# Patient Record
Sex: Female | Born: 1950 | Race: Black or African American | Hispanic: No | Marital: Married | Smoking: Former smoker
Health system: Southern US, Community
[De-identification: ages and names within clinical notes are randomized; demographics above are authoritative.]

## PROBLEM LIST (undated history)

## (undated) DIAGNOSIS — J40 Bronchitis, not specified as acute or chronic: Secondary | ICD-10-CM

## (undated) DIAGNOSIS — C50919 Malignant neoplasm of unspecified site of unspecified female breast: Secondary | ICD-10-CM

## (undated) DIAGNOSIS — IMO0002 Reserved for concepts with insufficient information to code with codable children: Secondary | ICD-10-CM

## (undated) DIAGNOSIS — E119 Type 2 diabetes mellitus without complications: Secondary | ICD-10-CM

## (undated) DIAGNOSIS — G4730 Sleep apnea, unspecified: Secondary | ICD-10-CM

## (undated) DIAGNOSIS — K219 Gastro-esophageal reflux disease without esophagitis: Secondary | ICD-10-CM

## (undated) DIAGNOSIS — M1990 Unspecified osteoarthritis, unspecified site: Secondary | ICD-10-CM

## (undated) DIAGNOSIS — M8580 Other specified disorders of bone density and structure, unspecified site: Secondary | ICD-10-CM

## (undated) DIAGNOSIS — T7840XA Allergy, unspecified, initial encounter: Secondary | ICD-10-CM

## (undated) DIAGNOSIS — K449 Diaphragmatic hernia without obstruction or gangrene: Secondary | ICD-10-CM

## (undated) DIAGNOSIS — R011 Cardiac murmur, unspecified: Secondary | ICD-10-CM

## (undated) DIAGNOSIS — J189 Pneumonia, unspecified organism: Secondary | ICD-10-CM

## (undated) DIAGNOSIS — I509 Heart failure, unspecified: Secondary | ICD-10-CM

## (undated) DIAGNOSIS — I427 Cardiomyopathy due to drug and external agent: Secondary | ICD-10-CM

## (undated) DIAGNOSIS — C801 Malignant (primary) neoplasm, unspecified: Secondary | ICD-10-CM

## (undated) DIAGNOSIS — T451X5A Adverse effect of antineoplastic and immunosuppressive drugs, initial encounter: Secondary | ICD-10-CM

## (undated) DIAGNOSIS — J349 Unspecified disorder of nose and nasal sinuses: Secondary | ICD-10-CM

## (undated) DIAGNOSIS — C50911 Malignant neoplasm of unspecified site of right female breast: Secondary | ICD-10-CM

## (undated) HISTORY — DX: Diaphragmatic hernia without obstruction or gangrene: K44.9

## (undated) HISTORY — DX: Heart failure, unspecified: I50.9

## (undated) HISTORY — DX: Allergy, unspecified, initial encounter: T78.40XA

## (undated) HISTORY — DX: Reserved for concepts with insufficient information to code with codable children: IMO0002

## (undated) HISTORY — DX: Malignant neoplasm of unspecified site of unspecified female breast: C50.919

## (undated) HISTORY — PX: GASTRIC BYPASS: SHX52

## (undated) HISTORY — PX: JOINT REPLACEMENT: SHX530

## (undated) HISTORY — DX: Malignant neoplasm of unspecified site of right female breast: C50.911

## (undated) HISTORY — DX: Other specified disorders of bone density and structure, unspecified site: M85.80

## (undated) HISTORY — DX: Pneumonia, unspecified organism: J18.9

## (undated) HISTORY — DX: Cardiac murmur, unspecified: R01.1

## (undated) HISTORY — DX: Bronchitis, not specified as acute or chronic: J40

## (undated) HISTORY — PX: BUNIONECTOMY: SHX129

## (undated) HISTORY — PX: FOOT SURGERY: SHX648

## (undated) HISTORY — DX: Malignant (primary) neoplasm, unspecified: C80.1

## (undated) HISTORY — PX: TONSILLECTOMY: SUR1361

## (undated) HISTORY — PX: BREAST SURGERY: SHX581

## (undated) HISTORY — DX: Type 2 diabetes mellitus without complications: E11.9

## (undated) HISTORY — DX: Unspecified disorder of nose and nasal sinuses: J34.9

---

## 1997-02-17 HISTORY — PX: OTHER SURGICAL HISTORY: SHX169

## 1997-09-04 ENCOUNTER — Ambulatory Visit (HOSPITAL_COMMUNITY): Admission: RE | Admit: 1997-09-04 | Discharge: 1997-09-04 | Payer: Self-pay | Admitting: *Deleted

## 1998-12-04 ENCOUNTER — Encounter: Payer: Self-pay | Admitting: *Deleted

## 1998-12-04 ENCOUNTER — Ambulatory Visit (HOSPITAL_COMMUNITY): Admission: RE | Admit: 1998-12-04 | Discharge: 1998-12-04 | Payer: Self-pay | Admitting: *Deleted

## 2001-10-01 ENCOUNTER — Other Ambulatory Visit: Admission: RE | Admit: 2001-10-01 | Discharge: 2001-10-01 | Payer: Self-pay | Admitting: Obstetrics and Gynecology

## 2001-10-07 ENCOUNTER — Ambulatory Visit (HOSPITAL_COMMUNITY): Admission: RE | Admit: 2001-10-07 | Discharge: 2001-10-07 | Payer: Self-pay | Admitting: Obstetrics and Gynecology

## 2001-10-07 ENCOUNTER — Encounter: Payer: Self-pay | Admitting: Obstetrics and Gynecology

## 2001-10-13 ENCOUNTER — Encounter: Payer: Self-pay | Admitting: Obstetrics and Gynecology

## 2001-10-13 ENCOUNTER — Encounter: Admission: RE | Admit: 2001-10-13 | Discharge: 2001-10-13 | Payer: Self-pay | Admitting: Obstetrics and Gynecology

## 2001-12-01 ENCOUNTER — Encounter (INDEPENDENT_AMBULATORY_CARE_PROVIDER_SITE_OTHER): Payer: Self-pay | Admitting: Specialist

## 2001-12-01 ENCOUNTER — Ambulatory Visit (HOSPITAL_COMMUNITY): Admission: RE | Admit: 2001-12-01 | Discharge: 2001-12-01 | Payer: Self-pay | Admitting: Gastroenterology

## 2002-07-06 ENCOUNTER — Encounter: Admission: RE | Admit: 2002-07-06 | Discharge: 2002-10-04 | Payer: Self-pay | Admitting: Nurse Practitioner

## 2002-10-05 ENCOUNTER — Encounter: Admission: RE | Admit: 2002-10-05 | Discharge: 2003-01-03 | Payer: Self-pay | Admitting: Nurse Practitioner

## 2003-01-20 ENCOUNTER — Encounter: Admission: RE | Admit: 2003-01-20 | Discharge: 2003-04-20 | Payer: Self-pay | Admitting: *Deleted

## 2003-01-26 ENCOUNTER — Ambulatory Visit (HOSPITAL_COMMUNITY): Admission: RE | Admit: 2003-01-26 | Discharge: 2003-01-26 | Payer: Self-pay | Admitting: Obstetrics and Gynecology

## 2003-02-07 ENCOUNTER — Other Ambulatory Visit: Admission: RE | Admit: 2003-02-07 | Discharge: 2003-02-07 | Payer: Self-pay | Admitting: Obstetrics and Gynecology

## 2004-02-07 ENCOUNTER — Other Ambulatory Visit: Admission: RE | Admit: 2004-02-07 | Discharge: 2004-02-07 | Payer: Self-pay | Admitting: Obstetrics and Gynecology

## 2004-02-07 ENCOUNTER — Ambulatory Visit (HOSPITAL_COMMUNITY): Admission: RE | Admit: 2004-02-07 | Discharge: 2004-02-07 | Payer: Self-pay | Admitting: Obstetrics and Gynecology

## 2004-02-20 ENCOUNTER — Ambulatory Visit (HOSPITAL_COMMUNITY): Admission: RE | Admit: 2004-02-20 | Discharge: 2004-02-20 | Payer: Self-pay | Admitting: Obstetrics and Gynecology

## 2005-02-26 ENCOUNTER — Other Ambulatory Visit: Admission: RE | Admit: 2005-02-26 | Discharge: 2005-02-26 | Payer: Self-pay | Admitting: Obstetrics and Gynecology

## 2005-03-04 ENCOUNTER — Ambulatory Visit (HOSPITAL_COMMUNITY): Admission: RE | Admit: 2005-03-04 | Discharge: 2005-03-04 | Payer: Self-pay | Admitting: Obstetrics and Gynecology

## 2006-04-01 ENCOUNTER — Ambulatory Visit (HOSPITAL_COMMUNITY): Admission: RE | Admit: 2006-04-01 | Discharge: 2006-04-01 | Payer: Self-pay | Admitting: Obstetrics and Gynecology

## 2006-06-01 ENCOUNTER — Ambulatory Visit (HOSPITAL_COMMUNITY): Admission: RE | Admit: 2006-06-01 | Discharge: 2006-06-01 | Payer: Self-pay | Admitting: Obstetrics and Gynecology

## 2006-07-16 ENCOUNTER — Encounter: Admission: RE | Admit: 2006-07-16 | Discharge: 2006-07-16 | Payer: Self-pay | Admitting: Internal Medicine

## 2006-08-25 ENCOUNTER — Encounter: Admission: RE | Admit: 2006-08-25 | Discharge: 2006-08-25 | Payer: Self-pay | Admitting: Orthopedic Surgery

## 2006-09-04 ENCOUNTER — Encounter: Admission: RE | Admit: 2006-09-04 | Discharge: 2006-09-04 | Payer: Self-pay | Admitting: Orthopedic Surgery

## 2006-10-01 ENCOUNTER — Encounter: Admission: RE | Admit: 2006-10-01 | Discharge: 2006-11-25 | Payer: Self-pay | Admitting: Orthopedic Surgery

## 2007-04-06 ENCOUNTER — Ambulatory Visit (HOSPITAL_COMMUNITY): Admission: RE | Admit: 2007-04-06 | Discharge: 2007-04-06 | Payer: Self-pay | Admitting: Obstetrics and Gynecology

## 2008-03-22 ENCOUNTER — Encounter: Admission: RE | Admit: 2008-03-22 | Discharge: 2008-03-22 | Payer: Self-pay | Admitting: Internal Medicine

## 2008-05-02 ENCOUNTER — Ambulatory Visit (HOSPITAL_COMMUNITY): Admission: RE | Admit: 2008-05-02 | Discharge: 2008-05-02 | Payer: Self-pay | Admitting: Obstetrics and Gynecology

## 2009-05-08 ENCOUNTER — Ambulatory Visit (HOSPITAL_COMMUNITY): Admission: RE | Admit: 2009-05-08 | Discharge: 2009-05-08 | Payer: Self-pay | Admitting: Obstetrics and Gynecology

## 2010-04-12 ENCOUNTER — Other Ambulatory Visit (HOSPITAL_COMMUNITY): Payer: Self-pay | Admitting: Internal Medicine

## 2010-04-12 DIAGNOSIS — Z1231 Encounter for screening mammogram for malignant neoplasm of breast: Secondary | ICD-10-CM

## 2010-04-16 ENCOUNTER — Emergency Department (HOSPITAL_COMMUNITY): Payer: BC Managed Care – PPO

## 2010-04-16 ENCOUNTER — Inpatient Hospital Stay (HOSPITAL_COMMUNITY)
Admission: EM | Admit: 2010-04-16 | Discharge: 2010-04-25 | DRG: 170 | Disposition: A | Discharge status: Home Health | Payer: BC Managed Care – PPO | Source: Ambulatory Visit | Attending: Surgery | Admitting: Surgery

## 2010-04-16 ENCOUNTER — Inpatient Hospital Stay (HOSPITAL_COMMUNITY): Payer: BC Managed Care – PPO

## 2010-04-16 DIAGNOSIS — E119 Type 2 diabetes mellitus without complications: Secondary | ICD-10-CM | POA: Diagnosis present

## 2010-04-16 DIAGNOSIS — I959 Hypotension, unspecified: Secondary | ICD-10-CM | POA: Diagnosis present

## 2010-04-16 DIAGNOSIS — K560 Paralytic ileus: Secondary | ICD-10-CM | POA: Diagnosis present

## 2010-04-16 DIAGNOSIS — E785 Hyperlipidemia, unspecified: Secondary | ICD-10-CM | POA: Diagnosis present

## 2010-04-16 DIAGNOSIS — I10 Essential (primary) hypertension: Secondary | ICD-10-CM | POA: Diagnosis present

## 2010-04-16 DIAGNOSIS — K275 Chronic or unspecified peptic ulcer, site unspecified, with perforation: Principal | ICD-10-CM | POA: Diagnosis present

## 2010-04-16 DIAGNOSIS — E669 Obesity, unspecified: Secondary | ICD-10-CM | POA: Diagnosis present

## 2010-04-16 LAB — POCT I-STAT 7, (LYTES, BLD GAS, ICA,H+H)
Acid-base deficit: 1 mmol/L (ref 0.0–2.0)
Bicarbonate: 24.7 mEq/L — ABNORMAL HIGH (ref 20.0–24.0)
Calcium, Ion: 1.12 mmol/L (ref 1.12–1.32)
HCT: 37 % (ref 36.0–46.0)
Hemoglobin: 12.6 g/dL (ref 12.0–15.0)
O2 Saturation: 100 %
Potassium: 3.2 mEq/L — ABNORMAL LOW (ref 3.5–5.1)
Sodium: 142 mEq/L (ref 135–145)
TCO2: 26 mmol/L (ref 0–100)
pCO2 arterial: 46.2 mmHg — ABNORMAL HIGH (ref 35.0–45.0)
pH, Arterial: 7.336 — ABNORMAL LOW (ref 7.350–7.400)
pO2, Arterial: 433 mmHg — ABNORMAL HIGH (ref 80.0–100.0)

## 2010-04-16 LAB — DIFFERENTIAL
Basophils Absolute: 0 10*3/uL (ref 0.0–0.1)
Basophils Relative: 0 % (ref 0–1)
Eosinophils Absolute: 0.1 10*3/uL (ref 0.0–0.7)
Eosinophils Relative: 1 % (ref 0–5)
Lymphocytes Relative: 27 % (ref 12–46)
Lymphs Abs: 2.1 10*3/uL (ref 0.7–4.0)
Monocytes Absolute: 0.3 10*3/uL (ref 0.1–1.0)
Monocytes Relative: 4 % (ref 3–12)
Neutro Abs: 5.3 10*3/uL (ref 1.7–7.7)
Neutrophils Relative %: 68 % (ref 43–77)

## 2010-04-16 LAB — CBC
HCT: 46 % (ref 36.0–46.0)
HCT: 40.6 % (ref 36.0–46.0)
Hemoglobin: 15.5 g/dL — ABNORMAL HIGH (ref 12.0–15.0)
Hemoglobin: 14 g/dL (ref 12.0–15.0)
MCH: 30.3 pg (ref 26.0–34.0)
MCH: 30.3 pg (ref 26.0–34.0)
MCHC: 33.7 g/dL (ref 30.0–36.0)
MCHC: 34.5 g/dL (ref 30.0–36.0)
MCV: 90 fL (ref 78.0–100.0)
MCV: 87.9 fL (ref 78.0–100.0)
Platelets: 272 10*3/uL (ref 150–400)
Platelets: 205 10*3/uL (ref 150–400)
RBC: 5.11 MIL/uL (ref 3.87–5.11)
RBC: 4.62 MIL/uL (ref 3.87–5.11)
RDW: 12.5 % (ref 11.5–15.5)
RDW: 12.8 % (ref 11.5–15.5)
WBC: 7.8 10*3/uL (ref 4.0–10.5)
WBC: 6.7 10*3/uL (ref 4.0–10.5)

## 2010-04-16 LAB — GLUCOSE, CAPILLARY
Glucose-Capillary: 196 mg/dL — ABNORMAL HIGH (ref 70–99)
Glucose-Capillary: 94 mg/dL (ref 70–99)
Glucose-Capillary: 185 mg/dL — ABNORMAL HIGH (ref 70–99)
Glucose-Capillary: 165 mg/dL — ABNORMAL HIGH (ref 70–99)

## 2010-04-16 LAB — POCT CARDIAC MARKERS
CKMB, poc: <1 ng/mL — ABNORMAL LOW (ref 1.0–8.0)
Myoglobin, poc: 28.4 ng/mL (ref 12–200)
Troponin i, poc: <0.05 ng/mL (ref 0.00–0.09)

## 2010-04-16 LAB — BASIC METABOLIC PANEL
BUN: 9 mg/dL (ref 6–23)
CO2: 23 mEq/L (ref 19–32)
Calcium: 7.4 mg/dL — ABNORMAL LOW (ref 8.4–10.5)
Chloride: 113 mEq/L — ABNORMAL HIGH (ref 96–112)
Creatinine, Ser: 0.49 mg/dL (ref 0.4–1.2)
GFR calc Af Amer: >60 mL/min (ref >60)
GFR calc non Af Amer: >60 mL/min (ref >60)
Glucose, Bld: 146 mg/dL — ABNORMAL HIGH (ref 70–99)
Potassium: 3.7 mEq/L (ref 3.5–5.1)
Sodium: 139 mEq/L (ref 135–145)

## 2010-04-16 LAB — MRSA PCR SCREENING: MRSA by PCR: NEGATIVE

## 2010-04-16 LAB — COMPREHENSIVE METABOLIC PANEL
ALT: 26 U/L (ref 0–35)
AST: 26 U/L (ref 0–37)
Albumin: 3.2 g/dL — ABNORMAL LOW (ref 3.5–5.2)
Alkaline Phosphatase: 75 U/L (ref 39–117)
BUN: 12 mg/dL (ref 6–23)
CO2: 30 mEq/L (ref 19–32)
Calcium: 8.7 mg/dL (ref 8.4–10.5)
Chloride: 109 mEq/L (ref 96–112)
Creatinine, Ser: 0.77 mg/dL (ref 0.4–1.2)
GFR calc Af Amer: >60 mL/min (ref >60)
GFR calc non Af Amer: >60 mL/min (ref >60)
Glucose, Bld: 197 mg/dL — ABNORMAL HIGH (ref 70–99)
Potassium: 4.4 mEq/L (ref 3.5–5.1)
Sodium: 146 mEq/L — ABNORMAL HIGH (ref 135–145)
Total Bilirubin: 0.5 mg/dL (ref 0.3–1.2)
Total Protein: 5.5 g/dL — ABNORMAL LOW (ref 6.0–8.3)

## 2010-04-16 LAB — LIPASE, BLOOD: Lipase: 23 U/L (ref 11–59)

## 2010-04-16 LAB — APTT: aPTT: 27 seconds (ref 24–37)

## 2010-04-16 LAB — ABO/RH: ABO/RH(D): A POS

## 2010-04-16 LAB — LACTIC ACID, PLASMA: Lactic Acid, Venous: 4.7 mmol/L — ABNORMAL HIGH (ref 0.5–2.2)

## 2010-04-16 LAB — PROTIME-INR
INR: 1.09 (ref 0.00–1.49)
Prothrombin Time: 14.3 seconds (ref 11.6–15.2)

## 2010-04-17 LAB — BASIC METABOLIC PANEL
BUN: 7 mg/dL (ref 6–23)
CO2: 28 mEq/L (ref 19–32)
Calcium: 7.9 mg/dL — ABNORMAL LOW (ref 8.4–10.5)
Chloride: 107 mEq/L (ref 96–112)
Creatinine, Ser: 0.5 mg/dL (ref 0.4–1.2)
GFR calc Af Amer: >60 mL/min (ref >60)
GFR calc non Af Amer: >60 mL/min (ref >60)
Glucose, Bld: 140 mg/dL — ABNORMAL HIGH (ref 70–99)
Potassium: 3.3 mEq/L — ABNORMAL LOW (ref 3.5–5.1)
Sodium: 140 mEq/L (ref 135–145)

## 2010-04-17 LAB — HEMOGLOBIN A1C
Hgb A1c MFr Bld: 5.9 % — ABNORMAL HIGH (ref <5.7)
Mean Plasma Glucose: 123 mg/dL — ABNORMAL HIGH (ref <117)

## 2010-04-17 LAB — GLUCOSE, CAPILLARY
Glucose-Capillary: 124 mg/dL — ABNORMAL HIGH (ref 70–99)
Glucose-Capillary: 128 mg/dL — ABNORMAL HIGH (ref 70–99)
Glucose-Capillary: 112 mg/dL — ABNORMAL HIGH (ref 70–99)
Glucose-Capillary: 99 mg/dL (ref 70–99)
Glucose-Capillary: 144 mg/dL — ABNORMAL HIGH (ref 70–99)
Glucose-Capillary: 84 mg/dL (ref 70–99)

## 2010-04-17 LAB — CBC
HCT: 38.3 % (ref 36.0–46.0)
Hemoglobin: 13.2 g/dL (ref 12.0–15.0)
MCH: 30.6 pg (ref 26.0–34.0)
MCHC: 34.5 g/dL (ref 30.0–36.0)
MCV: 88.7 fL (ref 78.0–100.0)
Platelets: 209 10*3/uL (ref 150–400)
RBC: 4.32 MIL/uL (ref 3.87–5.11)
RDW: 13.3 % (ref 11.5–15.5)
WBC: 10.5 10*3/uL (ref 4.0–10.5)

## 2010-04-17 NOTE — Op Note (Signed)
NAME:  [name] [name], [name] [name]            ACCOUNT NO.:  [id number]  MEDICAL RECORD NO.:  [id number]           PATIENT TYPE:  I  LOCATION:  2550                         FACILITY:  [facility]  PHYSICIAN:  Angelia Mould. Derrell Lolling, M.D.DATE OF BIRTH:  12-Apr-1950  DATE OF PROCEDURE:  04/16/2010 DATE OF DISCHARGE:                              OPERATIVE REPORT   PREOPERATIVE DIAGNOSIS:  Pneumoperitoneum with peritonitis.  POSTOPERATIVE DIAGNOSES: 1. Perforated marginal ulcer with peritonitis. 2. 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 3. status post Roux-en-Y gastric bypass for morbid obesity  OPERATION PERFORMED: 1. Exploratory laparotomy with closure and omental patch of perforated     marginal ulcer. 2. A 22-French feeding gastrostomy (into gastric remnant). 3. Upper endoscopy (by Dr. Ovidio Kin).  SURGEON:  Angelia Mould. Derrell Lolling, MD.  FIRST ASSISTANT:  Almond Lint, MD.  OPERATIVE INDICATIONS:  This is a 60 year old African American female who has a history of a Roux-en-Y gastric bypass for morbid obesity about 2 years ago by Dr. Leona Carry at [facility].  She has done well with that surgery.  She has had one or two endoscopies with dilatation of the anastomosis according to the husband, but she has not really had any recent vomiting.  She became acutely ill this morning with a sudden onset of left-sided abdominal pain and came to the emergency room in extreme distress from pain and was diaphoretic.  She was initially stable; but despite fluid resuscitation, she became somewhat hypotensive and it was thought that this was possibly due to impending sepsis.  The CT scan was done which showed a pneumoperitoneum, moderate amount of air but did not specifically document the site of perforation as noted and oral contrast was given.  I was called at this point in time and the patient was taken emergently to the operating room.  OPERATIVE FINDINGS:  The patient had a perforation on the  anterior wall of the gastrojejunostomy.  This was stuck up onto the liver and it required dissection off of the undersurface of the left lobe of the liver very carefully.  We did have some injury to the capsule of the liver causing bleeding, but we ultimately had that controlled quite nicely.  I was able to pass a 24-French red rubber catheter through the opening up into the gastric pouch and then down into the jejunal limb. There was a lot of cloudy, dirty looking fluid throughout the abdomen and pelvis which required about 8 or 10 L of irrigation to clean up. Dr. Ezzard Standing performed an upper endoscopy, which will be dictated separately.  He felt that there was probably a stricture at the gastric jejunal anastomosis, perhaps just proximal to the perforation.  I could not appreciate this from the abdominal side.  Because of the stricture, I felt that it was prudent to put in a feeding gastrostomy in the gastric remnant.  We did not feel that she was stable enough to consider complete revision of the gastrojejunostomy.  The liver otherwise looked healthy.  The gallbladder was distended but looked healthy.  The small bowel looked healthy.  There was no evidence of any internal hernia. The jejunojejunostomy looked healthy.  OPERATIVE TECHNIQUE:  Following induction of general endotracheal anesthesia, multiple IV and arterial lines were placed by Dr. Sampson Goon. The abdomen was prepped and draped in sterile fashion.  Surgical time- out was held identifying correct patient and correct procedure.  Upper midline laparotomy incision was made.  The abdomen was entered and explored with findings as described above.  Self-retaining retractor were placed.  Aerobic and anaerobic cultures were taken of the abdominal fluid.  We first identified the jejunojejunostomy that looked fine.  I then went up on the Roux-en-Y limb and found the perforation on the anterior surface of the gastrojejunostomy.  This  was stuck to the undersurface of the left lobe of the liver just above the perforation; and using scissor dissection and cautery dissection, I had to slowly take this down.  In an effort to avoid injury to the GI tract, I did enter the capsule of liver some, this was controlled with cautery, Vicryl suture, and ultimately Tisseel.  There was no bleeding at the end of the case.  After irrigating the abdomen somewhat, I then closed the perforation transversely with interrupted sutures of 2-0 silk.  Dr. Ezzard Standing came in to the operating room to perform an upper endoscopy.  He insufflated this under air with occlusion of the jejunal limb.  We did have a couple of air bubbles in a couple of areas, so we put a couple of more sutures and that seemed to control things quite nicely.  We very carefully placed some sutures back in the angle to the right of the gastrojejunal anastomosis.  We had good airtight seal at this point.  After discussing her care, we elected to go ahead with a feeding gastrostomy.  We could find the gastric remnant coming up behind the anastomosis on the left upper quadrant.  We carefully identified this. We pulled the transverse colon and splenic flexure down and away from this.  We brought a 22-French Foley catheter through the left upper quadrant abdominal wall and placed it into the gastric lumen, and I placed two concentric pursestring sutures of 2-0 silk.  We then blew up the Foley balloon and it worked fine.  It had been tested prior to insertion.  We then tacked the stomach up to the abdominal wall with about 6 interrupted sutures of 3-0 silk.  We then pulled the balloon up and then sewed the Foley catheter to the skin with a #1 silk suture.  We flushed the catheter and it flushed easily.  It was occluded and then later connected to gravity drainage bag at the end of the case.  We further irrigated the subphrenic spaces, subhepatic spaces, abdomen and pelvis with a  few more liters of saline.  We then checked the liver and there was no bleeding.  We placed Tisseel all around the anastomosis and let that harden and after it had hardened, we then took a tongue of omentum and placed it over the repair and tacked it in place with several interrupted sutures of 3-0 silk.  We took two 19-French Blake drains and brought them to the operative field.  There were brought out through stab incisions in the right upper quadrant.  The upper drain was placed to the right of the repair and down into the recess behind that. The lower drain was placed under the liver and across the repair anteriorly and extended to the left a little bit.  These were sutured to the skin with nylon sutures connected to suction bulbs.  After checking one more time, we found no leak and no bleeding.  The colon and omentum returned to their anatomic positions.  The midline fascia was closed with a running suture of #1 double-stranded PDS.  The skin was closed loosely with skin staples placing Telfa wicks in-between.  Clean bandages were placed.  The patient was taken to recovery room in stable condition.  ESTIMATED BLOOD LOSS:  About 300 mL.  COMPLICATIONS:  None.  Sponge, needle, and instruments counts were correct.     Angelia Mould. Derrell Lolling, M.D. HMI/MEDQ  D:  04/16/2010  T:  04/17/2010  Job:  [id number]  cc:   Sharlot Gowda, M.D. Leona Carry  Electronically Signed by Claud Kelp M.D. on 04/17/2010 01:26:47 PM

## 2010-04-18 LAB — GLUCOSE, CAPILLARY
Glucose-Capillary: 105 mg/dL — ABNORMAL HIGH (ref 70–99)
Glucose-Capillary: 119 mg/dL — ABNORMAL HIGH (ref 70–99)
Glucose-Capillary: 133 mg/dL — ABNORMAL HIGH (ref 70–99)
Glucose-Capillary: 135 mg/dL — ABNORMAL HIGH (ref 70–99)
Glucose-Capillary: 150 mg/dL — ABNORMAL HIGH (ref 70–99)
Glucose-Capillary: 152 mg/dL — ABNORMAL HIGH (ref 70–99)
Glucose-Capillary: 442 mg/dL — ABNORMAL HIGH (ref 70–99)

## 2010-04-18 LAB — BASIC METABOLIC PANEL
BUN: 4 mg/dL — ABNORMAL LOW (ref 6–23)
CO2: 30 mEq/L (ref 19–32)
Calcium: 8.5 mg/dL (ref 8.4–10.5)
Chloride: 102 mEq/L (ref 96–112)
Creatinine, Ser: 0.54 mg/dL (ref 0.4–1.2)
GFR calc Af Amer: >60 mL/min (ref >60)
GFR calc non Af Amer: >60 mL/min (ref >60)
Glucose, Bld: 121 mg/dL — ABNORMAL HIGH (ref 70–99)
Potassium: 3.6 mEq/L (ref 3.5–5.1)
Sodium: 139 mEq/L (ref 135–145)

## 2010-04-18 LAB — CBC
HCT: 36.5 % (ref 36.0–46.0)
Hemoglobin: 12.4 g/dL (ref 12.0–15.0)
MCH: 30.7 pg (ref 26.0–34.0)
MCHC: 34 g/dL (ref 30.0–36.0)
MCV: 90.3 fL (ref 78.0–100.0)
Platelets: 202 10*3/uL (ref 150–400)
RBC: 4.04 MIL/uL (ref 3.87–5.11)
RDW: 13.5 % (ref 11.5–15.5)
WBC: 12 10*3/uL — ABNORMAL HIGH (ref 4.0–10.5)

## 2010-04-18 LAB — MRSA PCR SCREENING: MRSA by PCR: NEGATIVE

## 2010-04-19 ENCOUNTER — Inpatient Hospital Stay: Admit: 2010-04-19 | Payer: Self-pay | Admitting: Internal Medicine

## 2010-04-19 LAB — CBC
HCT: 33.9 % — ABNORMAL LOW (ref 36.0–46.0)
Hemoglobin: 11.3 g/dL — ABNORMAL LOW (ref 12.0–15.0)
MCH: 29.8 pg (ref 26.0–34.0)
MCHC: 33.3 g/dL (ref 30.0–36.0)
MCV: 89.4 fL (ref 78.0–100.0)
Platelets: 208 10*3/uL (ref 150–400)
RBC: 3.79 MIL/uL — ABNORMAL LOW (ref 3.87–5.11)
RDW: 12.9 % (ref 11.5–15.5)
WBC: 9.1 10*3/uL (ref 4.0–10.5)

## 2010-04-19 LAB — TYPE AND SCREEN
ABO/RH(D): A POS
Antibody Screen: NEGATIVE
Unit division: 0
Unit division: 0
Unit division: 0
Unit division: 0
Unit division: 0
Unit division: 0

## 2010-04-19 LAB — PREALBUMIN: Prealbumin: 9.7 mg/dL — ABNORMAL LOW (ref 17.0–34.0)

## 2010-04-19 LAB — BASIC METABOLIC PANEL
BUN: 4 mg/dL — ABNORMAL LOW (ref 6–23)
CO2: 31 mEq/L (ref 19–32)
Calcium: 8.4 mg/dL (ref 8.4–10.5)
Chloride: 104 mEq/L (ref 96–112)
Creatinine, Ser: 0.51 mg/dL (ref 0.4–1.2)
GFR calc Af Amer: >60 mL/min (ref >60)
GFR calc non Af Amer: >60 mL/min (ref >60)
Glucose, Bld: 140 mg/dL — ABNORMAL HIGH (ref 70–99)
Potassium: 3.6 mEq/L (ref 3.5–5.1)
Sodium: 138 mEq/L (ref 135–145)

## 2010-04-19 LAB — GLUCOSE, CAPILLARY
Glucose-Capillary: 140 mg/dL — ABNORMAL HIGH (ref 70–99)
Glucose-Capillary: 121 mg/dL — ABNORMAL HIGH (ref 70–99)
Glucose-Capillary: 102 mg/dL — ABNORMAL HIGH (ref 70–99)

## 2010-04-19 NOTE — H&P (Signed)
NAME:  [name] [name]            ACCOUNT NO.:  [id number]  MEDICAL RECORD NO.:  [id number]           PATIENT TYPE:  I  LOCATION:  2106                         FACILITY:  [facility]  PHYSICIAN:  [name] [name], M.D.DATE OF BIRTH:  1950/08/18  DATE OF ADMISSION:  04/16/2010 DATE OF DISCHARGE:                             HISTORY & PHYSICAL   CHIEF COMPLAINT:  Abdominal pain.  HISTORY OF PRESENT ILLNESS:  The patient is a 60 year old African American female who woke up early this morning, not feeling well, went to use the bathroom, and essentially doubled over with complaint of abdominal pain.  This was associated by feeling of weakness and lethargy which had been present although to a lesser degree over the past couple of days.  She has not had any vomiting though she has had some nausea. She has not had any change in her bowel movements, her bowel movements were normal yesterday.  No hematochezia or melena.  She denies any trauma or recent travel history or abnormal ingestions.  Of note, she does have a history of Roux-en-Y gastric bypass in 2010, at [facility], and she had subsequent dilatations of her anastomosis, most recently approximately 1 year ago.  She has otherwise done very well, losing a significant amount of weight, and has tolerated the procedure well postoperatively.  She, however, presented to the emergency department with complaint of this sudden abdominal pain with weakness and diaphoresis.  She was immediately sent to the CT scan for concern of ruptured aorta or aortic aneurysm.  However, the CT scan showed no evidence of aortic rupture but showed evidence of intraabdominal free air, suspicious for perforated viscus, predominately in the upper abdomen.  The patient denies any known history of peptic ulcer disease, states there was no mention of any gastritis or esophagitis on her most recent endoscopy done at [facility].  She has also had recent lower  endoscopy by Dr. Charna [name] in 2003, and again as well as in last year.  She has no known history of diverticulum of note.  We have been asked to see this patient in the emergency department for perforated viscus.  PAST MEDICAL HISTORY:  Consistent with diabetes, hypertension, hyperlipidemia, obesity.  PAST SURGICAL HISTORY:  Consistent with Roux-en-Y gastric bypass at [facility].  She has also had a C-section.  FAMILY HISTORY:  Noncontributory to the present case.  SOCIAL HISTORY:  The patient denies any tobacco or alcohol use.  She is married and lives here in town.  DRUG ALLERGIES:  Include PENICILLIN and DARVOCET.  CURRENT MEDICATION:  List is incomplete at present time but it includes Crestor, aspirin 81 mg daily, and diabetes medications which the husband did not know the name of currently.  REVIEW OF SYSTEMS:  Please see history of present illness for pertinent findings.  PHYSICAL EXAMINATION:  GENERAL:  Reveals a 60 year old African American female who is pale and diaphoretic and certainly appears ill, but nontoxic. VITAL SIGNS:  She has a temperature of 97.3, heart rate stable in the 80s, respiratory rate of 20, blood pressure initially 100/57, currently dropped to 78/60. HEENT:  Unremarkable except pale  mucosa and conjunctivae. NECK:  Supple without lymphadenopathy. CHEST:  The patient's lungs are clear.  No wheezes, rhonchi, or rales. The patient has mildly labored breathing. HEART:  Regular rate and rhythm.  No murmurs, gallops, or rubs. Carotids are 2+ and brisk without bruit. ABDOMEN:  Tender, firm but not rigid.  No mass effect, hernias, or organomegaly is appreciated.  The patient is primarily tender in the left upper quadrant, midepigastric. RECTAL:  Deferred. GENITOURINARY:  Deferred. EXTREMITIES:  Good active range of motion without crepitus.  Normal muscle strength and tone. SKIN:  Otherwise pale, cool to touch, and clammy.  Capillary  refill though is brisk. NEUROLOGIC:  The patient is alert and oriented x3.  DIAGNOSTICS:  Labs to this point show white blood cell count of 7.8, hemoglobin of 15.8, hematocrit of 46, platelet count of 272.  CT scan again shows evidence of moderate amount of free intraperitoneal air with edema at the gastrojejunostomy site, suspicious for perforation of ulceration at that site.  There is a minor amount of free fluid throughout the peritoneal cavity, but is not suspicious for bleeding at this time.  IMPRESSION: 1. Acute abdominal pain secondary to perforated viscus. 2. Likely perforated peptic ulcer disease.  PLAN:  We will admit the patient.  She is to get aggressive IV fluid resuscitation while in the emergency room.  We will check her coags and additional labs and plan for operative intervention.  The need for surgery including the procedure risks, postoperative complications, and expectations was discussed at length with the husband and other family members who were present.     [name] El, PA-C   ______________________________ [name] [name], M.D.    KB/MEDQ  D:  04/16/2010  T:  04/16/2010  Job:  [id number]  Electronically Signed by [name] [name]  on 04/17/2010 02:23:15 PM Electronically Signed by Claud Kelp M.D. on 04/19/2010 09:38:40 AM

## 2010-04-19 NOTE — Op Note (Signed)
  NAME:  [name], [name]            ACCOUNT NO.:  [id number]  MEDICAL RECORD NO.:  [id number]           PATIENT TYPE:  I  LOCATION:  2106                         FACILITY:  [facility]  PHYSICIAN:  Sandria Bales. Ezzard Standing, M.D.  DATE OF BIRTH:  Dec 26, 1950  DATE OF PROCEDURE:  04/16/2010                              OPERATIVE REPORT   PREOPERATIVE DIAGNOSIS:  Pneumoperitoneum, history of Roux-en-Y gastric bypass (performed at [facility]).  PREOPERATIVE DIAGNOSIS:  Marginal ulcer at gastrojejunostomy with perforation.  Gastrojejunal stenosis/stricture.  PROCEDURE:  Esophagogastroscopy.  SURGEON:  Sandria Bales. Ezzard Standing, MD  ASSISTANT:  No first assistant.  ANESTHESIA:  General endotracheal.  ESTIMATED BLOOD LOSS:  None.  INDICATIONS FOR PROCEDURE:  [name] is a 60 year old female who had a Roux-en-Y gastric bypass by Dr. Leona Carry at [facility] approximately 2 years ago.  She apparently has been endoscoped  once or twice since the surgery for a stricture at her gastrojejunostomy.  It is unclear whether she has undergone a dilatation or been treated for ulcer disease, but she presented acutely with abdominal pain to the [facility] emergency room.  She was evaluated by my partner Dr. Claud Kelp for a pneumoperitoneum.  Dr. Derrell Lolling is doing an open laparotomy and found a perforation at the gastrojejunostomy.  I am doing an upper endoscopy to evaluate the size of the pouch, the gastrojejunostomy, and the anatomy.  OPERATIVE NOTE: Dr. Derrell Lolling was clamped off the small bowel and I passed the scope down into this gastric pouch. I identified the Z-line at about 40 cm from the incisors. The gastrojejunal anastomosis at about 47 cm from the incisors for a 7-cm pouch.  The anastomosis is small, probably 6 or 7 mm in diameter.  This is consistent with a stricture at the anastomosis.  Dr. Derrell Lolling saw some bubbling while I was doing the endoscopy, so he oversewed the perforation to where there was no  bubbling.    Though she has a stenosis, this is not the time to try to dilate the gastrojejunostomy.  The mucosa of the stomach looked okay.  The pouch diameter looks okay, through the pouch length is about 7-cm pouch.    I then took photos which were placed in the chart.  I withdrew the endoscope into the esophagus, which was unremarkable.  The patient tolerated my portion of the operation.  Dr. Derrell Lolling will dictate the laparotomy portion of the operation.   Sandria Bales. Ezzard Standing, M.D., FACS   DHN/MEDQ  D:  04/16/2010  T:  04/17/2010  Job:  [id number]  cc:   Angelia Mould. Derrell Lolling, M.D. Leona Carry  Electronically Signed by Ovidio Kin M.D. on 04/19/2010 09:32:31 PM

## 2010-04-20 LAB — BODY FLUID CULTURE

## 2010-04-20 LAB — GLUCOSE, CAPILLARY
Glucose-Capillary: 102 mg/dL — ABNORMAL HIGH (ref 70–99)
Glucose-Capillary: 106 mg/dL — ABNORMAL HIGH (ref 70–99)
Glucose-Capillary: 109 mg/dL — ABNORMAL HIGH (ref 70–99)
Glucose-Capillary: 134 mg/dL — ABNORMAL HIGH (ref 70–99)
Glucose-Capillary: 141 mg/dL — ABNORMAL HIGH (ref 70–99)
Glucose-Capillary: 144 mg/dL — ABNORMAL HIGH (ref 70–99)
Glucose-Capillary: 148 mg/dL — ABNORMAL HIGH (ref 70–99)
Glucose-Capillary: 93 mg/dL (ref 70–99)
Glucose-Capillary: 99 mg/dL (ref 70–99)

## 2010-04-21 LAB — CBC
HCT: 34.6 % — ABNORMAL LOW (ref 36.0–46.0)
Hemoglobin: 11.9 g/dL — ABNORMAL LOW (ref 12.0–15.0)
MCH: 30.5 pg (ref 26.0–34.0)
MCHC: 34.4 g/dL (ref 30.0–36.0)
MCV: 88.7 fL (ref 78.0–100.0)
Platelets: 244 10*3/uL (ref 150–400)
RBC: 3.9 MIL/uL (ref 3.87–5.11)
RDW: 12.6 % (ref 11.5–15.5)
WBC: 6.2 10*3/uL (ref 4.0–10.5)

## 2010-04-21 LAB — GLUCOSE, CAPILLARY
Glucose-Capillary: 100 mg/dL — ABNORMAL HIGH (ref 70–99)
Glucose-Capillary: 107 mg/dL — ABNORMAL HIGH (ref 70–99)
Glucose-Capillary: 107 mg/dL — ABNORMAL HIGH (ref 70–99)
Glucose-Capillary: 114 mg/dL — ABNORMAL HIGH (ref 70–99)
Glucose-Capillary: 135 mg/dL — ABNORMAL HIGH (ref 70–99)
Glucose-Capillary: 136 mg/dL — ABNORMAL HIGH (ref 70–99)
Glucose-Capillary: 141 mg/dL — ABNORMAL HIGH (ref 70–99)

## 2010-04-21 LAB — ANAEROBIC CULTURE

## 2010-04-21 LAB — BASIC METABOLIC PANEL
BUN: 2 mg/dL — ABNORMAL LOW (ref 6–23)
CO2: 28 mEq/L (ref 19–32)
Calcium: 9 mg/dL (ref 8.4–10.5)
Chloride: 105 mEq/L (ref 96–112)
Creatinine, Ser: 0.5 mg/dL (ref 0.4–1.2)
GFR calc Af Amer: >60 mL/min (ref >60)
GFR calc non Af Amer: >60 mL/min (ref >60)
Glucose, Bld: 151 mg/dL — ABNORMAL HIGH (ref 70–99)
Potassium: 3.9 mEq/L (ref 3.5–5.1)
Sodium: 143 mEq/L (ref 135–145)

## 2010-04-21 LAB — PHOSPHORUS: Phosphorus: 2.5 mg/dL (ref 2.3–4.6)

## 2010-04-21 LAB — MAGNESIUM: Magnesium: 1.8 mg/dL (ref 1.5–2.5)

## 2010-04-22 ENCOUNTER — Inpatient Hospital Stay (HOSPITAL_COMMUNITY): Payer: BC Managed Care – PPO

## 2010-04-22 LAB — GLUCOSE, CAPILLARY
Glucose-Capillary: 109 mg/dL — ABNORMAL HIGH (ref 70–99)
Glucose-Capillary: 121 mg/dL — ABNORMAL HIGH (ref 70–99)
Glucose-Capillary: 123 mg/dL — ABNORMAL HIGH (ref 70–99)
Glucose-Capillary: 131 mg/dL — ABNORMAL HIGH (ref 70–99)
Glucose-Capillary: 143 mg/dL — ABNORMAL HIGH (ref 70–99)
Glucose-Capillary: 72 mg/dL (ref 70–99)

## 2010-04-23 LAB — GLUCOSE, CAPILLARY
Glucose-Capillary: 82 mg/dL (ref 70–99)
Glucose-Capillary: 135 mg/dL — ABNORMAL HIGH (ref 70–99)
Glucose-Capillary: 101 mg/dL — ABNORMAL HIGH (ref 70–99)
Glucose-Capillary: 108 mg/dL — ABNORMAL HIGH (ref 70–99)

## 2010-04-24 LAB — GLUCOSE, CAPILLARY
Glucose-Capillary: 104 mg/dL — ABNORMAL HIGH (ref 70–99)
Glucose-Capillary: 105 mg/dL — ABNORMAL HIGH (ref 70–99)
Glucose-Capillary: 111 mg/dL — ABNORMAL HIGH (ref 70–99)
Glucose-Capillary: 141 mg/dL — ABNORMAL HIGH (ref 70–99)
Glucose-Capillary: 141 mg/dL — ABNORMAL HIGH (ref 70–99)
Glucose-Capillary: 162 mg/dL — ABNORMAL HIGH (ref 70–99)
Glucose-Capillary: 187 mg/dL — ABNORMAL HIGH (ref 70–99)
Glucose-Capillary: 87 mg/dL (ref 70–99)

## 2010-04-25 LAB — GLUCOSE, CAPILLARY
Glucose-Capillary: 131 mg/dL — ABNORMAL HIGH (ref 70–99)
Glucose-Capillary: 150 mg/dL — ABNORMAL HIGH (ref 70–99)
Glucose-Capillary: 97 mg/dL (ref 70–99)

## 2010-05-03 NOTE — Discharge Summary (Signed)
NAME:  [name] [name], [name] [name]            ACCOUNT NO.:  [id number]  MEDICAL RECORD NO.:  [id number]           PATIENT TYPE:  I  LOCATION:  5153                         FACILITY:  [facility]  PHYSICIAN:  Currie Paris, M.D.DATE OF BIRTH:  04-11-1950  DATE OF ADMISSION:  04/16/2010 DATE OF DISCHARGE:  04/25/2010                              DISCHARGE SUMMARY   ADMITTING PHYSICIAN:  Angelia Mould. Derrell Lolling, M.D.  DISCHARGING PHYSICIAN:  Currie Paris, MD.  CONSULTANTS:  None.  PROCEDURES:  Exploratory laparotomy with closure and omental patch of perforated marginal ulcer with placement of a 22-French feeding gastrostomy tube as well as upper endoscopy by Dr. Ovidio Kin.  This was all done on April 16, 2010.  REASON FOR ADMISSION:  [name] [name] is a 60 year old female who woke up early this morning, not feeling well.  She subsequently doubled over with complaints of abdominal pain.  She has not had any emesis, but did have some nausea.  She does have a history of a Roux-en-Y gastric bypass surgery in 2012 at [facility] with subsequent dilatations of her anastomosis, most recently a year ago.  She presented to the emergency department where she had further workup with a CT scan concerning for evidence of intra-abdominal free air, suspicious for a perforated viscus predominantly in the upper abdomen.  Please see admitting history and physical for further details.  ADMITTING DIAGNOSES: 1. Acute abdominal pain secondary to perforated viscus. 2. Likely perforated peptic ulcer disease.  HOSPITAL COURSE:  At this time, the patient was admitted.  She was placed on IV fluids and IV antibiotics.  She was emergently taken to the operating room where she underwent an exploratory laparotomy with closure and omental patch of perforated marginal ulcer, placement of a feeding gastrostomy tube and upper endoscopy by Dr. Ovidio Kin.  This upper endoscopy did show a significant narrowing  at her anastomosis. Postoperatively, the patient did well with minimal complications.  She was on Invanz as well as Diflucan for possible yeast coverage.  Her G- tube was left to gravity drainage for several days.  Eventually on postoperative day #4, she had some trickle tube feeds started down her G- tube.  She subsequently had an upper GI study done on postoperative day 6, which revealed no evidence of leak.  At this time, it was felt that her tube feeds could be advanced as tolerated to goal rate at 55 mL an hour.  She was also started on clear liquid diet.  She was not to be advanced past the clear liquid diet secondary to the significant stenosis at her anastomotic site.  She did have 2 JP drains placed postoperatively and 1 was discontinued the day prior to discharge and the second one was also discontinued on day of discharge as it had only put out 10 mL of serosanguineous output.  Her staples were left in place.  Her abdomen was soft and appropriately tender.  Her incision was clean, dry, and intact.  DISCHARGE DIAGNOSES: 1. Perforated viscus secondary to marginal ulcer status post Roux-en-Y     bypass. 2. Status post exploratory laparotomy with oversew and omental patch  of marginal ulcer with placement of feeding gastrostomy tube and     upper endoscopy. 3. Postoperative ileus, resolved. 4. Diabetes. 5. Hypertension. 6. Hyperlipidemia. 7. Obesity.  DISCHARGE MEDICATIONS:  Please see medication reconciliation form.  DISCHARGE INSTRUCTIONS:  The patient may increase her activity slowly and walk up steps.  She may shower, however, she is not to bathe.  She is not to do any heavy lifting for the next 6 weeks.  She is not to drive for the next 1-2 weeks.  She is to maintain a clear liquid diet. She is to have tube feeds at 55 mL an hour continuously.  She will follow up with Dr. Derrell Lolling next week in the office on Thursday March 15 at 8:30 a.m. for staple removal as well as  followup.  At this point in time, the patient will still be on tube feeds and will need to be determined whether she needs to continue on this to whether her diet can start to be advanced or whether she will need dilatation.  The patient will need to call for fever greater than 101.5 or worsening abdominal pain.  Home health will be set up for tube feeds management.     [name] Cape, PA   ______________________________ Currie Paris, M.D.    KEO/MEDQ  D:  04/25/2010  T:  04/26/2010  Job:  [id number]  cc:   Angelia Mould. Derrell Lolling, M.D.  Electronically Signed by Barnetta Chapel PA on 05/01/2010 01:34:17 PM Electronically Signed by Cyndia Bent M.D. on 05/03/2010 07:21:38 AM

## 2010-05-14 ENCOUNTER — Ambulatory Visit (HOSPITAL_COMMUNITY): Payer: BC Managed Care – PPO

## 2010-06-24 ENCOUNTER — Encounter (INDEPENDENT_AMBULATORY_CARE_PROVIDER_SITE_OTHER): Payer: Self-pay | Admitting: General Surgery

## 2010-07-05 NOTE — Op Note (Signed)
NAME:  [name] [name], [name] [name]                      ACCOUNT NO.:  [id number]   MEDICAL RECORD NO.:  [id number]                   PATIENT TYPE:  AMB   LOCATION:  ENDO                                 FACILITY:  [facility]   PHYSICIAN:  Anselmo Rod, M.D.               DATE OF BIRTH:  Dec 12, 1950   DATE OF PROCEDURE:  12/01/2001  DATE OF DISCHARGE:                                 OPERATIVE REPORT   PROCEDURE:  Colonoscopy with snare polypectomy x2 and cold biopsies x4.   ENDOSCOPIST:  Anselmo Rod, M.D.   INSTRUMENT USED:  Olympus video colonoscope.   INDICATION FOR PROCEDURE:  A 60 year old African-American female with a  history of breast cancer in the family.  The patient is here for screening  colonoscopy to rule out colonic polyps, masses, etc   PREPROCEDURE PREPARATION:  Informed consent was procured from the patient.  The patient was fasted for eight hours prior to the procedure and prepped  with a bottle of magnesium citrate and a gallon of NuLytely the night prior  to the procedure.   PREPROCEDURE PHYSICAL:  VITAL SIGNS:  The patient had stable vital signs.  NECK:  Supple.  CHEST:  Clear to auscultation.  S1, S2 regular.  ABDOMEN:  Soft with normal bowel sounds.   DESCRIPTION OF PROCEDURE:  The patient was placed in the left lateral  decubitus position and sedated with 90 mg of Demerol and 9 mg of Versed  intravenously.  Once the patient was adequately sedate and maintained on low-  flow oxygen and continuous cardiac monitoring, the Olympus video colonoscope  was advanced from the rectum to the cecum with slight difficulty.  The  patient had some residual stool in the colon.  Multiple washes were done.  Two small sessile polyps were snared at 30 cm.  Another two small sessile  polyps were biopsied (cold biopsy) at 30 cm.  Two small polyps were removed  by cold biopsy forceps at 40 cm.  The rest of the colonic mucosa, including  the transverse colon, right colon, and cecum,  appeared normal.  The  appendiceal orifice and the ileocecal valve were clearly visualized and  photographed.  Retroflexion revealed no evidence of hemorrhoids.  A small  external hemorrhoid was seen on withdrawal of the scope.   IMPRESSION:  1. Left-sided colonic polyps, see description above.  2. Normal-appearing cecum, right colon, and transverse colon.  3. Small external hemorrhoid.    RECOMMENDATIONS:  1. Await pathology results.  2. Avoid all nonsteroidals, including aspirin.  3. Outpatient follow-up in the next two weeks for further recommendations.                                               Anselmo Rod, M.D.    JNM/MEDQ  D:  12/01/2001  T:  12/02/2001  Job:  [id number]   cc:   Naima A. Normand Sloop, M.D.

## 2010-09-10 ENCOUNTER — Encounter (INDEPENDENT_AMBULATORY_CARE_PROVIDER_SITE_OTHER): Payer: Self-pay | Admitting: General Surgery

## 2010-09-16 ENCOUNTER — Encounter (INDEPENDENT_AMBULATORY_CARE_PROVIDER_SITE_OTHER): Payer: Self-pay | Admitting: General Surgery

## 2010-09-16 ENCOUNTER — Ambulatory Visit (INDEPENDENT_AMBULATORY_CARE_PROVIDER_SITE_OTHER): Payer: BC Managed Care – PPO | Admitting: General Surgery

## 2010-09-16 VITALS — Temp 97.0°F

## 2010-09-16 DIAGNOSIS — L089 Local infection of the skin and subcutaneous tissue, unspecified: Secondary | ICD-10-CM

## 2010-09-16 DIAGNOSIS — T148XXA Other injury of unspecified body region, initial encounter: Secondary | ICD-10-CM

## 2010-09-16 NOTE — Progress Notes (Signed)
Subjective:     Patient ID: [name] [name], female   DOB: 1950/07/05, 60 y.o.   MRN: [id number]  HPI Patient has completedlyrecovered from repair of the perforation of a marginal ulcer at her gastrojejunostomy with omental patch and endoscopy. She is status post Roux-en-Y gastric bypass grafting at [facility].  She continues to follow with Dr. Leona Carry at [facility]. They have done endoscopy. She is swallowing reasonably well. She now drinks buffered water and her reflux symptoms are better. Her weight has been stable. They do not think that they will need to revise her gastrojejunostomy.  Her only ongoing problem as far as I am concerned he is a small area of nonhealing wound. She continues to pack this daily. Is not painful. Simply will not heal.  Past Medical History  Diagnosis Date  . Diabetes mellitus    Current Outpatient Prescriptions  Medication Sig Dispense Refill  . Ascorbic Acid (VITAMIN C) 500 MG tablet Take 500 mg by mouth daily.        Marland Kitchen BIOTIN PO Take by mouth.        . Calcium Carbonate-Vitamin D (CALTRATE 600+D PO) Take by mouth. Also takes a nasal spray form of calcium       . Multiple Vitamin (MULTIVITAMIN PO) Take by mouth.        . Omeprazole Magnesium (PRILOSEC OTC PO) Take by mouth.        Marland Kitchen VITAMIN D, ERGOCALCIFEROL, PO Take by mouth.        Marland Kitchen aspirin 81 MG tablet Take 81 mg by mouth daily.        . SitaGLIPtin Phosphate (JANUVIA PO) Take by mouth.          Allergies  Allergen Reactions  . Darvon Hives and Itching    All over body  . Penicillins Hives and Itching    All over body    History reviewed. No pertinent family history.  History  Substance Use Topics  . Smoking status: Never Smoker   . Smokeless tobacco: Not on file  . Alcohol Use: Yes     2-3 GLASSES OF WINE A WEEK     Review of Systems 10 system review of systems is performed and is noncontributory except as described above    Objective:   Physical Exam The patient  looks well and is in no distress.  Lungs are clear auscultation.  Heart regular rate and rhythm the murmur  Soft and nontender. The upper midline incision shows 2 small punctate areas of drainage. These are probed with a hemostat  down to the fascia. In the upper one I thought I was  Grabbing a suture but it was too painful to pull up. There is no cellulitis. 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  Status post Roux-en-Y gastric bypass. #3 status post emergent exploration, closure and omental patch of perforated marginal ulcer, upper endoscopy.  Diabetes  Hypertension  Hyperlipidemia.     Plan:     Schedule for wound exploration under anesthesia, debridement of wound and removal of suture material.  I discussed the indications and details of surgery with her and her husband. Risks and complications were discussed,  including but not limited to bleeding, infection, delayed healing, hernia formation, injury to adjacent organs, and allergic problems. They understand these issues well. At this time all the questions were answered. They're in full agreement with this plan.

## 2010-09-16 NOTE — Patient Instructions (Signed)
You have a chronic wound infection, possibly due to bacteria and retained suture material. It has not healed in 5 months and so I think the odds of that healing are very low. We had discussed and decided to go ahead with exploration of your wound under general anesthesia and debridement of any infected tissue. Hopefully this will be done as an outpatient.

## 2010-10-02 ENCOUNTER — Ambulatory Visit (HOSPITAL_BASED_OUTPATIENT_CLINIC_OR_DEPARTMENT_OTHER): Admission: RE | Admit: 2010-10-02 | Payer: BC Managed Care – PPO | Source: Ambulatory Visit | Admitting: General Surgery

## 2010-12-05 ENCOUNTER — Telehealth (INDEPENDENT_AMBULATORY_CARE_PROVIDER_SITE_OTHER): Payer: Self-pay | Admitting: General Surgery

## 2010-12-09 ENCOUNTER — Telehealth (INDEPENDENT_AMBULATORY_CARE_PROVIDER_SITE_OTHER): Payer: Self-pay

## 2010-12-09 NOTE — Telephone Encounter (Signed)
LMOM for pt that ok per Dr Derrell Lolling to r/s surgery but will need ov prior to date of surgery for recheck preop. Debbie in scheduling advised.

## 2010-12-10 ENCOUNTER — Telehealth (INDEPENDENT_AMBULATORY_CARE_PROVIDER_SITE_OTHER): Payer: Self-pay

## 2010-12-10 NOTE — Telephone Encounter (Signed)
I received a msg from Debbie in scheduling re:[name] [name] going on vacation 9 days po. We cannot assure [name] [name] that she will be completly healed to go out of the country. Her decision re: vacation will be up to her. There is no reason per Dr Derrell Lolling that she cannot wait for surgery until after vacation unless her symptoms and or drainage is worse. We will leave this up to [name] [name] to decide. I lmom for [name] [name] to call re: this.

## 2010-12-16 ENCOUNTER — Encounter (INDEPENDENT_AMBULATORY_CARE_PROVIDER_SITE_OTHER): Payer: Self-pay | Admitting: General Surgery

## 2010-12-16 ENCOUNTER — Ambulatory Visit (INDEPENDENT_AMBULATORY_CARE_PROVIDER_SITE_OTHER): Payer: BC Managed Care – PPO | Admitting: General Surgery

## 2010-12-16 VITALS — BP 146/88 | HR 60 | Temp 97.2°F | Resp 18 | Ht 64.0 in | Wt 166.1 lb

## 2010-12-16 DIAGNOSIS — L089 Local infection of the skin and subcutaneous tissue, unspecified: Secondary | ICD-10-CM

## 2010-12-16 DIAGNOSIS — T148XXA Other injury of unspecified body region, initial encounter: Secondary | ICD-10-CM

## 2010-12-16 NOTE — Progress Notes (Signed)
Subjective:     Patient ID: [name] [name], female   DOB: [birth year], 60 y.o.   MRN: [id number]  HPI This patient returns for a preop wound check. She has 2 small draining areas in her upper midline wound which will not heal. It is suspected that she has a suture granuloma or some other chronic nidus of infection. She is scheduled for wound exploration and debridement on November 27.  She has a history of a Roux-en-Y gastric bypass in [place]. She subsequently developed perforation of a marginal ulcer at the gastrojejunostomy and had surgery performed by me a few months ago. She has recovered from that. She continues close clinical followup with Dr. Darral Dash at [facility] and other than an anastomotic stricture she seems to be doing quite well.  She is here with one of her daughters for a wound check prior to her upcoming surgery.  Review of Systems     Objective:   Physical Exam The patient looks well. She is in no distress.  Abdomen -  soft flat and nontender. Upper midline incision completely healed except for 2 small open areas each are draining minimal fluid. I probed the lower one I could not find a suture. The does not seem to be any surrounding cellulitis. The tissues are little bit thickened but this seems to be a very localized process.    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  Status post exploratory laparotomy, closure and patch of perforated marginal ulcer at gastrojejunal anastomosis, gastrostomy tube placement, and endoscopy. No apparent intra-abdominal complications  Remote history Roux-en-Y gastric bypass in [facility].    Plan:     Proceed with exploration and debridement of superficial abdominal wound on November 27. We have discussed this in detail previously and again today. She understands the details of the procedure, the risks and complications, and the goals of the procedure. Our  hope is that there is an 80-90% chance of complete healing by secondary intention after the surgery. She is accepting of those goals.

## 2010-12-16 NOTE — Patient Instructions (Signed)
You are scheduled for surgery to remove the infected tissue from your abdominal wound. The date of surgery is November 27.

## 2011-01-01 ENCOUNTER — Other Ambulatory Visit (INDEPENDENT_AMBULATORY_CARE_PROVIDER_SITE_OTHER): Payer: Self-pay

## 2011-01-06 ENCOUNTER — Encounter (HOSPITAL_BASED_OUTPATIENT_CLINIC_OR_DEPARTMENT_OTHER): Payer: Self-pay | Admitting: *Deleted

## 2011-01-06 NOTE — Progress Notes (Signed)
Had ekg and cxr 3/12-surg then To come in for bmet

## 2011-01-13 ENCOUNTER — Encounter (HOSPITAL_BASED_OUTPATIENT_CLINIC_OR_DEPARTMENT_OTHER): Payer: Self-pay | Admitting: *Deleted

## 2011-01-13 ENCOUNTER — Other Ambulatory Visit (INDEPENDENT_AMBULATORY_CARE_PROVIDER_SITE_OTHER): Payer: Self-pay | Admitting: General Surgery

## 2011-01-13 ENCOUNTER — Encounter (HOSPITAL_BASED_OUTPATIENT_CLINIC_OR_DEPARTMENT_OTHER)
Admission: RE | Admit: 2011-01-13 | Discharge: 2011-01-13 | Disposition: A | Discharge status: Home / Self Care | Payer: BC Managed Care – PPO | Source: Ambulatory Visit | Attending: General Surgery | Admitting: General Surgery

## 2011-01-13 ENCOUNTER — Telehealth (INDEPENDENT_AMBULATORY_CARE_PROVIDER_SITE_OTHER): Payer: Self-pay

## 2011-01-13 LAB — BASIC METABOLIC PANEL
BUN: 12 mg/dL (ref 6–23)
CO2: 31 mEq/L (ref 19–32)
Calcium: 9.7 mg/dL (ref 8.4–10.5)
Chloride: 101 mEq/L (ref 96–112)
Creatinine, Ser: 0.47 mg/dL — ABNORMAL LOW (ref 0.50–1.10)
GFR calc Af Amer: >90 mL/min (ref >90)
GFR calc non Af Amer: >90 mL/min (ref >90)
Glucose, Bld: 205 mg/dL — ABNORMAL HIGH (ref 70–99)
Potassium: 4.5 mEq/L (ref 3.5–5.1)
Sodium: 142 mEq/L (ref 135–145)

## 2011-01-13 NOTE — Telephone Encounter (Signed)
Cindy from surgical center wanted to give FYI that patients glucose was 205 today when it was checked. Patient is scheduled for surgery tomorrow for abdominal wound debridement.

## 2011-01-13 NOTE — Progress Notes (Signed)
Spoke with Marcelino Duster at Dr Jacinto Halim office.  Advised of glu of 250 today on bmet.  She will inform Dr. Derrell Lolling.

## 2011-01-13 NOTE — Telephone Encounter (Signed)
This has been copied and given to Dr Derrell Lolling for review.

## 2011-01-13 NOTE — H&P (Signed)
         [name] [name]  Description:  60 year old female  12/16/2010 9:30 AM Office Visit Provider:  Lilliah Priego M, MD  MRN: [id number] Department:  [facility]            Diagnoses  Reason for Visit    Wound infection - Primary  Follow-up   958.3  f/u wound exploration            Vitals - Last Recorded       BP  Pulse  Temp(Src)  Resp  Ht  Wt    146/88  60  97.2 F (36.2 C) (Temporal)  18  5' 4" (1.626 m)  166 lb 2 oz (75.354 kg)           BMI               28.52 kg/m2                   Progress Notes     Tymere Depuy M, MD 12/16/2010 9:54 AM Signed    Subjective:    Patient ID: [name] [name], female DOB: 05/10/1950, 60 y.o. MRN: [id number]  HPI  This patient returns for a preop wound check. She has 2 small draining areas in her upper midline wound which will not heal. It is suspected that she has a suture granuloma or some other chronic nidus of infection. She is scheduled for wound exploration and debridement on November 27.  She has a history of a Roux-en-Y gastric bypass in [place]. She subsequently developed perforation of a marginal ulcer at the gastrojejunostomy and had surgery performed by me a few months ago. She has recovered from that. She continues close clinical followup with Dr. Timothy Ferrell at [facility] and other than an anastomotic stricture she seems to be doing quite well.  She is here with one of her daughters for a wound check prior to her upcoming surgery.  Review of Systems     Objective:    Physical Exam  The patient looks well. She is in no distress.  Abdomen - soft flat and nontender. Upper midline incision completely healed except for 2 small open areas each are draining minimal fluid. I probed the lower one I could not find a suture. The does not seem to be any surrounding cellulitis. The tissues are little bit thickened but this seems to be a very localized process.     Assessment:     Chronic  wound infection, abdominal wound, superficial. 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  Status post exploratory laparotomy, closure and patch of perforated marginal ulcer at gastrojejunal anastomosis, gastrostomy tube placement, and endoscopy. No apparent intra-abdominal complications  Remote history Roux-en-Y gastric bypass in [facility].     Plan:     Proceed with exploration and debridement of superficial abdominal wound on November 27. We have discussed this in detail previously and again today. She understands the details of the procedure, the risks and complications, and the goals of the procedure. Our hope is that there is an 80-90% chance of complete healing by secondary intention after the surgery. She is accepting of those goals.              Not recorded                  Discontinued Medications         Reason for Discontinue    SitaGLIPtin Phosphate (JANUVIA PO)    Error                 Patient Instructions     You are scheduled for surgery to remove the infected tissue from your abdominal wound. The date of surgery is November 27.          Level of Service     PR POST-OP FOLLOW-UP VISIT [99024]           All Flowsheet Templates (all recorded)     Encounter Vitals Flowsheet   Custom Formula Data Flowsheet   Anthropometrics Flowsheet                           Referring Provider          Robyn N Sanders            All Charges for This Encounter       Code  Description  Service Date  Service Provider  Modifiers  Quantity    99024  PR POST-OP FOLLOW-UP VISIT  12/16/2010  Arel Tippen M Akari Defelice, MD   1                Other Encounter Related Information     Allergies & Medications      Problem List      History      Patient-Entered Questionnaires       No data filed                      

## 2011-01-14 ENCOUNTER — Ambulatory Visit (HOSPITAL_BASED_OUTPATIENT_CLINIC_OR_DEPARTMENT_OTHER)
Admission: RE | Admit: 2011-01-14 | Discharge: 2011-01-14 | Disposition: A | Discharge status: Home / Self Care | Payer: BC Managed Care – PPO | Source: Ambulatory Visit | Attending: General Surgery | Admitting: General Surgery

## 2011-01-14 ENCOUNTER — Other Ambulatory Visit (INDEPENDENT_AMBULATORY_CARE_PROVIDER_SITE_OTHER): Payer: Self-pay | Admitting: General Surgery

## 2011-01-14 ENCOUNTER — Encounter (HOSPITAL_BASED_OUTPATIENT_CLINIC_OR_DEPARTMENT_OTHER): Payer: Self-pay | Admitting: Anesthesiology

## 2011-01-14 ENCOUNTER — Ambulatory Visit (HOSPITAL_BASED_OUTPATIENT_CLINIC_OR_DEPARTMENT_OTHER): Payer: BC Managed Care – PPO | Admitting: Anesthesiology

## 2011-01-14 ENCOUNTER — Encounter (HOSPITAL_BASED_OUTPATIENT_CLINIC_OR_DEPARTMENT_OTHER)
Admission: RE | Disposition: A | Discharge status: Home / Self Care | Payer: Self-pay | Source: Ambulatory Visit | Attending: General Surgery

## 2011-01-14 ENCOUNTER — Encounter (HOSPITAL_BASED_OUTPATIENT_CLINIC_OR_DEPARTMENT_OTHER): Payer: Self-pay | Admitting: *Deleted

## 2011-01-14 ENCOUNTER — Encounter (HOSPITAL_BASED_OUTPATIENT_CLINIC_OR_DEPARTMENT_OTHER): Payer: Self-pay | Admitting: General Surgery

## 2011-01-14 DIAGNOSIS — Z01812 Encounter for preprocedural laboratory examination: Secondary | ICD-10-CM | POA: Insufficient documentation

## 2011-01-14 DIAGNOSIS — Y849 Medical procedure, unspecified as the cause of abnormal reaction of the patient, or of later complication, without mention of misadventure at the time of the procedure: Secondary | ICD-10-CM | POA: Insufficient documentation

## 2011-01-14 DIAGNOSIS — Z9884 Bariatric surgery status: Secondary | ICD-10-CM | POA: Insufficient documentation

## 2011-01-14 DIAGNOSIS — IMO0002 Reserved for concepts with insufficient information to code with codable children: Secondary | ICD-10-CM | POA: Insufficient documentation

## 2011-01-14 DIAGNOSIS — T8189XA Other complications of procedures, not elsewhere classified, initial encounter: Secondary | ICD-10-CM | POA: Insufficient documentation

## 2011-01-14 DIAGNOSIS — S31109A Unspecified open wound of abdominal wall, unspecified quadrant without penetration into peritoneal cavity, initial encounter: Secondary | ICD-10-CM

## 2011-01-14 HISTORY — PX: WOUND DEBRIDEMENT: SHX247

## 2011-01-14 SURGERY — DEBRIDEMENT, WOUND, ABDOMEN
Anesthesia: General | Site: Abdomen | Wound class: Contaminated

## 2011-01-14 MED ORDER — MIDAZOLAM HCL 5 MG/5ML IJ SOLN
INTRAMUSCULAR | Status: DC | PRN
  Administered 2011-01-14: 1 mg via INTRAVENOUS

## 2011-01-14 MED ORDER — KETOROLAC TROMETHAMINE 30 MG/ML IJ SOLN: 15.0000 mg | Freq: Once | INTRAMUSCULAR | Status: DC | PRN

## 2011-01-14 MED ORDER — DROPERIDOL 2.5 MG/ML IJ SOLN
INTRAMUSCULAR | Status: DC | PRN
  Administered 2011-01-14: 0.625 mg via INTRAVENOUS

## 2011-01-14 MED ORDER — HYDROCODONE-ACETAMINOPHEN 5-325 MG PO TABS: 1.0000 | ORAL_TABLET | ORAL | Status: AC | PRN

## 2011-01-14 MED ORDER — DEXAMETHASONE SODIUM PHOSPHATE 4 MG/ML IJ SOLN
INTRAMUSCULAR | Status: DC | PRN
  Administered 2011-01-14: 10 mg via INTRAVENOUS

## 2011-01-14 MED ORDER — IBUPROFEN 200 MG PO TABS: 200.0000 mg | ORAL_TABLET | Freq: Four times a day (QID) | ORAL | Status: DC | PRN

## 2011-01-14 MED ORDER — VANCOMYCIN HCL IN DEXTROSE 1-5 GM/200ML-% IV SOLN
1000.0000 mg | INTRAVENOUS | Status: AC
  Administered 2011-01-14: 1000 mg via INTRAVENOUS

## 2011-01-14 MED ORDER — OXYMETAZOLINE HCL 0.05 % NA SOLN: 2.0000 | Freq: Once | NASAL | Status: DC

## 2011-01-14 MED ORDER — ACETAMINOPHEN 100 MG/ML PO SOLN: 15.0000 mg/kg | ORAL | Status: DC | PRN

## 2011-01-14 MED ORDER — MIDAZOLAM HCL 2 MG/ML PO SYRP: 0.5000 mg/kg | ORAL_SOLUTION | Freq: Once | ORAL | Status: DC

## 2011-01-14 MED ORDER — MIDAZOLAM HCL 2 MG/2ML IJ SOLN: 1.0000 mg | INTRAMUSCULAR | Status: DC | PRN

## 2011-01-14 MED ORDER — SODIUM CHLORIDE 0.9 % IV SOLN: 0.1000 mg/kg | Freq: Once | INTRAVENOUS | Status: DC | PRN

## 2011-01-14 MED ORDER — MIDAZOLAM HCL 2 MG/2ML IJ SOLN: 0.5000 mg | INTRAMUSCULAR | Status: DC | PRN

## 2011-01-14 MED ORDER — ACETAMINOPHEN 10 MG/ML IV SOLN
1000.0000 mg | Freq: Once | INTRAVENOUS | Status: AC
  Administered 2011-01-14 (×2): 1000 mg via INTRAVENOUS

## 2011-01-14 MED ORDER — FENTANYL CITRATE 0.05 MG/ML IJ SOLN: 25.0000 ug | INTRAMUSCULAR | Status: DC | PRN

## 2011-01-14 MED ORDER — METOCLOPRAMIDE HCL 5 MG/ML IJ SOLN: 10.0000 mg | Freq: Once | INTRAMUSCULAR | Status: DC | PRN

## 2011-01-14 MED ORDER — PROPOFOL 10 MG/ML IV EMUL
INTRAVENOUS | Status: DC | PRN
  Administered 2011-01-14: 250 mg via INTRAVENOUS

## 2011-01-14 MED ORDER — HYDROCODONE-ACETAMINOPHEN 5-325 MG PO TABS
1.0000 | ORAL_TABLET | Freq: Once | ORAL | Status: AC | PRN
  Administered 2011-01-14: 1 via ORAL

## 2011-01-14 MED ORDER — LACTATED RINGERS IV SOLN
INTRAVENOUS | Status: DC
  Administered 2011-01-14: 08:00:00 via INTRAVENOUS

## 2011-01-14 MED ORDER — LIDOCAINE HCL (CARDIAC) 20 MG/ML IV SOLN
INTRAVENOUS | Status: DC | PRN
  Administered 2011-01-14: 75 mg via INTRAVENOUS

## 2011-01-14 MED ORDER — ONDANSETRON HCL 4 MG/2ML IJ SOLN
INTRAMUSCULAR | Status: DC | PRN
  Administered 2011-01-14: 4 mg via INTRAVENOUS

## 2011-01-14 MED ORDER — BUPIVACAINE-EPINEPHRINE PF 0.5-1:200000 % IJ SOLN
INTRAMUSCULAR | Status: DC | PRN
  Administered 2011-01-14: 10 mL

## 2011-01-14 MED ORDER — MORPHINE SULFATE 2 MG/ML IJ SOLN: 0.0500 mg/kg | INTRAMUSCULAR | Status: DC | PRN

## 2011-01-14 MED ORDER — FENTANYL CITRATE 0.05 MG/ML IJ SOLN
INTRAMUSCULAR | Status: DC | PRN
  Administered 2011-01-14 (×2): 50 ug via INTRAVENOUS

## 2011-01-14 MED ORDER — FENTANYL CITRATE 0.05 MG/ML IJ SOLN: 50.0000 ug | INTRAMUSCULAR | Status: DC | PRN

## 2011-01-14 MED ORDER — LACTATED RINGERS IV SOLN: 500.0000 mL | INTRAVENOUS | Status: DC

## 2011-01-14 MED ORDER — LIDOCAINE-PRILOCAINE 2.5-2.5 % EX CREA: 1.0000 "application " | TOPICAL_CREAM | Freq: Once | CUTANEOUS | Status: DC

## 2011-01-14 MED ORDER — ACETAMINOPHEN 80 MG RE SUPP: 20.0000 mg/kg | RECTAL | Status: DC | PRN

## 2011-01-14 SURGICAL SUPPLY — 55 items
ADH SKN CLS APL DERMABOND .7 (GAUZE/BANDAGES/DRESSINGS)
APL SKNCLS STERI-STRIP NONHPOA (GAUZE/BANDAGES/DRESSINGS)
APPLICATOR COTTON TIP 6IN STRL (MISCELLANEOUS) ×2 IMPLANT
BENZOIN TINCTURE PRP APPL 2/3 (GAUZE/BANDAGES/DRESSINGS) IMPLANT
BLADE HEX COATED 2.75 (ELECTRODE) ×2 IMPLANT
BLADE SURG 10 STRL SS (BLADE) ×2 IMPLANT
BLADE SURG 15 STRL LF DISP TIS (BLADE) ×1 IMPLANT
BLADE SURG 15 STRL SS (BLADE) ×2
BLADE SURG ROTATE 9660 (MISCELLANEOUS) IMPLANT
CANISTER SUCTION 1200CC (MISCELLANEOUS) ×1 IMPLANT
CHLORAPREP W/TINT 26ML (MISCELLANEOUS) ×2 IMPLANT
CLOTH BEACON ORANGE TIMEOUT ST (SAFETY) ×2 IMPLANT
COVER MAYO STAND STRL (DRAPES) ×2 IMPLANT
COVER TABLE BACK 60X90 (DRAPES) ×2 IMPLANT
DECANTER SPIKE VIAL GLASS SM (MISCELLANEOUS) ×1 IMPLANT
DERMABOND ADVANCED (GAUZE/BANDAGES/DRESSINGS)
DERMABOND ADVANCED .7 DNX12 (GAUZE/BANDAGES/DRESSINGS) IMPLANT
DRAPE PED LAPAROTOMY (DRAPES) ×2 IMPLANT
DRAPE UTILITY XL STRL (DRAPES) ×2 IMPLANT
ELECT REM PT RETURN 9FT ADLT (ELECTROSURGICAL) ×2
ELECTRODE REM PT RTRN 9FT ADLT (ELECTROSURGICAL) ×1 IMPLANT
GAUZE SPONGE 4X4 12PLY STRL LF (GAUZE/BANDAGES/DRESSINGS) IMPLANT
GLOVE EUDERMIC 7 POWDERFREE (GLOVE) ×2 IMPLANT
GLOVE SKINSENSE NS SZ6.5 (GLOVE) ×1
GLOVE SKINSENSE STRL SZ6.5 (GLOVE) IMPLANT
GLOVE SURG SS PI 7.0 STRL IVOR (GLOVE) ×1 IMPLANT
GOWN PREVENTION PLUS XLARGE (GOWN DISPOSABLE) ×2 IMPLANT
GOWN PREVENTION PLUS XXLARGE (GOWN DISPOSABLE) ×2 IMPLANT
NDL HYPO 25X1 1.5 SAFETY (NEEDLE) ×1 IMPLANT
NEEDLE HYPO 25X1 1.5 SAFETY (NEEDLE) ×2 IMPLANT
PACK BASIN DAY SURGERY FS (CUSTOM PROCEDURE TRAY) ×2 IMPLANT
PENCIL BUTTON HOLSTER BLD 10FT (ELECTRODE) ×2 IMPLANT
SLEEVE SCD COMPRESS KNEE MED (MISCELLANEOUS) IMPLANT
SPONGE GAUZE 4X4 12PLY (GAUZE/BANDAGES/DRESSINGS) ×1 IMPLANT
SPONGE LAP 18X18 X RAY DECT (DISPOSABLE) ×1 IMPLANT
SPONGE LAP 4X18 X RAY DECT (DISPOSABLE) ×2 IMPLANT
STAPLER VISISTAT 35W (STAPLE) IMPLANT
STRIP CLOSURE SKIN 1/2X4 (GAUZE/BANDAGES/DRESSINGS) IMPLANT
STRIP CLOSURE SKIN 1/4X4 (GAUZE/BANDAGES/DRESSINGS) IMPLANT
SUT MON AB 4-0 PC3 18 (SUTURE) IMPLANT
SUT VIC AB 2-0 CT1 27 (SUTURE)
SUT VIC AB 2-0 CT1 TAPERPNT 27 (SUTURE) IMPLANT
SUT VICRYL 3-0 CR8 SH (SUTURE) IMPLANT
SUT VICRYL 4-0 PS2 18IN ABS (SUTURE) IMPLANT
SUT VICRYL AB 2 0 TIE (SUTURE) IMPLANT
SUT VICRYL AB 2 0 TIES (SUTURE)
SYR BULB 3OZ (MISCELLANEOUS) ×1 IMPLANT
SYR CONTROL 10ML LL (SYRINGE) ×2 IMPLANT
TAPE CLOTH SURG 4X10 WHT LF (GAUZE/BANDAGES/DRESSINGS) ×1 IMPLANT
TOWEL OR 17X24 6PK STRL BLUE (TOWEL DISPOSABLE) ×3 IMPLANT
TOWEL OR NON WOVEN STRL DISP B (DISPOSABLE) ×2 IMPLANT
TRAY DSU PREP LF (CUSTOM PROCEDURE TRAY) ×2 IMPLANT
TUBE CONNECTING 20X1/4 (TUBING) ×1 IMPLANT
WATER STERILE IRR 1000ML POUR (IV SOLUTION) ×1 IMPLANT
YANKAUER SUCT BULB TIP NO VENT (SUCTIONS) ×1 IMPLANT

## 2011-01-14 NOTE — H&P (View-Only) (Signed)
[name] [name]  Description:  60 year old female  12/16/2010 9:30 AM Office Visit Provider:  Ernestene Mention, MD  MRN: [id number] Department:  [facility]            Diagnoses  Reason for Visit    Wound infection - Primary  Follow-up   958.3  f/u wound exploration            Vitals - Last Recorded       BP  Pulse  Temp(Src)  Resp  Ht  Wt    146/88  60  97.2 F (36.2 C) (Temporal)  18  5\' 4"  (1.626 m)  166 lb 2 oz (75.354 kg)           BMI               28.52 kg/m2                   Progress Notes     Ernestene Mention, MD 12/16/2010 9:54 AM Signed    Subjective:    Patient ID: [name] [name], female DOB: 13-Jan-1951, 60 y.o. MRN: [id number]  HPI  This patient returns for a preop wound check. She has 2 small draining areas in her upper midline wound which will not heal. It is suspected that she has a suture granuloma or some other chronic nidus of infection. She is scheduled for wound exploration and debridement on November 27.  She has a history of a Roux-en-Y gastric bypass in [place]. She subsequently developed perforation of a marginal ulcer at the gastrojejunostomy and had surgery performed by me a few months ago. She has recovered from that. She continues close clinical followup with Dr. Darral Dash at [facility] and other than an anastomotic stricture she seems to be doing quite well.  She is here with one of her daughters for a wound check prior to her upcoming surgery.  Review of Systems     Objective:    Physical Exam  The patient looks well. She is in no distress.  Abdomen - soft flat and nontender. Upper midline incision completely healed except for 2 small open areas each are draining minimal fluid. I probed the lower one I could not find a suture. The does not seem to be any surrounding cellulitis. The tissues are little bit thickened but this seems to be a very localized process.     Assessment:     Chronic  wound infection, abdominal wound, superficial. 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  Status post exploratory laparotomy, closure and patch of perforated marginal ulcer at gastrojejunal anastomosis, gastrostomy tube placement, and endoscopy. No apparent intra-abdominal complications  Remote history Roux-en-Y gastric bypass in [facility].     Plan:     Proceed with exploration and debridement of superficial abdominal wound on November 27. We have discussed this in detail previously and again today. She understands the details of the procedure, the risks and complications, and the goals of the procedure. Our hope is that there is an 80-90% chance of complete healing by secondary intention after the surgery. She is accepting of those goals.              Not recorded                  Discontinued Medications         Reason for Discontinue    SitaGLIPtin Phosphate (JANUVIA PO)  Error                 Patient Instructions     You are scheduled for surgery to remove the infected tissue from your abdominal wound. The date of surgery is November 27.          Level of Service     PR POST-OP FOLLOW-UP VISIT [78295]           All Flowsheet Templates (all recorded)     Encounter Vitals Flowsheet   Custom Formula Data Flowsheet   Anthropometrics Flowsheet                           Referring Provider          [name]            All Charges for This Encounter       Code  Description  Service Date  Service Provider  Modifiers  Quantity    [phone]  PR POST-OP FOLLOW-UP VISIT  12/16/2010  Ernestene Mention, MD   1                Other Encounter Related Information     Allergies & Medications      Problem List      History      Patient-Entered Questionnaires       No data filed

## 2011-01-14 NOTE — Anesthesia Postprocedure Evaluation (Signed)
Anesthesia Post Note  Patient: [name] [name]  Procedure(s) Performed:  DEBRIDEMENT ABDOMINAL WOUND - wound debridement and debridement on the abdomen  Anesthesia type: General  Patient location: PACU  Post pain: Pain level controlled  Post assessment: Patient's Cardiovascular Status Stable  Last Vitals:  Filed Vitals:   01/14/11 0945  BP: 131/56  Pulse: 79  Temp:   Resp: 18    Post vital signs: Reviewed and stable  Level of consciousness: alert  Complications: No apparent anesthesia complications

## 2011-01-14 NOTE — Transfer of Care (Signed)
Immediate Anesthesia Transfer of Care Note  Patient: [name] [name]  Procedure(s) Performed:  DEBRIDEMENT ABDOMINAL WOUND - wound debridement and debridement on the abdomen  Patient Location: PACU  Anesthesia Type: General  Level of Consciousness: awake, alert  and oriented  Airway & Oxygen Therapy: Patient Spontanous Breathing and Patient connected to face mask oxygen  Post-op Assessment: Report given to PACU RN and Post -op Vital signs reviewed and stable  Post vital signs: Reviewed and stable  Complications: No apparent anesthesia complications

## 2011-01-14 NOTE — Interval H&P Note (Signed)
History and Physical Interval Note:   01/14/2011   7:31 AM   [name] [name]  has presented today for surgery, with the diagnosis of non healing abdominal wound  The various methods of treatment have been discussed with the patient and family. After consideration of risks, benefits and other options for treatment, the patient has consented to  Procedure(s): DEBRIDEMENT ABDOMINAL WOUND as a surgical intervention .  The patients' history has been reviewed today, patient examined today, there is no change in status, she is stable for surgery.  I have reviewed the patients' chart and labs.  Questions were answered to the patient's satisfaction.  She agrees with the planned procedure.  Ernestene Mention  MD 01/14/2011 7:32 AM

## 2011-01-14 NOTE — Op Note (Signed)
Patient Name:           [name] [name]   Date of Surgery:        01/14/2011  Pre op Diagnosis:      Non-healing abdominal wound  Post op Diagnosis:    Non-healing abdominal wound  Procedure:                 Debridement skin and subcutaneous tissue abdominal wall, 5 cm x 2 cm area, removed suture granuloma  Surgeon:                     Angelia Mould. Derrell Lolling, M.D., FACS  Assistant:                        Operative Indications:   This is a 60 year old [place] American female who has a history of Roux-en-Y gastric bypass in [place]. She subsequently developed an acute perforation of a marginal ulcer at the gastrojejunostomy and presented here with an acute abdomen. She underwent emergent surgery by me to close and patch the ulcer and perform endoscopy and feeding gastrostomy. She has recovered from that. She continues close clinical followup with Dr. Darral Dash at [facility], and other than an anastomotic stricture she has been doing quite well. She has had problems with nonhealing of a small area of her upper abdominal wound. This has been explored and probed several times in the office thinking that there was a suture granuloma but we could not find a suture and it was very painful. We decided to bring her to the operating room for wound expiration debridement under general anesthesia in hopes that we would eventually heal under secondary intention.  Operative Findings:       We found a suture granuloma. There was a single monofilament suture at the base of this. There is granulation tissue around this. There was no evidence of any communication with the abdominal cavity. 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  Procedure in Detail:          Following the induction of general LMA anesthesia the patient's abdomen was prepped and draped in a sterile fashion. Surgical time out was held. 0.25% Marcaine with  epinephrine was used as a local infiltration anesthetic. Using a hemostat I probed deeply into the wound and when I went in a cephalad direction I was able to grasp the monofilament suture and pull the tail  into the wound to see it. I then used a kniife and cut around this area completely making an elliptical incision approximately 5 cm x 2 cm. The dissection went all the way down to the fascia. We isolated the granuloma and cut the suture out. We explored and debrided the granulomatous tissue and then cauterized it extensively with cautery. There was no purulence or drainage. This appeared to be confined to the abdominal wall with no evidence of communication to the abdominal cavity. After we were satisfied with the exploration we irrigated with saline and packed with saline moistened gauze a dry dressing.  She tolerated the procedure well. No complications. Counts were correct. EBL less than 10 cc.     Angelia Mould. Derrell Lolling, M.D., FACS General and Minimally Invasive Surgery Breast and Colorectal Surgery  01/14/2011 9:01 AM

## 2011-01-14 NOTE — Anesthesia Preprocedure Evaluation (Addendum)
Anesthesia Evaluation  Patient identified by MRN, date of birth, ID band Patient awake    Reviewed: Allergy & Precautions, H&P , NPO status , Patient's Chart, lab work & pertinent test results, reviewed documented beta blocker date and time   Airway Mallampati: II TM Distance: >3 FB Neck ROM: full    Dental   Pulmonary neg pulmonary ROS,    Pulmonary exam normal       Cardiovascular + Valvular Problems/Murmurs     Neuro/Psych Negative Neurological ROS  Negative Psych ROS   GI/Hepatic negative GI ROS, Neg liver ROS,   Endo/Other  Negative Endocrine ROS  Renal/GU negative Renal ROS  Genitourinary negative   Musculoskeletal   Abdominal   Peds  Hematology negative hematology ROS (+)   Anesthesia Other Findings See surgeon's H&P   Reproductive/Obstetrics negative OB ROS                           Anesthesia Physical Anesthesia Plan  ASA: I  Anesthesia Plan: General   Post-op Pain Management:    Induction: Intravenous  Airway Management Planned: LMA  Additional Equipment:   Intra-op Plan:   Post-operative Plan: Extubation in OR  Informed Consent: I have reviewed the patients History and Physical, chart, labs and discussed the procedure including the risks, benefits and alternatives for the proposed anesthesia with the patient or authorized representative who has indicated his/her understanding and acceptance.     Plan Discussed with: CRNA and Surgeon  Anesthesia Plan Comments:        Anesthesia Quick Evaluation

## 2011-01-14 NOTE — Anesthesia Procedure Notes (Addendum)
Procedure Name: LMA Insertion Date/Time: 01/14/2011 8:25 AM Performed by: Zenia Resides D Pre-anesthesia Checklist: Patient identified, Emergency Drugs available, Suction available and Patient being monitored Patient Re-evaluated:Patient Re-evaluated prior to inductionOxygen Delivery Method: Circle System Utilized Preoxygenation: Pre-oxygenation with 100% oxygen Intubation Type: IV induction Ventilation: Mask ventilation without difficulty LMA: LMA with gastric port inserted LMA Size: 4.0 Number of attempts: 1 Placement Confirmation: positive ETCO2 and breath sounds checked- equal and bilateral Tube secured with: Tape Dental Injury: Teeth and Oropharynx as per pre-operative assessment

## 2011-01-16 ENCOUNTER — Encounter (HOSPITAL_BASED_OUTPATIENT_CLINIC_OR_DEPARTMENT_OTHER): Payer: Self-pay | Admitting: General Surgery

## 2011-02-06 ENCOUNTER — Ambulatory Visit (INDEPENDENT_AMBULATORY_CARE_PROVIDER_SITE_OTHER): Payer: BC Managed Care – PPO | Admitting: General Surgery

## 2011-02-06 ENCOUNTER — Encounter (INDEPENDENT_AMBULATORY_CARE_PROVIDER_SITE_OTHER): Payer: Self-pay | Admitting: General Surgery

## 2011-02-06 VITALS — BP 128/80 | HR 68 | Temp 96.9°F | Resp 16 | Ht 64.0 in | Wt 170.0 lb

## 2011-02-06 DIAGNOSIS — Z9889 Other specified postprocedural states: Secondary | ICD-10-CM

## 2011-02-06 NOTE — Progress Notes (Signed)
Subjective:     Patient ID: [name] [name], female   DOB: 1951/01/16, 60 y.o.   MRN: [id number]  HPI Patient returns 3 weeks following debridement of suture granuloma and removal of suture. She says that the wound is healing nicely now she has no complaints. It is almost completely healed.  Review of Systems     Objective:   Physical Exam There is a small area of open granulation tissue in the midline wound. It is about 2.5 cm long by about 6 mm wide. Granulation tissue is very healthy. It was cauterized with silver nitrate and redressed.    Assessment:     Open abdominal wound, status post debridement of suture granuloma. Healing nicely by secondary intention.    Plan:     At the patient's request, she will return to see me in 5 weeks for a wound check, which will hopefully be her final check up.

## 2011-02-06 NOTE — Patient Instructions (Signed)
Your abdominal wound is healing rapidly now without any complications. I anticipate that it will heal completely in 2-3 weeks. Continue bathing normally and keep a clean gauze bandage over the wound. Return to see me in about 5 weeks for a checkup. Hopefully it will be completely healed by then

## 2011-03-13 ENCOUNTER — Encounter (INDEPENDENT_AMBULATORY_CARE_PROVIDER_SITE_OTHER): Payer: Self-pay | Admitting: General Surgery

## 2011-03-13 ENCOUNTER — Ambulatory Visit (INDEPENDENT_AMBULATORY_CARE_PROVIDER_SITE_OTHER): Payer: BC Managed Care – PPO | Admitting: General Surgery

## 2011-03-13 VITALS — BP 150/78 | HR 76 | Temp 97.6°F | Resp 16 | Ht 64.0 in | Wt 170.0 lb

## 2011-03-13 DIAGNOSIS — Z9889 Other specified postprocedural states: Secondary | ICD-10-CM

## 2011-03-13 NOTE — Patient Instructions (Signed)
Return to see Dr. Derrell Lolling if any new problems arise. Continue annual followup with your bariatric surgeon in [place].

## 2011-03-13 NOTE — Progress Notes (Signed)
Subjective:     Patient ID: [name] [name], female   DOB: 1950/09/26, 61 y.o.   MRN: [id number]  HPI Patient returns for a final wound check. She states that all of her wounds have healed. She feels fine and has no complaints. She is going to followup with her bariatric surgeon [facility] on an annual basis with dilatations p.r.n.  Review of Systems     Objective:   Physical Exam Patient looks well. In good spirits.  Abdomen soft flat and nontender. All of her wounds have completely healed. The skin is healthy. There are no hernias.    Assessment:     Status post wound exploration and debridement of suture granuloma, now with complete healing by secondary intention.  Status post exploratory laparotomy for perforation of marginal ulcer at gastrojejunostomy with closure, patched, and temporary gastrostomy in gastric remnant. She is recovered from all this.  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   Angelia Mould. Derrell Lolling, M.D., [facility] General and Minimally invasive Surgery Breast and Colorectal Surgery Office:   [phone] Pager:   [phone]

## 2011-06-20 ENCOUNTER — Ambulatory Visit (INDEPENDENT_AMBULATORY_CARE_PROVIDER_SITE_OTHER): Payer: BC Managed Care – PPO | Admitting: Obstetrics and Gynecology

## 2011-06-20 ENCOUNTER — Encounter: Payer: Self-pay | Admitting: Obstetrics and Gynecology

## 2011-06-20 VITALS — BP 118/66 | Ht 64.0 in | Wt 171.0 lb

## 2011-06-20 DIAGNOSIS — N951 Menopausal and female climacteric states: Secondary | ICD-10-CM

## 2011-06-20 DIAGNOSIS — Z124 Encounter for screening for malignant neoplasm of cervix: Secondary | ICD-10-CM

## 2011-06-20 NOTE — Progress Notes (Addendum)
Last Pap: 06/18/10 wnl WNL: Yes Regular Periods:no Contraception: none  Monthly Breast exam:yes Tetanus<19yrs:yes Nl.Bladder Function:yes Daily BMs:yes Healthy Diet:yes Calcium:yes Mammogram:yes Exercise:yes Seatbelt: no Abuse at home: no Stressful work:no Sigmoid-colonoscopy: 2008 Dr.Mann polyps Bone Density: Yes osteopenia   PMH: no change FMH: no change  Irreg Periods: no Mood Swings: no Hot Flashes: yes Vaginal Dryness: yes Poor Sleeping: no Urinary Urgency: no UTI Symptoms: no HRT: no Fam Hx Osteo: yes mother Prior Bone Scan: Yes 5/10 osteopenia Osteoporosis: No Hx of Dvt: No Physical Examination: Neck - supple, no significant adenopathy Chest - clear to auscultation, no wheezes, rales or rhonchi, symmetric air entry Heart - normal rate, regular rhythm, normal S1, S2, no murmurs, rubs, clicks or gallops Abdomen - soft, nontender, nondistended, no masses or organomegaly Breasts - breasts appear normal, no suspicious masses, no skin or nipple changes or axillary nodes Pelvic - normal external genitalia, vulva, vagina, cervix, uterus and adnexa, atrophic Rectal - normal rectal, no masses Musculoskeletal - no joint tenderness, deformity or swelling Extremities - peripheral pulses normal, no pedal edema, no clubbing or cyanosis Skin - normal coloration and turgor, no rashes, no suspicious skin lesions noted Normal AEX Pt due for mammogram will schedule Schedule dexa scan Pt declined HRT colonoscopy due in 8 yrs Pap done next due in three years RT one year Diet and exercise discussed

## 2011-06-24 LAB — PAP IG W/ RFLX HPV ASCU

## 2011-07-02 ENCOUNTER — Telehealth: Payer: Self-pay | Admitting: Obstetrics and Gynecology

## 2011-07-02 NOTE — Telephone Encounter (Signed)
niccole/epic °

## 2011-07-04 ENCOUNTER — Other Ambulatory Visit: Payer: Self-pay

## 2011-07-04 MED ORDER — CALCITONIN (SALMON) 200 UNIT/ACT NA SOLN: 1.0000 | Freq: Every day | NASAL | Status: DC

## 2011-07-07 ENCOUNTER — Other Ambulatory Visit: Payer: Self-pay

## 2012-02-01 ENCOUNTER — Ambulatory Visit: Payer: BC Managed Care – PPO

## 2012-02-01 ENCOUNTER — Ambulatory Visit (INDEPENDENT_AMBULATORY_CARE_PROVIDER_SITE_OTHER): Payer: BC Managed Care – PPO | Admitting: Internal Medicine

## 2012-02-01 VITALS — BP 148/84 | HR 80 | Temp 98.8°F | Resp 16 | Ht 64.0 in | Wt 186.6 lb

## 2012-02-01 DIAGNOSIS — K219 Gastro-esophageal reflux disease without esophagitis: Secondary | ICD-10-CM | POA: Insufficient documentation

## 2012-02-01 DIAGNOSIS — E785 Hyperlipidemia, unspecified: Secondary | ICD-10-CM | POA: Insufficient documentation

## 2012-02-01 DIAGNOSIS — M8580 Other specified disorders of bone density and structure, unspecified site: Secondary | ICD-10-CM | POA: Insufficient documentation

## 2012-02-01 DIAGNOSIS — J189 Pneumonia, unspecified organism: Secondary | ICD-10-CM

## 2012-02-01 DIAGNOSIS — R059 Cough, unspecified: Secondary | ICD-10-CM

## 2012-02-01 DIAGNOSIS — R05 Cough: Secondary | ICD-10-CM

## 2012-02-01 DIAGNOSIS — E119 Type 2 diabetes mellitus without complications: Secondary | ICD-10-CM | POA: Insufficient documentation

## 2012-02-01 DIAGNOSIS — J329 Chronic sinusitis, unspecified: Secondary | ICD-10-CM

## 2012-02-01 DIAGNOSIS — E1169 Type 2 diabetes mellitus with other specified complication: Secondary | ICD-10-CM | POA: Insufficient documentation

## 2012-02-01 LAB — POCT CBC
Granulocyte percent: 44.3 %G (ref 37–80)
HCT, POC: 47.1 % (ref 37.7–47.9)
Hemoglobin: 14.4 g/dL (ref 12.2–16.2)
Lymph, poc: 2.5 (ref 0.6–3.4)
MCH, POC: 28.5 pg (ref 27–31.2)
MCHC: 30.6 g/dL — AB (ref 31.8–35.4)
MCV: 93.3 fL (ref 80–97)
MID (cbc): 0.5 (ref 0–0.9)
MPV: 8.1 fL (ref 0–99.8)
POC Granulocyte: 2.4 (ref 2–6.9)
POC LYMPH PERCENT: 46.7 %L (ref 10–50)
POC MID %: 9 %M (ref 0–12)
Platelet Count, POC: 379 10*3/uL (ref 142–424)
RBC: 5.05 M/uL (ref 4.04–5.48)
RDW, POC: 13.3 %
WBC: 5.4 10*3/uL (ref 4.6–10.2)

## 2012-02-01 MED ORDER — GUAIFENESIN ER 1200 MG PO TB12: 1.0000 | ORAL_TABLET | Freq: Two times a day (BID) | ORAL | Status: DC | PRN

## 2012-02-01 MED ORDER — BENZONATATE 100 MG PO CAPS: 100.0000 mg | ORAL_CAPSULE | Freq: Three times a day (TID) | ORAL | Status: DC | PRN

## 2012-02-01 MED ORDER — CLARITHROMYCIN ER 500 MG PO TB24: 1000.0000 mg | ORAL_TABLET | Freq: Every day | ORAL | Status: DC

## 2012-02-01 MED ORDER — IPRATROPIUM BROMIDE 0.03 % NA SOLN: 2.0000 | Freq: Two times a day (BID) | NASAL | Status: DC

## 2012-02-01 NOTE — Patient Instructions (Signed)
Get plenty of rest and drink at least 64 ounces of water daily. 

## 2012-02-01 NOTE — Progress Notes (Signed)
Subjective:    Patient ID: [place] G Penaflor, female    DOB: 01-11-1951, 61 y.o.   MRN: [id number]  HPI This 61 y.o. female presents for evaluation of cough x 1 week.  Began as a sore throat, now resolved.  Lots of drainage in the sinuses.  Cough produces a gray sputum, and causes SOB and chest soreness, gagging.  No stress incontinence.  No fever, chills. No diarrhea, unexplained myalgias/arthralgias.  Past Medical History  Diagnosis Date  . Heart murmur   . Diabetes mellitus without complication   . Osteopenia     Past Surgical History  Procedure Date  . Cesarean section   . Tummy tuck   . Tonsillectomy   . Foot surgery   . Gastric bypass   . Wound debridement 01/14/2011    Procedure: DEBRIDEMENT ABDOMINAL WOUND;  Surgeon: Ernestene Mention, MD;  Location: [facility];  Service: General;  Laterality: N/A;  wound debridement and debridement on the abdomen  . Cosmetic surgery     Prior to Admission medications   Medication Sig Start Date End Date Taking? Authorizing Provider  Ascorbic Acid (VITAMIN C) 500 MG tablet Take 500 mg by mouth daily.     Yes Historical Provider, MD  aspirin 81 MG tablet Take 81 mg by mouth as needed.    Yes Historical Provider, MD  BIOTIN PO Take by mouth. Sometimes   Yes Historical Provider, MD  calcitonin, salmon, (MIACALCIN) 200 UNIT/ACT nasal spray Place 1 spray into the nose daily. 07/04/11 07/03/12 Yes Naima A Dillard, MD  KRILL OIL PO Take by mouth daily. Taking Mega Red brand    Yes Historical Provider, MD  Multiple Vitamin (MULTIVITAMIN PO) Take by mouth.     Yes Historical Provider, MD  pantoprazole (PROTONIX) 40 MG tablet Take 40 mg by mouth daily.     Yes Historical Provider, MD  rosuvastatin (CRESTOR) 10 MG tablet Take 5 mg by mouth every 3 (three) days. 3 days a week    Yes Historical Provider, MD  VITAMIN D, ERGOCALCIFEROL, PO Take by mouth.     Yes Historical Provider, MD    Allergies  Allergen Reactions  . Darvon Hives  and Itching    All over body  . Penicillins Hives and Itching    All over body    History   Social History  . Marital Status: Married    Spouse Name: Joe    Number of Children: 2  . Years of Education: 18   Occupational History  . 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 Smoking status: Former Games developer  . Smokeless tobacco: Never Used  . Alcohol Use: 1.2 - 1.8 oz/week    2-3 Glasses of wine per week  . Drug Use: No  . Sexually Active: Yes -- Female partner(s)    Birth Control/ Protection: Post-menopausal   Family History  Problem Relation Age of Onset  . Heart attack Father   . Heart attack Brother   . Cancer Sister     Review of Systems As above.    Objective:   Physical Exam Blood pressure 148/84, pulse 80, temperature 98.8 F (37.1 C), temperature source Oral, resp. rate 16, height 5\' 4"  (1.626 m), weight 186 lb 9.6 oz (84.641 kg), SpO2 98.00%. Body mass index is 32.03 kg/(m^2). Well-developed, well nourished BF who is awake, alert and oriented, in NAD. HEENT: Ebro/AT, PERRL, EOMI.  Sclera and  conjunctiva are clear.  EAC are patent, TMs are normal in appearance. Nasal mucosa is pink and moist. OP is clear. Neck: supple, non-tender, no lymphadenopathy, thyromegaly. Heart: RRR, no murmur Lungs: normal effort, bibasilar rhonchi, L>R.  Abdomen: normo-active bowel sounds, supple, non-tender, no mass or organomegaly. Extremities: no cyanosis, clubbing or edema. Skin: warm and dry without rash. Psychologic: good mood and appropriate affect, normal speech and behavior.  Results for orders placed in visit on 02/01/12  POCT CBC      Component Value Range   WBC 5.4  4.6 - 10.2 K/uL   Lymph, poc 2.5  0.6 - 3.4   POC LYMPH PERCENT 46.7  10 - 50 %L   MID (cbc) 0.5  0 - 0.9   POC MID % 9.0  0 - 12 %M   POC Granulocyte 2.4  2 - 6.9   Granulocyte percent 44.3  37 - 80 %G   RBC 5.05  4.04 - 5.48 M/uL    Hemoglobin 14.4  12.2 - 16.2 g/dL   HCT, POC 16.1  09.6 - 47.9 %   MCV 93.3  80 - 97 fL   MCH, POC 28.5  27 - 31.2 pg   MCHC 30.6 (*) 31.8 - 35.4 g/dL   RDW, POC 04.5     Platelet Count, POC 379  142 - 424 K/uL   MPV 8.1  0 - 99.8 fL   CXR: [facility] reading (PRIMARY) by  Dr. Perrin Maltese.  Retrocardiac infiltrate.      Assessment & Plan:   1. Cough  POCT CBC, DG Chest 2 View, benzonatate (TESSALON) 100 MG capsule  2. Pneumonia  clarithromycin (BIAXIN XL) 500 MG 24 hr tablet  3. Sinusitis  ipratropium (ATROVENT) 0.03 % nasal spray, Guaifenesin (MUCINEX MAXIMUM STRENGTH) 1200 MG TB12, clarithromycin (BIAXIN XL) 500 MG 24 hr tablet   Supportive care.  Anticipatory guidance.

## 2012-03-15 ENCOUNTER — Other Ambulatory Visit: Payer: Self-pay | Admitting: Obstetrics and Gynecology

## 2012-03-15 DIAGNOSIS — Z803 Family history of malignant neoplasm of breast: Secondary | ICD-10-CM

## 2012-03-16 ENCOUNTER — Ambulatory Visit (HOSPITAL_COMMUNITY)
Admission: RE | Admit: 2012-03-16 | Discharge: 2012-03-16 | Disposition: A | Discharge status: Home / Self Care | Payer: BC Managed Care – PPO | Source: Ambulatory Visit | Attending: Obstetrics and Gynecology | Admitting: Obstetrics and Gynecology

## 2012-03-16 DIAGNOSIS — Z1231 Encounter for screening mammogram for malignant neoplasm of breast: Secondary | ICD-10-CM | POA: Insufficient documentation

## 2012-03-16 DIAGNOSIS — Z803 Family history of malignant neoplasm of breast: Secondary | ICD-10-CM

## 2012-03-17 ENCOUNTER — Other Ambulatory Visit: Payer: Self-pay | Admitting: Obstetrics and Gynecology

## 2012-03-17 DIAGNOSIS — R928 Other abnormal and inconclusive findings on diagnostic imaging of breast: Secondary | ICD-10-CM

## 2012-03-29 ENCOUNTER — Ambulatory Visit
Admission: RE | Admit: 2012-03-29 | Discharge: 2012-03-29 | Disposition: A | Discharge status: Home / Self Care | Payer: BC Managed Care – PPO | Source: Ambulatory Visit | Attending: Obstetrics and Gynecology | Admitting: Obstetrics and Gynecology

## 2012-03-29 ENCOUNTER — Other Ambulatory Visit: Payer: Self-pay | Admitting: Obstetrics and Gynecology

## 2012-03-29 DIAGNOSIS — R928 Other abnormal and inconclusive findings on diagnostic imaging of breast: Secondary | ICD-10-CM

## 2012-03-29 DIAGNOSIS — R921 Mammographic calcification found on diagnostic imaging of breast: Secondary | ICD-10-CM

## 2012-04-07 ENCOUNTER — Other Ambulatory Visit: Payer: BC Managed Care – PPO

## 2012-04-15 ENCOUNTER — Ambulatory Visit
Admission: RE | Admit: 2012-04-15 | Discharge: 2012-04-15 | Disposition: A | Discharge status: Home / Self Care | Payer: BC Managed Care – PPO | Source: Ambulatory Visit | Attending: Obstetrics and Gynecology | Admitting: Obstetrics and Gynecology

## 2012-04-15 ENCOUNTER — Other Ambulatory Visit: Payer: Self-pay | Admitting: Obstetrics and Gynecology

## 2012-04-15 ENCOUNTER — Other Ambulatory Visit (HOSPITAL_COMMUNITY): Payer: Self-pay | Admitting: Diagnostic Radiology

## 2012-04-15 DIAGNOSIS — R921 Mammographic calcification found on diagnostic imaging of breast: Secondary | ICD-10-CM

## 2012-04-15 DIAGNOSIS — C50919 Malignant neoplasm of unspecified site of unspecified female breast: Secondary | ICD-10-CM

## 2012-04-15 HISTORY — DX: Malignant neoplasm of unspecified site of unspecified female breast: C50.919

## 2012-04-16 ENCOUNTER — Other Ambulatory Visit: Payer: Self-pay | Admitting: Obstetrics and Gynecology

## 2012-04-16 ENCOUNTER — Ambulatory Visit
Admission: RE | Admit: 2012-04-16 | Discharge: 2012-04-16 | Disposition: A | Discharge status: Home / Self Care | Payer: BC Managed Care – PPO | Source: Ambulatory Visit | Attending: Obstetrics and Gynecology | Admitting: Obstetrics and Gynecology

## 2012-04-16 DIAGNOSIS — C50912 Malignant neoplasm of unspecified site of left female breast: Secondary | ICD-10-CM

## 2012-04-16 DIAGNOSIS — R921 Mammographic calcification found on diagnostic imaging of breast: Secondary | ICD-10-CM

## 2012-04-20 ENCOUNTER — Other Ambulatory Visit (INDEPENDENT_AMBULATORY_CARE_PROVIDER_SITE_OTHER): Payer: Self-pay | Admitting: General Surgery

## 2012-04-20 ENCOUNTER — Telehealth: Payer: Self-pay | Admitting: *Deleted

## 2012-04-20 ENCOUNTER — Encounter (INDEPENDENT_AMBULATORY_CARE_PROVIDER_SITE_OTHER): Payer: Self-pay | Admitting: General Surgery

## 2012-04-20 ENCOUNTER — Ambulatory Visit (INDEPENDENT_AMBULATORY_CARE_PROVIDER_SITE_OTHER): Payer: BC Managed Care – PPO | Admitting: General Surgery

## 2012-04-20 VITALS — BP 134/82 | HR 76 | Temp 97.8°F | Resp 18 | Ht 64.0 in | Wt 188.4 lb

## 2012-04-20 DIAGNOSIS — C50919 Malignant neoplasm of unspecified site of unspecified female breast: Secondary | ICD-10-CM

## 2012-04-20 DIAGNOSIS — C50912 Malignant neoplasm of unspecified site of left female breast: Secondary | ICD-10-CM | POA: Insufficient documentation

## 2012-04-20 NOTE — Patient Instructions (Signed)
You have been diagnosed with multifocal cancer of the left breast. You had invasive cancer behind the nipple, and (at least) noninvasive cancer in the lower outer quadrant.  You have a strong family history for breast cancer in your mother and in your sister. Genetic counseling should be considered at some point for the reasons we discussed  We have talked about all treatment options, and you have decided tentatively that you would like to proceed with bilateral total mastectomy, left axillary sentinel node biopsy, and immediate reconstruction, if possible.  You will be seen by a plastic surgeon tomorrow for preop counseling.  You will also be referred to a medical oncologist who specializes in breast cancer to discuss the nonsurgical issues.  We will try to schedule your definitive surgery in 3 or 4 weeks.  Return to see Dr. Derrell Lolling in 2-3 weeks for final discussion.

## 2012-04-20 NOTE — Telephone Encounter (Signed)
Left message for pt to return my call to schedule her w/ Dr. Darnelle Catalan.

## 2012-04-20 NOTE — Progress Notes (Addendum)
Patient ID: [name] [name], female   DOB: 08/29/1950, 62 y.o.   MRN: [id number]  Chief Complaint  Patient presents with  . Breast Cancer    HPI [name] [name] is a 62 y.o. female.  She is referred back to me by Rosalie Gums at the breast center of [facility] for evaluation and surgical management of cancer of the left breast.  Past history is negative for any breast disease, but I have taken care of her for a perforated marginal ulcer in March of 2012 at the gastrojejunostomy, complicating a Roux-en-Y gastric bypass that was done in [place]. She has recovered from that surgery and is doing well.  She has not had mammograms in 2 years, and recently went for screening mammograms. These showed 2 areas of worrisome calcifications in the left breast, one behind the areola and one in the lower outer quadrant. There was also a non-calcified density possibly in the right breast in the deep inner quadrant not seen on ultrasound. 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 Breast diagnostic profile is pending. MRI was scheduled for tomorrow.  Family history reveals breast cancer in a sister who died of metastatic disease in her 19s and breast cancer in the mother who died in her early 95s of other causes. There is no family history of ovarian cancer. The patient has one daughter who is healthy.  Past history is significant for C-section, but she still has her uterus and ovaries. Roux-en-Y gastric bypass. Perforated marginal ulcer. Diet-controlled diabetes. Hyperlipidemia. Debridement of suture granuloma, now healed.  We talked for a very long time with her and her husband. Because of her family history and the multifocal nature of disease in her left breast, she knows that she will need a left mastectomy and so she has stated she would like to have bilateral mastectomies with immediate  reconstruction. We talked about these issues at great length. We talked about genetic counseling and testing which should be done and she knows it is important and has decided to do that later. HPI  Past Medical History  Diagnosis Date  . Heart murmur   . Diabetes mellitus without complication   . Osteopenia   . Cancer     breast  . Bronchitis   . Pneumonia   . Hiatal hernia   . Sinus problem     Past Surgical History  Procedure Laterality Date  . Cesarean section  1981, 1983  . Tummy tuck  1999  . Tonsillectomy    . Foot surgery    . Gastric bypass    . Wound debridement  01/14/2011    Procedure: DEBRIDEMENT ABDOMINAL WOUND;  Surgeon: Ernestene Mention, MD;  Location: [facility];  Service: General;  Laterality: N/A;  wound debridement and debridement on the abdomen  . Cosmetic surgery      Family History  Problem Relation Age of Onset  . Heart attack Father   . Heart attack Brother   . Cancer Sister   . Cancer Mother     breast    Social History History  Substance Use Topics  . Smoking status: Former Games developer  . Smokeless tobacco: Never Used  . Alcohol Use: 1.2 - 1.8 oz/week    2-3 Glasses of wine per week    Allergies  Allergen Reactions  . Darvon Hives and Itching    All over body  .  Penicillins Hives and Itching    All over body    Current Outpatient Prescriptions  Medication Sig Dispense Refill  . Ascorbic Acid (VITAMIN C) 500 MG tablet Take 500 mg by mouth daily.        Marland Kitchen aspirin 81 MG tablet Take 81 mg by mouth as needed.       . benzonatate (TESSALON) 100 MG capsule Take 1-2 capsules (100-200 mg total) by mouth 3 (three) times daily as needed for cough.  40 capsule  0  . BIOTIN PO Take by mouth. Sometimes      . calcitonin, salmon, (MIACALCIN) 200 UNIT/ACT nasal spray Place 1 spray into the nose daily.  3.7 mL  12  . clarithromycin (BIAXIN XL) 500 MG 24 hr tablet Take 2 tablets (1,000 mg total) by mouth daily.  20 tablet  0  .  Guaifenesin (MUCINEX MAXIMUM STRENGTH) 1200 MG TB12 Take 1 tablet (1,200 mg total) by mouth every 12 (twelve) hours as needed.  14 tablet  1  . ipratropium (ATROVENT) 0.03 % nasal spray Place 2 sprays into the nose 2 (two) times daily.  30 mL  0  . KRILL OIL PO Take by mouth daily. Taking Mega Red brand       . Multiple Vitamin (MULTIVITAMIN PO) Take by mouth.        . pantoprazole (PROTONIX) 40 MG tablet Take 40 mg by mouth daily.        . rosuvastatin (CRESTOR) 10 MG tablet Take 5 mg by mouth every 3 (three) days. 3 days a week       . VITAMIN D, ERGOCALCIFEROL, PO Take by mouth.         No current facility-administered medications for this visit.    Review of Systems Review of Systems  Constitutional: Negative for fever, chills and unexpected weight change.  HENT: Negative for hearing loss, congestion, sore throat, trouble swallowing and voice change.   Eyes: Negative for visual disturbance.  Respiratory: Negative for cough and wheezing.   Cardiovascular: Negative for chest pain, palpitations and leg swelling.  Gastrointestinal: Negative for nausea, vomiting, abdominal pain, diarrhea, constipation, blood in stool, abdominal distention and anal bleeding.  Genitourinary: Negative for hematuria, vaginal bleeding and difficulty urinating.  Musculoskeletal: Negative for arthralgias.  Skin: Negative for rash and wound.  Neurological: Negative for seizures, syncope and headaches.  Hematological: Negative for adenopathy. Does not bruise/bleed easily.  Psychiatric/Behavioral: Negative for confusion.    Blood pressure 134/82, pulse 76, temperature 97.8 F (36.6 C), temperature source Temporal, resp. rate 18, height 5\' 4"  (1.626 m), weight 188 lb 6 oz (85.446 kg).  Physical Exam Physical Exam  Constitutional: She is oriented to person, place, and time. She appears well-developed and well-nourished. No distress.  HENT:  Head: Normocephalic and atraumatic.  Nose: Nose normal.  Mouth/Throat:  No oropharyngeal exudate.  Eyes: Conjunctivae and EOM are normal. Pupils are equal, round, and reactive to light. Left eye exhibits no discharge. No scleral icterus.  Neck: Neck supple. No JVD present. No tracheal deviation present. No thyromegaly present.  Cardiovascular: Normal rate, regular rhythm, normal heart sounds and intact distal pulses.   No murmur heard. Pulmonary/Chest: Effort normal and breath sounds normal. No respiratory distress. She has no wheezes. She has no rales. She exhibits no tenderness.  Breasts are large. No skin change. Areolar complex is normal. No palpable mass. No axillary adenopathy. Essentially no hematoma or ecchymoses left breast.  Abdominal: Soft. Bowel sounds are normal. She exhibits no distension  and no mass. There is no tenderness. There is no rebound and no guarding.  Well-healed upper midline incision. No hernia.  Musculoskeletal: She exhibits no edema and no tenderness.  Lymphadenopathy:    She has no cervical adenopathy.  Neurological: She is alert and oriented to person, place, and time. She exhibits normal muscle tone. Coordination normal.  Skin: Skin is warm. No rash noted. She is not diaphoretic. No erythema. No pallor.  Psychiatric: She has a normal mood and affect. Her behavior is normal. Judgment and thought content normal.    Data Reviewed I reviewed her mammograms, ultrasound, and pathology report. Breast diagnostic profile is pending. I reviewed all of my old records.  Assessment    Multifocal carcinoma left breast, invasive ductal subareolar, DCIS lower outer. Breast diagnostic protocol pending  Impression of density right breast, inner, indeed, 1 cm, not seen on ultrasound  Strong family history of breast cancer in 2 first-line relatives genetic counseling indicated at some point  History Roux-en-Y gastric bypass for morbid obesity  History of perforated marginal ulcer  Diet-controlled diabetes  Hyperlipidemia  History cesarean  section     Plan    A long discussion regarding options for management. She will need mastectomy and sentinel node on the left because of multifocal disease. She desires  bilateral mastectomy and immediate reconstruction if possible. We talked about that at great length.  She will be referred immediately for plastic surgical consultation for consideration of immediate reconstruction following bilateral mastectomy  We will cancel her MRI since she is going to have bilateral mastectomy  We talked about genetic counseling and testing which will be important in terms of prophylactic mastectomy and for advice to her daughter regarding risk reduction and screening. She said that she will do that later which I think is reasonable  I offered to send her to a medical oncologist preop and she would like to do that and so we're going to arrange for a preop medical oncology consultation. She knows that further decisions will need to be made depending on hormone receptor status and node status. Hopefully she will not need radiation therapy.        Angelia Mould. Derrell Lolling, M.D., [facility] General and Minimally invasive Surgery Breast and Colorectal Surgery Office:   [phone] Pager:   [phone]  04/20/2012, 3:06 PM

## 2012-04-21 ENCOUNTER — Other Ambulatory Visit: Payer: BC Managed Care – PPO

## 2012-04-21 ENCOUNTER — Other Ambulatory Visit (INDEPENDENT_AMBULATORY_CARE_PROVIDER_SITE_OTHER): Payer: Self-pay | Admitting: General Surgery

## 2012-04-21 ENCOUNTER — Telehealth: Payer: Self-pay | Admitting: *Deleted

## 2012-04-21 DIAGNOSIS — C50912 Malignant neoplasm of unspecified site of left female breast: Secondary | ICD-10-CM

## 2012-04-21 NOTE — Telephone Encounter (Signed)
Pt returned my call and I confirmed 04/27/12 appt w/ pt.  Mailed before appt letter & packet to pt.  Emailed Dana at [facility] to make aware.  Took paperwork to Med [facility] for chart.

## 2012-04-27 ENCOUNTER — Other Ambulatory Visit: Payer: BC Managed Care – PPO

## 2012-04-27 ENCOUNTER — Other Ambulatory Visit: Payer: Self-pay | Admitting: *Deleted

## 2012-04-27 ENCOUNTER — Ambulatory Visit: Payer: BC Managed Care – PPO

## 2012-04-27 ENCOUNTER — Ambulatory Visit (HOSPITAL_BASED_OUTPATIENT_CLINIC_OR_DEPARTMENT_OTHER): Payer: BC Managed Care – PPO | Admitting: Oncology

## 2012-04-27 ENCOUNTER — Encounter: Payer: Self-pay | Admitting: Oncology

## 2012-04-27 VITALS — BP 141/84 | HR 69 | Temp 98.2°F | Resp 20 | Ht 64.0 in | Wt 189.1 lb

## 2012-04-27 DIAGNOSIS — D0590 Unspecified type of carcinoma in situ of unspecified breast: Secondary | ICD-10-CM

## 2012-04-27 DIAGNOSIS — C50112 Malignant neoplasm of central portion of left female breast: Secondary | ICD-10-CM

## 2012-04-27 DIAGNOSIS — Z170 Estrogen receptor positive status [ER+]: Secondary | ICD-10-CM

## 2012-04-27 DIAGNOSIS — C50119 Malignant neoplasm of central portion of unspecified female breast: Secondary | ICD-10-CM

## 2012-04-27 NOTE — Progress Notes (Signed)
[name] [name] Cong [id number] 01/26/1951 62 y.o. 04/27/2012 4:06 PM  CC  Jaymes Graff A, MD [address] [place] [address] Dr. 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 Patient was seen in the  Breast Clinic for discussion of her treatment options.  STAGE:   No matching staging information was found for the patient.  REFERRING PHYSICIAN: Dr. Claud Kelp  HISTORY OF PRESENT ILLNESS:  [name] [name] Littler is a 62 y.o. female.  With medical history significant for diabetes, heart murmurs, hiatal hernia. Patient's labs normal mammogram was 2 years ago. This year she went on to have a screening mammogram performed that showed 2 areas of concern on the left side.calcifications were noted in the left. In the right breast a possible mass warrants in further evaluation. On 03/29/2012 patient underwent a bilateral diagnostic mammogram and right breast ultrasound. A spot magnification images demonstrate suspicious group of pleomorphic microcalcifications over the outer lower left breast with additional suspicious group of pleomorphic microcalcifications over the outer midportion of the left periareolar region. Spot compression images of the right breast demonstrate persistence of a 1 cm density at the edge of the film in the deep third of the right inner breast. Ultrasound performed showed no focal abnormality over the entire in her right breast to correspond to the mammographic density. Patient was recommended stereotactic core needle biopsy of the 2 groups of suspicious left breast microcalcifications. Because patient and her husband wanted bilateral mastectomies MRI was not performed.on 04/15/2012 patient had needle core biopsy performed of the 2 areas in the left breast. The left needle core biopsy in the lower breast revealed ductal carcinoma in situ  with associated comedo necrosis and calcifications with the in situ carcinoma. It was ER +100% PR +12%. 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 Tumor was ER +100% PR +11% proliferation marker Ki-67 elevated at 80% HER-2/neu showed amplification with a ratio of 6.50. Patient was seen by Dr. Claud Kelp. Patient and her family desire bilateral mastectomies with immediate reconstruction. She is now seen in medical oncology for discussion of adjuvant therapy since patient does have a HER-2 positive ER positive breast cancer. Overall she's doing well she is accompanied by her husband and she is without any complaints. Her case was discussed at the multidisciplinary breast conference. Pathology and radiology were reviewed.  Past Medical History: Past Medical History  Diagnosis Date  . Heart murmur   . Diabetes mellitus without complication   . Osteopenia   . Cancer     breast  . Bronchitis   . Pneumonia   . Hiatal hernia   . Sinus problem     Past Surgical History: Past Surgical History  Procedure Laterality Date  . Cesarean section  1981, 1983  . Tummy tuck  1999  . Tonsillectomy    . Foot surgery    . Gastric bypass    . Wound debridement  01/14/2011    Procedure: DEBRIDEMENT ABDOMINAL WOUND;  Surgeon: Ernestene Mention, MD;  Location: [facility];  Service: General;  Laterality: N/A;  wound debridement and debridement on the abdomen  . Cosmetic surgery      Family History: Family History  Problem Relation Age of Onset  . Heart attack Father   . Heart attack Brother   . Cancer Sister   . Cancer  Mother     breast    Social History History  Substance Use Topics  . Smoking status: Former Games developer  . Smokeless tobacco: Never Used  . Alcohol Use: 1.2 - 1.8 oz/week    2-3 Glasses of wine per week    Allergies: Allergies  Allergen Reactions  . Darvon Hives and Itching    All over body  .  Penicillins Hives and Itching    All over body    Current Medications: Current Outpatient Prescriptions  Medication Sig Dispense Refill  . Ascorbic Acid (VITAMIN C) 500 MG tablet Take 500 mg by mouth daily.        Marland Kitchen aspirin 81 MG tablet Take 81 mg by mouth as needed.       Marland Kitchen BIOTIN PO Take by mouth. Sometimes      . calcitonin, salmon, (MIACALCIN) 200 UNIT/ACT nasal spray Place 1 spray into the nose daily.  3.7 mL  12  . KRILL OIL PO Take by mouth daily. Taking Mega Red brand       . Multiple Vitamin (MULTIVITAMIN PO) Take by mouth.        . pantoprazole (PROTONIX) 40 MG tablet Take 40 mg by mouth daily.        . rosuvastatin (CRESTOR) 10 MG tablet Take 5 mg by mouth every 3 (three) days. 3 days a week       . VITAMIN D, ERGOCALCIFEROL, PO Take by mouth.        . benzonatate (TESSALON) 100 MG capsule Take 1-2 capsules (100-200 mg total) by mouth 3 (three) times daily as needed for cough.  40 capsule  0   No current facility-administered medications for this visit.    OB/GYN History: menarche at 16, menopause at 65, no HRT, G2P2  Fertility Discussion: N/A Prior History of Cancer: no prior history  Health Maintenance:  Colonoscopy yes 05/28/12 Bone Density 3 years ago Last PAP smear up to date  ECOG PERFORMANCE STATUS: 0 - Asymptomatic  Genetic Counseling/testing: due to patient's strong family history of breast cancer she will be referred to genetic counseling and testing.  REVIEW OF SYSTEMS:  A comprehensive review of systems was negative.  PHYSICAL EXAMINATION: Blood pressure 141/84, pulse 69, temperature 98.2 F (36.8 C), temperature source Oral, resp. rate 20, height 5\' 4"  (1.626 m), weight 189 lb 1.6 oz (85.775 kg).  ZOX:WRUEA, healthy, no distress, well nourished and well developed SKIN: skin color, texture, turgor are normal HEAD: Normocephalic EYES: PERRLA, EOMI EARS: External ears normal OROPHARYNX:no exudate, no erythema and lips, buccal mucosa, and tongue  normal  NECK: supple, no adenopathy LYMPH:  no palpable lymphadenopathy, no hepatosplenomegaly BREAST:abnormal mass palpable palpable hematoma no other masses right breast no masses or nipple discharge the LUNGS: clear to auscultation  HEART: regular rate & rhythm ABDOMEN:abdomen soft, non-tender, normal bowel sounds and no masses or organomegaly BACK: No CVA tenderness EXTREMITIES:no edema, no clubbing, no cyanosis  NEURO: alert & oriented x 3 with fluent speech, no focal motor/sensory deficits, gait normal, reflexes normal and symmetric     STUDIES/RESULTS: US Breast Right  03/29/2012  *RADIOLOGY REPORT*  Clinical Data:  Patient presents for additional views of both breasts as follow-up to a screening exam to evaluate left breast microcalcifications and possible right breast mass.  History of breast cancer in her mom at age 42 and sister at age 40.  DIGITAL DIAGNOSTIC BILATERAL MAMMOGRAM  AND RIGHT BREAST ULTRASOUND:  Comparison:  05/08/2009, 05/02/2008, 04/06/2007 and 04/01/2006  Findings:  ACR Breast Density Category 2: There is a scattered fibroglandular pattern.  Spot magnification images demonstrate a suspicious group of pleomorphic microcalcifications over the outer lower left breast with an additional suspicious group of pleomorphic microcalcifications over the outer midportion of the left periareolar region.  Spot compression images of the right breast demonstrate persistence of a 1 cm density at the edge of the film in the deep third of the right inner breast.  This is not seen on the screening MLO nor spot MLO view.  Ultrasound is performed, showing no focal abnormality over the entire inner right breast to correspond to the mammographic density.  IMPRESSION: Two suspicious groups of microcalcifications over the outer lower left breast and outer mid periareolar region.  Indeterminate 1 cm density over the deep third of the inner right breast not seen sonographically and difficult to  localize on the MLO view.  RECOMMENDATION: Recommend stereotactic core needle biopsy of the two groups of suspicious left breast microcalcifications.   The patient and her husband desire right breast MRI regardless of the left breast biopsy results for further evaluation of the indeterminate 1 cm right breast density.  Would wait to schedule breast MRI pending results of patient's left breast biopsy.  I have discussed the findings and recommendations with the patient. Results were also provided in writing at the conclusion of the visit.  BI-RADS CATEGORY 5:  Highly suggestive of malignancy - appropriate action should be taken.  Biopsy left breast is scheduled for 04/15/2012 a 01:00 p.m.   Original Report Authenticated By: Elberta Fortis, M.D.    [facility]  03/29/2012  *RADIOLOGY REPORT*  Clinical Data:  Patient presents for additional views of both breasts as follow-up to a screening exam to evaluate left breast microcalcifications and possible right breast mass.  History of breast cancer in her mom at age 43 and sister at age 76.  DIGITAL DIAGNOSTIC BILATERAL MAMMOGRAM  AND RIGHT BREAST ULTRASOUND:  Comparison:  05/08/2009, 05/02/2008, 04/06/2007 and 04/01/2006  Findings:  ACR Breast Density Category 2: There is a scattered fibroglandular pattern.  Spot magnification images demonstrate a suspicious group of pleomorphic microcalcifications over the outer lower left breast with an additional suspicious group of pleomorphic microcalcifications over the outer midportion of the left periareolar region.  Spot compression images of the right breast demonstrate persistence of a 1 cm density at the edge of the film in the deep third of the right inner breast.  This is not seen on the screening MLO nor spot MLO view.  Ultrasound is performed, showing no focal abnormality over the entire inner right breast to correspond to the mammographic density.  IMPRESSION: Two suspicious groups of  microcalcifications over the outer lower left breast and outer mid periareolar region.  Indeterminate 1 cm density over the deep third of the inner right breast not seen sonographically and difficult to localize on the MLO view.  RECOMMENDATION: Recommend stereotactic core needle biopsy of the two groups of suspicious left breast microcalcifications.   The patient and her husband desire right breast MRI regardless of the left breast biopsy results for further evaluation of the indeterminate 1 cm right breast density.  Would wait to schedule breast MRI pending results of patient's left breast biopsy.  I have discussed the findings and recommendations with the patient. Results were also provided in writing at the conclusion of the visit.  BI-RADS CATEGORY 5:  Highly suggestive of malignancy - appropriate action should be taken.  Biopsy left breast  is scheduled for 04/15/2012 a 01:00 p.m.   Original Report Authenticated By: Elberta Fortis, M.D.    [facility]  04/16/2012  *RADIOLOGY REPORT*  ESTABLISHED PATIENT OFFICE VISIT - LEVEL II [phone])  Chief Complaint:  The patient has two clusters of linear calcifications in the left breast.  Status post attempted guided core biopsies of both areas.  History:  Overnight, the patient reports feeling well.  She reports no significant bleeding bruising at the biopsy sites.  Exam:  Biopsy sites are clean and dry.  There is no visible ecchymosis or palpable hematoma and either at the biopsy sites.  Pathology:  1. Breast, left, needle core biopsy, lower  - DUCTAL CARCINOMA IN SITU.  2. Breast, left, needle core biopsy, subareolar left  - INVASIVE DUCTAL CARCINOMA .  - DUCTAL CARCINOMA IN SITU.  Assessment and Plan:  The pathology correlates well with the imaging appearance.  Excision is recommended.  Surgical consultation has been arranged for the patient with Dr. Derrell Lolling. Appointment is 04/20/2012.  MRI is scheduled 04/21/2012.  I met with the patient and her  husband.  We discussed the biopsy results and reviewed images.  The patient was given educational materials and questions were answered.   Original Report Authenticated By: Norva Pavlov, M.D.    Mm Lt Breast Bx W Loc Dev 1st Lesion Image Bx Spec Stereo Guide  04/15/2012  *RADIOLOGY REPORT*  Clinical Data:  The patient has suspicious microcalcifications in the left breast, and two sites.  STEREOTACTIC-GUIDED VACUUM ASSISTED BIOPSY OF THE LEFT BREAST (TWO SITES) AND SPECIMEN RADIOGRAPH  Comparison: Previous exams.  I met with the patient and we discussed the procedure of stereotactic-guided biopsy, including benefits and alternatives. We discussed the high likelihood of a successful procedure. We discussed the risks of the procedure, including infection, bleeding, tissue injury, clip migration, and inadequate sampling. Informed, written consent was given.  Using sterile technique, 2% lidocaine, stereotactic guidance, and a 9 gauge vacuum assisted device, biopsy was performed of calcifications in the lower outer quadrant of the left breast using a lateral approach.  Specimen radiograph was performed, showing calcifications to be present.  Specimens with calcifications are identified for pathology. At the conclusion of the procedure, a top hat shaped tissue marker clip was deployed into the biopsy cavity.  Using the same technique, calcifications in the subareolar lateral portion of the left breast are targeted and biopsied using a lateral approach.  Specimen radiograph confirms calcifications to be present.  Specimens with calcifications are identified for pathology. Following the procedure, a tri-mark shaped tissue marker clip is deployed into the biopsy cavity.   Follow-up 2-view mammogram confirmed clips in the expected locations, 6.7 cm apart.  IMPRESSION: Stereotactic-guided biopsy of two clusters of calcifications in the left breast.  No apparent complications.   Original Report Authenticated By: Norva Pavlov, M.D.    Mm Lt Breast Bx W Loc Dev Ea Ad Lesion Img Bx Spec Stereo Guide  04/15/2012  *RADIOLOGY REPORT*  Clinical Data:  The patient has suspicious microcalcifications in the left breast, and two sites.  STEREOTACTIC-GUIDED VACUUM ASSISTED BIOPSY OF THE LEFT BREAST (TWO SITES) AND SPECIMEN RADIOGRAPH  Comparison: Previous exams.  I met with the patient and we discussed the procedure of stereotactic-guided biopsy, including benefits and alternatives. We discussed the high likelihood of a successful procedure. We discussed the risks of the procedure, including infection, bleeding, tissue injury, clip migration, and inadequate sampling. Informed, written consent was given.  Using sterile  technique, 2% lidocaine, stereotactic guidance, and a 9 gauge vacuum assisted device, biopsy was performed of calcifications in the lower outer quadrant of the left breast using a lateral approach.  Specimen radiograph was performed, showing calcifications to be present.  Specimens with calcifications are identified for pathology. At the conclusion of the procedure, a top hat shaped tissue marker clip was deployed into the biopsy cavity.  Using the same technique, calcifications in the subareolar lateral portion of the left breast are targeted and biopsied using a lateral approach.  Specimen radiograph confirms calcifications to be present.  Specimens with calcifications are identified for pathology. Following the procedure, a tri-mark shaped tissue marker clip is deployed into the biopsy cavity.   Follow-up 2-view mammogram confirmed clips in the expected locations, 6.7 cm apart.  IMPRESSION: Stereotactic-guided biopsy of two clusters of calcifications in the left breast.  No apparent complications.   Original Report Authenticated By: Norva Pavlov, M.D.      LABS:    Chemistry      Component Value Date/Time   NA 142 01/13/2011 1222   K 4.5 01/13/2011 1222   CL 101 01/13/2011 1222   CO2 31 01/13/2011 1222    BUN 12 01/13/2011 1222   CREATININE 0.47* 01/13/2011 1222      Component Value Date/Time   CALCIUM 9.7 01/13/2011 1222   ALKPHOS 75 04/16/2010 0905   AST 26 04/16/2010 0905   ALT 26 04/16/2010 0905   BILITOT 0.5 04/16/2010 0905      Lab Results  Component Value Date   WBC 5.4 02/01/2012   HGB 14.4 02/01/2012   HCT 47.1 02/01/2012   MCV 93.3 02/01/2012   PLT 244 04/21/2010   PATHOLOGY: ADDITIONAL INFORMATION: 1. PROGNOSTIC INDICATORS - ACIS Results IMMUNOHISTOCHEMICAL AND MORPHOMETRIC ANALYSIS BY THE AUTOMATED CELLULAR IMAGING SYSTEM (ACIS) Estrogen Receptor (Negative, <1%): 100%, POSITIVE, STRONG STAINING INTENSITY Progesterone Receptor (Negative, <1%): 12%, POSITIVE, STRONG STAINING INTENSITY All controls stained appropriately Pecola Leisure MD Pathologist, Electronic Signature ( Signed 04/21/2012) 2. PROGNOSTIC INDICATORS - ACIS Results IMMUNOHISTOCHEMICAL AND MORPHOMETRIC ANALYSIS BY THE AUTOMATED CELLULAR IMAGING SYSTEM (ACIS) Estrogen Receptor (Negative, <1%): 100%, POSITIVE, STRONG STAINING INTENSITY Progesterone Receptor (Negative, <1%): 11%, POSITIVE, MODERATE STAINING INTENSITY Proliferation Marker Ki67 by M IB-1 (Low<20%): 80% All controls stained appropriately Pecola Leisure MD Pathologist, Electronic Signature ( Signed 04/21/2012) 2. CHROMOGENIC IN-SITU HYBRIDIZATION 1 of 3 Duplicate copy FINAL for Welker, [name] [name] ([id number]) ADDITIONAL INFORMATION:(continued) Interpretation: HER2/NEU BY CISH - SHOWS AMPLIFICATION BY CISH ANALYSIS. THE RATIO OF HER2: CEP 17 SIGNALS WAS 6.50. Reference range: Ratio: HER2:CEP17 < 1.8 gene amplification not observed Ratio: HER2:CEP 17 1.8-2.2 - equivocal result Ratio: HER2:CEP17 > 2.2 - gene amplification observed Pecola Leisure MD Pathologist, Electronic Signature ( Signed 04/20/2012) FINAL DIAGNOSIS Diagnosis 1. Breast, left, needle core biopsy, lower - DUCTAL CARCINOMA IN SITU. SEE COMMENT. 2. Breast, left, needle core  biopsy, subareolar left - INVASIVE DUCTAL CARCINOMA SEE COMMENT. - DUCTAL CARCINOMA IN SITU. Microscopic Comment 1. and 2. Although the grade of tumor is best assessed at resection, with these biopsies, the in situ and invasive carcinoma identified in parts 1 and 2 are grade II- III. There is associated comedo necrosis and calcifications with the in situ carcinoma. Breast prognostic studies are pending and reported in an addendum. The case was reviewed with   ASSESSMENT    62 year old female with  #1 new diagnosis of invasive ductal carcinoma and ductal carcinoma in situ of the left breast on screening mammogram. The invasive ductal carcinoma  is ER positive PR positive HER-2/neu positive with elevated Ki-67 of 80% between a grade 2 and 3. DCIS is ER positive PR positive. Patient has been seen by Dr. Claud Kelp and patient desires bilateral mastectomies with immediate reconstruction. We discussed this at length today. We also discussed adjuvant chemotherapy and HER-2 based therapy since patient's tumor is HER-2/neu positive. She will need chemotherapy consisting of Taxotere carboplatinum with Herceptin. I will plan on giving her a total of 6 cycles of Taxotere carboplatinum and one year of Herceptin therapy.  #2 patient will also be referred to radiation oncology.  #3 she will be referred to genetic counseling and testing since she does have a significant history of breast cancer in her mom as well as her sister.  Clinical Trial Eligibility:no Multidisciplinary conference discussion yes     PLAN:    #1 patient will proceed with bilateral mastectomies immediate reconstruction.  #2 I will refer her to radiation oncology and genetic counseling.  #3 patient will need an echocardiogram, chemotherapy class, staging studies, cardiology consultation. Porta cath placement.     Discussion: Patient is being treated per NCCN breast cancer care guidelines appropriate for stage.stage I her2neu  positive breast cancer   Thank you so much for allowing me to participate in the care of [name] [name] [place]. I will continue to follow up the patient with you and assist in her care.  All questions were answered. The patient knows to call the clinic with any problems, questions or concerns. We can certainly see the patient much sooner if necessary.  I spent 60 minutes counseling the patient face to face. The total time spent in the appointment was 60 minutes.   Drue Second, MD Medical/Oncology [facility] [phone] (beeper) [phone] (Office)  04/27/2012, 4:06 PM

## 2012-04-27 NOTE — Progress Notes (Signed)
Checked in new pt with no financial concerns. °

## 2012-04-27 NOTE — Patient Instructions (Addendum)
Proceed with surgery  I will see you back in 1 month 

## 2012-04-28 ENCOUNTER — Telehealth: Payer: Self-pay | Admitting: Oncology

## 2012-04-29 ENCOUNTER — Telehealth (INDEPENDENT_AMBULATORY_CARE_PROVIDER_SITE_OTHER): Payer: Self-pay | Admitting: General Surgery

## 2012-04-29 ENCOUNTER — Other Ambulatory Visit (INDEPENDENT_AMBULATORY_CARE_PROVIDER_SITE_OTHER): Payer: Self-pay | Admitting: General Surgery

## 2012-04-29 DIAGNOSIS — C50912 Malignant neoplasm of unspecified site of left female breast: Secondary | ICD-10-CM

## 2012-04-29 NOTE — Telephone Encounter (Signed)
Dr. Drue Second has decided the patient will need adjuvant chemotherapy. She has discussed this with the patient yesterday and advised her to have a Port-A-Cath. I discussed the Port-A-Cath with her this morning. We talked about the indications, details, techniques, and numerous risk of the surgery. She understands these issues and all of her questions are answered. She agrees with this plan. We'll add this onto the operative schedule.  Angelia Mould. Derrell Lolling, M.D., [facility] General and Minimally invasive Surgery Breast and Colorectal Surgery Office:   [phone] Pager:   [phone]

## 2012-04-29 NOTE — Telephone Encounter (Signed)
I spoke to the patient regarding her pathology today.

## 2012-04-30 ENCOUNTER — Telehealth: Payer: Self-pay | Admitting: Oncology

## 2012-04-30 NOTE — Telephone Encounter (Signed)
lmonvm for pt re appt for 3/18 and 4/2. Also confirmed 3/26 appt w/Dr. Michell Heinrich. Pt made aware she will be contacted re appt for echo. Echo order to [facility] for preauth. Message also left for pt's husband. Prior to leaving 3/12 pt gv me her husband number [phone]) to also lm w/him re appts.

## 2012-05-04 ENCOUNTER — Other Ambulatory Visit: Payer: BC Managed Care – PPO

## 2012-05-06 ENCOUNTER — Ambulatory Visit: Payer: BC Managed Care – PPO

## 2012-05-06 ENCOUNTER — Other Ambulatory Visit: Payer: BC Managed Care – PPO | Admitting: Lab

## 2012-05-06 ENCOUNTER — Ambulatory Visit: Payer: BC Managed Care – PPO | Admitting: Oncology

## 2012-05-10 ENCOUNTER — Other Ambulatory Visit: Payer: BC Managed Care – PPO

## 2012-05-10 ENCOUNTER — Telehealth (INDEPENDENT_AMBULATORY_CARE_PROVIDER_SITE_OTHER): Payer: Self-pay

## 2012-05-10 ENCOUNTER — Encounter (HOSPITAL_COMMUNITY)
Admission: RE | Admit: 2012-05-10 | Discharge: 2012-05-10 | Disposition: A | Discharge status: Home / Self Care | Payer: BC Managed Care – PPO | Source: Ambulatory Visit | Attending: General Surgery | Admitting: General Surgery

## 2012-05-10 ENCOUNTER — Encounter (HOSPITAL_COMMUNITY): Payer: Self-pay

## 2012-05-10 ENCOUNTER — Telehealth: Payer: Self-pay | Admitting: Oncology

## 2012-05-10 LAB — CBC WITH DIFFERENTIAL/PLATELET
Basophils Absolute: 0 10*3/uL (ref 0.0–0.1)
Basophils Relative: 1 % (ref 0–1)
Eosinophils Absolute: 0.1 10*3/uL (ref 0.0–0.7)
Eosinophils Relative: 3 % (ref 0–5)
HCT: 42.3 % (ref 36.0–46.0)
Hemoglobin: 14.5 g/dL (ref 12.0–15.0)
Lymphocytes Relative: 41 % (ref 12–46)
Lymphs Abs: 2.3 10*3/uL (ref 0.7–4.0)
MCH: 30 pg (ref 26.0–34.0)
MCHC: 34.3 g/dL (ref 30.0–36.0)
MCV: 87.4 fL (ref 78.0–100.0)
Monocytes Absolute: 0.6 10*3/uL (ref 0.1–1.0)
Monocytes Relative: 12 % (ref 3–12)
Neutro Abs: 2.4 10*3/uL (ref 1.7–7.7)
Neutrophils Relative %: 44 % (ref 43–77)
Platelets: 296 10*3/uL (ref 150–400)
RBC: 4.84 MIL/uL (ref 3.87–5.11)
RDW: 13 % (ref 11.5–15.5)
WBC: 5.5 10*3/uL (ref 4.0–10.5)

## 2012-05-10 LAB — URINE MICROSCOPIC-ADD ON

## 2012-05-10 LAB — COMPREHENSIVE METABOLIC PANEL
ALT: 17 U/L (ref 0–35)
AST: 25 U/L (ref 0–37)
Albumin: 4.1 g/dL (ref 3.5–5.2)
Alkaline Phosphatase: 103 U/L (ref 39–117)
BUN: 8 mg/dL (ref 6–23)
CO2: 30 mEq/L (ref 19–32)
Calcium: 10 mg/dL (ref 8.4–10.5)
Chloride: 104 mEq/L (ref 96–112)
Creatinine, Ser: 0.52 mg/dL (ref 0.50–1.10)
GFR calc Af Amer: >90 mL/min (ref >90)
GFR calc non Af Amer: >90 mL/min (ref >90)
Glucose, Bld: 151 mg/dL — ABNORMAL HIGH (ref 70–99)
Potassium: 3.5 mEq/L (ref 3.5–5.1)
Sodium: 145 mEq/L (ref 135–145)
Total Bilirubin: 0.4 mg/dL (ref 0.3–1.2)
Total Protein: 7.4 g/dL (ref 6.0–8.3)

## 2012-05-10 LAB — URINALYSIS, ROUTINE W REFLEX MICROSCOPIC
Bilirubin Urine: NEGATIVE
Glucose, UA: NEGATIVE mg/dL
Hgb urine dipstick: NEGATIVE
Ketones, ur: NEGATIVE mg/dL
Nitrite: NEGATIVE
Protein, ur: NEGATIVE mg/dL
Specific Gravity, Urine: 1.009 (ref 1.005–1.030)
Urobilinogen, UA: 0.2 mg/dL (ref 0.0–1.0)
pH: 7 (ref 5.0–8.0)

## 2012-05-10 LAB — SURGICAL PCR SCREEN
MRSA, PCR: NEGATIVE
Staphylococcus aureus: NEGATIVE

## 2012-05-10 MED ORDER — CHLORHEXIDINE GLUCONATE 4 % EX LIQD: 1.0000 "application " | Freq: Once | CUTANEOUS | Status: DC

## 2012-05-10 NOTE — Telephone Encounter (Signed)
The pt refused to get her preop chest xray because she just had one back in December.  The nurse wants to know if that is acceptable or they can just get another one the morning of surgery.

## 2012-05-10 NOTE — Pre-Procedure Instructions (Signed)
[place] G Xue  05/10/2012   Your procedure is scheduled on:  May 14, 2012  Report to [facility] at 6:30 AM.  Call this number if you have problems the morning of surgery: [phone]   Remember:   Do not eat food or drink liquids after midnight.   Take these medicines the morning of surgery with A SIP OF WATER: protonix   Do not wear jewelry, make-up or nail polish.  Do not wear lotions, powders, or perfumes. You may wear deodorant.  Do not shave 48 hours prior to surgery. Men may shave face and neck.  Do not bring valuables to the hospital.  Contacts, dentures or bridgework may not be worn into surgery.  Leave suitcase in the car. After surgery it may be brought to your room.  For patients admitted to the hospital, checkout time is 11:00 AM the day of  discharge.   Patients discharged the day of surgery will not be allowed to drive  home.  Name and phone number of your driver:   Special Instructions: Shower using CHG 2 nights before surgery and the night before surgery.  If you shower the day of surgery use CHG.  Use special wash - you have one bottle of CHG for all showers.  You should use approximately 1/3 of the bottle for each shower.   Please read over the following fact sheets that you were given: Pain Booklet, Coughing and Deep Breathing and Surgical Site Infection Prevention

## 2012-05-10 NOTE — Telephone Encounter (Signed)
lmonvm for pt re appt for echo @ [facility] failure [facility] 05/18/12  @ 10am and appt w/Dr. Gala Romney 4/21 @ 11:30am. Pt given appt d/t/location. Pt was aware that I would be calling her back w/the above appts. Above appts mailed.

## 2012-05-11 ENCOUNTER — Encounter (INDEPENDENT_AMBULATORY_CARE_PROVIDER_SITE_OTHER): Payer: BC Managed Care – PPO | Admitting: General Surgery

## 2012-05-11 ENCOUNTER — Telehealth: Payer: Self-pay | Admitting: Oncology

## 2012-05-11 LAB — CANCER ANTIGEN 27.29: CA 27.29: 9 U/mL (ref 0–39)

## 2012-05-11 NOTE — Telephone Encounter (Signed)
  I notified nurse Shalo of Dr Jacinto Halim message below.  The repeat cxr is not necessary.                                          Message    [place] Dec,      If she had a chest x-ray in December, she should not need another one at all.      The only reason that she would have to have a chest x-ray is its anesthesia insisted.      I did not take it is necessary.      hmi

## 2012-05-11 NOTE — Progress Notes (Signed)
Dr Derrell Lolling ok with CXR from 01/2012

## 2012-05-11 NOTE — Telephone Encounter (Signed)
Returned pt's call re echo/bensimhon and 4/2 f/u w/KK. Pt does not want to do echo. Pt states she had one back in July or August with her cardiologist Dr. Garnette Scheuermann and doesn't feel it necessary to do another one so soon. Also pt wants to cx and r/s 4/2 f/u and if she needs to see a cardiologist would prefer to see Dr.Smith. Emailed sent to KK re above and also copied to [facility] and [employer]. Pt aware.

## 2012-05-12 ENCOUNTER — Ambulatory Visit: Payer: BC Managed Care – PPO | Admitting: Radiation Oncology

## 2012-05-12 ENCOUNTER — Ambulatory Visit: Payer: BC Managed Care – PPO

## 2012-05-12 ENCOUNTER — Encounter (HOSPITAL_COMMUNITY)
Admission: RE | Admit: 2012-05-12 | Discharge: 2012-05-12 | Disposition: A | Discharge status: Home / Self Care | Payer: BC Managed Care – PPO | Source: Ambulatory Visit | Attending: Oncology | Admitting: Oncology

## 2012-05-12 DIAGNOSIS — C50919 Malignant neoplasm of unspecified site of unspecified female breast: Secondary | ICD-10-CM | POA: Insufficient documentation

## 2012-05-12 DIAGNOSIS — C50119 Malignant neoplasm of central portion of unspecified female breast: Secondary | ICD-10-CM

## 2012-05-12 DIAGNOSIS — R928 Other abnormal and inconclusive findings on diagnostic imaging of breast: Secondary | ICD-10-CM | POA: Insufficient documentation

## 2012-05-12 LAB — GLUCOSE, CAPILLARY: Glucose-Capillary: 143 mg/dL — ABNORMAL HIGH (ref 70–99)

## 2012-05-12 MED ORDER — FLUDEOXYGLUCOSE F - 18 (FDG) INJECTION
20.0000 | Freq: Once | INTRAVENOUS | Status: AC | PRN
  Administered 2012-05-12: 20 via INTRAVENOUS

## 2012-05-12 NOTE — H&P (Signed)
[name] [name]   MRN:  [id number]   Description: 62 year old female  Provider: Ernestene Mention, MD  Department: [facility]        Diagnoses    Cancer of left breast    -  Primary    174.9        Current Vitals - Last Recorded    BP Pulse Temp(Src) Resp Ht Wt    134/82 76 97.8 F (36.6 C) (Temporal) 18 5\' 4"  (1.626 m) 188 lb 6 oz (85.446 kg)    BMI - 32.32 kg/m2                  History and Physical   Ernestene Mention, MD     Status: Addendum                             HPI [name] [name] is a 62 y.o. female.  She is referred back to me by Rosalie Gums at the breast center of [facility] for evaluation and surgical management of cancer of the left breast.   Past history is negative for any breast disease, but I have taken care of her for a perforated marginal ulcer in March of 2012 at the gastrojejunostomy, complicating a Roux-en-Y gastric bypass that was done in [place]. She has recovered from that surgery and is doing well.   She has not had mammograms in 2 years, and recently went for screening mammograms. These showed 2 areas of worrisome calcifications in the left breast, one behind the areola and one in the lower outer quadrant. There was also a non-calcified density possibly in the right breast in the deep inner quadrant not seen on ultrasound. 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 Breast diagnostic profile is pending. MRI was scheduled for tomorrow.   Family history reveals breast cancer in a sister who died of metastatic disease in her 44s and breast cancer in the mother who died in her early 55s of other causes. There is no family history of ovarian cancer. The patient has one daughter who is healthy.   Past history is significant for C-section, but she still has her uterus and ovaries. Roux-en-Y gastric bypass.  Perforated marginal ulcer. Diet-controlled diabetes. Hyperlipidemia. Debridement of suture granuloma, now healed.   We talked for a very long time with her and her husband. Because of her family history and the multifocal nature of disease in her left breast, she knows that she will need a left mastectomy and so she has stated she would like to have bilateral mastectomies with immediate reconstruction. We talked about these issues at great length. We talked about genetic counseling and testing which should be done and she knows it is important and has decided to do that later.       Past Medical History   Diagnosis  Date   .  Heart murmur     .  Diabetes mellitus without complication     .  Osteopenia     .  Cancer         breast   .  Bronchitis     .  Pneumonia     .  Hiatal hernia     .  Sinus problem           Past Surgical History  Procedure  Laterality  Date   .  Cesarean section    1981, 1983   .  Tummy tuck    1999   .  Tonsillectomy       .  Foot surgery       .  Gastric bypass       .  Wound debridement    01/14/2011       Procedure: DEBRIDEMENT ABDOMINAL WOUND;  Surgeon: Ernestene Mention, MD;  Location: [facility];  Service: General;  Laterality: N/A;  wound debridement and debridement on the abdomen   .  Cosmetic surgery             Family History   Problem  Relation  Age of Onset   .  Heart attack  Father     .  Heart attack  Brother     .  Cancer  Sister     .  Cancer  Mother         breast        Social History History   Substance Use Topics   .  Smoking status:  Former Games developer   .  Smokeless tobacco:  Never Used   .  Alcohol Use:  1.2 - 1.8 oz/week       2-3 Glasses of wine per week         Allergies   Allergen  Reactions   .  Darvon  Hives and Itching       All over body   .  Penicillins  Hives and Itching       All over body         Current Outpatient Prescriptions   Medication  Sig  Dispense  Refill   .  Ascorbic  Acid (VITAMIN C) 500 MG tablet  Take 500 mg by mouth daily.           Marland Kitchen  aspirin 81 MG tablet  Take 81 mg by mouth as needed.          .  benzonatate (TESSALON) 100 MG capsule  Take 1-2 capsules (100-200 mg total) by mouth 3 (three) times daily as needed for cough.   40 capsule   0   .  BIOTIN PO  Take by mouth. Sometimes         .  calcitonin, salmon, (MIACALCIN) 200 UNIT/ACT nasal spray  Place 1 spray into the nose daily.   3.7 mL   12   .  clarithromycin (BIAXIN XL) 500 MG 24 hr tablet  Take 2 tablets (1,000 mg total) by mouth daily.   20 tablet   0   .  Guaifenesin (MUCINEX MAXIMUM STRENGTH) 1200 MG TB12  Take 1 tablet (1,200 mg total) by mouth every 12 (twelve) hours as needed.   14 tablet   1   .  ipratropium (ATROVENT) 0.03 % nasal spray  Place 2 sprays into the nose 2 (two) times daily.   30 mL   0   .  KRILL OIL PO  Take by mouth daily. Taking Mega Red brand          .  Multiple Vitamin (MULTIVITAMIN PO)  Take by mouth.           .  pantoprazole (PROTONIX) 40 MG tablet  Take 40 mg by mouth daily.           .  rosuvastatin (CRESTOR) 10 MG tablet  Take 5 mg by mouth every  3 (three) days. 3 days a week          .  VITAMIN D, ERGOCALCIFEROL, PO  Take by mouth.               No current facility-administered medications for this visit.        Review of Systems   Constitutional: Negative for fever, chills and unexpected weight change.  HENT: Negative for hearing loss, congestion, sore throat, trouble swallowing and voice change.   Eyes: Negative for visual disturbance.  Respiratory: Negative for cough and wheezing.   Cardiovascular: Negative for chest pain, palpitations and leg swelling.  Gastrointestinal: Negative for nausea, vomiting, abdominal pain, diarrhea, constipation, blood in stool, abdominal distention and anal bleeding.  Genitourinary: Negative for hematuria, vaginal bleeding and difficulty urinating.  Musculoskeletal: Negative for arthralgias.  Skin: Negative for rash and  wound.  Neurological: Negative for seizures, syncope and headaches.  Hematological: Negative for adenopathy. Does not bruise/bleed easily.  Psychiatric/Behavioral: Negative for confusion.      Blood pressure 134/82, pulse 76, temperature 97.8 F (36.6 C), temperature source Temporal, resp. rate 18, height 5\' 4"  (1.626 m), weight 188 lb 6 oz (85.446 kg).   Physical Exam  Constitutional: She is oriented to person, place, and time. She appears well-developed and well-nourished. No distress.  HENT:   Head: Normocephalic and atraumatic.   Nose: Nose normal.   Mouth/Throat: No oropharyngeal exudate.  Eyes: Conjunctivae and EOM are normal. Pupils are equal, round, and reactive to light. Left eye exhibits no discharge. No scleral icterus.  Neck: Neck supple. No JVD present. No tracheal deviation present. No thyromegaly present.  Cardiovascular: Normal rate, regular rhythm, normal heart sounds and intact distal pulses.    No murmur heard. Pulmonary/Chest: Effort normal and breath sounds normal. No respiratory distress. She has no wheezes. She has no rales. She exhibits no tenderness.  Breasts are large. No skin change. Areolar complex is normal. No palpable mass. No axillary adenopathy. Essentially no hematoma or ecchymoses left breast.  Abdominal: Soft. Bowel sounds are normal. She exhibits no distension and no mass. There is no tenderness. There is no rebound and no guarding.  Well-healed upper midline incision. No hernia.  Musculoskeletal: She exhibits no edema and no tenderness.  Lymphadenopathy:    She has no cervical adenopathy.  Neurological: She is alert and oriented to person, place, and time. She exhibits normal muscle tone. Coordination normal.  Skin: Skin is warm. No rash noted. She is not diaphoretic. No erythema. No pallor.  Psychiatric: She has a normal mood and affect. Her behavior is normal. Judgment and thought content normal.      Data Reviewed I reviewed her  mammograms, ultrasound, and pathology report. Breast diagnostic profile is pending. I reviewed all of my old records.   Assessment    Multifocal carcinoma left breast,   invasive ductal (subareolar),  DCIS( lower outer).    Breast diagnostic protocol pending   Vague, noncalcified density right breast, deep inner quadrant, 1 cm, not seen on ultrasound, low risk.   Strong family history of breast cancer in 2 first-line relatives genetic counseling indicated at some point   History Roux-en-Y gastric bypass for morbid obesity   History of perforated marginal ulcer   Diet-controlled diabetes   Hyperlipidemia   History cesarean section      Plan    A long discussion regarding options for management. She will need mastectomy and sentinel node on the left because of multifocal disease. She desires  bilateral mastectomy and immediate reconstruction if possible. We talked about that at great length.   She will be referred immediately for plastic surgical consultation for consideration of immediate reconstruction following bilateral mastectomy   We will cancel her MRI since she is going to have bilateral mastectomy   We talked about genetic counseling and testing which will be important in terms of prophylactic mastectomy and for advice to her daughter regarding risk reduction and screening. She said that she will do that later which I think is reasonable   I offered to send her to a medical oncologist preop and she would like to do that and so we're going to arrange for a preop medical oncology consultation. She knows that further decisions will need to be made depending on hormone receptor status and node status. Hopefully she will not need radiation therapy.           Angelia Mould. Derrell Lolling, M.D., [facility] General and Minimally invasive Surgery Breast and Colorectal Surgery Office:   [phone] Pager:   [phone]

## 2012-05-13 MED ORDER — VANCOMYCIN HCL IN DEXTROSE 1-5 GM/200ML-% IV SOLN
1000.0000 mg | INTRAVENOUS | Status: AC
  Administered 2012-05-14: 1000 mg via INTRAVENOUS

## 2012-05-13 NOTE — Progress Notes (Signed)
Echo report and OV notes from cardiologist in chart.  EKG from 7/13 from [name] office in chart along with last OV notes from [name].

## 2012-05-14 ENCOUNTER — Ambulatory Visit (HOSPITAL_COMMUNITY): Payer: BC Managed Care – PPO | Admitting: Anesthesiology

## 2012-05-14 ENCOUNTER — Ambulatory Visit (HOSPITAL_COMMUNITY): Payer: BC Managed Care – PPO

## 2012-05-14 ENCOUNTER — Inpatient Hospital Stay (HOSPITAL_COMMUNITY)
Admission: RE | Admit: 2012-05-14 | Discharge: 2012-05-17 | DRG: 258 | Disposition: A | Discharge status: Home / Self Care | Payer: BC Managed Care – PPO | Source: Ambulatory Visit | Attending: General Surgery | Admitting: General Surgery

## 2012-05-14 ENCOUNTER — Encounter (HOSPITAL_COMMUNITY)
Admission: RE | Disposition: A | Discharge status: Home / Self Care | Payer: Self-pay | Source: Ambulatory Visit | Attending: General Surgery

## 2012-05-14 ENCOUNTER — Encounter (HOSPITAL_COMMUNITY): Payer: Self-pay | Admitting: *Deleted

## 2012-05-14 ENCOUNTER — Encounter (HOSPITAL_COMMUNITY)
Admission: RE | Admit: 2012-05-14 | Discharge: 2012-05-14 | Disposition: A | Discharge status: Home / Self Care | Payer: BC Managed Care – PPO | Source: Ambulatory Visit | Attending: General Surgery | Admitting: General Surgery

## 2012-05-14 ENCOUNTER — Encounter (HOSPITAL_COMMUNITY): Payer: Self-pay | Admitting: Anesthesiology

## 2012-05-14 DIAGNOSIS — Z9884 Bariatric surgery status: Secondary | ICD-10-CM

## 2012-05-14 DIAGNOSIS — E785 Hyperlipidemia, unspecified: Secondary | ICD-10-CM | POA: Diagnosis present

## 2012-05-14 DIAGNOSIS — M899 Disorder of bone, unspecified: Secondary | ICD-10-CM | POA: Diagnosis present

## 2012-05-14 DIAGNOSIS — K219 Gastro-esophageal reflux disease without esophagitis: Secondary | ICD-10-CM | POA: Diagnosis present

## 2012-05-14 DIAGNOSIS — M949 Disorder of cartilage, unspecified: Secondary | ICD-10-CM | POA: Diagnosis present

## 2012-05-14 DIAGNOSIS — K5900 Constipation, unspecified: Secondary | ICD-10-CM | POA: Diagnosis not present

## 2012-05-14 DIAGNOSIS — Z803 Family history of malignant neoplasm of breast: Secondary | ICD-10-CM

## 2012-05-14 DIAGNOSIS — C50912 Malignant neoplasm of unspecified site of left female breast: Secondary | ICD-10-CM

## 2012-05-14 DIAGNOSIS — D0590 Unspecified type of carcinoma in situ of unspecified breast: Secondary | ICD-10-CM

## 2012-05-14 DIAGNOSIS — C50119 Malignant neoplasm of central portion of unspecified female breast: Principal | ICD-10-CM | POA: Diagnosis present

## 2012-05-14 DIAGNOSIS — E119 Type 2 diabetes mellitus without complications: Secondary | ICD-10-CM | POA: Diagnosis present

## 2012-05-14 DIAGNOSIS — Z87891 Personal history of nicotine dependence: Secondary | ICD-10-CM

## 2012-05-14 DIAGNOSIS — K449 Diaphragmatic hernia without obstruction or gangrene: Secondary | ICD-10-CM | POA: Diagnosis present

## 2012-05-14 DIAGNOSIS — C50911 Malignant neoplasm of unspecified site of right female breast: Secondary | ICD-10-CM

## 2012-05-14 DIAGNOSIS — Z01812 Encounter for preprocedural laboratory examination: Secondary | ICD-10-CM

## 2012-05-14 DIAGNOSIS — Z01818 Encounter for other preprocedural examination: Secondary | ICD-10-CM

## 2012-05-14 HISTORY — PX: AXILLARY SENTINEL NODE BIOPSY: SHX5738

## 2012-05-14 HISTORY — PX: PORTACATH PLACEMENT: SHX2246

## 2012-05-14 HISTORY — PX: TOTAL MASTECTOMY: SHX6129

## 2012-05-14 HISTORY — DX: Malignant neoplasm of unspecified site of right female breast: C50.911

## 2012-05-14 HISTORY — PX: BREAST RECONSTRUCTION WITH PLACEMENT OF TISSUE EXPANDER AND FLEX HD (ACELLULAR HYDRATED DERMIS): SHX6295

## 2012-05-14 LAB — GLUCOSE, CAPILLARY
Glucose-Capillary: 128 mg/dL — ABNORMAL HIGH (ref 70–99)
Glucose-Capillary: 98 mg/dL (ref 70–99)

## 2012-05-14 SURGERY — MASTECTOMY, SIMPLE
Anesthesia: General | Site: Breast | Wound class: Clean

## 2012-05-14 MED ORDER — GLYCOPYRROLATE 0.2 MG/ML IJ SOLN
INTRAMUSCULAR | Status: DC | PRN
  Administered 2012-05-14: 0.6 mg via INTRAVENOUS

## 2012-05-14 MED ORDER — HEPARIN SOD (PORK) LOCK FLUSH 100 UNIT/ML IV SOLN
INTRAVENOUS | Status: DC | PRN
  Administered 2012-05-14: 300 [IU] via INTRAVENOUS

## 2012-05-14 MED ORDER — LIDOCAINE HCL (CARDIAC) 20 MG/ML IV SOLN
INTRAVENOUS | Status: DC | PRN
  Administered 2012-05-14: 60 mg via INTRAVENOUS

## 2012-05-14 MED ORDER — VANCOMYCIN HCL IN DEXTROSE 1-5 GM/200ML-% IV SOLN
1000.0000 mg | Freq: Two times a day (BID) | INTRAVENOUS | Status: DC
  Administered 2012-05-14 – 2012-05-16 (×5): 1000 mg via INTRAVENOUS
  Filled 2012-05-14 (×7): qty 200

## 2012-05-14 MED ORDER — EPHEDRINE SULFATE 50 MG/ML IJ SOLN
INTRAMUSCULAR | Status: DC | PRN
  Administered 2012-05-14: 10 mg via INTRAVENOUS

## 2012-05-14 MED ORDER — METHOCARBAMOL 500 MG PO TABS
500.0000 mg | ORAL_TABLET | Freq: Four times a day (QID) | ORAL | Status: DC | PRN
  Filled 2012-05-14: qty 1

## 2012-05-14 MED ORDER — 0.9 % SODIUM CHLORIDE (POUR BTL) OPTIME
TOPICAL | Status: DC | PRN
  Administered 2012-05-14 (×7): 1000 mL

## 2012-05-14 MED ORDER — ACETAMINOPHEN 10 MG/ML IV SOLN: 1000.0000 mg | Freq: Once | INTRAVENOUS | Status: DC | PRN

## 2012-05-14 MED ORDER — DIPHENHYDRAMINE HCL 12.5 MG/5ML PO ELIX
12.5000 mg | ORAL_SOLUTION | Freq: Four times a day (QID) | ORAL | Status: DC | PRN
  Filled 2012-05-14: qty 5

## 2012-05-14 MED ORDER — ATORVASTATIN CALCIUM 10 MG PO TABS
10.0000 mg | ORAL_TABLET | Freq: Every day | ORAL | Status: DC
  Filled 2012-05-14 (×4): qty 1

## 2012-05-14 MED ORDER — SODIUM CHLORIDE 0.9 % IJ SOLN
INTRAMUSCULAR | Status: DC | PRN
  Administered 2012-05-14: 10:00:00

## 2012-05-14 MED ORDER — PROPOFOL 10 MG/ML IV BOLUS
INTRAVENOUS | Status: DC | PRN
  Administered 2012-05-14 (×3): 20 mg via INTRAVENOUS
  Administered 2012-05-14: 180 mg via INTRAVENOUS

## 2012-05-14 MED ORDER — FENTANYL CITRATE 0.05 MG/ML IJ SOLN
INTRAMUSCULAR | Status: DC | PRN
  Administered 2012-05-14 (×3): 50 ug via INTRAVENOUS
  Administered 2012-05-14: 100 ug via INTRAVENOUS
  Administered 2012-05-14 (×3): 50 ug via INTRAVENOUS
  Administered 2012-05-14: 100 ug via INTRAVENOUS

## 2012-05-14 MED ORDER — ONDANSETRON HCL 4 MG/2ML IJ SOLN: 4.0000 mg | Freq: Four times a day (QID) | INTRAMUSCULAR | Status: DC | PRN

## 2012-05-14 MED ORDER — SODIUM CHLORIDE 0.9 % IR SOLN
Status: DC | PRN
  Administered 2012-05-14: 13:00:00

## 2012-05-14 MED ORDER — ROCURONIUM BROMIDE 100 MG/10ML IV SOLN
INTRAVENOUS | Status: DC | PRN
  Administered 2012-05-14: 50 mg via INTRAVENOUS
  Administered 2012-05-14: 20 mg via INTRAVENOUS
  Administered 2012-05-14: 10 mg via INTRAVENOUS
  Administered 2012-05-14: 30 mg via INTRAVENOUS
  Administered 2012-05-14: 20 mg via INTRAVENOUS
  Administered 2012-05-14: 30 mg via INTRAVENOUS
  Administered 2012-05-14: 10 mg via INTRAVENOUS

## 2012-05-14 MED ORDER — CHLORHEXIDINE GLUCONATE 4 % EX LIQD: 1.0000 "application " | Freq: Once | CUTANEOUS | Status: DC

## 2012-05-14 MED ORDER — METHYLENE BLUE 1 % INJ SOLN
INTRAMUSCULAR | Status: AC
  Filled 2012-05-14: qty 10

## 2012-05-14 MED ORDER — DEXTROSE-NACL 5-0.45 % IV SOLN
INTRAVENOUS | Status: DC
  Administered 2012-05-14 – 2012-05-15 (×2): via INTRAVENOUS

## 2012-05-14 MED ORDER — BUPIVACAINE-EPINEPHRINE 0.25% -1:200000 IJ SOLN
INTRAMUSCULAR | Status: AC
  Filled 2012-05-14: qty 1

## 2012-05-14 MED ORDER — SODIUM CHLORIDE 0.9 % IJ SOLN: 9.0000 mL | INTRAMUSCULAR | Status: DC | PRN

## 2012-05-14 MED ORDER — ONDANSETRON HCL 4 MG/2ML IJ SOLN
INTRAMUSCULAR | Status: DC | PRN
  Administered 2012-05-14: 8 mg via INTRAVENOUS

## 2012-05-14 MED ORDER — HYDROMORPHONE 0.3 MG/ML IV SOLN
INTRAVENOUS | Status: DC
  Administered 2012-05-14: 2.2 mg via INTRAVENOUS
  Administered 2012-05-14: 16:00:00 via INTRAVENOUS
  Administered 2012-05-14: 3.62 mg via INTRAVENOUS
  Administered 2012-05-15: 05:00:00 via INTRAVENOUS
  Administered 2012-05-15: 0.399 mg via INTRAVENOUS
  Filled 2012-05-14: qty 25

## 2012-05-14 MED ORDER — TECHNETIUM TC 99M SULFUR COLLOID FILTERED
1.0000 | Freq: Once | INTRAVENOUS | Status: AC | PRN
  Administered 2012-05-14: 1 via INTRADERMAL

## 2012-05-14 MED ORDER — DOCUSATE SODIUM 100 MG PO CAPS
100.0000 mg | ORAL_CAPSULE | Freq: Every day | ORAL | Status: DC
  Administered 2012-05-14 – 2012-05-17 (×4): 100 mg via ORAL
  Filled 2012-05-14 (×4): qty 1

## 2012-05-14 MED ORDER — BUPIVACAINE-EPINEPHRINE 0.25% -1:200000 IJ SOLN
INTRAMUSCULAR | Status: DC | PRN
  Administered 2012-05-14: 10 mL

## 2012-05-14 MED ORDER — HEPARIN SOD (PORK) LOCK FLUSH 100 UNIT/ML IV SOLN
INTRAVENOUS | Status: AC
  Filled 2012-05-14: qty 5

## 2012-05-14 MED ORDER — ALBUMIN HUMAN 5 % IV SOLN
INTRAVENOUS | Status: DC | PRN
  Administered 2012-05-14: 11:00:00 via INTRAVENOUS

## 2012-05-14 MED ORDER — SODIUM CHLORIDE 0.9 % IR SOLN
Status: DC | PRN
  Administered 2012-05-14: 10:00:00

## 2012-05-14 MED ORDER — ONDANSETRON HCL 4 MG/2ML IJ SOLN: 4.0000 mg | Freq: Once | INTRAMUSCULAR | Status: DC | PRN

## 2012-05-14 MED ORDER — PANTOPRAZOLE SODIUM 40 MG PO TBEC
40.0000 mg | DELAYED_RELEASE_TABLET | Freq: Every day | ORAL | Status: DC
  Administered 2012-05-15 – 2012-05-17 (×3): 40 mg via ORAL
  Filled 2012-05-14: qty 1

## 2012-05-14 MED ORDER — LACTATED RINGERS IV SOLN
INTRAVENOUS | Status: DC | PRN
  Administered 2012-05-14 (×4): via INTRAVENOUS

## 2012-05-14 MED ORDER — HYDROMORPHONE HCL PF 1 MG/ML IJ SOLN
INTRAMUSCULAR | Status: AC
  Filled 2012-05-14: qty 1

## 2012-05-14 MED ORDER — MIDAZOLAM HCL 5 MG/5ML IJ SOLN
INTRAMUSCULAR | Status: DC | PRN
  Administered 2012-05-14 (×2): 1 mg via INTRAVENOUS

## 2012-05-14 MED ORDER — NALOXONE HCL 0.4 MG/ML IJ SOLN: 0.4000 mg | INTRAMUSCULAR | Status: DC | PRN

## 2012-05-14 MED ORDER — SODIUM CHLORIDE 0.9 % IR SOLN
Status: DC | PRN
  Administered 2012-05-14: 1000 mL

## 2012-05-14 MED ORDER — NEOSTIGMINE METHYLSULFATE 1 MG/ML IJ SOLN
INTRAMUSCULAR | Status: DC | PRN
  Administered 2012-05-14: 4 mg via INTRAVENOUS

## 2012-05-14 MED ORDER — HYDROMORPHONE HCL PF 1 MG/ML IJ SOLN
0.2500 mg | INTRAMUSCULAR | Status: DC | PRN
  Administered 2012-05-14 (×4): 0.5 mg via INTRAVENOUS

## 2012-05-14 MED ORDER — HYDROMORPHONE 0.3 MG/ML IV SOLN
INTRAVENOUS | Status: AC
  Administered 2012-05-14: 0.59 mg
  Filled 2012-05-14: qty 25

## 2012-05-14 MED ORDER — SODIUM CHLORIDE 0.9 % IV SOLN
INTRAVENOUS | Status: DC | PRN
  Administered 2012-05-14: 08:00:00 via INTRAVENOUS

## 2012-05-14 MED ORDER — PROMETHAZINE HCL 25 MG/ML IJ SOLN: 6.2500 mg | INTRAMUSCULAR | Status: DC | PRN

## 2012-05-14 MED ORDER — DIPHENHYDRAMINE HCL 50 MG/ML IJ SOLN: 12.5000 mg | Freq: Four times a day (QID) | INTRAMUSCULAR | Status: DC | PRN

## 2012-05-14 SURGICAL SUPPLY — 115 items
ADH SKN CLS APL DERMABOND .7 (GAUZE/BANDAGES/DRESSINGS) ×12
APPLIER CLIP 9.375 MED OPEN (MISCELLANEOUS) ×4
APR CLP MED 9.3 20 MLT OPN (MISCELLANEOUS) ×3
ATCH SMKEVC FLXB CAUT HNDSWH (FILTER) ×3 IMPLANT
BAG DECANTER FOR FLEXI CONT (MISCELLANEOUS) ×8 IMPLANT
BINDER BREAST LRG (GAUZE/BANDAGES/DRESSINGS) IMPLANT
BINDER BREAST XLRG (GAUZE/BANDAGES/DRESSINGS) ×1 IMPLANT
BIOPATCH RED 1 DISK 7.0 (GAUZE/BANDAGES/DRESSINGS) ×10 IMPLANT
BLADE SURG 10 STRL SS (BLADE) ×5 IMPLANT
BLADE SURG 15 STRL LF DISP TIS (BLADE) ×3 IMPLANT
BLADE SURG 15 STRL SS (BLADE) ×8
BLADE SURG ROTATE 9660 (MISCELLANEOUS) IMPLANT
CANISTER SUCTION 2500CC (MISCELLANEOUS) ×8 IMPLANT
CHLORAPREP W/TINT 10.5 ML (MISCELLANEOUS) ×4 IMPLANT
CHLORAPREP W/TINT 26ML (MISCELLANEOUS) ×8 IMPLANT
CLIP APPLIE 9.375 MED OPEN (MISCELLANEOUS) ×3 IMPLANT
CLOTH BEACON ORANGE TIMEOUT ST (SAFETY) ×8 IMPLANT
CONT SPEC 4OZ CLIKSEAL STRL BL (MISCELLANEOUS) ×9 IMPLANT
COVER PROBE W GEL 5X96 (DRAPES) ×4 IMPLANT
COVER SURGICAL LIGHT HANDLE (MISCELLANEOUS) ×8 IMPLANT
CRADLE DONUT ADULT HEAD (MISCELLANEOUS) ×4 IMPLANT
DECANTER SPIKE VIAL GLASS SM (MISCELLANEOUS) ×4 IMPLANT
DERMABOND ADVANCED (GAUZE/BANDAGES/DRESSINGS) ×4
DERMABOND ADVANCED .7 DNX12 (GAUZE/BANDAGES/DRESSINGS) ×6 IMPLANT
DRAIN CHANNEL 19F RND (DRAIN) ×13 IMPLANT
DRAPE C-ARM 42X72 X-RAY (DRAPES) ×4 IMPLANT
DRAPE CHEST BREAST 15X10 FENES (DRAPES) ×4 IMPLANT
DRAPE LAPAROSCOPIC ABDOMINAL (DRAPES) ×4 IMPLANT
DRAPE ORTHO SPLIT 77X108 STRL (DRAPES) ×8
DRAPE PROXIMA HALF (DRAPES) ×12 IMPLANT
DRAPE SURG 17X23 STRL (DRAPES) ×10 IMPLANT
DRAPE SURG ORHT 6 SPLT 77X108 (DRAPES) ×6 IMPLANT
DRAPE UTILITY 15X26 W/TAPE STR (DRAPE) ×8 IMPLANT
DRAPE WARM FLUID 44X44 (DRAPE) ×4 IMPLANT
DRSG PAD ABDOMINAL 8X10 ST (GAUZE/BANDAGES/DRESSINGS) ×8 IMPLANT
DRSG TEGADERM 4X4.75 (GAUZE/BANDAGES/DRESSINGS) ×8 IMPLANT
ELECT BLADE 4.0 EZ CLEAN MEGAD (MISCELLANEOUS) ×4
ELECT BLADE 6.5 EXT (BLADE) ×1 IMPLANT
ELECT CAUTERY BLADE 6.4 (BLADE) ×8 IMPLANT
ELECT REM PT RETURN 9FT ADLT (ELECTROSURGICAL) ×4
ELECTRODE BLDE 4.0 EZ CLN MEGD (MISCELLANEOUS) ×3 IMPLANT
ELECTRODE REM PT RTRN 9FT ADLT (ELECTROSURGICAL) ×6 IMPLANT
EVACUATOR SILICONE 100CC (DRAIN) ×14 IMPLANT
EVACUATOR SMOKE ACCUVAC VALLEY (FILTER) ×1
EXPANDER TISSUE 800CC (Breast) ×2 IMPLANT
GAUZE SPONGE 4X4 16PLY XRAY LF (GAUZE/BANDAGES/DRESSINGS) ×4 IMPLANT
GLOVE BIO SURGEON STRL SZ7.5 (GLOVE) ×4 IMPLANT
GLOVE BIOGEL M STRL SZ7.5 (GLOVE) ×3 IMPLANT
GLOVE BIOGEL PI IND STRL 6.5 (GLOVE) IMPLANT
GLOVE BIOGEL PI IND STRL 7.0 (GLOVE) IMPLANT
GLOVE BIOGEL PI IND STRL 7.5 (GLOVE) IMPLANT
GLOVE BIOGEL PI IND STRL 8 (GLOVE) ×3 IMPLANT
GLOVE BIOGEL PI INDICATOR 6.5 (GLOVE) ×2
GLOVE BIOGEL PI INDICATOR 7.0 (GLOVE) ×4
GLOVE BIOGEL PI INDICATOR 7.5 (GLOVE) ×4
GLOVE BIOGEL PI INDICATOR 8 (GLOVE) ×2
GLOVE ECLIPSE 6.5 STRL STRAW (GLOVE) ×1 IMPLANT
GLOVE ECLIPSE 7.5 STRL STRAW (GLOVE) ×4 IMPLANT
GLOVE ECLIPSE 8.0 STRL XLNG CF (GLOVE) ×1 IMPLANT
GLOVE EUDERMIC 7 POWDERFREE (GLOVE) ×5 IMPLANT
GLOVE SURG SIGNA 7.5 PF LTX (GLOVE) ×1 IMPLANT
GLOVE SURG SS PI 6.5 STRL IVOR (GLOVE) ×1 IMPLANT
GLOVE SURG SS PI 7.0 STRL IVOR (GLOVE) ×2 IMPLANT
GOWN PREVENTION PLUS XLARGE (GOWN DISPOSABLE) ×9 IMPLANT
GOWN STRL NON-REIN LRG LVL3 (GOWN DISPOSABLE) ×17 IMPLANT
GRAFT FLEX HD 4X16 THICK (Tissue Mesh) ×2 IMPLANT
INTRODUCER 13FR (MISCELLANEOUS) IMPLANT
INTRODUCER COOK 11FR (CATHETERS) IMPLANT
KIT BASIN OR (CUSTOM PROCEDURE TRAY) ×8 IMPLANT
KIT DISPOSABLE SPY ELITE (KITS) ×1 IMPLANT
KIT PORT POWER 8FR ISP CVUE (Catheter) ×1 IMPLANT
KIT PORT POWER 9.6FR MRI PREA (Catheter) IMPLANT
KIT PORT POWER ISP 8FR (Catheter) IMPLANT
KIT POWER CATH 8FR (Catheter) ×1 IMPLANT
KIT ROOM TURNOVER OR (KITS) ×8 IMPLANT
MARKER SKIN DUAL TIP RULER LAB (MISCELLANEOUS) ×4 IMPLANT
NDL 18GX1X1/2 (RX/OR ONLY) (NEEDLE) ×3 IMPLANT
NDL 21 GA WING INFUSION (NEEDLE) IMPLANT
NDL HYPO 25GX1X1/2 BEV (NEEDLE) ×6 IMPLANT
NEEDLE 18GX1X1/2 (RX/OR ONLY) (NEEDLE) ×4 IMPLANT
NEEDLE 21 GA WING INFUSION (NEEDLE) ×4 IMPLANT
NEEDLE HYPO 25GX1X1/2 BEV (NEEDLE) ×8 IMPLANT
NS IRRIG 1000ML POUR BTL (IV SOLUTION) ×16 IMPLANT
PACK GENERAL/GYN (CUSTOM PROCEDURE TRAY) ×8 IMPLANT
PACK SURGICAL SETUP 50X90 (CUSTOM PROCEDURE TRAY) ×4 IMPLANT
PAD ARMBOARD 7.5X6 YLW CONV (MISCELLANEOUS) ×8 IMPLANT
PENCIL BUTTON HOLSTER BLD 10FT (ELECTRODE) ×4 IMPLANT
PREFILTER EVAC NS 1 1/3-3/8IN (MISCELLANEOUS) ×4 IMPLANT
SET ASEPTIC TRANSFER (MISCELLANEOUS) ×1 IMPLANT
SET INTRODUCER 12FR PACEMAKER (SHEATH) IMPLANT
SET SHEATH INTRODUCER 10FR (MISCELLANEOUS) IMPLANT
SHEATH COOK PEEL AWAY SET 9F (SHEATH) IMPLANT
SPECIMEN JAR X LARGE (MISCELLANEOUS) ×5 IMPLANT
SPONGE LAP 18X18 X RAY DECT (DISPOSABLE) ×7 IMPLANT
STAPLER VISISTAT 35W (STAPLE) ×4 IMPLANT
SURGILUBE 3G PEEL PACK STRL (MISCELLANEOUS) IMPLANT
SUT ETHILON 3 0 FSL (SUTURE) ×5 IMPLANT
SUT MNCRL AB 3-0 PS2 18 (SUTURE) ×25 IMPLANT
SUT MNCRL AB 4-0 PS2 18 (SUTURE) ×5 IMPLANT
SUT PDS AB 3-0 SH 27 (SUTURE) ×3 IMPLANT
SUT PROLENE 2 0 CT2 30 (SUTURE) ×5 IMPLANT
SUT PROLENE 3 0 PS 2 (SUTURE) ×9 IMPLANT
SUT SILK 2 0 FS (SUTURE) ×4 IMPLANT
SUT VIC AB 3-0 SH 18 (SUTURE) ×8 IMPLANT
SYR 20ML ECCENTRIC (SYRINGE) ×8 IMPLANT
SYR 5ML LUER SLIP (SYRINGE) ×4 IMPLANT
SYR BULB 3OZ (MISCELLANEOUS) ×4 IMPLANT
SYR BULB IRRIGATION 50ML (SYRINGE) ×4 IMPLANT
SYR CONTROL 10ML LL (SYRINGE) ×8 IMPLANT
SYRINGE 10CC LL (SYRINGE) ×4 IMPLANT
TOWEL OR 17X24 6PK STRL BLUE (TOWEL DISPOSABLE) ×7 IMPLANT
TOWEL OR 17X26 10 PK STRL BLUE (TOWEL DISPOSABLE) ×8 IMPLANT
TRAY FOLEY CATH 14FRSI W/METER (CATHETERS) ×1 IMPLANT
TUBE CONNECTING 12X1/4 (SUCTIONS) ×4 IMPLANT
YANKAUER SUCT BULB TIP NO VENT (SUCTIONS) ×1 IMPLANT

## 2012-05-14 NOTE — Preoperative (Signed)
Beta Blockers   Reason not to administer Beta Blockers:Not Applicable 

## 2012-05-14 NOTE — Anesthesia Preprocedure Evaluation (Signed)
Anesthesia Evaluation  Patient identified by MRN, date of birth, ID band Patient awake    Reviewed: Allergy & Precautions, H&P , NPO status , Patient's Chart, lab work & pertinent test results  Airway Mallampati: II      Dental  (+) Teeth Intact and Dental Advisory Given   Pulmonary  breath sounds clear to auscultation        Cardiovascular Rhythm:Regular     Neuro/Psych    GI/Hepatic   Endo/Other    Renal/GU      Musculoskeletal   Abdominal   Peds  Hematology   Anesthesia Other Findings   Reproductive/Obstetrics                           Anesthesia Physical Anesthesia Plan  ASA: III  Anesthesia Plan: General   Post-op Pain Management:    Induction: Intravenous  Airway Management Planned: Oral ETT  Additional Equipment:   Intra-op Plan:   Post-operative Plan: Extubation in OR  Informed Consent: I have reviewed the patients History and Physical, chart, labs and discussed the procedure including the risks, benefits and alternatives for the proposed anesthesia with the patient or authorized representative who has indicated his/her understanding and acceptance.   Dental advisory given  Plan Discussed with: CRNA and Surgeon  Anesthesia Plan Comments:         Anesthesia Quick Evaluation

## 2012-05-14 NOTE — OR Nursing (Signed)
Procedure 1 Port-a-cath Insertion: Start @0812 , End @0850  Procedure 2 Mastectomy and Sentinel Lymph Node Biopsy: Start @0918 , End @1144  Procedure 3 Breast Reconstruction: Start @ 1206, End @1517 

## 2012-05-14 NOTE — Anesthesia Procedure Notes (Signed)
Procedure Name: Intubation Date/Time: 05/14/2012 7:53 AM Performed by: Marni Griffon Pre-anesthesia Checklist: Patient identified, Emergency Drugs available, Suction available and Patient being monitored Patient Re-evaluated:Patient Re-evaluated prior to inductionOxygen Delivery Method: Circle system utilized Preoxygenation: Pre-oxygenation with 100% oxygen Intubation Type: IV induction Ventilation: Mask ventilation without difficulty Laryngoscope Size: Mac and 3 Grade View: Grade II Tube type: Oral Tube size: 7.5 mm Number of attempts: 1 Airway Equipment and Method: Stylet (teeth long, distance to cords a bit long) Placement Confirmation: breath sounds checked- equal and bilateral and positive ETCO2 Secured at: 22 (cm at teeth) cm Tube secured with: Tape Dental Injury: Teeth and Oropharynx as per pre-operative assessment  Comments: Could have used longer blade to start with.Marland KitchenMarland Kitchen

## 2012-05-14 NOTE — Anesthesia Postprocedure Evaluation (Signed)
Anesthesia Post Note  Patient: [name] [name]  Procedure(s) Performed: Procedure(s) (LRB): TOTAL MASTECTOMY (Bilateral) AXILLARY SENTINEL NODE BIOPSY (Left) INSERTION PORT-A-CATH (N/A) BREAST RECONSTRUCTION WITH PLACEMENT OF TISSUE EXPANDER AND FLEX HD (ACELLULAR HYDRATED DERMIS) (Bilateral)  Anesthesia type: General  Patient location: PACU  Post pain: Pain level controlled and Adequate analgesia  Post assessment: Post-op Vital signs reviewed, Patient's Cardiovascular Status Stable, Respiratory Function Stable, Patent Airway and Pain level controlled  Last Vitals:  Filed Vitals:   05/14/12 1630  BP: 151/66  Pulse: 101  Temp:   Resp: 15    Post vital signs: Reviewed and stable  Level of consciousness: awake, alert  and oriented  Complications: No apparent anesthesia complications

## 2012-05-14 NOTE — Transfer of Care (Signed)
Immediate Anesthesia Transfer of Care Note  Patient: [name] [name]  Procedure(s) Performed: Procedure(s): TOTAL MASTECTOMY (Bilateral) AXILLARY SENTINEL NODE BIOPSY (Left) INSERTION PORT-A-CATH (N/A) BREAST RECONSTRUCTION WITH PLACEMENT OF TISSUE EXPANDER AND FLEX HD (ACELLULAR HYDRATED DERMIS) (Bilateral)  Patient Location: PACU  Anesthesia Type:General  Level of Consciousness: awake, alert  and patient cooperative  Airway & Oxygen Therapy: Patient Spontanous Breathing and Patient connected to nasal cannula oxygen  Post-op Assessment: Report given to PACU RN, Post -op Vital signs reviewed and stable and Patient moving all extremities  Post vital signs: Reviewed and stable  Complications: No apparent anesthesia complications

## 2012-05-14 NOTE — Progress Notes (Signed)
ANTIBIOTIC CONSULT NOTE - INITIAL  Pharmacy Consult for vancomycin Indication: surgical prophylaxis  Allergies  Allergen Reactions  . Darvon Hives and Itching    All over body  . Penicillins Hives and Itching    All over body    Patient Measurements: Height: 5\' 4"  (162.6 cm) Weight: 185 lb (83.915 kg) IBW/kg (Calculated) : 54.7  Vital Signs: Temp: 98.5 F (36.9 C) (03/28 1700) Temp src: Oral (03/28 1700) BP: 145/64 mmHg (03/28 1700) Pulse Rate: 89 (03/28 1700) Intake/Output from previous day:   Intake/Output from this shift: Total I/O In: 4050 [I.V.:3800; IV Piggyback:250] Out: 1845 [Urine:1660; Drains:85; Blood:100]  Labs: No results found for this basename: WBC, HGB, PLT, LABCREA, CREATININE,  in the last 72 hours Estimated Creatinine Clearance: 76.4 ml/min (by C-G formula based on Cr of 0.52). No results found for this basename: VANCOTROUGH, VANCOPEAK, VANCORANDOM, GENTTROUGH, GENTPEAK, GENTRANDOM, TOBRATROUGH, TOBRAPEAK, TOBRARND, AMIKACINPEAK, AMIKACINTROU, AMIKACIN,  in the last 72 hours   Medical History: Past Medical History  Diagnosis Date  . Heart murmur   . Diabetes mellitus without complication   . Osteopenia   . Cancer     breast  . Bronchitis   . Pneumonia   . Hiatal hernia   . Sinus problem   . Diabetes mellitus     Medications:  Prescriptions prior to admission  Medication Sig Dispense Refill  . Ascorbic Acid (VITAMIN C) 500 MG tablet Take 500 mg by mouth daily.        . calcitonin, salmon, (MIACALCIN) 200 UNIT/ACT nasal spray Place 1 spray into the nose daily.  3.7 mL  12  . Cholecalciferol (VITAMIN D PO) Take 1 tablet by mouth daily.      Marland Kitchen KRILL OIL PO Take 1 capsule by mouth daily. "mega red" brand      . Multiple Vitamin (MULTIVITAMIN PO) Take 1 tablet by mouth daily.       . pantoprazole (PROTONIX) 40 MG tablet Take 40 mg by mouth daily.        . rosuvastatin (CRESTOR) 5 MG tablet Take 5 mg by mouth every Monday, Wednesday, and Friday.        Scheduled:  . atorvastatin  10 mg Oral q1800  . docusate sodium  100 mg Oral Daily  . HYDROmorphone      . HYDROmorphone      . HYDROmorphone PCA 0.3 mg/mL   Intravenous Q4H  . HYDROmorphone PCA 0.3 mg/mL      . pantoprazole  40 mg Oral Q breakfast  . [COMPLETED] vancomycin  1,000 mg Intravenous On Call to OR  . [DISCONTINUED] chlorhexidine  1 application Topical Once   Assessment: 62 yo female with breast cancer and s/p breast reconstruction to begin vancomycin for surgical prophylaxis. The patient received 1000mg  vancomycin today at 0730. SCr is 0.52 and CrCl ~75.   Goal of Therapy:  Vancomycin trough level 10-15 mcg/ml  Plan:  -Vancomycin 1000mg  IV q12h -Consider defining length of therapy -Will follow renal function and vancomycin levels if needed  Harland German, Pharm D 05/14/2012 6:04 PM

## 2012-05-14 NOTE — Interval H&P Note (Signed)
History and Physical Interval Note:  05/14/2012 7:21 AM  [name] [name]  has presented today for surgery, with the diagnosis of CANCER LEFT BREAST   The goals and the various methods of treatment have been discussed with the patient and family. After consideration of risks, benefits and other options for treatment, the patient has consented to  Procedure(s) with comments: TOTAL MASTECTOMY (Bilateral) AXILLARY SENTINEL NODE BIOPSY (Left) - nuclear medicine injection at 8:00  reconstruction to follow  INSERTION PORT-A-CATH (N/A) - added procedure 04/30/12 carm/ fluoroscopy/ ultrasound  BREAST RECONSTRUCTION WITH PLACEMENT OF TISSUE EXPANDER AND FLEX HD (ACELLULAR HYDRATED DERMIS) (Bilateral) as a surgical intervention .  The patient's history has been reviewed, patient examined, no change in status, stable for surgery.  I have reviewed the patient's chart and labs.  Questions were answered to the patient's satisfaction.     Angelia Mould. Derrell Lolling, M.D., [facility] General and Minimally invasive Surgery Breast and Colorectal Surgery Office:   [phone] Pager:   [phone]

## 2012-05-14 NOTE — Op Note (Addendum)
Patient Name:           [name] [name]   Date of Surgery:        05/14/2012  Pre op Diagnosis:      Multifocal invasive and noninvasive carcinoma, left breast  Post op Diagnosis:    Same  Procedure:                 Insertion of 8 French ClearView power port tunneled venous vascular access device, utilizing  fluoroscopy for guidance and positioning. Inject blue dye left breast Left total mastectomy Left axillary sentinel node biopsy Right total mastectomy  Surgeon:                     Angelia Mould. Derrell Lolling, M.D., FACS  Assistant:                      Avel Peace, M.D.  Operative Indications:   [name] [name] is a 62 y.o. female. She was referred back to me by Rosalie Gums at the breast center of [facility] for evaluation and surgical management of cancer of the left breast.  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 She has recovered from that surgery and is doing well.  She has not had mammograms in 2 years, and recently went for screening mammograms. These showed 2 areas of worrisome calcifications in the left breast, one behind the areola and one in the lower outer quadrant. There was also a non-calcified density possibly in the right breast in the deep inner quadrant not seen on ultrasound. 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 Breast diagnostic Profile reveals receptor positive, HER-2/neu-positive.  Family history reveals breast cancer in a sister who died of metastatic disease in her 38s and breast cancer in the mother who died in her early 34s of other causes. There is no family history of ovarian cancer. The patient has one daughter who is healthy.  Past history is significant for  C-section, but she still has her uterus and ovaries. Roux-en-Y gastric bypass. Perforated marginal ulcer. Diet-controlled diabetes. Hyperlipidemia.  We talked for a very long time with her and her husband. Because of her family history and the multifocal nature of disease in her left breast, she knows that she will need a left mastectomy and so she has stated she would like to have bilateral mastectomies with immediate reconstruction. We talked about these issues at great length. We talked about genetic counseling and testing which should be done and she knows it is important and has decided to do that later. She has seen Dr. Etter Sjogren in consultation, and bilateral implant raced immediate reconstruction is planned. She has a Dr. Drue Second who also plans chemotherapy and requested a Port-A-Cath.   Operative Findings:       The port was placed through the right subclavian vein. Fluoroscopy looked good with the tip of the catheter in the superior vena cava at the right atrial junction and the catheter flushed well and had good blood return. Both breasts were quite large. There was chronic thickening of the tissues suggesting fibrocystic disease. The skin flaps were fairly long because of the large size of her breast. We  found 5 sentinel nodes on the left side and frozen section by Dr. Colonel Bald showed no evidence of cancer, and so we did not do an axillary dissection.  Procedure in Detail:          The patient underwent injection of radionuclide into the left breast subareolar area in the holding area by the nuclear medicine technician. The patient was taken to operating room and general endotracheal anesthesia was induced. A Foley catheter was placed. Intravenous antibiotics were given. Surgical time out was performed. The patient was positioned with a roll behind her shoulders and her arms at her sides. Her neck and chest was prepped and draped in a sterile fashion. 0.5% Marcaine with epinephrine was used as  local infiltration anesthetic. A right subclavian venipuncture was performed and a guidewire inserted into the superior vena cava. Fluoroscopy was used to confirm positioning of the wire. Using the fluoroscope we drew  a template on the chest wall to indicate where the tip of the catheter should be in the superior vena cava- right atrial junction. A small incision was made at the wire insertion site. I made a transverse incision below the medial third of the clavicle. I debrided some subcutaneous tissue. A pocket was created at the level of the pectoralis fascia. Using a tunneling device I drew the catheter from the wire insertion site to the port pocket site. Using the C-arm I determined a template on the chest wall.   I cut the catheter the appropriate length which was about 27 cm. The catheter was then secured to the port with the locking device and flushed with heparinized saline. The port was sutured to the pectoralis fascia with 3 interrupted sutures of 2-0 Prolene. The dilator and peel-away sheath were inserted over the guidewire into the central venous circulation, the dilator and wire were removed, the catheter was threaded through the peel-away sheath the peel-away sheath removed. The catheter flushed easily and had excellent blood return. Fluoroscopy confirmed that the tip of the catheter was in the superior vena cava near the right atrial junction. There was no deformity of the catheter anywhere along its course. The port and catheter were flushed with concentrated heparin. Hemostasis was good. The subcut.  tissues were closed with subcutaneous interrupted sutures of 3-0 Vicryl and skin incisions closed with subcuticular sutures of 4-0 Monocryl and Dermabond.  The patient was then repositioned with her arms out.. The neck and chest and upper abdomen were then prepped and draped in the sterile fashion. Another surgical time out was performed. I injected 5 cc of blue dye into the left breast subareolar  area. This was 2 cc of methylene blue mixed with 3 cc of saline. Massaged the breast for 5 minutes. I then very carefully drew the anatomical boundaries of the breast and the midline. I then drew mirror-image bilateral transverse elliptical incisions, planning out how much skin to remove. I made the right mastectomy incision first. This skin flaps were raised superiorly to the infraclavicular area, taking care not to disturb the port, medially to the parasternal area, inferiorly to the anterior rectus sheath and laterally to the latissimus dorsi muscle. I dissected the breast tissue off of the pectoralis major and minor muscles. I marked the lateral skin margin with a silk suture and sent this to the pathologic lab labeled "prophylactic mastectomy, please evaluate medial breast carefully". This was irrigated with saline. Hemostasis was excellent and achieved with electrocautery and metal clips. The wound was packed with saline laparotomy  pads.  I then made an identical mirror image transverse elliptical incision in the left breast. I raised skin flaps superiorly medially inferiorly and laterally in a similar fashion. I dissected up into the left axilla and using the neoprobe I found 5 sentinel lymph nodes. 2 of these were level I and 3 were in level II. All 5 lymph nodes were examined with frozen section by Dr. Colonel Bald, and he stated that there was no histologic evidence of cancer. We removed the left breast from the pectoralis major and minor muscles, marked the lateral margin with a  silk suture and sent that to the lab as well. Left mastectomy wound was irrigated with saline and hemostasis was likewise excellent. We packed this off.  At this point in time estimated blood loss was 150 cc. Counts were correct. There had been no complications. At this point in time Dr. Etter Sjogren scrubbed in to [place] and control the procedure. I scrubbed out. He will dictate his reconstructive procedure separately.  Chest x-ray is  planned in the postop period.     Angelia Mould. Derrell Lolling, M.D., FACS General and Minimally Invasive Surgery Breast and Colorectal Surgery  05/14/2012 12:01 PM

## 2012-05-14 NOTE — Brief Op Note (Signed)
05/14/2012  3:29 PM  PATIENT:  [name] [name]  62 y.o. female  PRE-OPERATIVE DIAGNOSIS:  CANCER LEFT BREAST   POST-OPERATIVE DIAGNOSIS:  CANCER LEFT BREAST   PROCEDURE:  Procedure(s): TOTAL MASTECTOMY (Bilateral) AXILLARY SENTINEL NODE BIOPSY (Left) INSERTION PORT-A-CATH (N/A) BREAST RECONSTRUCTION WITH PLACEMENT OF TISSUE EXPANDER AND FLEX HD (ACELLULAR HYDRATED DERMIS) (Bilateral)  SURGEON:  Surgeon(s) and Role: Panel 1:    * Ernestene Mention, MD - Primary    * Adolph Pollack, MD - Assisting  Panel 2:    * Etter Sjogren, MD - Primary  PHYSICIAN ASSISTANT:   ASSISTANTS: Jasmine December RNFA   ANESTHESIA:   general  EBL:  Total I/O In: 3450 [I.V.:3200; IV Piggyback:250] Out: 1360 [Urine:1260; Blood:100]  BLOOD ADMINISTERED:none  DRAINS: (4) Jackson-Pratt drain(s) with closed bulb suction in the right chest (2) and left chest (2)   LOCAL MEDICATIONS USED:  NONE  SPECIMEN:  No Specimen  DISPOSITION OF SPECIMEN:  N/A  COUNTS:  YES  TOURNIQUET:  * No tourniquets in log *  DICTATION: .Other Dictation: Dictation Number [id number]  PLAN OF CARE: Admit for overnight observation  PATIENT DISPOSITION:  PACU - hemodynamically stable.   Delay start of Pharmacological VTE agent (>24hrs) due to surgical blood loss or risk of bleeding: yes

## 2012-05-15 MED ORDER — HEPARIN SODIUM (PORCINE) 5000 UNIT/ML IJ SOLN
5000.0000 [IU] | Freq: Three times a day (TID) | INTRAMUSCULAR | Status: DC
  Administered 2012-05-15 – 2012-05-17 (×5): 5000 [IU] via SUBCUTANEOUS
  Filled 2012-05-15 (×10): qty 1

## 2012-05-15 MED ORDER — HYDROMORPHONE HCL 2 MG PO TABS
2.0000 mg | ORAL_TABLET | ORAL | Status: DC | PRN
  Administered 2012-05-15 – 2012-05-17 (×7): 4 mg via ORAL
  Administered 2012-05-17: 2 mg via ORAL
  Filled 2012-05-15 (×2): qty 2
  Filled 2012-05-15: qty 1
  Filled 2012-05-15 (×3): qty 2
  Filled 2012-05-15: qty 1
  Filled 2012-05-15: qty 2
  Filled 2012-05-15: qty 1

## 2012-05-15 MED ORDER — HYDROMORPHONE HCL PF 1 MG/ML IJ SOLN
2.0000 mg | INTRAMUSCULAR | Status: DC | PRN
  Filled 2012-05-15: qty 2

## 2012-05-15 MED ORDER — HYDROMORPHONE HCL PF 1 MG/ML IJ SOLN
1.0000 mg | INTRAMUSCULAR | Status: DC | PRN
  Administered 2012-05-15 – 2012-05-16 (×5): 1 mg via INTRAVENOUS
  Filled 2012-05-15 (×6): qty 1

## 2012-05-15 MED ORDER — METHOCARBAMOL 500 MG PO TABS
500.0000 mg | ORAL_TABLET | Freq: Four times a day (QID) | ORAL | Status: DC
  Administered 2012-05-15 – 2012-05-17 (×6): 500 mg via ORAL
  Filled 2012-05-15 (×10): qty 1

## 2012-05-15 NOTE — Progress Notes (Signed)
1 Day Post-Op   Assessment: s/p Procedure(s): TOTAL MASTECTOMY AXILLARY SENTINEL NODE BIOPSY INSERTION PORT-A-CATH BREAST RECONSTRUCTION WITH PLACEMENT OF TISSUE EXPANDER AND FLEX HD (ACELLULAR HYDRATED DERMIS) Patient Active Problem List  Diagnosis  . Osteopenia  . Hyperlipidemia  . GERD (gastroesophageal reflux disease)  . Diabetes mellitus  . Cancer of left breast  . Cancer of central portion of female breast  . Cancer    Doing well one day post op  Plan: d/c foley Advance diet  Subjective: Feels OK wants foley out, pain controlled, no nausea taking diet OK  Objective: Vital signs in last 24 hours: Temp:  [97.9 F (36.6 C)-99.9 F (37.7 C)] 99.9 F (37.7 C) (03/29 0509) Pulse Rate:  [80-101] 93 (03/29 0509) Resp:  [12-18] 18 (03/29 0509) BP: (109-151)/(45-78) 109/45 mmHg (03/29 0509) SpO2:  [96 %-100 %] 99 % (03/29 0509) Weight:  [185 lb (83.915 kg)] 185 lb (83.915 kg) (03/28 1700)   Intake/Output from previous day: 03/28 0701 - 03/29 0700 In: 5220 [I.V.:4970; IV Piggyback:250] Out: 3330 [Urine:2760; Drains:470; Blood:100]  General appearance: alert, cooperative and no distress Resp: clear to auscultation bilaterally Cardio: regular rate and rhythm, S1, S2 normal, no murmur, click, rub or gallop  Incision: dressings dry, JP's thin  Lab Results:  No results found for this basename: WBC, HGB, HCT, PLT,  in the last 72 hours BMET No results found for this basename: NA, K, CL, CO2, GLUCOSE, BUN, CREATININE, CALCIUM,  in the last 72 hours  MEDS, Scheduled . atorvastatin  10 mg Oral q1800  . docusate sodium  100 mg Oral Daily  . HYDROmorphone PCA 0.3 mg/mL   Intravenous Q4H  . pantoprazole  40 mg Oral Q breakfast  . vancomycin  1,000 mg Intravenous Q12H    Studies/Results: Nm Sentinel Node Inj-no Rpt (breast)  05/14/2012  CLINICAL DATA: cancer left breast   Sulfur colloid was injected intradermally by the nuclear medicine  technologist for breast cancer  sentinel node localization.     Dg Chest Port 1 View  05/14/2012  *RADIOLOGY REPORT*  Clinical Data: Status post Port-A-Cath placement  PORTABLE CHEST - 1 VIEW  Comparison: None.  Findings: The cardiac shadow is within normal limits.  Bilateral tissue expanders as well as surgical drains are identified.  A right chest wall port is seen. The catheter tip is at the cavoatrial junction.  No pneumothorax is noted.  The lungs are clear bilaterally.  IMPRESSION: No evidence of pneumothorax following port placement.  Bilateral tissue expanders.   Original Report Authenticated By: Alcide Clever, M.D.    Dg Fluoro Guide Cv Line-no Report  05/14/2012  CLINICAL DATA: LEFT BREAST CANCER/PORT-A-CATH   FLOURO GUIDE CV LINE  Fluoroscopy was utilized by the requesting physician.  No radiographic  interpretation.        LOS: 1 day     Currie Paris, MD, [facility], [place] [phone]   05/15/2012 9:24 AM

## 2012-05-15 NOTE — Op Note (Signed)
NAME:  Janvier, [place]            ACCOUNT NO.:  [id number]  MEDICAL RECORD NO.:  [id number]  LOCATION:  [facility]                          FACILITY:  [facility]  PHYSICIAN:  Etter Sjogren, M.D.     DATE OF BIRTH:  12-Jul-1950  DATE OF PROCEDURE:  05/14/2012 DATE OF DISCHARGE:                              OPERATIVE REPORT   PREOPERATIVE DIAGNOSIS:  Breast cancer.  POSTOPERATIVE DIAGNOSIS:  Breast cancer.  PROCEDURE PERFORMED: 1. Left breast reconstruction with tissue expander. 2. Left chest wall reconstruction with acellular dermal matrix (Flex     HD over 60 cm2). 3. Right breast reconstruction with tissue expander. 4. Right chest wall reconstruction with acellular dermal matrix (Flex     HD greater than 60 cm2).  SURGEON:  Etter Sjogren, M.D.  ANESTHESIA:  General.  ESTIMATED BLOOD LOSS:  30 mL.  DRAINS:  Two 19-French on each side.  CLINICAL NOTE:  This 62 year old woman has the diagnosis of breast cancer and is having bilateral mastectomy.  She was interested in immediate reconstruction.  Options were discussed, and she selected placement of tissue expanders with a staged procedure of removal of expanders and placement of implants.  She understood the nature of the procedure, risks plus complications, possible use of Flex HD.  These risks include, but not limited to, bleeding, infection, healing problems, scarring, loss of sensation, fluid accumulations, anesthesia complications, asymmetry, contour deformities, contour deformities at the periphery of the reconstruction, loss of skin, loss of tissue, pneumothorax, pulmonary embolism, failure of device, capsular contracture, displacement of device, wrinkles, ripples, chronic pain, and disappointment as well as asymmetry, and she understood all this and wished to proceed.  PROCEDURE IN DETAIL:  The patient was in the operating room and bilateral mastectomy being completed.  The spy machine was then used to assess the vascularity of  the superior and inferior mastectomy flaps on each side.  This was noted to be excellent.  Thorough irrigation with saline and hemostasis with electrocautery.  The pectoralis major muscles were then elevated, and again irrigation with saline antibiotic solution.  Hemostasis was with electrocautery.  Expanders were prepared. These were mentor 800 mL moderate height tissue expanders.  Thoroughly cleaning gloves and the expanders were prepared by placing 100 mL of sterile saline using a closed filling system, removing the air and then soaking them in antibiotic solution.  The expanders were then positioned, and care was taken to make sure that they were oriented properly.  The acellular dermal matrix was soaked in saline for greater than 5 minutes and then soaked in antibiotic solution for greater than 10 minutes.  The Flex HD was positioned and essentially along the lateral border of the pectoralis muscle on each side, and care was taken to make sure that the dermal side was facing towards the overlying mastectomy flaps and the epidermal side down towards the tissue expanders.  The insetting of the Flex HD with 3-0 PDS simple running sutures and a small area was intentionally left and sutured at the superior aspect laterally in order to permit drainage.  Two 19-French drains were positioned, brought out through separate stab wounds inferolaterally and secured with 3-0 Prolene sutures.  One of these was placed  in axilla, the other around the mastectomy defect.  The skin closures with 3-0 Monocryl interrupted inverted deep dermal sutures and the closed filling system was then used to place an additional 250 mL of saline such that the total saline in each tissue expander was 350 mL. The spy Elite machine was then used to assess the vascularity of the inferior and superior mastectomy flaps and was noted to be excellent on both sides.  No evidence of any areas of vascular compromise.   Dermabond was applied to the wounds.  Biopatch with Tegaderm placed for the drains and ABDs and the chest vest position.  She was transferred to recovery room in stable and tolerated the procedure well.     Etter Sjogren, M.D.     DB/MEDQ  D:  05/14/2012  T:  05/15/2012  Job:  [id number]

## 2012-05-15 NOTE — Progress Notes (Signed)
Subjective:  Sore. Good pain control. No nausea. Tolerating fluids well.  Objective: Vital signs in last 24 hours: Temp:  [97.9 F (36.6 C)-99.9 F (37.7 C)] 98.1 F (36.7 C) (03/29 0926) Pulse Rate:  [80-101] 89 (03/29 0926) Resp:  [12-18] 18 (03/29 0926) BP: (109-151)/(45-78) 121/50 mmHg (03/29 0926) SpO2:  [96 %-100 %] 100 % (03/29 0926) Weight:  [185 lb (83.915 kg)] 185 lb (83.915 kg) (03/28 1700)  Intake/Output from previous day: 03/28 0701 - 03/29 0700 In: 5220 [I.V.:4970; IV Piggyback:250] Out: 3330 [Urine:2760; Drains:470; Blood:100] Intake/Output this shift: Total I/O In: 240 [P.O.:240] Out: 50 [Drains:50]  Operative sites: Mastectomy flaps look very good bilateral. No evidence of vascular compromise. Tissue expanders in good position. Drains functioning. Drainage thin. No evidence of bleeding or infection either side.  No results found for this basename: WBC, HGB, HCT, PLATELETS, NA, K, CL, CO2, BUN, CREATININE, GLU,  in the last 72 hours  Studies/Results: Nm Sentinel Node Inj-no Rpt (breast)  05/14/2012  CLINICAL DATA: cancer left breast   Sulfur colloid was injected intradermally by the nuclear medicine  technologist for breast cancer sentinel node localization.     Dg Chest Port 1 View  05/14/2012  *RADIOLOGY REPORT*  Clinical Data: Status post Port-A-Cath placement  PORTABLE CHEST - 1 VIEW  Comparison: None.  Findings: The cardiac shadow is within normal limits.  Bilateral tissue expanders as well as surgical drains are identified.  A right chest wall port is seen. The catheter tip is at the cavoatrial junction.  No pneumothorax is noted.  The lungs are clear bilaterally.  IMPRESSION: No evidence of pneumothorax following port placement.  Bilateral tissue expanders.   Original Report Authenticated By: Alcide Clever, M.D.    Dg Fluoro Guide Cv Line-no Report  05/14/2012  CLINICAL DATA: LEFT BREAST CANCER/PORT-A-CATH   FLOURO GUIDE CV LINE  Fluoroscopy was utilized by  the requesting physician.  No radiographic  interpretation.      Assessment/Plan: D/C PCA pain meds and begin PO Dilaudid. Ambulate in hall. Saline lock IV. Subcu heparin for DVT prophylaxis. Continue Vancomycin for surgical prophylaxis (placement of prosthetic devices and ADM).  LOS: 1 day    Odis Luster, Tenley Winward M 05/15/2012 11:06 AM

## 2012-05-16 NOTE — Progress Notes (Signed)
Subjective: Improving. Still a little trouble getting up, but improving. Pain is under better control todat.   Objective: Vital signs in last 24 hours: Temp:  [97.9 F (36.6 C)-99.6 F (37.6 C)] 98.2 F (36.8 C) (03/30 1012) Pulse Rate:  [74-105] 105 (03/30 1012) Resp:  [16-19] 19 (03/30 1012) BP: (121-131)/(48-56) 121/49 mmHg (03/30 1012) SpO2:  [91 %-100 %] 95 % (03/30 1012)  Intake/Output from previous day: 03/29 0701 - 03/30 0700 In: 720 [P.O.:720] Out: 1546.5 [Urine:1300; Drains:246.5] Intake/Output this shift:    Operative sites: Mastectomy flaps viable. No evidence of vascular compromise. Tissue expanders are in position. Drains functioning. Drainage thin. No sign of bleeding or infection.  No results found for this basename: WBC, HGB, HCT, PLATELETS, NA, K, CL, CO2, BUN, CREATININE, GLU,  in the last 72 hours  Studies/Results: Dg Chest Port 1 View  05/14/2012  *RADIOLOGY REPORT*  Clinical Data: Status post Port-A-Cath placement  PORTABLE CHEST - 1 VIEW  Comparison: None.  Findings: The cardiac shadow is within normal limits.  Bilateral tissue expanders as well as surgical drains are identified.  A right chest wall port is seen. The catheter tip is at the cavoatrial junction.  No pneumothorax is noted.  The lungs are clear bilaterally.  IMPRESSION: No evidence of pneumothorax following port placement.  Bilateral tissue expanders.   Original Report Authenticated By: Alcide Clever, M.D.     Assessment/Plan: Continue Vancomycin one more day. She would like to go home in am.   LOS: 2 days    Odis Luster, Melisssa Donner M 05/16/2012 12:05 PM

## 2012-05-16 NOTE — Progress Notes (Signed)
2 Days Post-Op   Assessment: s/p Procedure(s): TOTAL MASTECTOMY AXILLARY SENTINEL NODE BIOPSY INSERTION PORT-A-CATH BREAST RECONSTRUCTION WITH PLACEMENT OF TISSUE EXPANDER AND FLEX HD (ACELLULAR HYDRATED DERMIS) Patient Active Problem List  Diagnosis  . Osteopenia  . Hyperlipidemia  . GERD (gastroesophageal reflux disease)  . Diabetes mellitus  . Cancer of left breast  . Cancer of central portion of female breast  . Cancer    Slowly improving  Plan: Discharge Per Dr Odis Luster  Subjective: Feels OK, still using IV pain meds  Objective: Vital signs in last 24 hours: Temp:  [97.9 F (36.6 C)-99.6 F (37.6 C)] 99 F (37.2 C) (03/30 0530) Pulse Rate:  [74-98] 98 (03/30 0530) Resp:  [16-18] 18 (03/30 0530) BP: (121-131)/(48-56) 128/48 mmHg (03/30 0530) SpO2:  [91 %-100 %] 91 % (03/30 0530)   Intake/Output from previous day: 03/29 0701 - 03/30 0700 In: 720 [P.O.:720] Out: 1546.5 [Urine:1300; Drains:246.5]  General appearance: alert, cooperative and no distress Resp: clear to auscultation bilaterally  Incision: Dressing left on today - Dr Odis Luster to look at later  Lab Results:  No results found for this basename: WBC, HGB, HCT, PLT,  in the last 72 hours BMET No results found for this basename: NA, K, CL, CO2, GLUCOSE, BUN, CREATININE, CALCIUM,  in the last 72 hours  MEDS, Scheduled . atorvastatin  10 mg Oral q1800  . docusate sodium  100 mg Oral Daily  . heparin subcutaneous  5,000 Units Subcutaneous Q8H  . methocarbamol  500 mg Oral Q6H  . pantoprazole  40 mg Oral Q breakfast  . vancomycin  1,000 mg Intravenous Q12H    Studies/Results: Dg Chest Port 1 View  05/14/2012  *RADIOLOGY REPORT*  Clinical Data: Status post Port-A-Cath placement  PORTABLE CHEST - 1 VIEW  Comparison: None.  Findings: The cardiac shadow is within normal limits.  Bilateral tissue expanders as well as surgical drains are identified.  A right chest wall port is seen. The catheter tip is at  the cavoatrial junction.  No pneumothorax is noted.  The lungs are clear bilaterally.  IMPRESSION: No evidence of pneumothorax following port placement.  Bilateral tissue expanders.   Original Report Authenticated By: Alcide Clever, M.D.    Dg Fluoro Guide Cv Line-no Report  05/14/2012  CLINICAL DATA: LEFT BREAST CANCER/PORT-A-CATH   FLOURO GUIDE CV LINE  Fluoroscopy was utilized by the requesting physician.  No radiographic  interpretation.        LOS: 2 days     Currie Paris, MD, [facility], [place] [phone]   05/16/2012 9:05 AM

## 2012-05-16 NOTE — Progress Notes (Signed)
Breast Cancer Bag given and explained.  Reviewed arm precautions with pt and gave pt pillow for arm support on both sides.  JP drainage chart given and explained.

## 2012-05-16 NOTE — Progress Notes (Signed)
Pt. asleep, after having issues with pain control did not awaken for vital signs Resp 16 even,unlabored

## 2012-05-17 ENCOUNTER — Encounter (HOSPITAL_COMMUNITY): Payer: Self-pay | Admitting: General Surgery

## 2012-05-17 LAB — BASIC METABOLIC PANEL
BUN: 4 mg/dL — ABNORMAL LOW (ref 6–23)
CO2: 33 mEq/L — ABNORMAL HIGH (ref 19–32)
Calcium: 8.7 mg/dL (ref 8.4–10.5)
Chloride: 100 mEq/L (ref 96–112)
Creatinine, Ser: 0.56 mg/dL (ref 0.50–1.10)
GFR calc Af Amer: >90 mL/min (ref >90)
GFR calc non Af Amer: >90 mL/min (ref >90)
Glucose, Bld: 132 mg/dL — ABNORMAL HIGH (ref 70–99)
Potassium: 3.7 mEq/L (ref 3.5–5.1)
Sodium: 139 mEq/L (ref 135–145)

## 2012-05-17 LAB — VANCOMYCIN, TROUGH: Vancomycin Tr: 10.2 ug/mL (ref 10.0–20.0)

## 2012-05-17 MED ORDER — ENOXAPARIN (LOVENOX) PATIENT EDUCATION KIT
PACK | Freq: Once | Status: DC
  Filled 2012-05-17: qty 1

## 2012-05-17 MED ORDER — POLYETHYLENE GLYCOL 3350 17 G PO PACK
17.0000 g | PACK | Freq: Once | ORAL | Status: AC
  Administered 2012-05-17: 17 g via ORAL
  Filled 2012-05-17: qty 1

## 2012-05-17 MED ORDER — HYDROMORPHONE HCL 2 MG PO TABS: 2.0000 mg | ORAL_TABLET | ORAL | Status: DC | PRN

## 2012-05-17 MED ORDER — DSS 100 MG PO CAPS: 100.0000 mg | ORAL_CAPSULE | Freq: Two times a day (BID) | ORAL | Status: DC

## 2012-05-17 MED ORDER — SULFAMETHOXAZOLE-TMP DS 800-160 MG PO TABS: 1.0000 | ORAL_TABLET | Freq: Every day | ORAL | Status: DC

## 2012-05-17 MED ORDER — ENOXAPARIN SODIUM 40 MG/0.4ML ~~LOC~~ SOLN
40.0000 mg | SUBCUTANEOUS | Status: DC
  Filled 2012-05-17: qty 0.4

## 2012-05-17 MED ORDER — ENOXAPARIN SODIUM 40 MG/0.4ML ~~LOC~~ SOLN: 40.0000 mg | SUBCUTANEOUS | Status: DC

## 2012-05-17 MED ORDER — SULFAMETHOXAZOLE-TMP DS 800-160 MG PO TABS
1.0000 | ORAL_TABLET | Freq: Every day | ORAL | Status: DC
  Administered 2012-05-17: 1 via ORAL
  Filled 2012-05-17: qty 1

## 2012-05-17 MED ORDER — METHOCARBAMOL 500 MG PO TABS: 500.0000 mg | ORAL_TABLET | Freq: Four times a day (QID) | ORAL | Status: DC

## 2012-05-17 NOTE — Progress Notes (Signed)
Patient discharged to home with husband.  Discharge teaching completed including follow up care, medications, signs and symptoms of infection, drain care and Lovenox administration.  Verbalizes understanding with no further questions.  Demonstrated care and emptying of JP drain without difficulty. Vital signs stable. Discharged by wheelchair with husband.

## 2012-05-17 NOTE — Progress Notes (Signed)
3 Days Post-Op  Subjective: Stable and alert. Pain controlled. Ambulated in the hall three-time just today. Tolerating diet but constipated. Drainage serosanguineous, slowing down, all 4 drains functioning.  Objective: Vital signs in last 24 hours: Temp:  [98 F (36.7 C)-98.6 F (37 C)] 98.6 F (37 C) (03/30 2139) Pulse Rate:  [91-105] 91 (03/30 2139) Resp:  [15-19] 16 (03/30 2139) BP: (119-122)/(45-55) 119/45 mmHg (03/30 2139) SpO2:  [95 %-96 %] 95 % (03/30 2139) Last BM Date: 05/14/12  Intake/Output from previous day: 03/30 0701 - 03/31 0700 In: -  Out: 165 [Drains:165] Intake/Output this shift: Total I/O In: -  Out: 60 [Drains:60]  General appearance: alert. Stable. In no distress. Mental status normal. Vital signs stable. Breasts:  bilateral mastectomy skin flaps look  healthy. Skin warm. No necrosis. No bleeding. No drainage from incisions. No hematoma noted.  Lab Results:  No results found for this or any previous visit (from the past 24 hour(s)).   Studies/Results: @RISRSLT24 @  . atorvastatin  10 mg Oral q1800  . docusate sodium  100 mg Oral Daily  . heparin subcutaneous  5,000 Units Subcutaneous Q8H  . methocarbamol  500 mg Oral Q6H  . pantoprazole  40 mg Oral Q breakfast  . polyethylene glycol  17 g Oral Once  . vancomycin  1,000 mg Intravenous Q12H     Assessment/Plan: s/p Procedure(s): TOTAL MASTECTOMY AXILLARY SENTINEL NODE BIOPSY INSERTION PORT-A-CATH BREAST RECONSTRUCTION WITH PLACEMENT OF TISSUE EXPANDER AND FLEX HD (ACELLULAR HYDRATED DERMIS)  POD #3. Doing well. Will order one dose of MiraLAX to counteract constipation Anticipate discharge today, per Dr. Odis Luster. She is aware that I will call pathology report to her when available, probably by tomorrow afternoon. See me in 3 weeks. Followup with medical oncology discussed.   @PROBHOSP @  LOS: 3 days    [name] [name] M 05/17/2012  . .prob

## 2012-05-17 NOTE — Discharge Summary (Signed)
Physician Discharge Summary  Patient ID: [name] [name] MRN: [id number] DOB/AGE: [birth year] 62 y.o.  Admit date: 05/14/2012 Discharge date: 05/17/2012  Admission Diagnoses:Breast cancer  Discharge Diagnoses: Same Principal Problem:   Cancer of left breast   Discharged Condition: good  Hospital Course: On the day of admission the patient was taken to surgery and had bilateral mastectomy and sentinel node left and reconstruction with tissue expanders and Flex HD. The patient tolerated the procedures well. Postoperatively, the flap maintained excellent color and capillary refill. The patient was ambulatory and tolerating diet on the first postoperative day. Pain control improved gradually. DVT prophylaxis was begun on post-op day 1. She is ready for discharge.  Treatments: antibiotics: vancomycin, anticoagulation: heparin and surgery: bilateral mastectomy, left sentinel node, placement of portacath and reconstruction with tissue expanders and FlexHD.  Discharge Exam: Blood pressure 113/57, pulse 82, temperature 97.4 F (36.3 C), temperature source Axillary, resp. rate 16, height 5\' 4"  (1.626 m), weight 185 lb (83.915 kg), SpO2 98.00%.  Operative sites: Mastectomy flaps viable. Tissue expanders appear to be well positioned. Drains functioning. Drainage thin. No evidence of bleeding or infection.  Disposition: 01-Home or Self Care   Future Appointments Provider Department Dept Phone   05/18/2012 10:00 AM [facility] ECHO LAB [phone]   05/19/2012 8:30 AM Victorino December, MD [facility] MEDICAL ONCOLOGY [phone]   06/07/2012 11:30 AM [facility] [phone]   06/09/2012 2:00 PM [facility] Nurse [facility] RADIATION ONCOLOGY [phone]   06/09/2012 2:30 PM Lurline Hare, MD  [facility] [phone]       Medication List     STOP taking these medications       calcitonin (salmon) 200 UNIT/ACT nasal spray  Commonly known as:  MIACALCIN     KRILL OIL PO      TAKE these medications       DSS 100 MG Caps  Take 100 mg by mouth 2 (two) times daily.     enoxaparin 40 MG/0.4ML injection  Commonly known as:  LOVENOX  Inject 0.4 mLs (40 mg total) into the skin daily.     HYDROmorphone 2 MG tablet  Commonly known as:  DILAUDID  Take 1-2 tablets (2-4 mg total) by mouth every 4 (four) hours as needed for pain.     methocarbamol 500 MG tablet  Commonly known as:  ROBAXIN  Take 1 tablet (500 mg total) by mouth every 6 (six) hours.     MULTIVITAMIN PO  Take 1 tablet by mouth daily.     pantoprazole 40 MG tablet  Commonly known as:  PROTONIX  Take 40 mg by mouth daily.     rosuvastatin 5 MG tablet  Commonly known as:  CRESTOR  Take 5 mg by mouth every Monday, Wednesday, and Friday.     sulfamethoxazole-trimethoprim 800-160 MG per tablet  Commonly known as:  BACTRIM DS  Take 1 tablet by mouth daily.     vitamin C 500 MG tablet  Commonly known as:  ASCORBIC ACID  Take 500 mg by mouth daily.     VITAMIN D PO  Take 1 tablet by mouth daily.           Follow-up Information   Follow up with Ernestene Mention, MD. Schedule an appointment as soon as possible for a visit in 3 weeks.   Contact information:   [address]  [place] [address] [phone]       Signed: Rossie Muskrat 05/17/2012, 7:36 AM

## 2012-05-18 ENCOUNTER — Encounter (INDEPENDENT_AMBULATORY_CARE_PROVIDER_SITE_OTHER): Payer: Self-pay

## 2012-05-18 ENCOUNTER — Ambulatory Visit (HOSPITAL_COMMUNITY): Payer: BC Managed Care – PPO | Attending: Internal Medicine

## 2012-05-18 ENCOUNTER — Telehealth (INDEPENDENT_AMBULATORY_CARE_PROVIDER_SITE_OTHER): Payer: Self-pay | Admitting: General Surgery

## 2012-05-18 NOTE — Telephone Encounter (Signed)
I called [name] and discussed her pathology report with her. I told her that she had bilateral cancer, but nodes are negative. She stated she appreciated the information and that she was doing well from the surgery and feeling better. Followup appointments with me and with Dr. Odis Luster are arranged.   Angelia Mould. Derrell Lolling, M.D., [facility] General and Minimally invasive Surgery Breast and Colorectal Surgery Office:   [phone] Pager:   [phone]

## 2012-05-19 ENCOUNTER — Telehealth (INDEPENDENT_AMBULATORY_CARE_PROVIDER_SITE_OTHER): Payer: Self-pay

## 2012-05-19 ENCOUNTER — Telehealth: Payer: Self-pay | Admitting: *Deleted

## 2012-05-19 ENCOUNTER — Ambulatory Visit: Payer: BC Managed Care – PPO | Admitting: Oncology

## 2012-05-19 NOTE — Telephone Encounter (Signed)
Message copied by Ivory Broad on Wed May 19, 2012  3:08 PM ------      Message from: Grace Isaac      Created: Wed May 19, 2012  8:56 AM      Regarding: Dr. Derrell Lolling      Contact: [phone]       Pt needs a po apt. Her sx was on 3-28. She would prefer an early afternoon appointment.       Thanks! ------

## 2012-05-19 NOTE — Telephone Encounter (Signed)
Pt called stating that she is sick and cannot make that appt today.  Rescheduled and confirmed 06/07/12 appt /w pt.

## 2012-05-19 NOTE — Telephone Encounter (Signed)
I called and gave the pt her appointment for follow up

## 2012-05-27 ENCOUNTER — Encounter: Payer: Self-pay | Admitting: Oncology

## 2012-05-28 ENCOUNTER — Institutional Professional Consult (permissible substitution): Payer: BC Managed Care – PPO | Admitting: Radiation Oncology

## 2012-06-02 ENCOUNTER — Telehealth (HOSPITAL_COMMUNITY): Payer: Self-pay | Admitting: Cardiology

## 2012-06-02 NOTE — Telephone Encounter (Signed)
OPENED IN ERROR

## 2012-06-07 ENCOUNTER — Ambulatory Visit (HOSPITAL_BASED_OUTPATIENT_CLINIC_OR_DEPARTMENT_OTHER): Payer: BC Managed Care – PPO | Admitting: Oncology

## 2012-06-07 ENCOUNTER — Encounter: Payer: Self-pay | Admitting: Oncology

## 2012-06-07 ENCOUNTER — Ambulatory Visit (HOSPITAL_COMMUNITY): Payer: BC Managed Care – PPO

## 2012-06-07 VITALS — BP 150/84 | HR 90 | Temp 98.1°F | Resp 20 | Ht 64.0 in | Wt 182.9 lb

## 2012-06-07 DIAGNOSIS — D0590 Unspecified type of carcinoma in situ of unspecified breast: Secondary | ICD-10-CM

## 2012-06-07 DIAGNOSIS — Z170 Estrogen receptor positive status [ER+]: Secondary | ICD-10-CM

## 2012-06-07 DIAGNOSIS — C50119 Malignant neoplasm of central portion of unspecified female breast: Secondary | ICD-10-CM

## 2012-06-07 DIAGNOSIS — C50912 Malignant neoplasm of unspecified site of left female breast: Secondary | ICD-10-CM

## 2012-06-07 MED ORDER — UNABLE TO FIND: 1.0000 | Freq: Once | Status: DC

## 2012-06-07 NOTE — Progress Notes (Signed)
OFFICE PROGRESS NOTE  CC  [name], MD [address] [place] [address] Dr. 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 Patient is status post bilateral mastectomy with immediate reconstruction  STAGE:  Right breast (mastectomy)  T1cN0 1.2 cm ER-/PR-/her-/ki-67 92% 0/1 nodes positive  Left breast (mastectomy with snl) T1cN0 1.4 cm, 1.2 cm, 0.1c ER 100%, PR 11%, her2neu positive 6.50, Ki-67 80%   PRIOR THERAPY:  #1screening mammogram performed that showed 2 areas of concern on the left side.calcifications were noted in the left. In the right breast a possible mass warranted further evaluation.   #2 On 03/29/2012 patient underwent a bilateral diagnostic mammogram and right breast ultrasound. A spot magnification images demonstrate suspicious group of pleomorphic microcalcifications over the outer lower left breast with additional suspicious group of pleomorphic microcalcifications over the outer midportion of the left periareolar region. Spot compression images of the right breast demonstrate persistence of a 1 cm density at the edge of the film in the deep third of the right inner breast.   #3Ultrasound performed showed no focal abnormality over the entire in her right breast to correspond to the mammographic density. Patient was recommended stereotactic core needle biopsy of the 2 groups of suspicious left breast microcalcifications. Because patient and her husband wanted bilateral mastectomies MRI was not performed.on 04/15/2012 patient had needle core biopsy performed of the 2 areas in the left breast. The left needle core biopsy in the lower breast revealed ductal carcinoma in situ with associated comedo necrosis and calcifications with the in situ carcinoma. It was ER +100% PR +12%. 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 Tumor was ER +100% PR +11% proliferation marker Ki-67 elevated at 80% HER-2/neu showed amplification with a ratio of 6.50.   #4 patient is now status post bilateral mastectomies performed on 05/14/2012 with immediate reconstruction. The final pathology revealed bilateral breast cancers. In the right breast tissue is noted to have a 1.2 cm invasive ductal carcinoma high-grade ER negative PR negative HER-2/neu negative with Ki-67 92% one node was negative for metastatic disease but this was not the sentinel lymph node. On the left side the patient had multifocal disease measuring 1.4 cm, 1.2 cm, 0.1 cm. Tumor was ER +100% PR +11% HER-2/neu positive with a ratio 6.50. Ki-67 was 80% 8 sentinel nodes were negative for metastatic disease.  #5 patient has had immediate reconstruction with expanders bilaterally.   CURRENT THERAPY:being evaluated for adjuvant chemotherapy  INTERVAL HISTORY: [name] [name] 62 y.o. female returns for followup visit after her mastectomies bilaterally. She was incidentally found to have contralateral disease on the right side which is triple negative. Postoperatively she seems to be doing well but does have significant pain. She is accompanied by her husband. She remains very pleasant. She denies any fevers chills night sweats headaches shortness of breath chest pains palpitations she just has fatigue. They both obviously are very surprised about having been diagnosed with triple-negative disease on the right side.we discussed her pathology in detail. We also discussed the rationale for adjuvant chemotherapy.  MEDICAL HISTORY: Past Medical History  Diagnosis Date  . Heart murmur   . Diabetes mellitus without complication   . Osteopenia   . Cancer     breast  . Bronchitis   .  Pneumonia   . Hiatal hernia   . Sinus problem   . Diabetes mellitus     ALLERGIES:  is  allergic to darvon and penicillins.  MEDICATIONS:  Current Outpatient Prescriptions  Medication Sig Dispense Refill  . Ascorbic Acid (VITAMIN C) 500 MG tablet Take 500 mg by mouth daily.        . Biotin 10 MG TABS Take by mouth. 4000 mcg daily      . Cholecalciferol (VITAMIN D PO) Take 1 tablet by mouth daily.      Marland Kitchen docusate sodium 100 MG CAPS Take 100 mg by mouth 2 (two) times daily.  30 capsule  1  . Multiple Vitamin (MULTIVITAMIN PO) Take 1 tablet by mouth daily.       . pantoprazole (PROTONIX) 40 MG tablet Take 40 mg by mouth daily.        . Probiotic Product (PROBIOTIC DAILY PO) Take by mouth daily.      . rosuvastatin (CRESTOR) 5 MG tablet Take 5 mg by mouth every Monday, Wednesday, and Friday.      Marland Kitchen HYDROmorphone (DILAUDID) 2 MG tablet Take 1-2 tablets (2-4 mg total) by mouth every 4 (four) hours as needed for pain.  50 tablet  0   No current facility-administered medications for this visit.    SURGICAL HISTORY:  Past Surgical History  Procedure Laterality Date  . Cesarean section  1981, 1983  . Tummy tuck  1999  . Tonsillectomy    . Foot surgery    . Gastric bypass    . Wound debridement  01/14/2011    Procedure: DEBRIDEMENT ABDOMINAL WOUND;  Surgeon: Ernestene Mention, MD;  Location: [facility];  Service: General;  Laterality: N/A;  wound debridement and debridement on the abdomen  . Cosmetic surgery    . Total mastectomy Bilateral 05/14/2012    Procedure: TOTAL MASTECTOMY;  Surgeon: Ernestene Mention, MD;  Location: [facility] OR;  Service: General;  Laterality: Bilateral;  . Axillary sentinel node biopsy Left 05/14/2012    Procedure: AXILLARY SENTINEL NODE BIOPSY;  Surgeon: Ernestene Mention, MD;  Location: [facility] OR;  Service: General;  Laterality: Left;  . Portacath placement N/A 05/14/2012    Procedure: INSERTION PORT-A-CATH;  Surgeon: Ernestene Mention, MD;  Location: [facility] OR;  Service: General;  Laterality: N/A;  . Breast reconstruction with placement of tissue expander  and flex hd (acellular hydrated dermis) Bilateral 05/14/2012    Procedure: BREAST RECONSTRUCTION WITH PLACEMENT OF TISSUE EXPANDER AND FLEX HD (ACELLULAR HYDRATED DERMIS);  Surgeon: Etter Sjogren, MD;  Location: [facility] OR;  Service: Plastics;  Laterality: Bilateral;    REVIEW OF SYSTEMS:  Pertinent items are noted in HPI.   HEALTH MAINTENANCE:   PHYSICAL EXAMINATION: Blood pressure 150/84, pulse 90, temperature 98.1 F (36.7 C), temperature source Oral, resp. rate 20, height 5\' 4"  (1.626 m), weight 182 lb 14.4 oz (82.963 kg). Body mass index is 31.38 kg/(m^2). ECOG PERFORMANCE STATUS: 0 - Asymptomatic Well-developed well-nourished very pleasant female in no acute distress HEENT exam EOMI PERRLA sclerae anicteric no conjunctival pallor oral mucosa is moist neck is supple lungs are clear cardiovascular regular rate rhythm abdomen soft nontender no HSM extremities no edema neuro is nonfocal Bilateral expanders in place mild tenderness     LABORATORY DATA: Lab Results  Component Value Date   WBC 5.5 05/10/2012   HGB 14.5 05/10/2012   HCT 42.3 05/10/2012   MCV 87.4 05/10/2012   PLT 296 05/10/2012  Chemistry      Component Value Date/Time   NA 139 05/17/2012 0530   K 3.7 05/17/2012 0530   CL 100 05/17/2012 0530   CO2 33* 05/17/2012 0530   BUN 4* 05/17/2012 0530   CREATININE 0.56 05/17/2012 0530      Component Value Date/Time   CALCIUM 8.7 05/17/2012 0530   ALKPHOS 103 05/10/2012 1440   AST 25 05/10/2012 1440   ALT 17 05/10/2012 1440   BILITOT 0.4 05/10/2012 1440    ADDITIONAL INFORMATION: 6. PROGNOSTIC INDICATORS - ACIS Results: IMMUNOHISTOCHEMICAL AND MORPHOMETRIC ANALYSIS BY THE AUTOMATED CELLULAR IMAGING SYSTEM (ACIS) Estrogen Receptor: 0%, NEGATIVE Progesterone Receptor: 0%, NEGATIVE Proliferation Marker Ki67: 92% COMMENT: The negative hormone receptor study(ies) in this case have an internal positive control. REFERENCE RANGE ESTROGEN RECEPTOR NEGATIVE <1% POSITIVE  =>1% PROGESTERONE RECEPTOR NEGATIVE <1% POSITIVE =>1% All controls stained appropriately Pecola Leisure MD Pathologist, Electronic Signature ( Signed 05/21/2012) 6. CHROMOGENIC IN-SITU HYBRIDIZATION Results: HER-2/NEU BY CISH - NO AMPLIFICATION OF HER-2 DETECTED. 1 of 6 Duplicate copy FINAL for Mirante, [name] G [phone]) ADDITIONAL INFORMATION:(continued) RESULT RATIO OF HER2: CEP 17 SIGNALS 1.19 AVERAGE HER2 COPY NUMBER PER CELL 1.85 REFERENCE RANGE NEGATIVE HER2/Chr17 Ratio <2.0 and Average HER2 copy number <4.0 EQUIVOCAL HER2/Chr17 Ratio <2.0 and Average HER2 copy number 4.0 and <6.0 POSITIVE HER2/Chr17 Ratio >=2.0 and/or Average HER2 copy number >=6.0 Pecola Leisure MD Pathologist, Electronic Signature ( Signed 05/20/2012) FINAL DIAGNOSIS Diagnosis 1. Lymph node, sentinel, biopsy, Left axillary - THERE IS NO EVIDENCE OF CARCINOMA IN 1 OF 1 LYMPH NODE (0/1). 2. Lymph node, sentinel, biopsy, Left axillary - THERE IS NO EVIDENCE OF CARCINOMA IN 1 OF 1 LYMPH NODE (0/1). 3. Lymph node, sentinel, biopsy, Left axillary - THERE IS NO EVIDENCE OF CARCINOMA IN 1 OF 1 LYMPH NODE (0/1). 4. Lymph node, sentinel, biopsy, Left axillary - THERE IS NO EVIDENCE OF CARCINOMA IN 1 OF 1 LYMPH NODE (0/1). 5. Lymph node, sentinel, biopsy, Left axillary - THERE IS NO EVIDENCE OF CARCINOMA IN 1 OF 1 LYMPH NODE (0/1). 6. Breast, simple mastectomy, Right - INVASIVE DUCTAL CARCINOMA, GRADE III/III, SPANNING 1.2 CM. - DUCTAL CARCINOMA IN SITU, HIGH GRADE. - LYMPHOVASCULAR INVASION IS IDENTIFIED. - THERE IS NO EVIDENCE OF CARCINOMA IN 1 OF 1 LYMPH NODE (0/1). - SEE ONCOLOGY TABLE BELOW. 7. Breast, simple mastectomy, Left - INVASIVE DUCTAL CARCINOMA, AT LEAST THREE FOCI, GRADE II/III, SPANNING 1.4 CM, 1.2 CM, AND 0.1 CM. - DUCTAL CARCINOMA IN SITU, INTERMEDIATE GRADE. - LYMPHOVASCULAR INVASION IS IDENTIFIED. - DUCTAL CARCINOMA IN SITU IS FOCALLY 0.2 CM TO THE ANTERIOR/INFERIOR SOFT TISSUE  RESECTION MARGIN. - LOBULAR NEOPLASIA (LOBULAR CARCINOMA IN SITU). - THERE IS NO EVIDENCE OF CARCINOMA IN 2 OF 2 LYMPH NODES (0/2). - SEE ONCOLOGY TABLE BELOW. 8. Lymph node, biopsy, Left axillary - THERE IS NO EVIDENCE OF CARCINOMA IN 1 OF 1 LYMPH NODE (0/1). Microscopic Comment 6. BREAST, INVASIVE TUMOR, WITH LYMPH NODE SAMPLING Specimen, including laterality: Right breast Procedure: Simple mastectomy Grade: 3 2 of 6 Duplicate copy FINAL for Mennenga, [name] G ([id number]) Microscopic Comment(continued) Tubule formation: 3 Nuclear pleomorphism: 3 Mitotic:2 Tumor size (gross measurement): 1.2 cm Margins: Invasive, distance to closest margin: 1.3 cm to the deep margin (gross measurement) In-situ, distance to closest margin: 1.3 cm to the deep margin (gross measurement) Lymphovascular invasion: Present Ductal carcinoma in situ: Present Grade: High grade Extensive intraductal component: Not identified Lobular neoplasia: Not identified in specimen #6 Tumor focality: Unifocal Treatment effect: N/A Extent of tumor:  Confined to breast parenchyma Lymph nodes: # examined: 1 Lymph nodes with metastasis: 0 Breast prognostic profile: Will be performed on the current case and the results reported separately. TNM: pT1c, pN0 7. BREAST, INVASIVE TUMOR, WITH LYMPH NODE SAMPLING Specimen, including laterality: Left breast Procedure: Simple mastectomy Grade: 2 Tubule formation: 3 Nuclear pleomorphism: 2 Mitotic:2 Tumor size (gross measurements) 1.4 cm, 1.2 cm, and 0.1 cm Margins: Invasive, distance to closest margin: 0.7 cm to the anterior/inferior soft tissue resection margin In-situ, distance to closest margin: Focally 0.2 cm to the anterior/inferior soft tissue resection margin (glass slide measurement) Lymphovascular invasion: Present Ductal carcinoma in situ: Present Grade: Intermediate grade Extensive intraductal component: Yes 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 The ratio was 6.50. 3 of 6 Duplicate copy FINAL for Buss, [name] G ([id number]) Microscopic Comment(continued) Ki-67: 80% TNM: mpT1c, pN0 Comments: In addition to the two grossly identified nodules, there is invasive carcinoma present in random tissue submitted from the inferior lateral quadrant. The three foci of carcinoma are morphologically similar.   RADIOGRAPHIC STUDIES:  Nm Pet Image Initial (pi) Skull Base To Thigh  05/12/2012  *RADIOLOGY REPORT*  Clinical Data: Initial treatment strategy for breast cancer.   NUCLEAR MEDICINE PET SKULL BASE TO THIGH  Fasting Blood Glucose:  143  Technique:  20 mCi F-18 FDG was injected intravenously. CT data was obtained and used for attenuation correction and anatomic localization only.  (This was not acquired as a diagnostic CT examination.) Additional exam technical data entered on technologist worksheet.  Comparison:  none  Findings:  Neck: No hypermetabolic lymph nodes in the neck.  Chest:  In the inferior medial aspect of the right breast there is a 10 mm nodule (image 99) with moderate  metabolic activity ( SUV max = 2.9).  No hypermetabolic mediastinal or hilar lymph nodes.  There is hypermetabolic activity in the thoracic inlet which is felt to be vascular in nature.  There is activity adjacent to the thoracic aorta which is also felt to be vascular may relate to the hemiazygos vein.  Abdomen/Pelvis:  No abnormal hypermetabolic activity within the liver, pancreas, adrenal glands, or spleen.  No hypermetabolic lymph nodes in the abdomen or pelvis.  Skeleton:  No focal hypermetabolic activity to  suggest skeletal metastasis.  IMPRESSION:  1.  No evidence of distant breast cancer metastasis. 2.  Hypermetabolic nodule within the inferior medial right breast   Original Report Authenticated By: Genevive Bi, M.D.    Nm Sentinel Node Inj-no Rpt (breast)  05/14/2012  CLINICAL DATA: cancer left breast   Sulfur colloid was injected intradermally by the nuclear medicine  technologist for breast cancer sentinel node localization.     Dg Chest Port 1 View  05/14/2012  *RADIOLOGY REPORT*  Clinical Data: Status post Port-A-Cath placement  PORTABLE CHEST - 1 VIEW  Comparison: None.  Findings: The cardiac shadow is within normal limits.  Bilateral tissue expanders as well as surgical drains are identified.  A right chest wall port is seen. The catheter tip is at the cavoatrial junction.  No pneumothorax is noted.  The lungs are clear bilaterally.  IMPRESSION: No evidence of pneumothorax following port placement.  Bilateral tissue expanders.   Original Report Authenticated By: Loraine Leriche  Karle Starch, M.D.    Dg Fluoro Guide Cv Line-no Report  05/14/2012  CLINICAL DATA: LEFT BREAST CANCER/PORT-A-CATH   FLOURO GUIDE CV LINE  Fluoroscopy was utilized by the requesting physician.  No radiographic  interpretation.      ASSESSMENT: 62 year old female with  #1 bilateral breast cancers on the right side she has triple-negative disease stage I left side stage I ER positive PR positive HER-2/neu positive disease. Patient is status post bilateral mastectomies. Of note on the right side the lymph node removed was not a sentinel lymph node. It is unclear what to do with this side in terms of whether or not to do further sentinel lymph node. Clinically patient seems to be doing well.  #2 we discussed her pathology in detail. We discussed chemotherapy to be given adjuvantly. We discussed Adriamycin Cytoxan given every 2 weeks for a total of 4 weeks followed by Taxol and Herceptin given weekly x12 weeks followed by Herceptin every  3 weeks to finish out a total of one year of therapy. We also discussed role of adjuvant hormonal therapy since the left side is ER positive.   PLAN:   #1 patient and her husband are certainly open to getting chemotherapy as they were previously. However they would like to think about it further discuss it with her family members including their children. They will get back to me regarding when they would like to start the chemotherapy.  #2 we will need a Port-A-Cath placed.  #3 she will have chemotherapy teaching class performed.    #4 echocardiogram will need to be repeated at some point.   All questions were answered. The patient knows to call the clinic with any problems, questions or concerns. We can certainly see the patient much sooner if necessary.  I spent 30 minutes counseling the patient face to face. The total time spent in the appointment was 30 minutes.    Drue Second, MD Medical/Oncology [facility] [phone] (beeper) [phone] (Office)  06/07/2012, 3:22 PM

## 2012-06-08 ENCOUNTER — Encounter: Payer: Self-pay | Admitting: *Deleted

## 2012-06-08 NOTE — Progress Notes (Unsigned)
Location of Breast Cancer: left lower, subareolar left, right  Histology per Pathology Report: left: DCIS, invasive ductal, 2/2 nodes neg,  right : invasive ductal, DCIS, 1/1 node neg  Receptor Status: ER(+), PR (+), Her2-neu (+)  Did patient present with symptoms (if so, please note symptoms) or was this found on screening mammography?: screening after 2 yrs  Past/Anticipated interventions by surgeon, if any: left simple mastectomy, axillary node dissection, right simple mastectomy 05/14/12  Past/Anticipated interventions by medical oncology, if any: adjuvant chemotherapy planned, 6 cycles TC, 1 yr Herceptin  Lymphedema issues, if any:  None at this time  Pain issues, if any:    SAFETY ISSUES: Prior radiation? no  Pacemaker/ICD? no  Possible current pregnancy? no  Is the patient on methotrexate? 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

## 2012-06-09 ENCOUNTER — Ambulatory Visit: Payer: BC Managed Care – PPO | Attending: Radiation Oncology

## 2012-06-09 ENCOUNTER — Telehealth: Payer: Self-pay | Admitting: *Deleted

## 2012-06-09 ENCOUNTER — Ambulatory Visit: Payer: BC Managed Care – PPO | Admitting: Radiation Oncology

## 2012-06-09 NOTE — Telephone Encounter (Signed)
Called patient to ask about missing today's appt., lvm for a return call

## 2012-06-10 ENCOUNTER — Encounter (INDEPENDENT_AMBULATORY_CARE_PROVIDER_SITE_OTHER): Payer: Self-pay | Admitting: General Surgery

## 2012-06-10 ENCOUNTER — Ambulatory Visit (INDEPENDENT_AMBULATORY_CARE_PROVIDER_SITE_OTHER): Payer: BC Managed Care – PPO | Admitting: General Surgery

## 2012-06-10 VITALS — BP 138/82 | HR 72 | Temp 97.0°F | Resp 18 | Ht 64.0 in | Wt 182.5 lb

## 2012-06-10 DIAGNOSIS — C50919 Malignant neoplasm of unspecified site of unspecified female breast: Secondary | ICD-10-CM

## 2012-06-10 DIAGNOSIS — C50912 Malignant neoplasm of unspecified site of left female breast: Secondary | ICD-10-CM

## 2012-06-10 DIAGNOSIS — C50911 Malignant neoplasm of unspecified site of right female breast: Secondary | ICD-10-CM

## 2012-06-10 NOTE — Patient Instructions (Signed)
You are recovering from your bilateral mastectomy and reconstruction without any obvious surgical complication.  Continue the stretching exercises and get your range of motion completely back in both shoulders.  I agree with the chemotherapy recommendations that Dr. Welton Flakes has made.  Return to see Dr. Derrell Lolling in September.

## 2012-06-10 NOTE — Progress Notes (Signed)
Patient ID: [name] [name], female   DOB: 1950-09-03, 62 y.o.   MRN: [id number] History: This patient underwent surgery for what turned out to be bilateral breast cancer on 05/14/2012. Port-A-Cath was inserted, left total mastectomy and sentinel node biopsy performed. Right total mastectomy performed. Pathology on the left showed multifocal cancers, stage TI C., N0, grade 2, receptor positive. A cancer was found in the right breast, T1 C., N0, triple negative breast cancer, grade 3. There was one non-sentinel node found. We have discussed extensively whether she needs a right axillary lymph node dissection. Dr. Welton Flakes has decided that she does not need that from her point of view and I agree. Dr. Welton Flakes does not think that she needs radiation therapy and the appointment with Dr. Michell Heinrich was canceled. Surgically she is doing well. All of her drains are out. Dr. Odis Luster has begun expansion of her tissue expanders. He has allowed her to do range of motion exercises of her shoulders. She is comfortable. Not taking the pain medication. She just started driving her car.  Exam: Patient looks good. Her husband is with her. Mastectomy incisions, tissue expanders, Port-A-Cath site, and axilla feel normal. Range of motion left shoulder is almost 90% of normal. Range of motion right shoulder is about 80% of normal. There is no arm swelling or numbness.  Assessment: Left breast cancer, multifocal, receptor positive, T1 C. N0. Right breast cancer, triple negative, T1 C. N0. Recovering uneventfully following above-described bilateral mastectomy with reconstruction  Plan: Proceed with chemotherapy as outlined by Dr. Welton Flakes Continue with tissue expansion as outlined by Dr. Odis Luster Return to see me in September for surveillance.   [name] [name]. Derrell Lolling, M.D., [facility] General and Minimally invasive Surgery Breast and Colorectal Surgery Office:   [phone] Pager:   [phone]

## 2012-06-11 ENCOUNTER — Encounter (INDEPENDENT_AMBULATORY_CARE_PROVIDER_SITE_OTHER): Payer: BC Managed Care – PPO | Admitting: General Surgery

## 2012-06-15 ENCOUNTER — Encounter: Payer: Self-pay | Admitting: *Deleted

## 2012-06-15 NOTE — Progress Notes (Signed)
Mailed after appt letter to pt. 

## 2012-06-18 ENCOUNTER — Encounter: Payer: BC Managed Care – PPO | Admitting: Oncology

## 2012-06-18 ENCOUNTER — Telehealth: Payer: Self-pay | Admitting: Oncology

## 2012-06-21 ENCOUNTER — Telehealth: Payer: Self-pay | Admitting: Dietician

## 2012-06-27 NOTE — Progress Notes (Signed)
This encounter was created in error - please disregard.

## 2012-06-28 ENCOUNTER — Other Ambulatory Visit: Payer: Self-pay | Admitting: Emergency Medicine

## 2012-06-28 ENCOUNTER — Telehealth: Payer: Self-pay | Admitting: *Deleted

## 2012-06-28 ENCOUNTER — Other Ambulatory Visit: Payer: Self-pay | Admitting: Adult Health

## 2012-06-28 ENCOUNTER — Telehealth: Payer: Self-pay | Admitting: Oncology

## 2012-06-28 DIAGNOSIS — C50911 Malignant neoplasm of unspecified site of right female breast: Secondary | ICD-10-CM

## 2012-06-28 DIAGNOSIS — C50912 Malignant neoplasm of unspecified site of left female breast: Secondary | ICD-10-CM

## 2012-06-28 MED ORDER — DEXAMETHASONE 4 MG PO TABS: ORAL_TABLET | ORAL | Status: DC

## 2012-06-28 MED ORDER — LORAZEPAM 0.5 MG PO TABS: 0.5000 mg | ORAL_TABLET | Freq: Four times a day (QID) | ORAL | Status: DC | PRN

## 2012-06-28 MED ORDER — PROCHLORPERAZINE MALEATE 10 MG PO TABS: 10.0000 mg | ORAL_TABLET | Freq: Four times a day (QID) | ORAL | Status: DC | PRN

## 2012-06-28 MED ORDER — ONDANSETRON HCL 8 MG PO TABS: 8.0000 mg | ORAL_TABLET | Freq: Two times a day (BID) | ORAL | Status: DC | PRN

## 2012-06-28 MED ORDER — PROCHLORPERAZINE 25 MG RE SUPP: 25.0000 mg | Freq: Two times a day (BID) | RECTAL | Status: DC | PRN

## 2012-06-28 MED ORDER — LIDOCAINE-PRILOCAINE 2.5-2.5 % EX CREA: TOPICAL_CREAM | CUTANEOUS | Status: DC | PRN

## 2012-06-28 NOTE — Telephone Encounter (Signed)
Per staff message and POF I have scheduled appts.  JMW  

## 2012-06-30 ENCOUNTER — Other Ambulatory Visit: Payer: Self-pay | Admitting: Certified Registered Nurse Anesthetist

## 2012-07-02 ENCOUNTER — Ambulatory Visit (HOSPITAL_BASED_OUTPATIENT_CLINIC_OR_DEPARTMENT_OTHER): Payer: BC Managed Care – PPO | Admitting: Oncology

## 2012-07-02 ENCOUNTER — Telehealth: Payer: Self-pay | Admitting: Oncology

## 2012-07-02 ENCOUNTER — Other Ambulatory Visit (HOSPITAL_BASED_OUTPATIENT_CLINIC_OR_DEPARTMENT_OTHER): Payer: BC Managed Care – PPO

## 2012-07-02 ENCOUNTER — Ambulatory Visit (HOSPITAL_BASED_OUTPATIENT_CLINIC_OR_DEPARTMENT_OTHER): Payer: BC Managed Care – PPO

## 2012-07-02 VITALS — BP 150/81 | HR 94 | Temp 97.8°F | Resp 20 | Ht 64.0 in | Wt 187.7 lb

## 2012-07-02 DIAGNOSIS — Z170 Estrogen receptor positive status [ER+]: Secondary | ICD-10-CM

## 2012-07-02 DIAGNOSIS — C50911 Malignant neoplasm of unspecified site of right female breast: Secondary | ICD-10-CM

## 2012-07-02 DIAGNOSIS — C50119 Malignant neoplasm of central portion of unspecified female breast: Secondary | ICD-10-CM

## 2012-07-02 DIAGNOSIS — C50919 Malignant neoplasm of unspecified site of unspecified female breast: Secondary | ICD-10-CM

## 2012-07-02 DIAGNOSIS — C50912 Malignant neoplasm of unspecified site of left female breast: Secondary | ICD-10-CM

## 2012-07-02 DIAGNOSIS — Z9010 Acquired absence of unspecified breast and nipple: Secondary | ICD-10-CM

## 2012-07-02 DIAGNOSIS — Z5111 Encounter for antineoplastic chemotherapy: Secondary | ICD-10-CM

## 2012-07-02 LAB — COMPREHENSIVE METABOLIC PANEL (CC13)
ALT: 20 U/L (ref 0–55)
AST: 22 U/L (ref 5–34)
Albumin: 3.5 g/dL (ref 3.5–5.0)
Alkaline Phosphatase: 104 U/L (ref 40–150)
BUN: 12 mg/dL (ref 7.0–26.0)
CO2: 29 mEq/L (ref 22–29)
Calcium: 9.8 mg/dL (ref 8.4–10.4)
Chloride: 102 mEq/L (ref 98–107)
Creatinine: 0.7 mg/dL (ref 0.6–1.1)
Glucose: 109 mg/dl — ABNORMAL HIGH (ref 70–99)
Potassium: 4.1 mEq/L (ref 3.5–5.1)
Sodium: 142 mEq/L (ref 136–145)
Total Bilirubin: 0.31 mg/dL (ref 0.20–1.20)
Total Protein: 6.9 g/dL (ref 6.4–8.3)

## 2012-07-02 LAB — CBC WITH DIFFERENTIAL/PLATELET
BASO%: 1 % (ref 0.0–2.0)
Basophils Absolute: 0 10*3/uL (ref 0.0–0.1)
EOS%: 3.6 % (ref 0.0–7.0)
Eosinophils Absolute: 0.2 10*3/uL (ref 0.0–0.5)
HCT: 38.1 % (ref 34.8–46.6)
HGB: 12.6 g/dL (ref 11.6–15.9)
LYMPH%: 40.9 % (ref 14.0–49.7)
MCH: 29.5 pg (ref 25.1–34.0)
MCHC: 33 g/dL (ref 31.5–36.0)
MCV: 89.5 fL (ref 79.5–101.0)
MONO#: 0.5 10*3/uL (ref 0.1–0.9)
MONO%: 10.2 % (ref 0.0–14.0)
NEUT#: 2 10*3/uL (ref 1.5–6.5)
NEUT%: 44.3 % (ref 38.4–76.8)
Platelets: 303 10*3/uL (ref 145–400)
RBC: 4.26 10*6/uL (ref 3.70–5.45)
RDW: 13.5 % (ref 11.2–14.5)
WBC: 4.6 10*3/uL (ref 3.9–10.3)
lymph#: 1.9 10*3/uL (ref 0.9–3.3)

## 2012-07-02 MED ORDER — SODIUM CHLORIDE 0.9 % IV SOLN
Freq: Once | INTRAVENOUS | Status: AC
  Administered 2012-07-02: 13:00:00 via INTRAVENOUS

## 2012-07-02 MED ORDER — DOXORUBICIN HCL CHEMO IV INJECTION 2 MG/ML
60.0000 mg/m2 | Freq: Once | INTRAVENOUS | Status: AC
  Administered 2012-07-02: 118 mg via INTRAVENOUS
  Filled 2012-07-02: qty 59

## 2012-07-02 MED ORDER — HEPARIN SOD (PORK) LOCK FLUSH 100 UNIT/ML IV SOLN
500.0000 [IU] | Freq: Once | INTRAVENOUS | Status: AC | PRN
  Administered 2012-07-02: 500 [IU]
  Filled 2012-07-02: qty 5

## 2012-07-02 MED ORDER — SODIUM CHLORIDE 0.9 % IV SOLN
150.0000 mg | Freq: Once | INTRAVENOUS | Status: AC
  Administered 2012-07-02: 150 mg via INTRAVENOUS
  Filled 2012-07-02: qty 5

## 2012-07-02 MED ORDER — LORAZEPAM 2 MG/ML IJ SOLN
0.5000 mg | Freq: Once | INTRAMUSCULAR | Status: AC
  Administered 2012-07-02: 0.5 mg via INTRAVENOUS

## 2012-07-02 MED ORDER — DEXAMETHASONE SODIUM PHOSPHATE 20 MG/5ML IJ SOLN
12.0000 mg | Freq: Once | INTRAMUSCULAR | Status: AC
  Administered 2012-07-02: 12 mg via INTRAVENOUS

## 2012-07-02 MED ORDER — PALONOSETRON HCL INJECTION 0.25 MG/5ML
0.2500 mg | Freq: Once | INTRAVENOUS | Status: AC
  Administered 2012-07-02: 0.25 mg via INTRAVENOUS

## 2012-07-02 MED ORDER — PANTOPRAZOLE SODIUM 20 MG PO TBEC: 20.0000 mg | DELAYED_RELEASE_TABLET | Freq: Every day | ORAL | Status: DC

## 2012-07-02 MED ORDER — SODIUM CHLORIDE 0.9 % IJ SOLN
10.0000 mL | INTRAMUSCULAR | Status: DC | PRN
  Administered 2012-07-02: 10 mL
  Filled 2012-07-02: qty 10

## 2012-07-02 MED ORDER — SODIUM CHLORIDE 0.9 % IV SOLN
600.0000 mg/m2 | Freq: Once | INTRAVENOUS | Status: AC
  Administered 2012-07-02: 1180 mg via INTRAVENOUS
  Filled 2012-07-02: qty 59

## 2012-07-02 NOTE — Progress Notes (Signed)
Positive blood return noted before, during and after adriamycin administration.

## 2012-07-02 NOTE — Patient Instructions (Addendum)
[facility] Discharge Instructions for Patients Receiving Chemotherapy  Today you received the following chemotherapy agents Adriamycin/Cytoxan To help prevent nausea and vomiting after your treatment, we encourage you to take your nausea medication as prescribed.  If you develop nausea and vomiting that is not controlled by your nausea medication, call the clinic. If it is after clinic hours your family physician or the after hours number for the clinic or go to the Emergency Department. *Do not take Ondansetron (Zofran) until Tuesday. BELOW ARE SYMPTOMS THAT SHOULD BE REPORTED IMMEDIATELY:  *FEVER GREATER THAN 100.5 F  *CHILLS WITH OR WITHOUT FEVER  NAUSEA AND VOMITING THAT IS NOT CONTROLLED WITH YOUR NAUSEA MEDICATION  *UNUSUAL SHORTNESS OF BREATH  *UNUSUAL BRUISING OR BLEEDING  TENDERNESS IN MOUTH AND THROAT WITH OR WITHOUT PRESENCE OF ULCERS  *URINARY PROBLEMS  *BOWEL PROBLEMS  UNUSUAL RASH Items with * indicate a potential emergency and should be followed up as soon as possible.  One of the nurses will contact you 24 hours after your treatment. Please let the nurse know about any problems that you may have experienced. Feel free to call the clinic you have any questions or concerns. The clinic phone number is [phone].   I have been informed and understand all the instructions given to me. I know to contact the clinic, my physician, or go to the Emergency Department if any problems should occur. I do not have any questions at this time, but understand that I may call the clinic during office hours   should I have any questions or need assistance in obtaining follow up care.    __________________________________________  _____________  __________ Signature of Patient or Authorized Representative            Date                   Time    __________________________________________ Nurse's Signature    Cyclophosphamide injection What is this  medicine? CYCLOPHOSPHAMIDE (sye kloe FOSS fa mide) is a chemotherapy drug. It slows the growth of cancer cells. This medicine is used to treat many types of cancer like lymphoma, myeloma, leukemia, breast cancer, and ovarian cancer, to name a few. It is also used to treat nephrotic syndrome in children. This medicine may be used for other purposes; ask your health care provider or pharmacist if you have questions. What should I tell my health care provider before I take this medicine? They need to know if you have any of these conditions: -blood disorders -history of other chemotherapy -history of radiation therapy -infection -kidney disease -liver disease -tumors in the bone marrow -an unusual or allergic reaction to cyclophosphamide, other chemotherapy, other medicines, foods, dyes, or preservatives -pregnant or trying to get pregnant -breast-feeding How should I use this medicine? This drug is usually given as an injection into a vein or muscle or by infusion into a vein. It is administered in a hospital or clinic by a specially trained health care professional. Talk to your pediatrician regarding the use of this medicine in children. While this drug may be prescribed for selected conditions, precautions do apply. Overdosage: If you think you have taken too much of this medicine contact a poison control center or emergency room at once. NOTE: This medicine is only for you. Do not share this medicine with others. What if I miss a dose? It is important not to miss your dose. Call your doctor or health care professional if you are unable to  keep an appointment. What may interact with this medicine? Do not take this medicine with any of the following medications: -mibefradil -nalidixic acid This medicine may also interact with the following medications: -doxorubicin -etanercept -medicines to increase blood counts like filgrastim, pegfilgrastim, sargramostim -medicines that block muscle  or nerve pain -St. John's Wort -phenobarbital -succinylcholine chloride -trastuzumab -vaccines Talk to your doctor or health care professional before taking any of these medicines: -acetaminophen -aspirin -ibuprofen -ketoprofen -naproxen This list may not describe all possible interactions. Give your health care provider a list of all the medicines, herbs, non-prescription drugs, or dietary supplements you use. Also tell them if you smoke, drink alcohol, or use illegal drugs. Some items may interact with your medicine. What should I watch for while using this medicine? Visit your doctor for checks on your progress. This drug may make you feel generally unwell. This is not uncommon, as chemotherapy can affect healthy cells as well as cancer cells. Report any side effects. Continue your course of treatment even though you feel ill unless your doctor tells you to stop. Drink water or other fluids as directed. Urinate often, even at night. In some cases, you may be given additional medicines to help with side effects. Follow all directions for their use. Call your doctor or health care professional for advice if you get a fever, chills or sore throat, or other symptoms of a cold or flu. Do not treat yourself. This drug decreases your body's ability to fight infections. Try to avoid being around people who are sick. This medicine may increase your risk to bruise or bleed. Call your doctor or health care professional if you notice any unusual bleeding. Be careful brushing and flossing your teeth or using a toothpick because you may get an infection or bleed more easily. If you have any dental work done, tell your dentist you are receiving this medicine. Avoid taking products that contain aspirin, acetaminophen, ibuprofen, naproxen, or ketoprofen unless instructed by your doctor. These medicines may hide a fever. Do not become pregnant while taking this medicine. Women should inform their doctor if  they wish to become pregnant or think they might be pregnant. There is a potential for serious side effects to an unborn child. Talk to your health care professional or pharmacist for more information. Do not breast-feed an infant while taking this medicine. Men should inform their doctor if they wish to father a child. This medicine may lower sperm counts. If you are going to have surgery, tell your doctor or health care professional that you have taken this medicine. What side effects may I notice from receiving this medicine? Side effects that you should report to your doctor or health care professional as soon as possible: -allergic reactions like skin rash, itching or hives, swelling of the face, lips, or tongue -low blood counts - this medicine may decrease the number of white blood cells, red blood cells and platelets. You may be at increased risk for infections and bleeding. -signs of infection - fever or chills, cough, sore throat, pain or difficulty passing urine -signs of decreased platelets or bleeding - bruising, pinpoint red spots on the skin, black, tarry stools, blood in the urine -signs of decreased red blood cells - unusually weak or tired, fainting spells, lightheadedness -breathing problems -dark urine -mouth sores -pain, swelling, redness at site where injected -swelling of the ankles, feet, hands -trouble passing urine or change in the amount of urine -weight gain -yellowing of the eyes or skin  Side effects that usually do not require medical attention (report to your doctor or health care professional if they continue or are bothersome): -changes in nail or skin color -diarrhea -hair loss -loss of appetite -missed menstrual periods -nausea, vomiting -stomach pain This list may not describe all possible side effects. Call your doctor for medical advice about side effects. You may report side effects to FDA at 1-800-FDA-1088. Where should I keep my medicine? This drug  is given in a hospital or clinic and will not be stored at home. NOTE: This sheet is a summary. It may not cover all possible information. If you have questions about this medicine, talk to your doctor, pharmacist, or health care provider.  2013, [employer]. (05/11/2007 2:32:25 PM)   Doxorubicin injection What is this medicine? DOXORUBICIN (dox oh ROO bi sin) is a chemotherapy drug. It is used to treat many kinds of cancer like Hodgkin's disease, leukemia, non-Hodgkin's lymphoma, neuroblastoma, sarcoma, and Wilms' tumor. It is also used to treat bladder cancer, breast cancer, lung cancer, ovarian cancer, stomach cancer, and thyroid cancer. This medicine may be used for other purposes; ask your health care provider or pharmacist if you have questions. What should I tell my health care provider before I take this medicine? They need to know if you have any of these conditions: -blood disorders -heart disease, recent heart attack -infection (especially a virus infection such as chickenpox, cold sores, or herpes) -irregular heartbeat -liver disease -recent or ongoing radiation therapy -an unusual or allergic reaction to doxorubicin, other chemotherapy agents, other medicines, foods, dyes, or preservatives -pregnant or trying to get pregnant -breast-feeding How should I use this medicine? This drug is given as an infusion into a vein. It is administered in a hospital or clinic by a specially trained health care professional. If you have pain, swelling, burning or any unusual feeling around the site of your injection, tell your health care professional right away. Talk to your pediatrician regarding the use of this medicine in children. Special care may be needed. Overdosage: If you think you have taken too much of this medicine contact a poison control center or emergency room at once. NOTE: This medicine is only for you. Do not share this medicine with others. What if I miss a  dose? It is important not to miss your dose. Call your doctor or health care professional if you are unable to keep an appointment. What may interact with this medicine? Do not take this medicine with any of the following medications: -cisapride -droperidol -halofantrine -pimozide -zidovudine This medicine may also interact with the following medications: -chloroquine -chlorpromazine -clarithromycin -cyclophosphamide -cyclosporine -erythromycin -medicines for depression, anxiety, or psychotic disturbances -medicines for irregular heart beat like amiodarone, bepridil, dofetilide, encainide, flecainide, propafenone, quinidine -medicines for seizures like ethotoin, fosphenytoin, phenytoin -medicines for nausea, vomiting like dolasetron, ondansetron, palonosetron -medicines to increase blood counts like filgrastim, pegfilgrastim, sargramostim -methadone -methotrexate -pentamidine -progesterone -vaccines -verapamil Talk to your doctor or health care professional before taking any of these medicines: -acetaminophen -aspirin -ibuprofen -ketoprofen -naproxen This list may not describe all possible interactions. Give your health care provider a list of all the medicines, herbs, non-prescription drugs, or dietary supplements you use. Also tell them if you smoke, drink alcohol, or use illegal drugs. Some items may interact with your medicine. What should I watch for while using this medicine? Your condition will be monitored carefully while you are receiving this medicine. You will need important blood work done while you are taking  this medicine. This drug may make you feel generally unwell. This is not uncommon, as chemotherapy can affect healthy cells as well as cancer cells. Report any side effects. Continue your course of treatment even though you feel ill unless your doctor tells you to stop. Your urine may turn red for a few days after your dose. This is not blood. If your urine is  dark or brown, call your doctor. In some cases, you may be given additional medicines to help with side effects. Follow all directions for their use. Call your doctor or health care professional for advice if you get a fever, chills or sore throat, or other symptoms of a cold or flu. Do not treat yourself. This drug decreases your body's ability to fight infections. Try to avoid being around people who are sick. This medicine may increase your risk to bruise or bleed. Call your doctor or health care professional if you notice any unusual bleeding. Be careful brushing and flossing your teeth or using a toothpick because you may get an infection or bleed more easily. If you have any dental work done, tell your dentist you are receiving this medicine. Avoid taking products that contain aspirin, acetaminophen, ibuprofen, naproxen, or ketoprofen unless instructed by your doctor. These medicines may hide a fever. Men and women of childbearing age should use effective birth control methods while using taking this medicine. Do not become pregnant while taking this medicine. There is a potential for serious side effects to an unborn child. Talk to your health care professional or pharmacist for more information. Do not breast-feed an infant while taking this medicine. Do not let others touch your urine or other body fluids for 5 days after each treatment with this medicine. Caregivers should wear latex gloves to avoid touching body fluids during this time. What side effects may I notice from receiving this medicine? Side effects that you should report to your doctor or health care professional as soon as possible: -allergic reactions like skin rash, itching or hives, swelling of the face, lips, or tongue -low blood counts - this medicine may decrease the number of white blood cells, red blood cells and platelets. You may be at increased risk for infections and bleeding. -signs of infection - fever or chills,  cough, sore throat, pain or difficulty passing urine -signs of decreased platelets or bleeding - bruising, pinpoint red spots on the skin, black, tarry stools, blood in the urine -signs of decreased red blood cells - unusually weak or tired, fainting spells, lightheadedness -breathing problems -chest pain -fast, irregular heartbeat -mouth sores -nausea, vomiting -pain, swelling, redness at site where injected -pain, tingling, numbness in the hands or feet -swelling of ankles, feet, or hands -unusual bleeding or bruising Side effects that usually do not require medical attention (report to your doctor or health care professional if they continue or are bothersome): -diarrhea -facial flushing -hair loss -loss of appetite -missed menstrual periods -nail discoloration or damage -red or watery eyes -red colored urine -stomach upset This list may not describe all possible side effects. Call your doctor for medical advice about side effects. You may report side effects to FDA at 1-800-FDA-1088. Where should I keep my medicine? This drug is given in a hospital or clinic and will not be stored at home. NOTE: This sheet is a summary. It may not cover all possible information. If you have questions about this medicine, talk to your doctor, pharmacist, or health care provider.  2013, [employer]. (05/25/2007  5:07:32 PM)

## 2012-07-02 NOTE — Patient Instructions (Addendum)
Proceed with chemotherapy today  I will see you back in 1 week for follow up 

## 2012-07-03 ENCOUNTER — Ambulatory Visit (HOSPITAL_BASED_OUTPATIENT_CLINIC_OR_DEPARTMENT_OTHER): Payer: BC Managed Care – PPO

## 2012-07-03 VITALS — BP 153/88 | HR 76 | Temp 97.4°F | Resp 20

## 2012-07-03 DIAGNOSIS — C50119 Malignant neoplasm of central portion of unspecified female breast: Secondary | ICD-10-CM

## 2012-07-03 DIAGNOSIS — C50919 Malignant neoplasm of unspecified site of unspecified female breast: Secondary | ICD-10-CM

## 2012-07-03 DIAGNOSIS — Z5189 Encounter for other specified aftercare: Secondary | ICD-10-CM

## 2012-07-03 MED ORDER — PEGFILGRASTIM INJECTION 6 MG/0.6ML
6.0000 mg | Freq: Once | SUBCUTANEOUS | Status: AC
  Administered 2012-07-03: 6 mg via SUBCUTANEOUS

## 2012-07-05 ENCOUNTER — Telehealth: Payer: Self-pay | Admitting: *Deleted

## 2012-07-05 ENCOUNTER — Encounter: Payer: Self-pay | Admitting: Oncology

## 2012-07-05 NOTE — Telephone Encounter (Signed)
Message copied by Augusto Garbe on Mon Jul 05, 2012  4:18 PM ------      Message from: Corky Sing      Created: Fri Jul 02, 2012  2:56 PM      Regarding: Chemo follow up call      Contact: [phone]       First time Adriamycin/Cytoxan please call. Please call. Dr. Welton Flakes patient.            Thanks,      Santina Evans  ------

## 2012-07-05 NOTE — Telephone Encounter (Signed)
Per staff message and POF I have scheduled appts.  JMW  

## 2012-07-05 NOTE — Telephone Encounter (Signed)
Feels tired.  Reports heartburn.  Takes protonix and eating "bland foods".  Taking dexamethasone with food.  Denies n/v.  Drinking fluids well.  No problems with bowel or bladder.  Reports new pain to eyes but bo redness.

## 2012-07-05 NOTE — Progress Notes (Signed)
OFFICE PROGRESS NOTE  CC  [name], MD [address] [place] [address] Dr. 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 Patient is status post bilateral mastectomy with immediate reconstruction  STAGE:  Right breast (mastectomy)  T1cN0 1.2 cm ER-/PR-/her-/ki-67 92% 0/1 nodes positive  Left breast (mastectomy with snl) T1cN0 1.4 cm, 1.2 cm, 0.1c ER 100%, PR 11%, her2neu positive 6.50, Ki-67 80%   PRIOR THERAPY:  #1screening mammogram performed that showed 2 areas of concern on the left side.calcifications were noted in the left. In the right breast a possible mass warranted further evaluation.   #2 On 03/29/2012 patient underwent a bilateral diagnostic mammogram and right breast ultrasound. A spot magnification images demonstrate suspicious group of pleomorphic microcalcifications over the outer lower left breast with additional suspicious group of pleomorphic microcalcifications over the outer midportion of the left periareolar region. Spot compression images of the right breast demonstrate persistence of a 1 cm density at the edge of the film in the deep third of the right inner breast.   #3Ultrasound performed showed no focal abnormality over the entire in her right breast to correspond to the mammographic density. Patient was recommended stereotactic core needle biopsy of the 2 groups of suspicious left breast microcalcifications. Because patient and her husband wanted bilateral mastectomies MRI was not performed.on 04/15/2012 patient had needle core biopsy performed of the 2 areas in the left breast. The left needle core biopsy in the lower breast revealed ductal carcinoma in situ with associated comedo necrosis and calcifications with the in situ carcinoma. It was ER +100% PR +12%. 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 Tumor was ER +100% PR +11% proliferation marker Ki-67 elevated at 80% HER-2/neu showed amplification with a ratio of 6.50.   #4 patient is now status post bilateral mastectomies performed on 05/14/2012 with immediate reconstruction. The final pathology revealed bilateral breast cancers. In the right breast tissue is noted to have a 1.2 cm invasive ductal carcinoma high-grade ER negative PR negative HER-2/neu negative with Ki-67 92% one node was negative for metastatic disease but this was not the sentinel lymph node. On the left side the patient had multifocal disease measuring 1.4 cm, 1.2 cm, 0.1 cm. Tumor was ER +100% PR +11% HER-2/neu positive with a ratio 6.50. Ki-67 was 80% 8 sentinel nodes were negative for metastatic disease.  #5 patient has had immediate reconstruction with expanders bilaterally.  #6 patient will now begin adjuvant chemotherapy initially consisting of Adriamycin Cytoxan every 2 weeks for a total of 4 cycles. This will then be followed by Taxol and Herceptin to be given weekly for 12 weeks time.   CURRENT THERAPY: cycle 1 day 1 of Adriamycin Cytoxan starting 07/02/2012  INTERVAL HISTORY: [name] [name] 62 y.o. female returns for followup visit. Overall she is feeling well she is accompanied by her husband. She is ready to get started on her chemotherapy. She has her Port-A-Cath placed. She also has her anti-emetics. She denies any fevers chills night sweats headaches she is anxious. We will give her Ativan in her IV today. She has no peripheral paresthesias no myalgias and arthralgias no shortness of breath cough hemoptysis hematemesis no chest pains. Remainder of the 10 point review of systems is negative.  MEDICAL HISTORY: Past Medical  History  Diagnosis Date  . Heart murmur   . Diabetes mellitus without complication   . Osteopenia   . Cancer     breast  .  Bronchitis   . Pneumonia   . Hiatal hernia   . Sinus problem   . Diabetes mellitus   . Breast cancer 04/15/12    left-biopsy  . Breast cancer, right breast 05/14/12    right mastectomy    ALLERGIES:  is allergic to darvon and penicillins.  MEDICATIONS:  Current Outpatient Prescriptions  Medication Sig Dispense Refill  . Ascorbic Acid (VITAMIN C) 500 MG tablet Take 500 mg by mouth daily.        . Biotin 10 MG TABS Take by mouth. 4000 mcg daily      . calcitonin, salmon, (MIACALCIN/FORTICAL) 200 UNIT/ACT nasal spray Place 1 spray into the nose daily.      . Calcium Citrate (CITRACAL PO) Take by mouth.      . Cholecalciferol (VITAMIN D PO) Take 1 tablet by mouth daily.      Marland Kitchen KRILL OIL PO Take by mouth.      . lidocaine-prilocaine (EMLA) cream Apply topically as needed.  30 g  1  . Multiple Vitamin (MULTIVITAMIN PO) Take 1 tablet by mouth daily.       . pantoprazole (PROTONIX) 40 MG tablet Take 40 mg by mouth daily.        . Probiotic Product (PROBIOTIC DAILY PO) Take by mouth daily.      . rosuvastatin (CRESTOR) 5 MG tablet Take 5 mg by mouth every Monday, Wednesday, and Friday.      Marland Kitchen UNABLE TO FIND Apply 1 Device topically once. Cranial prosthesis r/t chemotherapy treatment  1 Device  0  . dexamethasone (DECADRON) 4 MG tablet Take 2 tablets by mouth once a day on the day after chemotherapy and then take 2 tablets two times a day for 2 days. Take with food.  30 tablet  1  . docusate sodium 100 MG CAPS Take 100 mg by mouth 2 (two) times daily.  30 capsule  1  . LORazepam (ATIVAN) 0.5 MG tablet Take 1 tablet (0.5 mg total) by mouth every 6 (six) hours as needed (Nausea or vomiting).  30 tablet  0  . ondansetron (ZOFRAN) 8 MG tablet Take 1 tablet (8 mg total) by mouth 2 (two) times daily as needed. Take two times a day as needed for nausea or vomiting starting on the third day after chemotherapy.  30 tablet  1  . pantoprazole (PROTONIX) 20 MG tablet Take 1 tablet (20 mg total) by mouth  daily.  30 tablet  7  . prochlorperazine (COMPAZINE) 10 MG tablet Take 1 tablet (10 mg total) by mouth every 6 (six) hours as needed (Nausea or vomiting).  30 tablet  1  . prochlorperazine (COMPAZINE) 25 MG suppository Place 1 suppository (25 mg total) rectally every 12 (twelve) hours as needed for nausea.  12 suppository  3   No current facility-administered medications for this visit.    SURGICAL HISTORY:  Past Surgical History  Procedure Laterality Date  . Cesarean section  1981, 1983  . Tummy tuck  1999  . Tonsillectomy    . Foot surgery    . Gastric bypass    . Wound debridement  01/14/2011    Procedure: DEBRIDEMENT ABDOMINAL WOUND;  Surgeon: Ernestene Mention, MD;  Location: [facility];  Service: General;  Laterality: N/A;  wound debridement and debridement on  the abdomen  . Cosmetic surgery    . Total mastectomy Bilateral 05/14/2012    Procedure: TOTAL MASTECTOMY;  Surgeon: Ernestene Mention, MD;  Location[facility] OR;  Service: General;  Laterality: Bilateral;  . Axillary sentinel node biopsy Left 05/14/2012    Procedure: AXILLARY SENTINEL NODE BIOPSY;  Surgeon: Ernestene Mention, MD;  Location: [facility] OR;  Service: General;  Laterality: Left;  . Portacath placement N/A 05/14/2012    Procedure: INSERTION PORT-A-CATH;  Surgeon: Ernestene Mention, MD;  Location: [facility] OR;  Service: General;  Laterality: N/A;  . Breast reconstruction with placement of tissue expander and flex hd (acellular hydrated dermis) Bilateral 05/14/2012    Procedure: BREAST RECONSTRUCTION WITH PLACEMENT OF TISSUE EXPANDER AND FLEX HD (ACELLULAR HYDRATED DERMIS);  Surgeon: Etter Sjogren, MD;  Location: [facility] OR;  Service: Plastics;  Laterality: Bilateral;  . Bunionectomy      REVIEW OF SYSTEMS:  Pertinent items are noted in HPI.   HEALTH MAINTENANCE:   PHYSICAL EXAMINATION: Blood pressure 150/81, pulse 94, temperature 97.8 F (36.6 C), temperature source Oral, resp. rate 20, height 5\' 4"  (1.626 m), weight 187 lb  11.2 oz (85.14 kg). Body mass index is 32.2 kg/(m^2). ECOG PERFORMANCE STATUS: 0 - Asymptomatic Well-developed well-nourished very pleasant female in no acute distress HEENT exam EOMI PERRLA sclerae anicteric no conjunctival pallor oral mucosa is moist neck is supple lungs are clear cardiovascular regular rate rhythm abdomen soft nontender no HSM extremities no edema neuro is nonfocal Bilateral expanders in place mild tenderness     LABORATORY DATA: Lab Results  Component Value Date   WBC 4.6 07/02/2012   HGB 12.6 07/02/2012   HCT 38.1 07/02/2012   MCV 89.5 07/02/2012   PLT 303 07/02/2012      Chemistry      Component Value Date/Time   NA 142 07/02/2012 1010   NA 139 05/17/2012 0530   K 4.1 07/02/2012 1010   K 3.7 05/17/2012 0530   CL 102 07/02/2012 1010   CL 100 05/17/2012 0530   CO2 29 07/02/2012 1010   CO2 33* 05/17/2012 0530   BUN 12.0 07/02/2012 1010   BUN 4* 05/17/2012 0530   CREATININE 0.7 07/02/2012 1010   CREATININE 0.56 05/17/2012 0530      Component Value Date/Time   CALCIUM 9.8 07/02/2012 1010   CALCIUM 8.7 05/17/2012 0530   ALKPHOS 104 07/02/2012 1010   ALKPHOS 103 05/10/2012 1440   AST 22 07/02/2012 1010   AST 25 05/10/2012 1440   ALT 20 07/02/2012 1010   ALT 17 05/10/2012 1440   BILITOT 0.31 07/02/2012 1010   BILITOT 0.4 05/10/2012 1440    ADDITIONAL INFORMATION: 6. PROGNOSTIC INDICATORS - ACIS Results: IMMUNOHISTOCHEMICAL AND MORPHOMETRIC ANALYSIS BY THE AUTOMATED CELLULAR IMAGING SYSTEM (ACIS) Estrogen Receptor: 0%, NEGATIVE Progesterone Receptor: 0%, NEGATIVE Proliferation Marker Ki67: 92% COMMENT: The negative hormone receptor study(ies) in this case have an internal positive control. REFERENCE RANGE ESTROGEN RECEPTOR NEGATIVE <1% POSITIVE =>1% PROGESTERONE RECEPTOR NEGATIVE <1% POSITIVE =>1% All controls stained appropriately Pecola Leisure MD Pathologist, Electronic Signature ( Signed 05/21/2012) 6. CHROMOGENIC IN-SITU HYBRIDIZATION Results: HER-2/NEU BY CISH  - NO AMPLIFICATION OF HER-2 DETECTED. 1 of 6 Duplicate copy FINAL for Neider, [name] G ([phone]) ADDITIONAL INFORMATION:(continued) RESULT RATIO OF HER2: CEP 17 SIGNALS 1.19 AVERAGE HER2 COPY NUMBER PER CELL 1.85 REFERENCE RANGE NEGATIVE HER2/Chr17 Ratio <2.0 and Average HER2 copy number <4.0 EQUIVOCAL HER2/Chr17 Ratio <2.0 and Average HER2 copy number 4.0 and <6.0 POSITIVE HER2/Chr17 Ratio >=2.0 and/or Average  HER2 copy number >=6.0 Pecola Leisure MD Pathologist, Electronic Signature ( Signed 05/20/2012) FINAL DIAGNOSIS Diagnosis 1. Lymph node, sentinel, biopsy, Left axillary - THERE IS NO EVIDENCE OF CARCINOMA IN 1 OF 1 LYMPH NODE (0/1). 2. Lymph node, sentinel, biopsy, Left axillary - THERE IS NO EVIDENCE OF CARCINOMA IN 1 OF 1 LYMPH NODE (0/1). 3. Lymph node, sentinel, biopsy, Left axillary - THERE IS NO EVIDENCE OF CARCINOMA IN 1 OF 1 LYMPH NODE (0/1). 4. Lymph node, sentinel, biopsy, Left axillary - THERE IS NO EVIDENCE OF CARCINOMA IN 1 OF 1 LYMPH NODE (0/1). 5. Lymph node, sentinel, biopsy, Left axillary - THERE IS NO EVIDENCE OF CARCINOMA IN 1 OF 1 LYMPH NODE (0/1). 6. Breast, simple mastectomy, Right - INVASIVE DUCTAL CARCINOMA, GRADE III/III, SPANNING 1.2 CM. - DUCTAL CARCINOMA IN SITU, HIGH GRADE. - LYMPHOVASCULAR INVASION IS IDENTIFIED. - THERE IS NO EVIDENCE OF CARCINOMA IN 1 OF 1 LYMPH NODE (0/1). - SEE ONCOLOGY TABLE BELOW. 7. Breast, simple mastectomy, Left - INVASIVE DUCTAL CARCINOMA, AT LEAST THREE FOCI, GRADE II/III, SPANNING 1.4 CM, 1.2 CM, AND 0.1 CM. - DUCTAL CARCINOMA IN SITU, INTERMEDIATE GRADE. - LYMPHOVASCULAR INVASION IS IDENTIFIED. - DUCTAL CARCINOMA IN SITU IS FOCALLY 0.2 CM TO THE ANTERIOR/INFERIOR SOFT TISSUE RESECTION MARGIN. - LOBULAR NEOPLASIA (LOBULAR CARCINOMA IN SITU). - THERE IS NO EVIDENCE OF CARCINOMA IN 2 OF 2 LYMPH NODES (0/2). - SEE ONCOLOGY TABLE BELOW. 8. Lymph node, biopsy, Left axillary - THERE IS NO EVIDENCE OF CARCINOMA IN  1 OF 1 LYMPH NODE (0/1). Microscopic Comment 6. BREAST, INVASIVE TUMOR, WITH LYMPH NODE SAMPLING Specimen, including laterality: Right breast Procedure: Simple mastectomy Grade: 3 2 of 6 Duplicate copy FINAL for Overbaugh, [name] G ([id number]) Microscopic Comment(continued) Tubule formation: 3 Nuclear pleomorphism: 3 Mitotic:2 Tumor size (gross measurement): 1.2 cm Margins: Invasive, distance to closest margin: 1.3 cm to the deep margin (gross measurement) In-situ, distance to closest margin: 1.3 cm to the deep margin (gross measurement) Lymphovascular invasion: Present Ductal carcinoma in situ: Present Grade: High grade Extensive intraductal component: Not identified Lobular neoplasia: Not identified in specimen #6 Tumor focality: Unifocal Treatment effect: N/A Extent of tumor: Confined to breast parenchyma Lymph nodes: # examined: 1 Lymph nodes with metastasis: 0 Breast prognostic profile: Will be performed on the current case and the results reported separately. TNM: pT1c, pN0 7. BREAST, INVASIVE TUMOR, WITH LYMPH NODE SAMPLING Specimen, including laterality: Left breast Procedure: Simple mastectomy Grade: 2 Tubule formation: 3 Nuclear pleomorphism: 2 Mitotic:2 Tumor size (gross measurements) 1.4 cm, 1.2 cm, and 0.1 cm Margins: Invasive, distance to closest margin: 0.7 cm to the anterior/inferior soft tissue resection margin In-situ, distance to closest margin: Focally 0.2 cm to the anterior/inferior soft tissue resection margin (glass slide measurement) Lymphovascular invasion: Present Ductal carcinoma in situ: Present Grade: Intermediate grade Extensive intraductal component: Yes Lobular neoplasia: Yes (lobular carcinoma in situ) Tumor focality: Multiple foci Treatment effect: N/A Extent of tumor: Confined to breast parenchyma Lymph nodes: # examined: 8 Lymph nodes with metastasis: 0 Breast prognostic profile: [id number] Estrogen receptor: 100% strong  staining intensity Progesterone receptor: 11% strong staining intensity Her 2 neu: Amplification was detected. The ratio was 6.50. 3 of 6 Duplicate copy FINAL for Lashomb, [name] G ([id number]) Microscopic Comment(continued) Ki-67: 80% TNM: mpT1c, pN0 Comments: In addition to the two grossly identified nodules, there is invasive carcinoma present in random tissue submitted from the inferior lateral quadrant. The three foci of carcinoma are morphologically similar.   RADIOGRAPHIC STUDIES:  Nm Pet Image  Initial (pi) Skull Base To Thigh  05/12/2012  *RADIOLOGY REPORT*  Clinical Data: Initial treatment strategy for breast cancer.   NUCLEAR MEDICINE PET SKULL BASE TO THIGH  Fasting Blood Glucose:  143  Technique:  20 mCi F-18 FDG was injected intravenously. CT data was obtained and used for attenuation correction and anatomic localization only.  (This was not acquired as a diagnostic CT examination.) Additional exam technical data entered on technologist worksheet.  Comparison:  none  Findings:  Neck: No hypermetabolic lymph nodes in the neck.  Chest:  In the inferior medial aspect of the right breast there is a 10 mm nodule (image 99) with moderate  metabolic activity ( SUV max = 2.9).  No hypermetabolic mediastinal or hilar lymph nodes.  There is hypermetabolic activity in the thoracic inlet which is felt to be vascular in nature.  There is activity adjacent to the thoracic aorta which is also felt to be vascular may relate to the hemiazygos vein.  Abdomen/Pelvis:  No abnormal hypermetabolic activity within the liver, pancreas, adrenal glands, or spleen.  No hypermetabolic lymph nodes in the abdomen or pelvis.  Skeleton:  No focal hypermetabolic activity to suggest skeletal metastasis.  IMPRESSION:  1.  No evidence of distant breast cancer metastasis. 2.  Hypermetabolic nodule within the inferior medial right breast   Original Report Authenticated By: Genevive Bi, M.D.    Nm Sentinel Node Inj-no  Rpt (breast)  05/14/2012  CLINICAL DATA: cancer left breast   Sulfur colloid was injected intradermally by the nuclear medicine  technologist for breast cancer sentinel node localization.     Dg Chest Port 1 View  05/14/2012  *RADIOLOGY REPORT*  Clinical Data: Status post Port-A-Cath placement  PORTABLE CHEST - 1 VIEW  Comparison: None.  Findings: The cardiac shadow is within normal limits.  Bilateral tissue expanders as well as surgical drains are identified.  A right chest wall port is seen. The catheter tip is at the cavoatrial junction.  No pneumothorax is noted.  The lungs are clear bilaterally.  IMPRESSION: No evidence of pneumothorax following port placement.  Bilateral tissue expanders.   Original Report Authenticated By: Alcide Clever, M.D.    Dg Fluoro Guide Cv Line-no Report  05/14/2012  CLINICAL DATA: LEFT BREAST CANCER/PORT-A-CATH   FLOURO GUIDE CV LINE  Fluoroscopy was utilized by the requesting physician.  No radiographic  interpretation.      ASSESSMENT: 62 year old female with  #1 bilateral breast cancers on the right side she has triple-negative disease stage I left side stage I ER positive PR positive HER-2/neu positive disease. Patient is status post bilateral mastectomies. Of note on the right side the lymph node removed was not a sentinel lymph node. It is unclear what to do with this side in terms of whether or not to do further sentinel lymph node. Clinically patient seems to be doing well.  #2 we discussed her pathology in detail. We discussed chemotherapy to be given adjuvantly. We discussed Adriamycin Cytoxan given every 2 weeks for a total of 4 cycles followed by Taxol and Herceptin given weekly x12 weeks followed by Herceptin every 3 weeks to finish out a total of one year of therapy. We also discussed role of adjuvant hormonal therapy since the left side is ER positive.  #3 patient is now here (5/16) to begin cycle 1 day 1 of Adriamycin and Cytoxan to be given every 2  weeks for a total of 4 cycles   PLAN:   #1 proceed with cycle  1 day 1 of Adriamycin and Cytoxan.  #2 I will see her back in one week's time for lab in followup   All questions were answered. The patient knows to call the clinic with any problems, questions or concerns. We can certainly see the patient much sooner if necessary.  I spent 25 minutes counseling the patient face to face. The total time spent in the appointment was 30 minutes.    Drue Second, MD Medical/Oncology [facility] [phone] (beeper) [phone] (Office)  07/05/2012, 11:03 PM

## 2012-07-09 ENCOUNTER — Other Ambulatory Visit (HOSPITAL_BASED_OUTPATIENT_CLINIC_OR_DEPARTMENT_OTHER): Payer: BC Managed Care – PPO | Admitting: Lab

## 2012-07-09 ENCOUNTER — Ambulatory Visit (HOSPITAL_BASED_OUTPATIENT_CLINIC_OR_DEPARTMENT_OTHER): Payer: BC Managed Care – PPO | Admitting: Oncology

## 2012-07-09 ENCOUNTER — Encounter: Payer: Self-pay | Admitting: Oncology

## 2012-07-09 VITALS — BP 147/83 | HR 88 | Temp 98.5°F | Resp 20 | Ht 64.0 in | Wt 188.0 lb

## 2012-07-09 DIAGNOSIS — C50112 Malignant neoplasm of central portion of left female breast: Secondary | ICD-10-CM

## 2012-07-09 DIAGNOSIS — C50911 Malignant neoplasm of unspecified site of right female breast: Secondary | ICD-10-CM

## 2012-07-09 DIAGNOSIS — D702 Other drug-induced agranulocytosis: Secondary | ICD-10-CM

## 2012-07-09 DIAGNOSIS — C50919 Malignant neoplasm of unspecified site of unspecified female breast: Secondary | ICD-10-CM

## 2012-07-09 DIAGNOSIS — R5381 Other malaise: Secondary | ICD-10-CM

## 2012-07-09 DIAGNOSIS — C50119 Malignant neoplasm of central portion of unspecified female breast: Secondary | ICD-10-CM

## 2012-07-09 LAB — CBC WITH DIFFERENTIAL/PLATELET
BASO%: 0.3 % (ref 0.0–2.0)
Basophils Absolute: 0 10*3/uL (ref 0.0–0.1)
EOS%: 7.7 % — ABNORMAL HIGH (ref 0.0–7.0)
Eosinophils Absolute: 0.2 10*3/uL (ref 0.0–0.5)
HCT: 36 % (ref 34.8–46.6)
HGB: 12.2 g/dL (ref 11.6–15.9)
LYMPH%: 36.2 % (ref 14.0–49.7)
MCH: 30 pg (ref 25.1–34.0)
MCHC: 33.8 g/dL (ref 31.5–36.0)
MCV: 88.9 fL (ref 79.5–101.0)
MONO#: 0.1 10*3/uL (ref 0.1–0.9)
MONO%: 3.5 % (ref 0.0–14.0)
NEUT#: 1.3 10*3/uL — ABNORMAL LOW (ref 1.5–6.5)
NEUT%: 52.3 % (ref 38.4–76.8)
Platelets: 207 10*3/uL (ref 145–400)
RBC: 4.05 10*6/uL (ref 3.70–5.45)
RDW: 13.4 % (ref 11.2–14.5)
WBC: 2.6 10*3/uL — ABNORMAL LOW (ref 3.9–10.3)
lymph#: 0.9 10*3/uL (ref 0.9–3.3)

## 2012-07-09 LAB — COMPREHENSIVE METABOLIC PANEL (CC13)
ALT: 18 U/L (ref 0–55)
AST: 15 U/L (ref 5–34)
Albumin: 3.5 g/dL (ref 3.5–5.0)
Alkaline Phosphatase: 117 U/L (ref 40–150)
BUN: 12.7 mg/dL (ref 7.0–26.0)
CO2: 28 mEq/L (ref 22–29)
Calcium: 9.6 mg/dL (ref 8.4–10.4)
Chloride: 101 mEq/L (ref 98–107)
Creatinine: 0.6 mg/dL (ref 0.6–1.1)
Glucose: 140 mg/dl — ABNORMAL HIGH (ref 70–99)
Potassium: 3.8 mEq/L (ref 3.5–5.1)
Sodium: 141 mEq/L (ref 136–145)
Total Bilirubin: 0.58 mg/dL (ref 0.20–1.20)
Total Protein: 6.5 g/dL (ref 6.4–8.3)

## 2012-07-09 MED ORDER — CIPROFLOXACIN HCL 500 MG PO TABS: 500.0000 mg | ORAL_TABLET | Freq: Two times a day (BID) | ORAL | Status: DC

## 2012-07-09 NOTE — Patient Instructions (Addendum)
Begin cipro 500 mg twice a day for 7 days  Call with any problems  I will see you back in 1 week for cycle 2 of chemotherapy

## 2012-07-16 ENCOUNTER — Other Ambulatory Visit (HOSPITAL_BASED_OUTPATIENT_CLINIC_OR_DEPARTMENT_OTHER): Payer: BC Managed Care – PPO | Admitting: Lab

## 2012-07-16 ENCOUNTER — Other Ambulatory Visit: Payer: BC Managed Care – PPO | Admitting: Lab

## 2012-07-16 ENCOUNTER — Ambulatory Visit (HOSPITAL_BASED_OUTPATIENT_CLINIC_OR_DEPARTMENT_OTHER): Payer: BC Managed Care – PPO | Admitting: Oncology

## 2012-07-16 ENCOUNTER — Ambulatory Visit (HOSPITAL_BASED_OUTPATIENT_CLINIC_OR_DEPARTMENT_OTHER): Payer: BC Managed Care – PPO

## 2012-07-16 ENCOUNTER — Encounter: Payer: Self-pay | Admitting: Oncology

## 2012-07-16 VITALS — BP 144/83 | HR 77 | Temp 97.7°F | Resp 20 | Ht 64.0 in | Wt 187.7 lb

## 2012-07-16 DIAGNOSIS — Z5111 Encounter for antineoplastic chemotherapy: Secondary | ICD-10-CM

## 2012-07-16 DIAGNOSIS — C50911 Malignant neoplasm of unspecified site of right female breast: Secondary | ICD-10-CM

## 2012-07-16 DIAGNOSIS — R5381 Other malaise: Secondary | ICD-10-CM

## 2012-07-16 DIAGNOSIS — C50919 Malignant neoplasm of unspecified site of unspecified female breast: Secondary | ICD-10-CM

## 2012-07-16 DIAGNOSIS — C50119 Malignant neoplasm of central portion of unspecified female breast: Secondary | ICD-10-CM

## 2012-07-16 DIAGNOSIS — C50912 Malignant neoplasm of unspecified site of left female breast: Secondary | ICD-10-CM

## 2012-07-16 DIAGNOSIS — R5383 Other fatigue: Secondary | ICD-10-CM

## 2012-07-16 LAB — COMPREHENSIVE METABOLIC PANEL (CC13)
ALT: 22 U/L (ref 0–55)
AST: 20 U/L (ref 5–34)
Albumin: 3.7 g/dL (ref 3.5–5.0)
Alkaline Phosphatase: 113 U/L (ref 40–150)
BUN: 9.7 mg/dL (ref 7.0–26.0)
CO2: 28 mEq/L (ref 22–29)
Calcium: 9.3 mg/dL (ref 8.4–10.4)
Chloride: 104 mEq/L (ref 98–107)
Creatinine: 0.6 mg/dL (ref 0.6–1.1)
Glucose: 128 mg/dl — ABNORMAL HIGH (ref 70–99)
Potassium: 3.6 mEq/L (ref 3.5–5.1)
Sodium: 141 mEq/L (ref 136–145)
Total Bilirubin: 0.22 mg/dL (ref 0.20–1.20)
Total Protein: 6.5 g/dL (ref 6.4–8.3)

## 2012-07-16 LAB — CBC WITH DIFFERENTIAL/PLATELET
BASO%: 0.6 % (ref 0.0–2.0)
Basophils Absolute: 0.1 10*3/uL (ref 0.0–0.1)
EOS%: 0.6 % (ref 0.0–7.0)
Eosinophils Absolute: 0.1 10*3/uL (ref 0.0–0.5)
HCT: 39 % (ref 34.8–46.6)
HGB: 12.8 g/dL (ref 11.6–15.9)
LYMPH%: 25.1 % (ref 14.0–49.7)
MCH: 29.2 pg (ref 25.1–34.0)
MCHC: 32.8 g/dL (ref 31.5–36.0)
MCV: 89 fL (ref 79.5–101.0)
MONO#: 0.6 10*3/uL (ref 0.1–0.9)
MONO%: 7.9 % (ref 0.0–14.0)
NEUT#: 5.3 10*3/uL (ref 1.5–6.5)
NEUT%: 65.8 % (ref 38.4–76.8)
Platelets: 175 10*3/uL (ref 145–400)
RBC: 4.38 10*6/uL (ref 3.70–5.45)
RDW: 13.6 % (ref 11.2–14.5)
WBC: 8 10*3/uL (ref 3.9–10.3)
lymph#: 2 10*3/uL (ref 0.9–3.3)
nRBC: 0 % (ref 0–0)

## 2012-07-16 MED ORDER — SODIUM CHLORIDE 0.9 % IV SOLN
Freq: Once | INTRAVENOUS | Status: AC
  Administered 2012-07-16: 12:00:00 via INTRAVENOUS

## 2012-07-16 MED ORDER — PALONOSETRON HCL INJECTION 0.25 MG/5ML
0.2500 mg | Freq: Once | INTRAVENOUS | Status: AC
  Administered 2012-07-16: 0.25 mg via INTRAVENOUS

## 2012-07-16 MED ORDER — DOXORUBICIN HCL CHEMO IV INJECTION 2 MG/ML
60.0000 mg/m2 | Freq: Once | INTRAVENOUS | Status: AC
  Administered 2012-07-16: 118 mg via INTRAVENOUS
  Filled 2012-07-16: qty 59

## 2012-07-16 MED ORDER — LORAZEPAM 2 MG/ML IJ SOLN
0.5000 mg | Freq: Once | INTRAMUSCULAR | Status: AC
  Administered 2012-07-16: 0.5 mg via INTRAVENOUS

## 2012-07-16 MED ORDER — DEXAMETHASONE SODIUM PHOSPHATE 20 MG/5ML IJ SOLN
12.0000 mg | Freq: Once | INTRAMUSCULAR | Status: AC
  Administered 2012-07-16: 12 mg via INTRAVENOUS

## 2012-07-16 MED ORDER — SODIUM CHLORIDE 0.9 % IV SOLN
150.0000 mg | Freq: Once | INTRAVENOUS | Status: AC
  Administered 2012-07-16: 150 mg via INTRAVENOUS
  Filled 2012-07-16: qty 5

## 2012-07-16 MED ORDER — HEPARIN SOD (PORK) LOCK FLUSH 100 UNIT/ML IV SOLN
500.0000 [IU] | Freq: Once | INTRAVENOUS | Status: DC | PRN
  Filled 2012-07-16: qty 5

## 2012-07-16 MED ORDER — SODIUM CHLORIDE 0.9 % IJ SOLN
10.0000 mL | INTRAMUSCULAR | Status: DC | PRN
  Filled 2012-07-16: qty 10

## 2012-07-16 MED ORDER — SODIUM CHLORIDE 0.9 % IV SOLN
600.0000 mg/m2 | Freq: Once | INTRAVENOUS | Status: AC
  Administered 2012-07-16: 1180 mg via INTRAVENOUS
  Filled 2012-07-16: qty 59

## 2012-07-16 NOTE — Patient Instructions (Addendum)
 Cancer Center Discharge Instructions for Patients Receiving Chemotherapy  Today you received the following chemotherapy agents:  Adriamycin and Cytoxan  To help prevent nausea and vomiting after your treatment, we encourage you to take your nausea medication as ordered per MD.    If you develop nausea and vomiting that is not controlled by your nausea medication, call the clinic. If it is after clinic hours your family physician or the after hours number for the clinic or go to the Emergency Department.   BELOW ARE SYMPTOMS THAT SHOULD BE REPORTED IMMEDIATELY:  *FEVER GREATER THAN 100.5 F  *CHILLS WITH OR WITHOUT FEVER  NAUSEA AND VOMITING THAT IS NOT CONTROLLED WITH YOUR NAUSEA MEDICATION  *UNUSUAL SHORTNESS OF BREATH  *UNUSUAL BRUISING OR BLEEDING  TENDERNESS IN MOUTH AND THROAT WITH OR WITHOUT PRESENCE OF ULCERS  *URINARY PROBLEMS  *BOWEL PROBLEMS  UNUSUAL RASH Items with * indicate a potential emergency and should be followed up as soon as possible.   Please let the nurse know about any problems that you may have experienced. Feel free to call the clinic you have any questions or concerns. The clinic phone number is [phone].   I have been informed and understand all the instructions given to me. I know to contact the clinic, my physician, or go to the Emergency Department if any problems should occur. I do not have any questions at this time, but understand that I may call the clinic during office hours   should I have any questions or need assistance in obtaining follow up care.    __________________________________________  _____________  __________ Signature of Patient or Authorized Representative            Date                   Time    __________________________________________ Nurse's Signature

## 2012-07-17 ENCOUNTER — Ambulatory Visit (HOSPITAL_BASED_OUTPATIENT_CLINIC_OR_DEPARTMENT_OTHER): Payer: BC Managed Care – PPO

## 2012-07-17 VITALS — BP 161/87 | HR 72 | Temp 97.0°F

## 2012-07-17 DIAGNOSIS — Z5189 Encounter for other specified aftercare: Secondary | ICD-10-CM

## 2012-07-17 DIAGNOSIS — C50119 Malignant neoplasm of central portion of unspecified female breast: Secondary | ICD-10-CM

## 2012-07-17 DIAGNOSIS — C50919 Malignant neoplasm of unspecified site of unspecified female breast: Secondary | ICD-10-CM

## 2012-07-17 MED ORDER — PEGFILGRASTIM INJECTION 6 MG/0.6ML
6.0000 mg | Freq: Once | SUBCUTANEOUS | Status: AC
  Administered 2012-07-17: 6 mg via SUBCUTANEOUS

## 2012-07-20 ENCOUNTER — Telehealth: Payer: Self-pay | Admitting: *Deleted

## 2012-07-20 NOTE — Telephone Encounter (Signed)
Patient called reporting "elevated blood sugars and feeling funny.  I'm thirsty and going to the bathroom frequently.  Fasting blood sugar this morning at 4:00 am = 200 and has been as high as 400.  All are fasting."  Reports having previously using oral hyperglycemic agent but has been able to control blood sugar with diet and exercise so medicine was stopped.  Cycle 2 of A/C was received on 07-16-2012.  Informed patient she receives dexamethasone with treatment and takes this for a few days after treatment.  Instructed to call Dr. Velna Hatchet for evaluation of diabetes management due to now being on steroids.  Will notify Dr. Welton Flakes.  Patient 's next f/u is 07-23-2012.

## 2012-07-20 NOTE — Telephone Encounter (Signed)
Sounds good

## 2012-07-23 ENCOUNTER — Other Ambulatory Visit: Payer: BC Managed Care – PPO

## 2012-07-23 ENCOUNTER — Ambulatory Visit (HOSPITAL_BASED_OUTPATIENT_CLINIC_OR_DEPARTMENT_OTHER): Payer: BC Managed Care – PPO | Admitting: Oncology

## 2012-07-23 ENCOUNTER — Other Ambulatory Visit (HOSPITAL_BASED_OUTPATIENT_CLINIC_OR_DEPARTMENT_OTHER): Payer: BC Managed Care – PPO | Admitting: Lab

## 2012-07-23 ENCOUNTER — Encounter: Payer: Self-pay | Admitting: Oncology

## 2012-07-23 VITALS — BP 146/81 | HR 94 | Temp 98.6°F | Resp 20 | Ht 64.0 in | Wt 185.9 lb

## 2012-07-23 DIAGNOSIS — R5383 Other fatigue: Secondary | ICD-10-CM

## 2012-07-23 DIAGNOSIS — D72819 Decreased white blood cell count, unspecified: Secondary | ICD-10-CM

## 2012-07-23 DIAGNOSIS — C50919 Malignant neoplasm of unspecified site of unspecified female breast: Secondary | ICD-10-CM

## 2012-07-23 DIAGNOSIS — R5381 Other malaise: Secondary | ICD-10-CM

## 2012-07-23 DIAGNOSIS — C50119 Malignant neoplasm of central portion of unspecified female breast: Secondary | ICD-10-CM

## 2012-07-23 DIAGNOSIS — C50911 Malignant neoplasm of unspecified site of right female breast: Secondary | ICD-10-CM

## 2012-07-23 LAB — COMPREHENSIVE METABOLIC PANEL (CC13)
ALT: 16 U/L (ref 0–55)
AST: 15 U/L (ref 5–34)
Albumin: 3.7 g/dL (ref 3.5–5.0)
Alkaline Phosphatase: 145 U/L (ref 40–150)
BUN: 8.6 mg/dL (ref 7.0–26.0)
CO2: 28 mEq/L (ref 22–29)
Calcium: 9.7 mg/dL (ref 8.4–10.4)
Chloride: 101 mEq/L (ref 98–107)
Creatinine: 0.7 mg/dL (ref 0.6–1.1)
Glucose: 123 mg/dl — ABNORMAL HIGH (ref 70–99)
Potassium: 4.5 mEq/L (ref 3.5–5.1)
Sodium: 140 mEq/L (ref 136–145)
Total Bilirubin: 0.62 mg/dL (ref 0.20–1.20)
Total Protein: 6.6 g/dL (ref 6.4–8.3)

## 2012-07-23 LAB — CBC WITH DIFFERENTIAL/PLATELET
BASO%: 0.9 % (ref 0.0–2.0)
Basophils Absolute: 0 10*3/uL (ref 0.0–0.1)
EOS%: 1.2 % (ref 0.0–7.0)
Eosinophils Absolute: 0 10*3/uL (ref 0.0–0.5)
HCT: 36.7 % (ref 34.8–46.6)
HGB: 12.4 g/dL (ref 11.6–15.9)
LYMPH%: 25.1 % (ref 14.0–49.7)
MCH: 29.8 pg (ref 25.1–34.0)
MCHC: 33.9 g/dL (ref 31.5–36.0)
MCV: 88.1 fL (ref 79.5–101.0)
MONO#: 0.1 10*3/uL (ref 0.1–0.9)
MONO%: 3.7 % (ref 0.0–14.0)
NEUT#: 1.6 10*3/uL (ref 1.5–6.5)
NEUT%: 69.1 % (ref 38.4–76.8)
Platelets: 237 10*3/uL (ref 145–400)
RBC: 4.17 10*6/uL (ref 3.70–5.45)
RDW: 13.7 % (ref 11.2–14.5)
WBC: 2.4 10*3/uL — ABNORMAL LOW (ref 3.9–10.3)
lymph#: 0.6 10*3/uL — ABNORMAL LOW (ref 0.9–3.3)

## 2012-07-23 NOTE — Patient Instructions (Addendum)
Doing well  I will see you back in 1 week for cycle 3 of AC

## 2012-07-28 ENCOUNTER — Other Ambulatory Visit: Payer: BC Managed Care – PPO | Admitting: Lab

## 2012-07-28 ENCOUNTER — Ambulatory Visit: Payer: BC Managed Care – PPO | Admitting: Oncology

## 2012-07-30 ENCOUNTER — Ambulatory Visit (HOSPITAL_BASED_OUTPATIENT_CLINIC_OR_DEPARTMENT_OTHER): Payer: BC Managed Care – PPO | Admitting: Oncology

## 2012-07-30 ENCOUNTER — Other Ambulatory Visit (HOSPITAL_BASED_OUTPATIENT_CLINIC_OR_DEPARTMENT_OTHER): Payer: BC Managed Care – PPO | Admitting: Lab

## 2012-07-30 ENCOUNTER — Telehealth: Payer: Self-pay | Admitting: *Deleted

## 2012-07-30 ENCOUNTER — Ambulatory Visit (HOSPITAL_BASED_OUTPATIENT_CLINIC_OR_DEPARTMENT_OTHER): Payer: BC Managed Care – PPO

## 2012-07-30 ENCOUNTER — Encounter: Payer: Self-pay | Admitting: Oncology

## 2012-07-30 ENCOUNTER — Other Ambulatory Visit: Payer: BC Managed Care – PPO | Admitting: Lab

## 2012-07-30 VITALS — BP 137/76 | HR 93 | Temp 98.5°F | Resp 20 | Ht 64.0 in | Wt 188.4 lb

## 2012-07-30 DIAGNOSIS — Z5111 Encounter for antineoplastic chemotherapy: Secondary | ICD-10-CM

## 2012-07-30 DIAGNOSIS — C50119 Malignant neoplasm of central portion of unspecified female breast: Secondary | ICD-10-CM

## 2012-07-30 DIAGNOSIS — C50919 Malignant neoplasm of unspecified site of unspecified female breast: Secondary | ICD-10-CM

## 2012-07-30 DIAGNOSIS — C50912 Malignant neoplasm of unspecified site of left female breast: Secondary | ICD-10-CM

## 2012-07-30 DIAGNOSIS — C50911 Malignant neoplasm of unspecified site of right female breast: Secondary | ICD-10-CM

## 2012-07-30 LAB — CBC WITH DIFFERENTIAL/PLATELET
BASO%: 0.5 % (ref 0.0–2.0)
Basophils Absolute: 0 10*3/uL (ref 0.0–0.1)
EOS%: 0.2 % (ref 0.0–7.0)
Eosinophils Absolute: 0 10*3/uL (ref 0.0–0.5)
HCT: 34.4 % — ABNORMAL LOW (ref 34.8–46.6)
HGB: 11.7 g/dL (ref 11.6–15.9)
LYMPH%: 14.8 % (ref 14.0–49.7)
MCH: 30.1 pg (ref 25.1–34.0)
MCHC: 34 g/dL (ref 31.5–36.0)
MCV: 88.4 fL (ref 79.5–101.0)
MONO#: 0.5 10*3/uL (ref 0.1–0.9)
MONO%: 6.7 % (ref 0.0–14.0)
NEUT#: 6.4 10*3/uL (ref 1.5–6.5)
NEUT%: 77.8 % — ABNORMAL HIGH (ref 38.4–76.8)
Platelets: 245 10*3/uL (ref 145–400)
RBC: 3.89 10*6/uL (ref 3.70–5.45)
RDW: 14.2 % (ref 11.2–14.5)
WBC: 8.3 10*3/uL (ref 3.9–10.3)
lymph#: 1.2 10*3/uL (ref 0.9–3.3)

## 2012-07-30 LAB — COMPREHENSIVE METABOLIC PANEL (CC13)
ALT: 24 U/L (ref 0–55)
AST: 19 U/L (ref 5–34)
Albumin: 3.3 g/dL — ABNORMAL LOW (ref 3.5–5.0)
Alkaline Phosphatase: 120 U/L (ref 40–150)
BUN: 6.1 mg/dL — ABNORMAL LOW (ref 7.0–26.0)
CO2: 29 mEq/L (ref 22–29)
Calcium: 9.4 mg/dL (ref 8.4–10.4)
Chloride: 104 mEq/L (ref 98–107)
Creatinine: 0.7 mg/dL (ref 0.6–1.1)
Glucose: 138 mg/dl — ABNORMAL HIGH (ref 70–99)
Potassium: 4.2 mEq/L (ref 3.5–5.1)
Sodium: 143 mEq/L (ref 136–145)
Total Bilirubin: <0.2 mg/dL (ref 0.20–1.20)
Total Protein: 6.2 g/dL — ABNORMAL LOW (ref 6.4–8.3)

## 2012-07-30 MED ORDER — PALONOSETRON HCL INJECTION 0.25 MG/5ML
0.2500 mg | Freq: Once | INTRAVENOUS | Status: AC
  Administered 2012-07-30: 0.25 mg via INTRAVENOUS

## 2012-07-30 MED ORDER — SODIUM CHLORIDE 0.9 % IV SOLN
600.0000 mg/m2 | Freq: Once | INTRAVENOUS | Status: AC
  Administered 2012-07-30: 1180 mg via INTRAVENOUS
  Filled 2012-07-30: qty 59

## 2012-07-30 MED ORDER — LORAZEPAM 2 MG/ML IJ SOLN
0.5000 mg | Freq: Once | INTRAMUSCULAR | Status: AC
  Administered 2012-07-30: 0.5 mg via INTRAVENOUS

## 2012-07-30 MED ORDER — SODIUM CHLORIDE 0.9 % IV SOLN
Freq: Once | INTRAVENOUS | Status: AC
  Administered 2012-07-30: 13:00:00 via INTRAVENOUS

## 2012-07-30 MED ORDER — DOXORUBICIN HCL CHEMO IV INJECTION 2 MG/ML
60.0000 mg/m2 | Freq: Once | INTRAVENOUS | Status: AC
  Administered 2012-07-30: 118 mg via INTRAVENOUS
  Filled 2012-07-30: qty 59

## 2012-07-30 MED ORDER — DEXAMETHASONE SODIUM PHOSPHATE 20 MG/5ML IJ SOLN
12.0000 mg | Freq: Once | INTRAMUSCULAR | Status: AC
  Administered 2012-07-30: 12 mg via INTRAVENOUS

## 2012-07-30 MED ORDER — SODIUM CHLORIDE 0.9 % IV SOLN
150.0000 mg | Freq: Once | INTRAVENOUS | Status: AC
  Administered 2012-07-30: 150 mg via INTRAVENOUS
  Filled 2012-07-30: qty 5

## 2012-07-30 MED ORDER — SODIUM CHLORIDE 0.9 % IJ SOLN
10.0000 mL | INTRAMUSCULAR | Status: DC | PRN
  Administered 2012-07-30: 10 mL
  Filled 2012-07-30: qty 10

## 2012-07-30 MED ORDER — HEPARIN SOD (PORK) LOCK FLUSH 100 UNIT/ML IV SOLN
500.0000 [IU] | Freq: Once | INTRAVENOUS | Status: AC | PRN
  Administered 2012-07-30: 500 [IU]
  Filled 2012-07-30: qty 5

## 2012-07-30 NOTE — Patient Instructions (Addendum)
[facility] Discharge Instructions for Patients Receiving Chemotherapy  Today you received the following chemotherapy agents Cytoxan and Adriamycin.  To help prevent nausea and vomiting after your treatment, we encourage you to take your nausea medication as prescribed.   If you develop nausea and vomiting that is not controlled by your nausea medication, call the clinic.   BELOW ARE SYMPTOMS THAT SHOULD BE REPORTED IMMEDIATELY:  *FEVER GREATER THAN 100.5 F  *CHILLS WITH OR WITHOUT FEVER  NAUSEA AND VOMITING THAT IS NOT CONTROLLED WITH YOUR NAUSEA MEDICATION  *UNUSUAL SHORTNESS OF BREATH  *UNUSUAL BRUISING OR BLEEDING  TENDERNESS IN MOUTH AND THROAT WITH OR WITHOUT PRESENCE OF ULCERS  *URINARY PROBLEMS  *BOWEL PROBLEMS  UNUSUAL RASH Items with * indicate a potential emergency and should be followed up as soon as possible.  Feel free to call the clinic you have any questions or concerns. The clinic phone number is [phone].

## 2012-07-30 NOTE — Telephone Encounter (Signed)
appts made and printed. Pt is aware that i staff messaged MW to adjust the tx.Marland Kitchentd

## 2012-07-30 NOTE — Patient Instructions (Addendum)
Proceed with adriamycin and cytoxan cycle 3  I would recommend starting cipro 500 mg twice a day beginning on 6/20 for 1 week  I will see you back on 6/27 for cycle 4 of chemotherapy  Please call with any problems or questions   Implanted Port Instructions An implanted port is a central line that has a round shape and is placed under the skin. It is used for long-term IV (intravenous) access for:  Medicine.  Fluids.  Liquid nutrition, such as TPN (total parenteral nutrition).  Blood samples. Ports can be placed:  In the chest area just below the collarbone (this is the most common place.)  In the arms.  In the belly (abdomen) area.  In the legs. PARTS OF THE PORT A port has 2 main parts:  The reservoir. The reservoir is round, disc-shaped, and will be a small, raised area under your skin.  The reservoir is the part where a needle is inserted (accessed) to either give medicines or to draw blood.  The catheter. The catheter is a long, slender tube that extends from the reservoir. The catheter is placed into a large vein.  Medicine that is inserted into the reservoir goes into the catheter and then into the vein. INSERTION OF THE PORT  The port is surgically placed in either an operating room or in a procedural area (interventional radiology).  Medicine may be given to help you relax during the procedure.  The skin where the port will be inserted is numbed (local anesthetic).  1 or 2 small cuts (incisions) will be made in the skin to insert the port.  The port can be used after it has been inserted. INCISION SITE CARE  The incision site may have small adhesive strips on it. This helps keep the incision site closed. Sometimes, no adhesive strips are placed. Instead of adhesive strips, a special kind of surgical glue is used to keep the incision closed.  If adhesive strips were placed on the incision sites, do not take them off. They will fall off on their own.  The  incision site may be sore for 1 to 2 days. Pain medicine can help.  Do not get the incision site wet. Bathe or shower as directed by your caregiver.  The incision site should heal in 5 to 7 days. A small scar may form after the incision has healed. ACCESSING THE PORT Special steps must be taken to access the port:  Before the port is accessed, a numbing cream can be placed on the skin. This helps numb the skin over the port site.  A sterile technique is used to access the port.  The port is accessed with a needle. Only "non-coring" port needles should be used to access the port. Once the port is accessed, a blood return should be checked. This helps ensure the port is in the vein and is not clogged (clotted).  If your caregiver believes your port should remain accessed, a clear (transparent) bandage will be placed over the needle site. The bandage and needle will need to be changed every week or as directed by your caregiver.  Keep the bandage covering the needle clean and dry. Do not get it wet. Follow your caregiver's instructions on how to take a shower or bath when the port is accessed.  If your port does not need to stay accessed, no bandage is needed over the port. FLUSHING THE PORT Flushing the port keeps it from getting clogged. How often the  port is flushed depends on:  If a constant infusion is running. If a constant infusion is running, the port may not need to be flushed.  If intermittent medicines are given.  If the port is not being used. For intermittent medicines:  The port will need to be flushed:  After medicines have been given.  After blood has been drawn.  As part of routine maintenance.  A port is normally flushed with:  Normal saline.  Heparin.  Follow your caregiver's advice on how often, how much, and the type of flush to use on your port. IMPORTANT PORT INFORMATION  Tell your caregiver if you are allergic to heparin.  After your port is  placed, you will get a manufacturer's information card. The card has information about your port. Keep this card with you at all times.  There are many types of ports available. Know what kind of port you have.  In case of an emergency, it may be helpful to wear a medical alert bracelet. This can help alert health care workers that you have a port.  The port can stay in for as long as your caregiver believes it is necessary.  When it is time for the port to come out, surgery will be done to remove it. The surgery will be similar to how the port was put in.  If you are in the hospital or clinic:  Your port will be taken care of and flushed by a nurse.  If you are at home:  A home health care nurse may give medicines and take care of the port.  You or a family member can get special training and directions for giving medicine and taking care of the port at home. SEEK IMMEDIATE MEDICAL CARE IF:   Your port does not flush or you are unable to get a blood return.  New drainage or pus is coming from the incision.  A bad smell is coming from the incision site.  You develop swelling or increased redness at the incision site.  You develop increased swelling or pain at the port site.  You develop swelling or pain in the surrounding skin near the port.  You have an oral temperature above 102 F (38.9 C), not controlled by medicine. MAKE SURE YOU:   Understand these instructions.  Will watch your condition.  Will get help right away if you are not doing well or get worse. Document Released: 02/03/2005 Document Revised: 04/28/2011 Document Reviewed: 04/27/2008 [facility] Patient Information 2014 [place], [place].  Doxorubicin injection What is this medicine? DOXORUBICIN (dox oh ROO bi sin) is a chemotherapy drug. It is used to treat many kinds of cancer like Hodgkin's disease, leukemia, non-Hodgkin's lymphoma, neuroblastoma, sarcoma, and Wilms' tumor. It is also used to treat bladder  cancer, breast cancer, lung cancer, ovarian cancer, stomach cancer, and thyroid cancer. This medicine may be used for other purposes; ask your health care provider or pharmacist if you have questions. What should I tell my health care provider before I take this medicine? They need to know if you have any of these conditions: -blood disorders -heart disease, recent heart attack -infection (especially a virus infection such as chickenpox, cold sores, or herpes) -irregular heartbeat -liver disease -recent or ongoing radiation therapy -an unusual or allergic reaction to doxorubicin, other chemotherapy agents, other medicines, foods, dyes, or preservatives -pregnant or trying to get pregnant -breast-feeding How should I use this medicine? This drug is given as an infusion into a vein. It  is administered in a hospital or clinic by a specially trained health care professional. If you have pain, swelling, burning or any unusual feeling around the site of your injection, tell your health care professional right away. Talk to your pediatrician regarding the use of this medicine in children. Special care may be needed. Overdosage: If you think you have taken too much of this medicine contact a poison control center or emergency room at once. NOTE: This medicine is only for you. Do not share this medicine with others. What if I miss a dose? It is important not to miss your dose. Call your doctor or health care professional if you are unable to keep an appointment. What may interact with this medicine? Do not take this medicine with any of the following medications: -cisapride -droperidol -halofantrine -pimozide -zidovudine This medicine may also interact with the following medications: -chloroquine -chlorpromazine -clarithromycin -cyclophosphamide -cyclosporine -erythromycin -medicines for depression, anxiety, or psychotic disturbances -medicines for irregular heart beat like amiodarone,  bepridil, dofetilide, encainide, flecainide, propafenone, quinidine -medicines for seizures like ethotoin, fosphenytoin, phenytoin -medicines for nausea, vomiting like dolasetron, ondansetron, palonosetron -medicines to increase blood counts like filgrastim, pegfilgrastim, sargramostim -methadone -methotrexate -pentamidine -progesterone -vaccines -verapamil Talk to your doctor or health care professional before taking any of these medicines: -acetaminophen -aspirin -ibuprofen -ketoprofen -naproxen This list may not describe all possible interactions. Give your health care provider a list of all the medicines, herbs, non-prescription drugs, or dietary supplements you use. Also tell them if you smoke, drink alcohol, or use illegal drugs. Some items may interact with your medicine. What should I watch for while using this medicine? Your condition will be monitored carefully while you are receiving this medicine. You will need important blood work done while you are taking this medicine. This drug may make you feel generally unwell. This is not uncommon, as chemotherapy can affect healthy cells as well as cancer cells. Report any side effects. Continue your course of treatment even though you feel ill unless your doctor tells you to stop. Your urine may turn red for a few days after your dose. This is not blood. If your urine is dark or brown, call your doctor. In some cases, you may be given additional medicines to help with side effects. Follow all directions for their use. Call your doctor or health care professional for advice if you get a fever, chills or sore throat, or other symptoms of a cold or flu. Do not treat yourself. This drug decreases your body's ability to fight infections. Try to avoid being around people who are sick. This medicine may increase your risk to bruise or bleed. Call your doctor or health care professional if you notice any unusual bleeding. Be careful brushing and  flossing your teeth or using a toothpick because you may get an infection or bleed more easily. If you have any dental work done, tell your dentist you are receiving this medicine. Avoid taking products that contain aspirin, acetaminophen, ibuprofen, naproxen, or ketoprofen unless instructed by your doctor. These medicines may hide a fever. Men and women of childbearing age should use effective birth control methods while using taking this medicine. Do not become pregnant while taking this medicine. There is a potential for serious side effects to an unborn child. Talk to your health care professional or pharmacist for more information. Do not breast-feed an infant while taking this medicine. Do not let others touch your urine or other body fluids for 5 days after each treatment  with this medicine. Caregivers should wear latex gloves to avoid touching body fluids during this time. What side effects may I notice from receiving this medicine? Side effects that you should report to your doctor or health care professional as soon as possible: -allergic reactions like skin rash, itching or hives, swelling of the face, lips, or tongue -low blood counts - this medicine may decrease the number of white blood cells, red blood cells and platelets. You may be at increased risk for infections and bleeding. -signs of infection - fever or chills, cough, sore throat, pain or difficulty passing urine -signs of decreased platelets or bleeding - bruising, pinpoint red spots on the skin, black, tarry stools, blood in the urine -signs of decreased red blood cells - unusually weak or tired, fainting spells, lightheadedness -breathing problems -chest pain -fast, irregular heartbeat -mouth sores -nausea, vomiting -pain, swelling, redness at site where injected -pain, tingling, numbness in the hands or feet -swelling of ankles, feet, or hands -unusual bleeding or bruising Side effects that usually do not require medical  attention (report to your doctor or health care professional if they continue or are bothersome): -diarrhea -facial flushing -hair loss -loss of appetite -missed menstrual periods -nail discoloration or damage -red or watery eyes -red colored urine -stomach upset This list may not describe all possible side effects. Call your doctor for medical advice about side effects. You may report side effects to FDA at 1-800-FDA-1088. Where should I keep my medicine? This drug is given in a hospital or clinic and will not be stored at home. NOTE: This sheet is a summary. It may not cover all possible information. If you have questions about this medicine, talk to your doctor, pharmacist, or health care provider.  2013, [employer]. (05/25/2007 5:07:32 PM)  Cyclophosphamide injection What is this medicine? CYCLOPHOSPHAMIDE (sye kloe FOSS fa mide) is a chemotherapy drug. It slows the growth of cancer cells. This medicine is used to treat many types of cancer like lymphoma, myeloma, leukemia, breast cancer, and ovarian cancer, to name a few. It is also used to treat nephrotic syndrome in children. This medicine may be used for other purposes; ask your health care provider or pharmacist if you have questions. What should I tell my health care provider before I take this medicine? They need to know if you have any of these conditions: -blood disorders -history of other chemotherapy -history of radiation therapy -infection -kidney disease -liver disease -tumors in the bone marrow -an unusual or allergic reaction to cyclophosphamide, other chemotherapy, other medicines, foods, dyes, or preservatives -pregnant or trying to get pregnant -breast-feeding How should I use this medicine? This drug is usually given as an injection into a vein or muscle or by infusion into a vein. It is administered in a hospital or clinic by a specially trained health care professional. Talk to your pediatrician  regarding the use of this medicine in children. While this drug may be prescribed for selected conditions, precautions do apply. Overdosage: If you think you have taken too much of this medicine contact a poison control center or emergency room at once. NOTE: This medicine is only for you. Do not share this medicine with others. What if I miss a dose? It is important not to miss your dose. Call your doctor or health care professional if you are unable to keep an appointment. What may interact with this medicine? Do not take this medicine with any of the following medications: -mibefradil -nalidixic acid This medicine  may also interact with the following medications: -doxorubicin -etanercept -medicines to increase blood counts like filgrastim, pegfilgrastim, sargramostim -medicines that block muscle or nerve pain -St. John's Wort -phenobarbital -succinylcholine chloride -trastuzumab -vaccines Talk to your doctor or health care professional before taking any of these medicines: -acetaminophen -aspirin -ibuprofen -ketoprofen -naproxen This list may not describe all possible interactions. Give your health care provider a list of all the medicines, herbs, non-prescription drugs, or dietary supplements you use. Also tell them if you smoke, drink alcohol, or use illegal drugs. Some items may interact with your medicine. What should I watch for while using this medicine? Visit your doctor for checks on your progress. This drug may make you feel generally unwell. This is not uncommon, as chemotherapy can affect healthy cells as well as cancer cells. Report any side effects. Continue your course of treatment even though you feel ill unless your doctor tells you to stop. Drink water or other fluids as directed. Urinate often, even at night. In some cases, you may be given additional medicines to help with side effects. Follow all directions for their use. Call your doctor or health care  professional for advice if you get a fever, chills or sore throat, or other symptoms of a cold or flu. Do not treat yourself. This drug decreases your body's ability to fight infections. Try to avoid being around people who are sick. This medicine may increase your risk to bruise or bleed. Call your doctor or health care professional if you notice any unusual bleeding. Be careful brushing and flossing your teeth or using a toothpick because you may get an infection or bleed more easily. If you have any dental work done, tell your dentist you are receiving this medicine. Avoid taking products that contain aspirin, acetaminophen, ibuprofen, naproxen, or ketoprofen unless instructed by your doctor. These medicines may hide a fever. Do not become pregnant while taking this medicine. Women should inform their doctor if they wish to become pregnant or think they might be pregnant. There is a potential for serious side effects to an unborn child. Talk to your health care professional or pharmacist for more information. Do not breast-feed an infant while taking this medicine. Men should inform their doctor if they wish to father a child. This medicine may lower sperm counts. If you are going to have surgery, tell your doctor or health care professional that you have taken this medicine. What side effects may I notice from receiving this medicine? Side effects that you should report to your doctor or health care professional as soon as possible: -allergic reactions like skin rash, itching or hives, swelling of the face, lips, or tongue -low blood counts - this medicine may decrease the number of white blood cells, red blood cells and platelets. You may be at increased risk for infections and bleeding. -signs of infection - fever or chills, cough, sore throat, pain or difficulty passing urine -signs of decreased platelets or bleeding - bruising, pinpoint red spots on the skin, black, tarry stools, blood in the  urine -signs of decreased red blood cells - unusually weak or tired, fainting spells, lightheadedness -breathing problems -dark urine -mouth sores -pain, swelling, redness at site where injected -swelling of the ankles, feet, hands -trouble passing urine or change in the amount of urine -weight gain -yellowing of the eyes or skin Side effects that usually do not require medical attention (report to your doctor or health care professional if they continue or are bothersome): -changes in  nail or skin color -diarrhea -hair loss -loss of appetite -missed menstrual periods -nausea, vomiting -stomach pain This list may not describe all possible side effects. Call your doctor for medical advice about side effects. You may report side effects to FDA at 1-800-FDA-1088. Where should I keep my medicine? This drug is given in a hospital or clinic and will not be stored at home. NOTE: This sheet is a summary. It may not cover all possible information. If you have questions about this medicine, talk to your doctor, pharmacist, or health care provider.  2013, [employer] Standard. (05/11/2007 2:32:25 PM)   Pegfilgrastim injection What is this medicine? PEGFILGRASTIM (peg fil GRA stim) helps the body make more white blood cells. It is used to prevent infection in people with low amounts of white blood cells following cancer treatment. This medicine may be used for other purposes; ask your health care provider or pharmacist if you have questions. What should I tell my health care provider before I take this medicine? They need to know if you have any of these conditions: -sickle cell disease -an unusual or allergic reaction to pegfilgrastim, filgrastim, E.coli protein, other medicines, foods, dyes, or preservatives -pregnant or trying to get pregnant -breast-feeding How should I use this medicine? This medicine is for injection under the skin. It is usually given by a health care professional in  a hospital or clinic setting. If you get this medicine at home, you will be taught how to prepare and give this medicine. Do not shake this medicine. Use exactly as directed. Take your medicine at regular intervals. Do not take your medicine more often than directed. It is important that you put your used needles and syringes in a special sharps container. Do not put them in a trash can. If you do not have a sharps container, call your pharmacist or healthcare provider to get one. Talk to your pediatrician regarding the use of this medicine in children. While this drug may be prescribed for children who weigh more than 45 kg for selected conditions, precautions do apply Overdosage: If you think you have taken too much of this medicine contact a poison control center or emergency room at once. NOTE: This medicine is only for you. Do not share this medicine with others. What if I miss a dose? If you miss a dose, take it as soon as you can. If it is almost time for your next dose, take only that dose. Do not take double or extra doses. What may interact with this medicine? -lithium -medicines for growth therapy This list may not describe all possible interactions. Give your health care provider a list of all the medicines, herbs, non-prescription drugs, or dietary supplements you use. Also tell them if you smoke, drink alcohol, or use illegal drugs. Some items may interact with your medicine. What should I watch for while using this medicine? Visit your doctor for regular check ups. You will need important blood work done while you are taking this medicine. What side effects may I notice from receiving this medicine? Side effects that you should report to your doctor or health care professional as soon as possible: -allergic reactions like skin rash, itching or hives, swelling of the face, lips, or tongue -breathing problems -fever -pain, redness, or swelling where injected -shoulder pain -stomach  or side pain Side effects that usually do not require medical attention (report to your doctor or health care professional if they continue or are bothersome): -aches, pains -headache -loss  of appetite -nausea, vomiting -unusually tired This list may not describe all possible side effects. Call your doctor for medical advice about side effects. You may report side effects to FDA at 1-800-FDA-1088. Where should I keep my medicine? Keep out of the reach of children. Store in a refrigerator between 2 and 8 degrees C (36 and 46 degrees F). Do not freeze. Keep in carton to protect from light. Throw away this medicine if it is left out of the refrigerator for more than 48 hours. Throw away any unused medicine after the expiration date. NOTE: This sheet is a summary. It may not cover all possible information. If you have questions about this medicine, talk to your doctor, pharmacist, or health care provider.  2013, [employer] Standard. (09/06/2007 3:41:44 PM)    Paclitaxel injection What is this medicine? PACLITAXEL (PAK li TAX el) is a chemotherapy drug. It targets fast dividing cells, like cancer cells, and causes these cells to die. This medicine is used to treat ovarian cancer, breast cancer, and other cancers. This medicine may be used for other purposes; ask your health care provider or pharmacist if you have questions. What should I tell my health care provider before I take this medicine? They need to know if you have any of these conditions: -blood disorders -irregular heartbeat -infection (especially a virus infection such as chickenpox, cold sores, or herpes) -liver disease -previous or ongoing radiation therapy -an unusual or allergic reaction to paclitaxel, alcohol, polyoxyethylated castor oil, other chemotherapy agents, other medicines, foods, dyes, or preservatives -pregnant or trying to get pregnant -breast-feeding How should I use this medicine? This drug is given as an  infusion into a vein. It is administered in a hospital or clinic by a specially trained health care professional. Talk to your pediatrician regarding the use of this medicine in children. Special care may be needed. Overdosage: If you think you have taken too much of this medicine contact a poison control center or emergency room at once. NOTE: This medicine is only for you. Do not share this medicine with others. What if I miss a dose? It is important not to miss your dose. Call your doctor or health care professional if you are unable to keep an appointment. What may interact with this medicine? Do not take this medicine with any of the following medications: -disulfiram -metronidazole This medicine may also interact with the following medications: -cyclosporine -dexamethasone -diazepam -ketoconazole -medicines to increase blood counts like filgrastim, pegfilgrastim, sargramostim -other chemotherapy drugs like cisplatin, doxorubicin, epirubicin, etoposide, teniposide, vincristine -quinidine -testosterone -vaccines -verapamil Talk to your doctor or health care professional before taking any of these medicines: -acetaminophen -aspirin -ibuprofen -ketoprofen -naproxen This list may not describe all possible interactions. Give your health care provider a list of all the medicines, herbs, non-prescription drugs, or dietary supplements you use. Also tell them if you smoke, drink alcohol, or use illegal drugs. Some items may interact with your medicine. What should I watch for while using this medicine? Your condition will be monitored carefully while you are receiving this medicine. You will need important blood work done while you are taking this medicine. This drug may make you feel generally unwell. This is not uncommon, as chemotherapy can affect healthy cells as well as cancer cells. Report any side effects. Continue your course of treatment even though you feel ill unless your doctor  tells you to stop. In some cases, you may be given additional medicines to help with side effects. Follow all  directions for their use. Call your doctor or health care professional for advice if you get a fever, chills or sore throat, or other symptoms of a cold or flu. Do not treat yourself. This drug decreases your body's ability to fight infections. Try to avoid being around people who are sick. This medicine may increase your risk to bruise or bleed. Call your doctor or health care professional if you notice any unusual bleeding. Be careful brushing and flossing your teeth or using a toothpick because you may get an infection or bleed more easily. If you have any dental work done, tell your dentist you are receiving this medicine. Avoid taking products that contain aspirin, acetaminophen, ibuprofen, naproxen, or ketoprofen unless instructed by your doctor. These medicines may hide a fever. Do not become pregnant while taking this medicine. Women should inform their doctor if they wish to become pregnant or think they might be pregnant. There is a potential for serious side effects to an unborn child. Talk to your health care professional or pharmacist for more information. Do not breast-feed an infant while taking this medicine. Men are advised not to father a child while receiving this medicine. What side effects may I notice from receiving this medicine? Side effects that you should report to your doctor or health care professional as soon as possible: -allergic reactions like skin rash, itching or hives, swelling of the face, lips, or tongue -low blood counts - This drug may decrease the number of white blood cells, red blood cells and platelets. You may be at increased risk for infections and bleeding. -signs of infection - fever or chills, cough, sore throat, pain or difficulty passing urine -signs of decreased platelets or bleeding - bruising, pinpoint red spots on the skin, black, tarry  stools, nosebleeds -signs of decreased red blood cells - unusually weak or tired, fainting spells, lightheadedness -breathing problems -chest pain -high or low blood pressure -mouth sores -nausea and vomiting -pain, swelling, redness or irritation at the injection site -pain, tingling, numbness in the hands or feet -slow or irregular heartbeat -swelling of the ankle, feet, hands Side effects that usually do not require medical attention (report to your doctor or health care professional if they continue or are bothersome): -bone pain -complete hair loss including hair on your head, underarms, pubic hair, eyebrows, and eyelashes -changes in the color of fingernails -diarrhea -loosening of the fingernails -loss of appetite -muscle or joint pain -red flush to skin -sweating This list may not describe all possible side effects. Call your doctor for medical advice about side effects. You may report side effects to FDA at 1-800-FDA-1088. Where should I keep my medicine? This drug is given in a hospital or clinic and will not be stored at home. NOTE: This sheet is a summary. It may not cover all possible information. If you have questions about this medicine, talk to your doctor, pharmacist, or health care provider.  2013, [employer]. (01/17/2008 11:54:26 AM)  Carboplatin injection What is this medicine? CARBOPLATIN (KAR boe pla tin) is a chemotherapy drug. It targets fast dividing cells, like cancer cells, and causes these cells to die. This medicine is used to treat ovarian cancer and many other cancers. This medicine may be used for other purposes; ask your health care provider or pharmacist if you have questions. What should I tell my health care provider before I take this medicine? They need to know if you have any of these conditions: -blood disorders -hearing problems -kidney disease -  recent or ongoing radiation therapy -an unusual or allergic reaction to  carboplatin, cisplatin, other chemotherapy, other medicines, foods, dyes, or preservatives -pregnant or trying to get pregnant -breast-feeding How should I use this medicine? This drug is usually given as an infusion into a vein. It is administered in a hospital or clinic by a specially trained health care professional. Talk to your pediatrician regarding the use of this medicine in children. Special care may be needed. Overdosage: If you think you have taken too much of this medicine contact a poison control center or emergency room at once. NOTE: This medicine is only for you. Do not share this medicine with others. What if I miss a dose? It is important not to miss a dose. Call your doctor or health care professional if you are unable to keep an appointment. What may interact with this medicine? -medicines for seizures -medicines to increase blood counts like filgrastim, pegfilgrastim, sargramostim -some antibiotics like amikacin, gentamicin, neomycin, streptomycin, tobramycin -vaccines Talk to your doctor or health care professional before taking any of these medicines: -acetaminophen -aspirin -ibuprofen -ketoprofen -naproxen This list may not describe all possible interactions. Give your health care provider a list of all the medicines, herbs, non-prescription drugs, or dietary supplements you use. Also tell them if you smoke, drink alcohol, or use illegal drugs. Some items may interact with your medicine. What should I watch for while using this medicine? Your condition will be monitored carefully while you are receiving this medicine. You will need important blood work done while you are taking this medicine. This drug may make you feel generally unwell. This is not uncommon, as chemotherapy can affect healthy cells as well as cancer cells. Report any side effects. Continue your course of treatment even though you feel ill unless your doctor tells you to stop. In some cases, you may  be given additional medicines to help with side effects. Follow all directions for their use. Call your doctor or health care professional for advice if you get a fever, chills or sore throat, or other symptoms of a cold or flu. Do not treat yourself. This drug decreases your body's ability to fight infections. Try to avoid being around people who are sick. This medicine may increase your risk to bruise or bleed. Call your doctor or health care professional if you notice any unusual bleeding. Be careful brushing and flossing your teeth or using a toothpick because you may get an infection or bleed more easily. If you have any dental work done, tell your dentist you are receiving this medicine. Avoid taking products that contain aspirin, acetaminophen, ibuprofen, naproxen, or ketoprofen unless instructed by your doctor. These medicines may hide a fever. Do not become pregnant while taking this medicine. Women should inform their doctor if they wish to become pregnant or think they might be pregnant. There is a potential for serious side effects to an unborn child. Talk to your health care professional or pharmacist for more information. Do not breast-feed an infant while taking this medicine. What side effects may I notice from receiving this medicine? Side effects that you should report to your doctor or health care professional as soon as possible: -allergic reactions like skin rash, itching or hives, swelling of the face, lips, or tongue -signs of infection - fever or chills, cough, sore throat, pain or difficulty passing urine -signs of decreased platelets or bleeding - bruising, pinpoint red spots on the skin, black, tarry stools, nosebleeds -signs of decreased red blood  cells - unusually weak or tired, fainting spells, lightheadedness -breathing problems -changes in hearing -changes in vision -chest pain -high blood pressure -low blood counts - This drug may decrease the number of white blood  cells, red blood cells and platelets. You may be at increased risk for infections and bleeding. -nausea and vomiting -pain, swelling, redness or irritation at the injection site -pain, tingling, numbness in the hands or feet -problems with balance, talking, walking -trouble passing urine or change in the amount of urine Side effects that usually do not require medical attention (report to your doctor or health care professional if they continue or are bothersome): -hair loss -loss of appetite -metallic taste in the mouth or changes in taste This list may not describe all possible side effects. Call your doctor for medical advice about side effects. You may report side effects to FDA at 1-800-FDA-1088. Where should I keep my medicine? This drug is given in a hospital or clinic and will not be stored at home. NOTE: This sheet is a summary. It may not cover all possible information. If you have questions about this medicine, talk to your doctor, pharmacist, or health care provider.  2012, [employer]. (05/11/2007 2:38:05 PM)

## 2012-07-30 NOTE — Progress Notes (Signed)
OFFICE PROGRESS NOTE  CC  [name], MD [address] [place] [address] Dr. 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 Patient is status post bilateral mastectomy with immediate reconstruction  STAGE:  Right breast (mastectomy)  T1cN0 1.2 cm ER-/PR-/her-/ki-67 92% 0/1 nodes positive  Left breast (mastectomy with snl) T1cN0 1.4 cm, 1.2 cm, 0.1c ER 100%, PR 11%, her2neu positive 6.50, Ki-67 80%   PRIOR THERAPY:  #1screening mammogram performed that showed 2 areas of concern on the left side.calcifications were noted in the left. In the right breast a possible mass warranted further evaluation.   #2 On 03/29/2012 patient underwent a bilateral diagnostic mammogram and right breast ultrasound. A spot magnification images demonstrate suspicious group of pleomorphic microcalcifications over the outer lower left breast with additional suspicious group of pleomorphic microcalcifications over the outer midportion of the left periareolar region. Spot compression images of the right breast demonstrate persistence of a 1 cm density at the edge of the film in the deep third of the right inner breast.   #3Ultrasound performed showed no focal abnormality over the entire in her right breast to correspond to the mammographic density. Patient was recommended stereotactic core needle biopsy of the 2 groups of suspicious left breast microcalcifications. Because patient and her husband wanted bilateral mastectomies MRI was not performed.on 04/15/2012 patient had needle core biopsy performed of the 2 areas in the left breast. The left needle core biopsy in the lower breast revealed ductal carcinoma in situ with associated comedo necrosis and calcifications with the in situ carcinoma. It was ER +100% PR +12%. 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 Tumor was ER +100% PR +11% proliferation marker Ki-67 elevated at 80% HER-2/neu showed amplification with a ratio of 6.50.   #4 patient is now status post bilateral mastectomies performed on 05/14/2012 with immediate reconstruction. The final pathology revealed bilateral breast cancers. In the right breast tissue is noted to have a 1.2 cm invasive ductal carcinoma high-grade ER negative PR negative HER-2/neu negative with Ki-67 92% one node was negative for metastatic disease but this was not the sentinel lymph node. On the left side the patient had multifocal disease measuring 1.4 cm, 1.2 cm, 0.1 cm. Tumor was ER +100% PR +11% HER-2/neu positive with a ratio 6.50. Ki-67 was 80% 8 sentinel nodes were negative for metastatic disease.  #5 patient has had immediate reconstruction with expanders bilaterally.  #6 patient will now begin adjuvant chemotherapy initially consisting of Adriamycin Cytoxan every 2 weeks for a total of 4 cycles. This will then be followed by Taxol and Herceptin to be given weekly for 12 weeks time.   CURRENT THERAPY: proceed with cycle 3 AC day1  INTERVAL HISTORY: [name] [name] 62 y.o. female returns for followup visit. Overall she is doing well. She has so far tolerated chemotherapy well. She does develop neutropenia. She however denies having any fevers or chills or nausea or vomiting. No headaches or aches or pains. Patient has no peripheral paresthesias. Remainder of the 10 point review of systems.   MEDICAL HISTORY: Past Medical History  Diagnosis Date  . Heart murmur   . Diabetes mellitus without complication   . Osteopenia   . Cancer     breast  . Bronchitis   .  Pneumonia   . Hiatal hernia   . Sinus problem   . Diabetes mellitus   . Breast cancer 04/15/12    left-biopsy  . Breast cancer, right breast 05/14/12    right mastectomy    ALLERGIES:  is  allergic to darvon and penicillins.  MEDICATIONS:  Current Outpatient Prescriptions  Medication Sig Dispense Refill  . Ascorbic Acid (VITAMIN C) 500 MG tablet Take 500 mg by mouth daily.        . Biotin 10 MG TABS Take by mouth. 4000 mcg daily      . calcitonin, salmon, (MIACALCIN/FORTICAL) 200 UNIT/ACT nasal spray Place 1 spray into the nose daily.      . Calcium Citrate (CITRACAL PO) Take by mouth.      . Cholecalciferol (VITAMIN D PO) Take 1 tablet by mouth daily.      . cyclobenzaprine (FLEXERIL) 10 MG tablet Take 10 mg by mouth 3 (three) times daily as needed for muscle spasms.      Marland Kitchen docusate sodium 100 MG CAPS Take 100 mg by mouth 2 (two) times daily.  30 capsule  1  . KRILL OIL PO Take by mouth.      . lidocaine-prilocaine (EMLA) cream Apply topically as needed.  30 g  1  . Multiple Vitamin (MULTIVITAMIN PO) Take 1 tablet by mouth daily.       . pantoprazole (PROTONIX) 20 MG tablet Take 1 tablet (20 mg total) by mouth daily.  30 tablet  7  . pantoprazole (PROTONIX) 40 MG tablet Take 40 mg by mouth daily.        . Probiotic Product (PROBIOTIC DAILY PO) Take by mouth daily.      . rosuvastatin (CRESTOR) 5 MG tablet Take 5 mg by mouth every Monday, Wednesday, and Friday.      . sitaGLIPtin (JANUVIA) 100 MG tablet Take 100 mg by mouth daily.      . ciprofloxacin (CIPRO) 500 MG tablet Take 1 tablet (500 mg total) by mouth 2 (two) times daily.  14 tablet  6  . dexamethasone (DECADRON) 4 MG tablet Take 2 tablets by mouth once a day on the day after chemotherapy and then take 2 tablets two times a day for 2 days. Take with food.  30 tablet  1  . LORazepam (ATIVAN) 0.5 MG tablet Take 1 tablet (0.5 mg total) by mouth every 6 (six) hours as needed (Nausea or vomiting).  30 tablet  0  . ondansetron (ZOFRAN) 8 MG tablet Take 1 tablet (8 mg total) by mouth 2 (two) times daily as needed. Take two times a day as needed for nausea or vomiting starting on the third day after chemotherapy.  30 tablet  1   . prochlorperazine (COMPAZINE) 10 MG tablet Take 1 tablet (10 mg total) by mouth every 6 (six) hours as needed (Nausea or vomiting).  30 tablet  1  . prochlorperazine (COMPAZINE) 25 MG suppository Place 1 suppository (25 mg total) rectally every 12 (twelve) hours as needed for nausea.  12 suppository  3  . UNABLE TO FIND Apply 1 Device topically once. Cranial prosthesis r/t chemotherapy treatment  1 Device  0   No current facility-administered medications for this visit.    SURGICAL HISTORY:  Past Surgical History  Procedure Laterality Date  . Cesarean section  1981, 1983  . Tummy tuck  1999  . Tonsillectomy    . Foot surgery    . Gastric bypass    . Wound debridement  01/14/2011    Procedure: DEBRIDEMENT ABDOMINAL WOUND;  Surgeon: Ernestene Mention, MD;  Location: [facility];  Service: General;  Laterality: N/A;  wound debridement and debridement on the abdomen  . Cosmetic surgery    . Total mastectomy Bilateral 05/14/2012    Procedure: TOTAL MASTECTOMY;  Surgeon: Ernestene Mention, MD;  Location: [facility] OR;  Service: General;  Laterality: Bilateral;  . Axillary sentinel node biopsy Left 05/14/2012    Procedure: AXILLARY SENTINEL NODE BIOPSY;  Surgeon: Ernestene Mention, MD;  Location: [facility] OR;  Service: General;  Laterality: Left;  . Portacath placement N/A 05/14/2012    Procedure: INSERTION PORT-A-CATH;  Surgeon: Ernestene Mention, MD;  Location: [facility] OR;  Service: General;  Laterality: N/A;  . Breast reconstruction with placement of tissue expander and flex hd (acellular hydrated dermis) Bilateral 05/14/2012    Procedure: BREAST RECONSTRUCTION WITH PLACEMENT OF TISSUE EXPANDER AND FLEX HD (ACELLULAR HYDRATED DERMIS);  Surgeon: Etter Sjogren, MD;  Location: [facility] OR;  Service: Plastics;  Laterality: Bilateral;  . Bunionectomy      REVIEW OF SYSTEMS:  Pertinent items are noted in HPI.   HEALTH MAINTENANCE:   PHYSICAL EXAMINATION: Blood pressure 137/76, pulse 93, temperature 98.5 F  (36.9 C), temperature source Oral, resp. rate 20, height 5\' 4"  (1.626 m), weight 188 lb 6.4 oz (85.458 kg). Body mass index is 32.32 kg/(m^2). ECOG PERFORMANCE STATUS: 0 - Asymptomatic Well-developed well-nourished very pleasant female in no acute distress HEENT exam EOMI PERRLA sclerae anicteric no conjunctival pallor oral mucosa is moist neck is supple lungs are clear cardiovascular regular rate rhythm abdomen soft nontender no HSM extremities no edema neuro is nonfocal Bilateral expanders in place mild tenderness     LABORATORY DATA: Lab Results  Component Value Date   WBC 8.3 07/30/2012   HGB 11.7 07/30/2012   HCT 34.4* 07/30/2012   MCV 88.4 07/30/2012   PLT 245 07/30/2012      Chemistry      Component Value Date/Time   NA 143 07/30/2012 0955   NA 139 05/17/2012 0530   K 4.2 07/30/2012 0955   K 3.7 05/17/2012 0530   CL 104 07/30/2012 0955   CL 100 05/17/2012 0530   CO2 29 07/30/2012 0955   CO2 33* 05/17/2012 0530   BUN 6.1* 07/30/2012 0955   BUN 4* 05/17/2012 0530   CREATININE 0.7 07/30/2012 0955   CREATININE 0.56 05/17/2012 0530      Component Value Date/Time   CALCIUM 9.4 07/30/2012 0955   CALCIUM 8.7 05/17/2012 0530   ALKPHOS 120 07/30/2012 0955   ALKPHOS 103 05/10/2012 1440   AST 19 07/30/2012 0955   AST 25 05/10/2012 1440   ALT 24 07/30/2012 0955   ALT 17 05/10/2012 1440   BILITOT <0.20 Repeated and Verified 07/30/2012 0955   BILITOT 0.4 05/10/2012 1440    ADDITIONAL INFORMATION: 6. PROGNOSTIC INDICATORS - ACIS Results: IMMUNOHISTOCHEMICAL AND MORPHOMETRIC ANALYSIS BY THE AUTOMATED CELLULAR IMAGING SYSTEM (ACIS) Estrogen Receptor: 0%, NEGATIVE Progesterone Receptor: 0%, NEGATIVE Proliferation Marker Ki67: 92% COMMENT: The negative hormone receptor study(ies) in this case have an internal positive control. REFERENCE RANGE ESTROGEN RECEPTOR NEGATIVE <1% POSITIVE =>1% PROGESTERONE RECEPTOR NEGATIVE <1% POSITIVE =>1% All controls stained appropriately Pecola Leisure  MD Pathologist, Electronic Signature ( Signed 05/21/2012) 6. CHROMOGENIC IN-SITU HYBRIDIZATION Results: HER-2/NEU BY CISH - NO AMPLIFICATION OF HER-2 DETECTED. 1 of 6 Duplicate copy FINAL for Seeman, [name] G [phone]) ADDITIONAL INFORMATION:(continued) RESULT RATIO OF HER2: CEP 17 SIGNALS 1.19 AVERAGE HER2  COPY NUMBER PER CELL 1.85 REFERENCE RANGE NEGATIVE HER2/Chr17 Ratio <2.0 and Average HER2 copy number <4.0 EQUIVOCAL HER2/Chr17 Ratio <2.0 and Average HER2 copy number 4.0 and <6.0 POSITIVE HER2/Chr17 Ratio >=2.0 and/or Average HER2 copy number >=6.0 Pecola Leisure MD Pathologist, Electronic Signature ( Signed 05/20/2012) FINAL DIAGNOSIS Diagnosis 1. Lymph node, sentinel, biopsy, Left axillary - THERE IS NO EVIDENCE OF CARCINOMA IN 1 OF 1 LYMPH NODE (0/1). 2. Lymph node, sentinel, biopsy, Left axillary - THERE IS NO EVIDENCE OF CARCINOMA IN 1 OF 1 LYMPH NODE (0/1). 3. Lymph node, sentinel, biopsy, Left axillary - THERE IS NO EVIDENCE OF CARCINOMA IN 1 OF 1 LYMPH NODE (0/1). 4. Lymph node, sentinel, biopsy, Left axillary - THERE IS NO EVIDENCE OF CARCINOMA IN 1 OF 1 LYMPH NODE (0/1). 5. Lymph node, sentinel, biopsy, Left axillary - THERE IS NO EVIDENCE OF CARCINOMA IN 1 OF 1 LYMPH NODE (0/1). 6. Breast, simple mastectomy, Right - INVASIVE DUCTAL CARCINOMA, GRADE III/III, SPANNING 1.2 CM. - DUCTAL CARCINOMA IN SITU, HIGH GRADE. - LYMPHOVASCULAR INVASION IS IDENTIFIED. - THERE IS NO EVIDENCE OF CARCINOMA IN 1 OF 1 LYMPH NODE (0/1). - SEE ONCOLOGY TABLE BELOW. 7. Breast, simple mastectomy, Left - INVASIVE DUCTAL CARCINOMA, AT LEAST THREE FOCI, GRADE II/III, SPANNING 1.4 CM, 1.2 CM, AND 0.1 CM. - DUCTAL CARCINOMA IN SITU, INTERMEDIATE GRADE. - LYMPHOVASCULAR INVASION IS IDENTIFIED. - DUCTAL CARCINOMA IN SITU IS FOCALLY 0.2 CM TO THE ANTERIOR/INFERIOR SOFT TISSUE RESECTION MARGIN. - LOBULAR NEOPLASIA (LOBULAR CARCINOMA IN SITU). - THERE IS NO EVIDENCE OF CARCINOMA IN 2 OF  2 LYMPH NODES (0/2). - SEE ONCOLOGY TABLE BELOW. 8. Lymph node, biopsy, Left axillary - THERE IS NO EVIDENCE OF CARCINOMA IN 1 OF 1 LYMPH NODE (0/1). Microscopic Comment 6. BREAST, INVASIVE TUMOR, WITH LYMPH NODE SAMPLING Specimen, including laterality: Right breast Procedure: Simple mastectomy Grade: 3 2 of 6 Duplicate copy FINAL for Leatham, [name] G ([id number]) Microscopic Comment(continued) Tubule formation: 3 Nuclear pleomorphism: 3 Mitotic:2 Tumor size (gross measurement): 1.2 cm Margins: Invasive, distance to closest margin: 1.3 cm to the deep margin (gross measurement) In-situ, distance to closest margin: 1.3 cm to the deep margin (gross measurement) Lymphovascular invasion: Present Ductal carcinoma in situ: Present Grade: High grade Extensive intraductal component: Not identified Lobular neoplasia: Not identified in specimen #6 Tumor focality: Unifocal Treatment effect: N/A Extent of tumor: Confined to breast parenchyma Lymph nodes: # examined: 1 Lymph nodes with metastasis: 0 Breast prognostic profile: Will be performed on the current case and the results reported separately. TNM: pT1c, pN0 7. BREAST, INVASIVE TUMOR, WITH LYMPH NODE SAMPLING Specimen, including laterality: Left breast Procedure: Simple mastectomy Grade: 2 Tubule formation: 3 Nuclear pleomorphism: 2 Mitotic:2 Tumor size (gross measurements) 1.4 cm, 1.2 cm, and 0.1 cm Margins: Invasive, distance to closest margin: 0.7 cm to the anterior/inferior soft tissue resection margin In-situ, distance to closest margin: Focally 0.2 cm to the anterior/inferior soft tissue resection margin (glass slide measurement) Lymphovascular invasion: Present Ductal carcinoma in situ: Present Grade: Intermediate grade Extensive intraductal component: Yes Lobular neoplasia: Yes (lobular carcinoma in situ) Tumor focality: Multiple foci Treatment effect: N/A Extent of tumor: Confined to breast parenchyma Lymph  nodes: # examined: 8 Lymph nodes with metastasis: 0 Breast prognostic profile: [phone] Estrogen receptor: 100% strong staining intensity Progesterone receptor: 11% strong staining intensity Her 2 neu: Amplification was detected. The ratio was 6.50. 3 of 6 Duplicate copy FINAL for Belflower, [name] G ([id number]) Microscopic Comment(continued) Ki-67: 80% TNM: mpT1c, pN0 Comments: In addition to  the two grossly identified nodules, there is invasive carcinoma present in random tissue submitted from the inferior lateral quadrant. The three foci of carcinoma are morphologically similar.   RADIOGRAPHIC STUDIES:  Nm Pet Image Initial (pi) Skull Base To Thigh  05/12/2012  *RADIOLOGY REPORT*  Clinical Data: Initial treatment strategy for breast cancer.   NUCLEAR MEDICINE PET SKULL BASE TO THIGH  Fasting Blood Glucose:  143  Technique:  20 mCi F-18 FDG was injected intravenously. CT data was obtained and used for attenuation correction and anatomic localization only.  (This was not acquired as a diagnostic CT examination.) Additional exam technical data entered on technologist worksheet.  Comparison:  none  Findings:  Neck: No hypermetabolic lymph nodes in the neck.  Chest:  In the inferior medial aspect of the right breast there is a 10 mm nodule (image 99) with moderate  metabolic activity ( SUV max = 2.9).  No hypermetabolic mediastinal or hilar lymph nodes.  There is hypermetabolic activity in the thoracic inlet which is felt to be vascular in nature.  There is activity adjacent to the thoracic aorta which is also felt to be vascular may relate to the hemiazygos vein.  Abdomen/Pelvis:  No abnormal hypermetabolic activity within the liver, pancreas, adrenal glands, or spleen.  No hypermetabolic lymph nodes in the abdomen or pelvis.  Skeleton:  No focal hypermetabolic activity to suggest skeletal metastasis.  IMPRESSION:  1.  No evidence of distant breast cancer metastasis. 2.  Hypermetabolic  nodule within the inferior medial right breast   Original Report Authenticated By: Genevive Bi, M.D.    Nm Sentinel Node Inj-no Rpt (breast)  05/14/2012  CLINICAL DATA: cancer left breast   Sulfur colloid was injected intradermally by the nuclear medicine  technologist for breast cancer sentinel node localization.     Dg Chest Port 1 View  05/14/2012  *RADIOLOGY REPORT*  Clinical Data: Status post Port-A-Cath placement  PORTABLE CHEST - 1 VIEW  Comparison: None.  Findings: The cardiac shadow is within normal limits.  Bilateral tissue expanders as well as surgical drains are identified.  A right chest wall port is seen. The catheter tip is at the cavoatrial junction.  No pneumothorax is noted.  The lungs are clear bilaterally.  IMPRESSION: No evidence of pneumothorax following port placement.  Bilateral tissue expanders.   Original Report Authenticated By: Alcide Clever, M.D.    Dg Fluoro Guide Cv Line-no Report  05/14/2012  CLINICAL DATA: LEFT BREAST CANCER/PORT-A-CATH   FLOURO GUIDE CV LINE  Fluoroscopy was utilized by the requesting physician.  No radiographic  interpretation.      ASSESSMENT: 62 year old female with  #1 bilateral breast cancers on the right side she has triple-negative disease stage I left side stage I ER positive PR positive HER-2/neu positive disease. Patient is status post bilateral mastectomies. Of note on the right side the lymph node removed was not a sentinel lymph node. It is unclear what to do with this side in terms of whether or not to do further sentinel lymph node. Clinically patient seems to be doing well.  #2 we discussed her pathology in detail. We discussed chemotherapy to be given adjuvantly. We discussed Adriamycin Cytoxan given every 2 weeks for a total of 4 cycles followed by Taxol and Herceptin given weekly x12 weeks followed by Herceptin every 3 weeks to finish out a total of one year of therapy. We also discussed role of adjuvant hormonal therapy since  the left side is ER positive.  #3 patient  is now here (5/16) to begin cycle 1 day 1 of Adriamycin and Cytoxan to be given every 2 weeks for a total of 4 cycles   PLAN:   #1 proceed with cycle 3 day 1 of Adriamycin and Cytoxan. She will return in 1 day for neulasta  #2 I will see her back in one week's time for lab in followup   All questions were answered. The patient knows to call the clinic with any problems, questions or concerns. We can certainly see the patient much sooner if necessary.  I spent 25 minutes counseling the patient face to face. The total time spent in the appointment was 30 minutes.    Drue Second, MD Medical/Oncology [facility] [phone] (beeper) [phone] (Office)  07/30/2012, 12:29 PM

## 2012-07-30 NOTE — Telephone Encounter (Signed)
Per staff message and POF I have adjusted appts. JMW

## 2012-07-31 ENCOUNTER — Ambulatory Visit (HOSPITAL_BASED_OUTPATIENT_CLINIC_OR_DEPARTMENT_OTHER): Payer: BC Managed Care – PPO

## 2012-07-31 VITALS — BP 135/78 | HR 93 | Temp 97.3°F

## 2012-07-31 DIAGNOSIS — C50119 Malignant neoplasm of central portion of unspecified female breast: Secondary | ICD-10-CM

## 2012-07-31 DIAGNOSIS — C50919 Malignant neoplasm of unspecified site of unspecified female breast: Secondary | ICD-10-CM

## 2012-07-31 DIAGNOSIS — Z5189 Encounter for other specified aftercare: Secondary | ICD-10-CM

## 2012-07-31 MED ORDER — PEGFILGRASTIM INJECTION 6 MG/0.6ML
6.0000 mg | Freq: Once | SUBCUTANEOUS | Status: AC
  Administered 2012-07-31: 6 mg via SUBCUTANEOUS

## 2012-08-06 ENCOUNTER — Ambulatory Visit: Payer: BC Managed Care – PPO | Admitting: Oncology

## 2012-08-06 ENCOUNTER — Other Ambulatory Visit: Payer: BC Managed Care – PPO | Admitting: Lab

## 2012-08-08 NOTE — Progress Notes (Signed)
OFFICE PROGRESS NOTE  CC  [name], MD [address] [place] [address] Dr. 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 Patient is status post bilateral mastectomy with immediate reconstruction  STAGE:  Right breast (mastectomy)  T1cN0 1.2 cm ER-/PR-/her-/ki-67 92% 0/1 nodes positive  Left breast (mastectomy with snl) T1cN0 1.4 cm, 1.2 cm, 0.1c ER 100%, PR 11%, her2neu positive 6.50, Ki-67 80%   PRIOR THERAPY:  #1screening mammogram performed that showed 2 areas of concern on the left side.calcifications were noted in the left. In the right breast a possible mass warranted further evaluation.   #2 On 03/29/2012 patient underwent a bilateral diagnostic mammogram and right breast ultrasound. A spot magnification images demonstrate suspicious group of pleomorphic microcalcifications over the outer lower left breast with additional suspicious group of pleomorphic microcalcifications over the outer midportion of the left periareolar region. Spot compression images of the right breast demonstrate persistence of a 1 cm density at the edge of the film in the deep third of the right inner breast.   #3Ultrasound performed showed no focal abnormality over the entire in her right breast to correspond to the mammographic density. Patient was recommended stereotactic core needle biopsy of the 2 groups of suspicious left breast microcalcifications. Because patient and her husband wanted bilateral mastectomies MRI was not performed.on 04/15/2012 patient had needle core biopsy performed of the 2 areas in the left breast. The left needle core biopsy in the lower breast revealed ductal carcinoma in situ with associated comedo necrosis and calcifications with the in situ carcinoma. It was ER +100% PR +12%. 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 Tumor was ER +100% PR +11% proliferation marker Ki-67 elevated at 80% HER-2/neu showed amplification with a ratio of 6.50.   #4 patient is now status post bilateral mastectomies performed on 05/14/2012 with immediate reconstruction. The final pathology revealed bilateral breast cancers. In the right breast tissue is noted to have a 1.2 cm invasive ductal carcinoma high-grade ER negative PR negative HER-2/neu negative with Ki-67 92% one node was negative for metastatic disease but this was not the sentinel lymph node. On the left side the patient had multifocal disease measuring 1.4 cm, 1.2 cm, 0.1 cm. Tumor was ER +100% PR +11% HER-2/neu positive with a ratio 6.50. Ki-67 was 80% 8 sentinel nodes were negative for metastatic disease.  #5 patient has had immediate reconstruction with expanders bilaterally.  #6 patient will now begin adjuvant chemotherapy initially consisting of Adriamycin Cytoxan every 2 weeks for a total of 4 cycles. This will then be followed by Taxol and Herceptin to be given weekly for 12 weeks time.   CURRENT THERAPY: cycle 2 day 1 of Adriamycin Cytoxan   INTERVAL HISTORY: [place] G Perdew 61 y.o. female returns for followup visit. Overall she is feeling well she is accompanied by her husband.Overall patient tolerating her chemotherapy well. She has not had any fevers chills or night sweats. She is weak tired and fatigued. She is trying to drink as much as she possibly can and hydration is obviously helping her. She does not have any peripheral paresthesias. She does note again being tired significantly. She has no pain. Remainder of the 10 point review of systems is negative.  MEDICAL HISTORY: Past Medical History  Diagnosis  Date  . Heart murmur   . Diabetes mellitus without complication   . Osteopenia   . Cancer     breast  . Bronchitis   . Pneumonia   . Hiatal  hernia   . Sinus problem   . Diabetes mellitus   . Breast cancer 04/15/12    left-biopsy  . Breast cancer, right breast 05/14/12    right mastectomy    ALLERGIES:  is allergic to darvon and penicillins.  MEDICATIONS:  Current Outpatient Prescriptions  Medication Sig Dispense Refill  . Ascorbic Acid (VITAMIN C) 500 MG tablet Take 500 mg by mouth daily.        . Biotin 10 MG TABS Take by mouth. 4000 mcg daily      . calcitonin, salmon, (MIACALCIN/FORTICAL) 200 UNIT/ACT nasal spray Place 1 spray into the nose daily.      . Calcium Citrate (CITRACAL PO) Take by mouth.      . Cholecalciferol (VITAMIN D PO) Take 1 tablet by mouth daily.      . ciprofloxacin (CIPRO) 500 MG tablet Take 1 tablet (500 mg total) by mouth 2 (two) times daily.  14 tablet  6  . dexamethasone (DECADRON) 4 MG tablet Take 2 tablets by mouth once a day on the day after chemotherapy and then take 2 tablets two times a day for 2 days. Take with food.  30 tablet  1  . docusate sodium 100 MG CAPS Take 100 mg by mouth 2 (two) times daily.  30 capsule  1  . KRILL OIL PO Take by mouth.      . lidocaine-prilocaine (EMLA) cream Apply topically as needed.  30 g  1  . LORazepam (ATIVAN) 0.5 MG tablet Take 1 tablet (0.5 mg total) by mouth every 6 (six) hours as needed (Nausea or vomiting).  30 tablet  0  . Multiple Vitamin (MULTIVITAMIN PO) Take 1 tablet by mouth daily.       . ondansetron (ZOFRAN) 8 MG tablet Take 1 tablet (8 mg total) by mouth 2 (two) times daily as needed. Take two times a day as needed for nausea or vomiting starting on the third day after chemotherapy.  30 tablet  1  . pantoprazole (PROTONIX) 20 MG tablet Take 1 tablet (20 mg total) by mouth daily.  30 tablet  7  . pantoprazole (PROTONIX) 40 MG tablet Take 40 mg by mouth daily.        . Probiotic Product (PROBIOTIC DAILY PO) Take by mouth daily.      . rosuvastatin (CRESTOR) 5 MG tablet Take 5 mg by mouth every Monday, Wednesday, and Friday.      .  cyclobenzaprine (FLEXERIL) 10 MG tablet Take 10 mg by mouth 3 (three) times daily as needed for muscle spasms.      . prochlorperazine (COMPAZINE) 10 MG tablet Take 1 tablet (10 mg total) by mouth every 6 (six) hours as needed (Nausea or vomiting).  30 tablet  1  . prochlorperazine (COMPAZINE) 25 MG suppository Place 1 suppository (25 mg total) rectally every 12 (twelve) hours as needed for nausea.  12 suppository  3  . sitaGLIPtin (JANUVIA) 100 MG tablet Take 100 mg by mouth daily.      Marland Kitchen UNABLE TO FIND Apply 1 Device topically once. Cranial prosthesis r/t chemotherapy treatment  1 Device  0   No current facility-administered medications for this visit.    SURGICAL HISTORY:  Past Surgical History  Procedure Laterality Date  . Cesarean section  1981, 1983  . Tummy tuck  1999  . Tonsillectomy    . Foot surgery    . Gastric bypass    . Wound debridement  01/14/2011    Procedure: DEBRIDEMENT ABDOMINAL WOUND;  Surgeon: Ernestene Mention, MD;  Location: [facility];  Service: General;  Laterality: N/A;  wound debridement and debridement on the abdomen  . Cosmetic surgery    . Total mastectomy Bilateral 05/14/2012    Procedure: TOTAL MASTECTOMY;  Surgeon: Ernestene Mention, MD;  Location: [facility] OR;  Service: General;  Laterality: Bilateral;  . Axillary sentinel node biopsy Left 05/14/2012    Procedure: AXILLARY SENTINEL NODE BIOPSY;  Surgeon: Ernestene Mention, MD;  Location: [facility] OR;  Service: General;  Laterality: Left;  . Portacath placement N/A 05/14/2012    Procedure: INSERTION PORT-A-CATH;  Surgeon: Ernestene Mention, MD;  Location: [facility] OR;  Service: General;  Laterality: N/A;  . Breast reconstruction with placement of tissue expander and flex hd (acellular hydrated dermis) Bilateral 05/14/2012    Procedure: BREAST RECONSTRUCTION WITH PLACEMENT OF TISSUE EXPANDER AND FLEX HD (ACELLULAR HYDRATED DERMIS);  Surgeon: Etter Sjogren, MD;  Location: [facility] OR;  Service: Plastics;  Laterality:  Bilateral;  . Bunionectomy      REVIEW OF SYSTEMS:  Pertinent items are noted in HPI.   HEALTH MAINTENANCE:   PHYSICAL EXAMINATION: Blood pressure 144/83, pulse 77, temperature 97.7 F (36.5 C), temperature source Oral, resp. rate 20, height 5\' 4"  (1.626 m), weight 187 lb 11.2 oz (85.14 kg). Body mass index is 32.2 kg/(m^2). ECOG PERFORMANCE STATUS: 0 - Asymptomatic Well-developed well-nourished very pleasant female in no acute distress HEENT exam EOMI PERRLA sclerae anicteric no conjunctival pallor oral mucosa is moist neck is supple lungs are clear cardiovascular regular rate rhythm abdomen soft nontender no HSM extremities no edema neuro is nonfocal Bilateral expanders in place mild tenderness     LABORATORY DATA: Lab Results  Component Value Date   WBC 8.3 07/30/2012   HGB 11.7 07/30/2012   HCT 34.4* 07/30/2012   MCV 88.4 07/30/2012   PLT 245 07/30/2012      Chemistry      Component Value Date/Time   NA 143 07/30/2012 0955   NA 139 05/17/2012 0530   K 4.2 07/30/2012 0955   K 3.7 05/17/2012 0530   CL 104 07/30/2012 0955   CL 100 05/17/2012 0530   CO2 29 07/30/2012 0955   CO2 33* 05/17/2012 0530   BUN 6.1* 07/30/2012 0955   BUN 4* 05/17/2012 0530   CREATININE 0.7 07/30/2012 0955   CREATININE 0.56 05/17/2012 0530      Component Value Date/Time   CALCIUM 9.4 07/30/2012 0955   CALCIUM 8.7 05/17/2012 0530   ALKPHOS 120 07/30/2012 0955   ALKPHOS 103 05/10/2012 1440   AST 19 07/30/2012 0955   AST 25 05/10/2012 1440   ALT 24 07/30/2012 0955   ALT 17 05/10/2012 1440   BILITOT <0.20 Repeated and Verified 07/30/2012 0955   BILITOT 0.4 05/10/2012 1440    ADDITIONAL INFORMATION: 6. PROGNOSTIC INDICATORS - ACIS Results: IMMUNOHISTOCHEMICAL AND MORPHOMETRIC ANALYSIS BY THE AUTOMATED CELLULAR IMAGING SYSTEM (ACIS) Estrogen Receptor: 0%, NEGATIVE Progesterone Receptor: 0%, NEGATIVE Proliferation Marker Ki67: 92% COMMENT: The negative hormone receptor study(ies) in this case have an internal  positive control. REFERENCE RANGE ESTROGEN RECEPTOR NEGATIVE <1% POSITIVE =>1% PROGESTERONE RECEPTOR NEGATIVE <1% POSITIVE =>1% All controls stained appropriately Pecola Leisure MD Pathologist, Electronic Signature ( Signed 05/21/2012) 6. CHROMOGENIC IN-SITU HYBRIDIZATION Results: HER-2/NEU BY  CISH - NO AMPLIFICATION OF HER-2 DETECTED. 1 of 6 Duplicate copy FINAL for [place], [place] G [phone]) ADDITIONAL INFORMATION:(continued) RESULT RATIO OF HER2: CEP 17 SIGNALS 1.19 AVERAGE HER2 COPY NUMBER PER CELL 1.85 REFERENCE RANGE NEGATIVE HER2/Chr17 Ratio <2.0 and Average HER2 copy number <4.0 EQUIVOCAL HER2/Chr17 Ratio <2.0 and Average HER2 copy number 4.0 and <6.0 POSITIVE HER2/Chr17 Ratio >=2.0 and/or Average HER2 copy number >=6.0 Pecola Leisure MD Pathologist, Electronic Signature ( Signed 05/20/2012) FINAL DIAGNOSIS Diagnosis 1. Lymph node, sentinel, biopsy, Left axillary - THERE IS NO EVIDENCE OF CARCINOMA IN 1 OF 1 LYMPH NODE (0/1). 2. Lymph node, sentinel, biopsy, Left axillary - THERE IS NO EVIDENCE OF CARCINOMA IN 1 OF 1 LYMPH NODE (0/1). 3. Lymph node, sentinel, biopsy, Left axillary - THERE IS NO EVIDENCE OF CARCINOMA IN 1 OF 1 LYMPH NODE (0/1). 4. Lymph node, sentinel, biopsy, Left axillary - THERE IS NO EVIDENCE OF CARCINOMA IN 1 OF 1 LYMPH NODE (0/1). 5. Lymph node, sentinel, biopsy, Left axillary - THERE IS NO EVIDENCE OF CARCINOMA IN 1 OF 1 LYMPH NODE (0/1). 6. Breast, simple mastectomy, Right - INVASIVE DUCTAL CARCINOMA, GRADE III/III, SPANNING 1.2 CM. - DUCTAL CARCINOMA IN SITU, HIGH GRADE. - LYMPHOVASCULAR INVASION IS IDENTIFIED. - THERE IS NO EVIDENCE OF CARCINOMA IN 1 OF 1 LYMPH NODE (0/1). - SEE ONCOLOGY TABLE BELOW. 7. Breast, simple mastectomy, Left - INVASIVE DUCTAL CARCINOMA, AT LEAST THREE FOCI, GRADE II/III, SPANNING 1.4 CM, 1.2 CM, AND 0.1 CM. - DUCTAL CARCINOMA IN SITU, INTERMEDIATE GRADE. - LYMPHOVASCULAR INVASION IS IDENTIFIED. - DUCTAL  CARCINOMA IN SITU IS FOCALLY 0.2 CM TO THE ANTERIOR/INFERIOR SOFT TISSUE RESECTION MARGIN. - LOBULAR NEOPLASIA (LOBULAR CARCINOMA IN SITU). - THERE IS NO EVIDENCE OF CARCINOMA IN 2 OF 2 LYMPH NODES (0/2). - SEE ONCOLOGY TABLE BELOW. 8. Lymph node, biopsy, Left axillary - THERE IS NO EVIDENCE OF CARCINOMA IN 1 OF 1 LYMPH NODE (0/1). Microscopic Comment 6. BREAST, INVASIVE TUMOR, WITH LYMPH NODE SAMPLING Specimen, including laterality: Right breast Procedure: Simple mastectomy Grade: 3 2 of 6 Duplicate copy FINAL for [place], [place] G ([id number]) Microscopic Comment(continued) Tubule formation: 3 Nuclear pleomorphism: 3 Mitotic:2 Tumor size (gross measurement): 1.2 cm Margins: Invasive, distance to closest margin: 1.3 cm to the deep margin (gross measurement) In-situ, distance to closest margin: 1.3 cm to the deep margin (gross measurement) Lymphovascular invasion: Present Ductal carcinoma in situ: Present Grade: High grade Extensive intraductal component: Not identified Lobular neoplasia: Not identified in specimen #6 Tumor focality: Unifocal Treatment effect: N/A Extent of tumor: Confined to breast parenchyma Lymph nodes: # examined: 1 Lymph nodes with metastasis: 0 Breast prognostic profile: Will be performed on the current case and the results reported separately. TNM: pT1c, pN0 7. BREAST, INVASIVE TUMOR, WITH LYMPH NODE SAMPLING Specimen, including laterality: Left breast Procedure: Simple mastectomy Grade: 2 Tubule formation: 3 Nuclear pleomorphism: 2 Mitotic:2 Tumor size (gross measurements) 1.4 cm, 1.2 cm, and 0.1 cm Margins: Invasive, distance to closest margin: 0.7 cm to the anterior/inferior soft tissue resection margin In-situ, distance to closest margin: Focally 0.2 cm to the anterior/inferior soft tissue resection margin (glass slide measurement) Lymphovascular invasion: Present Ductal carcinoma in situ: Present Grade: Intermediate grade Extensive  intraductal component: Yes Lobular neoplasia: Yes (lobular carcinoma in situ) Tumor focality: Multiple foci Treatment effect: N/A Extent of tumor: Confined to breast parenchyma Lymph nodes: # examined: 8 Lymph nodes with metastasis: 0 Breast prognostic profile: [phone] Estrogen receptor: 100% strong staining intensity Progesterone receptor: 11% strong staining intensity Her 2  neu: Amplification was detected. The ratio was 6.50. 3 of 6 Duplicate copy FINAL for [place], [place] G ([id number]) Microscopic Comment(continued) Ki-67: 80% TNM: mpT1c, pN0 Comments: In addition to the two grossly identified nodules, there is invasive carcinoma present in random tissue submitted from the inferior lateral quadrant. The three foci of carcinoma are morphologically similar.   RADIOGRAPHIC STUDIES:  Nm Pet Image Initial (pi) Skull Base To Thigh  05/12/2012  *RADIOLOGY REPORT*  Clinical Data: Initial treatment strategy for breast cancer.   NUCLEAR MEDICINE PET SKULL BASE TO THIGH  Fasting Blood Glucose:  143  Technique:  20 mCi F-18 FDG was injected intravenously. CT data was obtained and used for attenuation correction and anatomic localization only.  (This was not acquired as a diagnostic CT examination.) Additional exam technical data entered on technologist worksheet.  Comparison:  none  Findings:  Neck: No hypermetabolic lymph nodes in the neck.  Chest:  In the inferior medial aspect of the right breast there is a 10 mm nodule (image 99) with moderate  metabolic activity ( SUV max = 2.9).  No hypermetabolic mediastinal or hilar lymph nodes.  There is hypermetabolic activity in the thoracic inlet which is felt to be vascular in nature.  There is activity adjacent to the thoracic aorta which is also felt to be vascular may relate to the hemiazygos vein.  Abdomen/Pelvis:  No abnormal hypermetabolic activity within the liver, pancreas, adrenal glands, or spleen.  No hypermetabolic lymph nodes in  the abdomen or pelvis.  Skeleton:  No focal hypermetabolic activity to suggest skeletal metastasis.  IMPRESSION:  1.  No evidence of distant breast cancer metastasis. 2.  Hypermetabolic nodule within the inferior medial right breast   Original Report Authenticated By: Genevive Bi, M.D.    Nm Sentinel Node Inj-no Rpt (breast)  05/14/2012  CLINICAL DATA: cancer left breast   Sulfur colloid was injected intradermally by the nuclear medicine  technologist for breast cancer sentinel node localization.     Dg Chest Port 1 View  05/14/2012  *RADIOLOGY REPORT*  Clinical Data: Status post Port-A-Cath placement  PORTABLE CHEST - 1 VIEW  Comparison: None.  Findings: The cardiac shadow is within normal limits.  Bilateral tissue expanders as well as surgical drains are identified.  A right chest wall port is seen. The catheter tip is at the cavoatrial junction.  No pneumothorax is noted.  The lungs are clear bilaterally.  IMPRESSION: No evidence of pneumothorax following port placement.  Bilateral tissue expanders.   Original Report Authenticated By: Alcide Clever, M.D.    Dg Fluoro Guide Cv Line-no Report  05/14/2012  CLINICAL DATA: LEFT BREAST CANCER/PORT-A-CATH   FLOURO GUIDE CV LINE  Fluoroscopy was utilized by the requesting physician.  No radiographic  interpretation.      ASSESSMENT: 62 year old female with  #1 bilateral breast cancers on the right side she has triple-negative disease stage I left side stage I ER positive PR positive HER-2/neu positive disease. Patient is status post bilateral mastectomies. Of note on the right side the lymph node removed was not a sentinel lymph node. It is unclear what to do with this side in terms of whether or not to do further sentinel lymph node. Clinically patient seems to be doing well.  #2 we discussed her pathology in detail. We discussed chemotherapy to be given adjuvantly. We discussed Adriamycin Cytoxan given every 2 weeks for a total of 4 cycles followed by  Taxol and Herceptin given weekly x12 weeks followed by Herceptin every  3 weeks to finish out a total of one year of therapy. We also discussed role of adjuvant hormonal therapy since the left side is ER positive.  #3 patient is status post cycle 1 of Adriamycin and Cytoxan given on 07/02/2012. She tolerated this well. Now here for cycle 2 of AC  #4 neutropenia resolved  #5 fatigue secondary to chemotherapy  PLAN:  #1proceed with cycle 2 of AC  #2 she will return in 1 week for follow up  All questions were answered. The patient knows to call the clinic with any problems, questions or concerns. We can certainly see the patient much sooner if necessary.  I spent 25 minutes counseling the patient face to face. The total time spent in the appointment was 30 minutes.    Drue Second, MD Medical/Oncology [facility] [phone] (beeper) [phone] (Office)

## 2012-08-08 NOTE — Progress Notes (Signed)
OFFICE PROGRESS NOTE  CC  [name], MD [address] [place] [address] Dr. 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 Patient is status post bilateral mastectomy with immediate reconstruction  STAGE:  Right breast (mastectomy)  T1cN0 1.2 cm ER-/PR-/her-/ki-67 92% 0/1 nodes positive  Left breast (mastectomy with snl) T1cN0 1.4 cm, 1.2 cm, 0.1c ER 100%, PR 11%, her2neu positive 6.50, Ki-67 80%   PRIOR THERAPY:  #1screening mammogram performed that showed 2 areas of concern on the left side.calcifications were noted in the left. In the right breast a possible mass warranted further evaluation.   #2 On 03/29/2012 patient underwent a bilateral diagnostic mammogram and right breast ultrasound. A spot magnification images demonstrate suspicious group of pleomorphic microcalcifications over the outer lower left breast with additional suspicious group of pleomorphic microcalcifications over the outer midportion of the left periareolar region. Spot compression images of the right breast demonstrate persistence of a 1 cm density at the edge of the film in the deep third of the right inner breast.   #3Ultrasound performed showed no focal abnormality over the entire in her right breast to correspond to the mammographic density. Patient was recommended stereotactic core needle biopsy of the 2 groups of suspicious left breast microcalcifications. Because patient and her husband wanted bilateral mastectomies MRI was not performed.on 04/15/2012 patient had needle core biopsy performed of the 2 areas in the left breast. The left needle core biopsy in the lower breast revealed ductal carcinoma in situ with associated comedo necrosis and calcifications with the in situ carcinoma. It was ER +100% PR +12%. 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 Tumor was ER +100% PR +11% proliferation marker Ki-67 elevated at 80% HER-2/neu showed amplification with a ratio of 6.50.   #4 patient is now status post bilateral mastectomies performed on 05/14/2012 with immediate reconstruction. The final pathology revealed bilateral breast cancers. In the right breast tissue is noted to have a 1.2 cm invasive ductal carcinoma high-grade ER negative PR negative HER-2/neu negative with Ki-67 92% one node was negative for metastatic disease but this was not the sentinel lymph node. On the left side the patient had multifocal disease measuring 1.4 cm, 1.2 cm, 0.1 cm. Tumor was ER +100% PR +11% HER-2/neu positive with a ratio 6.50. Ki-67 was 80% 8 sentinel nodes were negative for metastatic disease.  #5 patient has had immediate reconstruction with expanders bilaterally.  #6 patient will now begin adjuvant chemotherapy initially consisting of Adriamycin Cytoxan every 2 weeks for a total of 4 cycles. This will then be followed by Taxol and Herceptin to be given weekly for 12 weeks time.   CURRENT THERAPY: cycle 1 day 8 of Adriamycin Cytoxan starting 07/02/2012  INTERVAL HISTORY: [name] [name] 62 y.o. female returns for followup visit. Overall she is feeling well she is accompanied by her husband.Overall patient tolerated her chemotherapy well. She is neutropenic. She has not had any fevers chills or night sweats. She is weak tired and fatigued. She is trying to drink as much as she possibly can and hydration is obviously helping her. She does not have any peripheral paresthesias. She does note again being tired significantly. She has no pain. Remainder of the 10 point review of systems is negative.  MEDICAL HISTORY: Past  Medical History  Diagnosis Date  . Heart murmur   . Diabetes mellitus without complication   . Osteopenia   . Cancer     breast  .  Bronchitis   . Pneumonia   . Hiatal hernia   . Sinus problem   . Diabetes mellitus   . Breast cancer 04/15/12    left-biopsy  . Breast cancer, right breast 05/14/12    right mastectomy    ALLERGIES:  is allergic to darvon and penicillins.  MEDICATIONS:  Current Outpatient Prescriptions  Medication Sig Dispense Refill  . Ascorbic Acid (VITAMIN C) 500 MG tablet Take 500 mg by mouth daily.        . Biotin 10 MG TABS Take by mouth. 4000 mcg daily      . calcitonin, salmon, (MIACALCIN/FORTICAL) 200 UNIT/ACT nasal spray Place 1 spray into the nose daily.      . Calcium Citrate (CITRACAL PO) Take by mouth.      . Cholecalciferol (VITAMIN D PO) Take 1 tablet by mouth daily.      Marland Kitchen docusate sodium 100 MG CAPS Take 100 mg by mouth 2 (two) times daily.  30 capsule  1  . KRILL OIL PO Take by mouth.      . Multiple Vitamin (MULTIVITAMIN PO) Take 1 tablet by mouth daily.       . pantoprazole (PROTONIX) 20 MG tablet Take 1 tablet (20 mg total) by mouth daily.  30 tablet  7  . pantoprazole (PROTONIX) 40 MG tablet Take 40 mg by mouth daily.        . Probiotic Product (PROBIOTIC DAILY PO) Take by mouth daily.      . rosuvastatin (CRESTOR) 5 MG tablet Take 5 mg by mouth every Monday, Wednesday, and Friday.      . ciprofloxacin (CIPRO) 500 MG tablet Take 1 tablet (500 mg total) by mouth 2 (two) times daily.  14 tablet  6  . cyclobenzaprine (FLEXERIL) 10 MG tablet Take 10 mg by mouth 3 (three) times daily as needed for muscle spasms.      Marland Kitchen dexamethasone (DECADRON) 4 MG tablet Take 2 tablets by mouth once a day on the day after chemotherapy and then take 2 tablets two times a day for 2 days. Take with food.  30 tablet  1  . lidocaine-prilocaine (EMLA) cream Apply topically as needed.  30 g  1  . LORazepam (ATIVAN) 0.5 MG tablet Take 1 tablet (0.5 mg total) by mouth every 6 (six) hours as needed (Nausea or vomiting).  30 tablet  0  . ondansetron (ZOFRAN) 8 MG tablet Take 1 tablet (8 mg total) by mouth 2  (two) times daily as needed. Take two times a day as needed for nausea or vomiting starting on the third day after chemotherapy.  30 tablet  1  . prochlorperazine (COMPAZINE) 10 MG tablet Take 1 tablet (10 mg total) by mouth every 6 (six) hours as needed (Nausea or vomiting).  30 tablet  1  . prochlorperazine (COMPAZINE) 25 MG suppository Place 1 suppository (25 mg total) rectally every 12 (twelve) hours as needed for nausea.  12 suppository  3  . sitaGLIPtin (JANUVIA) 100 MG tablet Take 100 mg by mouth daily.      Marland Kitchen UNABLE TO FIND Apply 1 Device topically once. Cranial prosthesis r/t chemotherapy treatment  1 Device  0   No current facility-administered medications for this visit.    SURGICAL HISTORY:  Past Surgical History  Procedure Laterality Date  .  Cesarean section  1981, 1983  . Tummy tuck  1999  . Tonsillectomy    . Foot surgery    . Gastric bypass    . Wound debridement  01/14/2011    Procedure: DEBRIDEMENT ABDOMINAL WOUND;  Surgeon: Ernestene Mention, MD;  Location: [facility];  Service: General;  Laterality: N/A;  wound debridement and debridement on the abdomen  . Cosmetic surgery    . Total mastectomy Bilateral 05/14/2012    Procedure: TOTAL MASTECTOMY;  Surgeon: Ernestene Mention, MD;  Location: [facility] OR;  Service: General;  Laterality: Bilateral;  . Axillary sentinel node biopsy Left 05/14/2012    Procedure: AXILLARY SENTINEL NODE BIOPSY;  Surgeon: Ernestene Mention, MD;  Location: [facility] OR;  Service: General;  Laterality: Left;  . Portacath placement N/A 05/14/2012    Procedure: INSERTION PORT-A-CATH;  Surgeon: Ernestene Mention, MD;  Location: [facility] OR;  Service: General;  Laterality: N/A;  . Breast reconstruction with placement of tissue expander and flex hd (acellular hydrated dermis) Bilateral 05/14/2012    Procedure: BREAST RECONSTRUCTION WITH PLACEMENT OF TISSUE EXPANDER AND FLEX HD (ACELLULAR HYDRATED DERMIS);  Surgeon: Etter Sjogren, MD;  Location: [facility] OR;  Service:  Plastics;  Laterality: Bilateral;  . Bunionectomy      REVIEW OF SYSTEMS:  Pertinent items are noted in HPI.   HEALTH MAINTENANCE:   PHYSICAL EXAMINATION: Blood pressure 147/83, pulse 88, temperature 98.5 F (36.9 C), temperature source Oral, resp. rate 20, height 5\' 4"  (1.626 m), weight 188 lb (85.276 kg). Body mass index is 32.25 kg/(m^2). ECOG PERFORMANCE STATUS: 0 - Asymptomatic Well-developed well-nourished very pleasant female in no acute distress HEENT exam EOMI PERRLA sclerae anicteric no conjunctival pallor oral mucosa is moist neck is supple lungs are clear cardiovascular regular rate rhythm abdomen soft nontender no HSM extremities no edema neuro is nonfocal Bilateral expanders in place mild tenderness     LABORATORY DATA: Lab Results  Component Value Date   WBC 8.3 07/30/2012   HGB 11.7 07/30/2012   HCT 34.4* 07/30/2012   MCV 88.4 07/30/2012   PLT 245 07/30/2012      Chemistry      Component Value Date/Time   NA 143 07/30/2012 0955   NA 139 05/17/2012 0530   K 4.2 07/30/2012 0955   K 3.7 05/17/2012 0530   CL 104 07/30/2012 0955   CL 100 05/17/2012 0530   CO2 29 07/30/2012 0955   CO2 33* 05/17/2012 0530   BUN 6.1* 07/30/2012 0955   BUN 4* 05/17/2012 0530   CREATININE 0.7 07/30/2012 0955   CREATININE 0.56 05/17/2012 0530      Component Value Date/Time   CALCIUM 9.4 07/30/2012 0955   CALCIUM 8.7 05/17/2012 0530   ALKPHOS 120 07/30/2012 0955   ALKPHOS 103 05/10/2012 1440   AST 19 07/30/2012 0955   AST 25 05/10/2012 1440   ALT 24 07/30/2012 0955   ALT 17 05/10/2012 1440   BILITOT <0.20 Repeated and Verified 07/30/2012 0955   BILITOT 0.4 05/10/2012 1440    ADDITIONAL INFORMATION: 6. PROGNOSTIC INDICATORS - ACIS Results: IMMUNOHISTOCHEMICAL AND MORPHOMETRIC ANALYSIS BY THE AUTOMATED CELLULAR IMAGING SYSTEM (ACIS) Estrogen Receptor: 0%, NEGATIVE Progesterone Receptor: 0%, NEGATIVE Proliferation Marker Ki67: 92% COMMENT: The negative hormone receptor study(ies) in this case  have an internal positive control. REFERENCE RANGE ESTROGEN RECEPTOR NEGATIVE <1% POSITIVE =>1% PROGESTERONE RECEPTOR NEGATIVE <1% POSITIVE =>1% All controls stained appropriately Pecola Leisure MD Pathologist, Electronic Signature ( Signed 05/21/2012) 6. CHROMOGENIC IN-SITU HYBRIDIZATION Results: HER-2/NEU  BY CISH - NO AMPLIFICATION OF HER-2 DETECTED. 1 of 6 Duplicate copy FINAL for Creger, [name] G [id number]) ADDITIONAL INFORMATION:(continued) RESULT RATIO OF HER2: CEP 17 SIGNALS 1.19 AVERAGE HER2 COPY NUMBER PER CELL 1.85 REFERENCE RANGE NEGATIVE HER2/Chr17 Ratio <2.0 and Average HER2 copy number <4.0 EQUIVOCAL HER2/Chr17 Ratio <2.0 and Average HER2 copy number 4.0 and <6.0 POSITIVE HER2/Chr17 Ratio >=2.0 and/or Average HER2 copy number >=6.0 Pecola Leisure MD Pathologist, Electronic Signature ( Signed 05/20/2012) FINAL DIAGNOSIS Diagnosis 1. Lymph node, sentinel, biopsy, Left axillary - THERE IS NO EVIDENCE OF CARCINOMA IN 1 OF 1 LYMPH NODE (0/1). 2. Lymph node, sentinel, biopsy, Left axillary - THERE IS NO EVIDENCE OF CARCINOMA IN 1 OF 1 LYMPH NODE (0/1). 3. Lymph node, sentinel, biopsy, Left axillary - THERE IS NO EVIDENCE OF CARCINOMA IN 1 OF 1 LYMPH NODE (0/1). 4. Lymph node, sentinel, biopsy, Left axillary - THERE IS NO EVIDENCE OF CARCINOMA IN 1 OF 1 LYMPH NODE (0/1). 5. Lymph node, sentinel, biopsy, Left axillary - THERE IS NO EVIDENCE OF CARCINOMA IN 1 OF 1 LYMPH NODE (0/1). 6. Breast, simple mastectomy, Right - INVASIVE DUCTAL CARCINOMA, GRADE III/III, SPANNING 1.2 CM. - DUCTAL CARCINOMA IN SITU, HIGH GRADE. - LYMPHOVASCULAR INVASION IS IDENTIFIED. - THERE IS NO EVIDENCE OF CARCINOMA IN 1 OF 1 LYMPH NODE (0/1). - SEE ONCOLOGY TABLE BELOW. 7. Breast, simple mastectomy, Left - INVASIVE DUCTAL CARCINOMA, AT LEAST THREE FOCI, GRADE II/III, SPANNING 1.4 CM, 1.2 CM, AND 0.1 CM. - DUCTAL CARCINOMA IN SITU, INTERMEDIATE GRADE. - LYMPHOVASCULAR INVASION IS  IDENTIFIED. - DUCTAL CARCINOMA IN SITU IS FOCALLY 0.2 CM TO THE ANTERIOR/INFERIOR SOFT TISSUE RESECTION MARGIN. - LOBULAR NEOPLASIA (LOBULAR CARCINOMA IN SITU). - THERE IS NO EVIDENCE OF CARCINOMA IN 2 OF 2 LYMPH NODES (0/2). - SEE ONCOLOGY TABLE BELOW. 8. Lymph node, biopsy, Left axillary - THERE IS NO EVIDENCE OF CARCINOMA IN 1 OF 1 LYMPH NODE (0/1). Microscopic Comment 6. BREAST, INVASIVE TUMOR, WITH LYMPH NODE SAMPLING Specimen, including laterality: Right breast Procedure: Simple mastectomy Grade: 3 2 of 6 Duplicate copy FINAL for Petruzzi, [name] G ([id number]) Microscopic Comment(continued) Tubule formation: 3 Nuclear pleomorphism: 3 Mitotic:2 Tumor size (gross measurement): 1.2 cm Margins: Invasive, distance to closest margin: 1.3 cm to the deep margin (gross measurement) In-situ, distance to closest margin: 1.3 cm to the deep margin (gross measurement) Lymphovascular invasion: Present Ductal carcinoma in situ: Present Grade: High grade Extensive intraductal component: Not identified Lobular neoplasia: Not identified in specimen #6 Tumor focality: Unifocal Treatment effect: N/A Extent of tumor: Confined to breast parenchyma Lymph nodes: # examined: 1 Lymph nodes with metastasis: 0 Breast prognostic profile: Will be performed on the current case and the results reported separately. TNM: pT1c, pN0 7. BREAST, INVASIVE TUMOR, WITH LYMPH NODE SAMPLING Specimen, including laterality: Left breast Procedure: Simple mastectomy Grade: 2 Tubule formation: 3 Nuclear pleomorphism: 2 Mitotic:2 Tumor size (gross measurements) 1.4 cm, 1.2 cm, and 0.1 cm Margins: Invasive, distance to closest margin: 0.7 cm to the anterior/inferior soft tissue resection margin In-situ, distance to closest margin: Focally 0.2 cm to the anterior/inferior soft tissue resection margin (glass slide measurement) Lymphovascular invasion: Present Ductal carcinoma in situ: Present Grade: Intermediate  grade Extensive intraductal component: Yes Lobular neoplasia: Yes (lobular carcinoma in situ) Tumor focality: Multiple foci Treatment effect: N/A Extent of tumor: Confined to breast parenchyma Lymph nodes: # examined: 8 Lymph nodes with metastasis: 0 Breast prognostic profile: [phone] Estrogen receptor: 100% strong staining intensity Progesterone receptor: 11% strong staining intensity Her  2 neu: Amplification was detected. The ratio was 6.50. 3 of 6 Duplicate copy FINAL for Sikkema, [name] G ([id number]) Microscopic Comment(continued) Ki-67: 80% TNM: mpT1c, pN0 Comments: In addition to the two grossly identified nodules, there is invasive carcinoma present in random tissue submitted from the inferior lateral quadrant. The three foci of carcinoma are morphologically similar.   RADIOGRAPHIC STUDIES:  Nm Pet Image Initial (pi) Skull Base To Thigh  05/12/2012  *RADIOLOGY REPORT*  Clinical Data: Initial treatment strategy for breast cancer.   NUCLEAR MEDICINE PET SKULL BASE TO THIGH  Fasting Blood Glucose:  143  Technique:  20 mCi F-18 FDG was injected intravenously. CT data was obtained and used for attenuation correction and anatomic localization only.  (This was not acquired as a diagnostic CT examination.) Additional exam technical data entered on technologist worksheet.  Comparison:  none  Findings:  Neck: No hypermetabolic lymph nodes in the neck.  Chest:  In the inferior medial aspect of the right breast there is a 10 mm nodule (image 99) with moderate  metabolic activity ( SUV max = 2.9).  No hypermetabolic mediastinal or hilar lymph nodes.  There is hypermetabolic activity in the thoracic inlet which is felt to be vascular in nature.  There is activity adjacent to the thoracic aorta which is also felt to be vascular may relate to the hemiazygos vein.  Abdomen/Pelvis:  No abnormal hypermetabolic activity within the liver, pancreas, adrenal glands, or spleen.  No hypermetabolic  lymph nodes in the abdomen or pelvis.  Skeleton:  No focal hypermetabolic activity to suggest skeletal metastasis.  IMPRESSION:  1.  No evidence of distant breast cancer metastasis. 2.  Hypermetabolic nodule within the inferior medial right breast   Original Report Authenticated By: Genevive Bi, M.D.    Nm Sentinel Node Inj-no Rpt (breast)  05/14/2012  CLINICAL DATA: cancer left breast   Sulfur colloid was injected intradermally by the nuclear medicine  technologist for breast cancer sentinel node localization.     Dg Chest Port 1 View  05/14/2012  *RADIOLOGY REPORT*  Clinical Data: Status post Port-A-Cath placement  PORTABLE CHEST - 1 VIEW  Comparison: None.  Findings: The cardiac shadow is within normal limits.  Bilateral tissue expanders as well as surgical drains are identified.  A right chest wall port is seen. The catheter tip is at the cavoatrial junction.  No pneumothorax is noted.  The lungs are clear bilaterally.  IMPRESSION: No evidence of pneumothorax following port placement.  Bilateral tissue expanders.   Original Report Authenticated By: Alcide Clever, M.D.    Dg Fluoro Guide Cv Line-no Report  05/14/2012  CLINICAL DATA: LEFT BREAST CANCER/PORT-A-CATH   FLOURO GUIDE CV LINE  Fluoroscopy was utilized by the requesting physician.  No radiographic  interpretation.      ASSESSMENT: 62 year old female with  #1 bilateral breast cancers on the right side she has triple-negative disease stage I left side stage I ER positive PR positive HER-2/neu positive disease. Patient is status post bilateral mastectomies. Of note on the right side the lymph node removed was not a sentinel lymph node. It is unclear what to do with this side in terms of whether or not to do further sentinel lymph node. Clinically patient seems to be doing well.  #2 we discussed her pathology in detail. We discussed chemotherapy to be given adjuvantly. We discussed Adriamycin Cytoxan given every 2 weeks for a total of 4  cycles followed by Taxol and Herceptin given weekly x12 weeks followed by Herceptin  every 3 weeks to finish out a total of one year of therapy. We also discussed role of adjuvant hormonal therapy since the left side is ER positive.  #3 patient is status post cycle 1 of Adriamycin and Cytoxan given on 07/02/2012. She tolerated this well.  #4 neutropenia due to chemotherapy given last week.  #5 fatigue secondary to chemotherapy  PLAN:  #1 patient will proceed with prophylactic antibiotic consisting of Cipro 500 mg twice a day for 7 days.  #2 neutropenic precautions were discussed with the patient.  #3 she will be seen back in one week's time for cycle 2 of Adriamycin and Cytoxan.  All questions were answered. The patient knows to call the clinic with any problems, questions or concerns. We can certainly see the patient much sooner if necessary.  I spent 25 minutes counseling the patient face to face. The total time spent in the appointment was 30 minutes.    Drue Second, MD Medical/Oncology [facility] [phone] (beeper) [phone] (Office)

## 2012-08-13 ENCOUNTER — Telehealth: Payer: Self-pay | Admitting: *Deleted

## 2012-08-13 ENCOUNTER — Encounter: Payer: Self-pay | Admitting: Oncology

## 2012-08-13 ENCOUNTER — Ambulatory Visit (HOSPITAL_BASED_OUTPATIENT_CLINIC_OR_DEPARTMENT_OTHER): Payer: BC Managed Care – PPO | Admitting: Oncology

## 2012-08-13 ENCOUNTER — Other Ambulatory Visit: Payer: BC Managed Care – PPO | Admitting: Lab

## 2012-08-13 ENCOUNTER — Other Ambulatory Visit (HOSPITAL_BASED_OUTPATIENT_CLINIC_OR_DEPARTMENT_OTHER): Payer: BC Managed Care – PPO | Admitting: Lab

## 2012-08-13 ENCOUNTER — Ambulatory Visit (HOSPITAL_BASED_OUTPATIENT_CLINIC_OR_DEPARTMENT_OTHER): Payer: BC Managed Care – PPO

## 2012-08-13 VITALS — BP 138/70 | HR 101 | Temp 98.5°F | Resp 20 | Ht 64.0 in | Wt 187.1 lb

## 2012-08-13 DIAGNOSIS — C50911 Malignant neoplasm of unspecified site of right female breast: Secondary | ICD-10-CM

## 2012-08-13 DIAGNOSIS — Z170 Estrogen receptor positive status [ER+]: Secondary | ICD-10-CM

## 2012-08-13 DIAGNOSIS — C50919 Malignant neoplasm of unspecified site of unspecified female breast: Secondary | ICD-10-CM

## 2012-08-13 DIAGNOSIS — Z5111 Encounter for antineoplastic chemotherapy: Secondary | ICD-10-CM

## 2012-08-13 DIAGNOSIS — C50119 Malignant neoplasm of central portion of unspecified female breast: Secondary | ICD-10-CM

## 2012-08-13 DIAGNOSIS — C50912 Malignant neoplasm of unspecified site of left female breast: Secondary | ICD-10-CM

## 2012-08-13 LAB — COMPREHENSIVE METABOLIC PANEL (CC13)
ALT: 20 U/L (ref 0–55)
AST: 18 U/L (ref 5–34)
Albumin: 3.5 g/dL (ref 3.5–5.0)
Alkaline Phosphatase: 116 U/L (ref 40–150)
BUN: 7.3 mg/dL (ref 7.0–26.0)
CO2: 31 mEq/L — ABNORMAL HIGH (ref 22–29)
Calcium: 9.9 mg/dL (ref 8.4–10.4)
Chloride: 107 mEq/L (ref 98–109)
Creatinine: 0.7 mg/dL (ref 0.6–1.1)
Glucose: 140 mg/dl (ref 70–140)
Potassium: 4 mEq/L (ref 3.5–5.1)
Sodium: 147 mEq/L — ABNORMAL HIGH (ref 136–145)
Total Bilirubin: <0.2 mg/dL (ref 0.20–1.20)
Total Protein: 6.5 g/dL (ref 6.4–8.3)

## 2012-08-13 LAB — CBC WITH DIFFERENTIAL/PLATELET
BASO%: 0.5 % (ref 0.0–2.0)
Basophils Absolute: 0 10*3/uL (ref 0.0–0.1)
EOS%: 0.1 % (ref 0.0–7.0)
Eosinophils Absolute: 0 10*3/uL (ref 0.0–0.5)
HCT: 35.5 % (ref 34.8–46.6)
HGB: 11.5 g/dL — ABNORMAL LOW (ref 11.6–15.9)
LYMPH%: 13.5 % — ABNORMAL LOW (ref 14.0–49.7)
MCH: 29.2 pg (ref 25.1–34.0)
MCHC: 32.4 g/dL (ref 31.5–36.0)
MCV: 90.1 fL (ref 79.5–101.0)
MONO#: 0.8 10*3/uL (ref 0.1–0.9)
MONO%: 9.4 % (ref 0.0–14.0)
NEUT#: 6.1 10*3/uL (ref 1.5–6.5)
NEUT%: 76.5 % (ref 38.4–76.8)
Platelets: 264 10*3/uL (ref 145–400)
RBC: 3.94 10*6/uL (ref 3.70–5.45)
RDW: 15.4 % — ABNORMAL HIGH (ref 11.2–14.5)
WBC: 8 10*3/uL (ref 3.9–10.3)
lymph#: 1.1 10*3/uL (ref 0.9–3.3)

## 2012-08-13 MED ORDER — DEXAMETHASONE SODIUM PHOSPHATE 20 MG/5ML IJ SOLN
12.0000 mg | Freq: Once | INTRAMUSCULAR | Status: AC
  Administered 2012-08-13: 12 mg via INTRAVENOUS

## 2012-08-13 MED ORDER — SODIUM CHLORIDE 0.9 % IJ SOLN
10.0000 mL | INTRAMUSCULAR | Status: DC | PRN
  Administered 2012-08-13: 10 mL
  Filled 2012-08-13: qty 10

## 2012-08-13 MED ORDER — DOXORUBICIN HCL CHEMO IV INJECTION 2 MG/ML
60.0000 mg/m2 | Freq: Once | INTRAVENOUS | Status: AC
  Administered 2012-08-13: 118 mg via INTRAVENOUS
  Filled 2012-08-13: qty 59

## 2012-08-13 MED ORDER — SODIUM CHLORIDE 0.9 % IV SOLN
600.0000 mg/m2 | Freq: Once | INTRAVENOUS | Status: AC
  Administered 2012-08-13: 1180 mg via INTRAVENOUS
  Filled 2012-08-13: qty 59

## 2012-08-13 MED ORDER — SODIUM CHLORIDE 0.9 % IV SOLN
150.0000 mg | Freq: Once | INTRAVENOUS | Status: AC
  Administered 2012-08-13: 150 mg via INTRAVENOUS
  Filled 2012-08-13: qty 5

## 2012-08-13 MED ORDER — SODIUM CHLORIDE 0.9 % IV SOLN
Freq: Once | INTRAVENOUS | Status: AC
  Administered 2012-08-13: 12:00:00 via INTRAVENOUS

## 2012-08-13 MED ORDER — LORAZEPAM 2 MG/ML IJ SOLN
0.5000 mg | Freq: Once | INTRAMUSCULAR | Status: AC
  Administered 2012-08-13: 0.5 mg via INTRAVENOUS

## 2012-08-13 MED ORDER — PALONOSETRON HCL INJECTION 0.25 MG/5ML
0.2500 mg | Freq: Once | INTRAVENOUS | Status: AC
  Administered 2012-08-13: 0.25 mg via INTRAVENOUS

## 2012-08-13 MED ORDER — HEPARIN SOD (PORK) LOCK FLUSH 100 UNIT/ML IV SOLN
500.0000 [IU] | Freq: Once | INTRAVENOUS | Status: AC | PRN
  Administered 2012-08-13: 500 [IU]
  Filled 2012-08-13: qty 5

## 2012-08-13 NOTE — Patient Instructions (Addendum)
#  1 proceed with cycle # 4  Of Adriamycin and Cytoxan today.  #2 he will return tomorrow for Neulasta injection.  #3 he will begin weekly Taxol and Herceptin beginning 08/27/2012 for a total of 12 weeks.

## 2012-08-13 NOTE — Patient Instructions (Signed)
[facility] Discharge Instructions for Patients Receiving Chemotherapy  Today you received the following chemotherapy agents Adriamycin/Cytoxan  To help prevent nausea and vomiting after your treatment, we encourage you to take your nausea medication as needed   If you develop nausea and vomiting that is not controlled by your nausea medication, call the clinic.   BELOW ARE SYMPTOMS THAT SHOULD BE REPORTED IMMEDIATELY:  *FEVER GREATER THAN 100.5 F  *CHILLS WITH OR WITHOUT FEVER  NAUSEA AND VOMITING THAT IS NOT CONTROLLED WITH YOUR NAUSEA MEDICATION  *UNUSUAL SHORTNESS OF BREATH  *UNUSUAL BRUISING OR BLEEDING  TENDERNESS IN MOUTH AND THROAT WITH OR WITHOUT PRESENCE OF ULCERS  *URINARY PROBLEMS  *BOWEL PROBLEMS  UNUSUAL RASH Items with * indicate a potential emergency and should be followed up as soon as possible.  Feel free to call the clinic you have any questions or concerns. The clinic phone number is [phone].    

## 2012-08-13 NOTE — Progress Notes (Signed)
OFFICE PROGRESS NOTE  CC  [name], MD [address] [place] [address] Dr. 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 Patient is status post bilateral mastectomy with immediate reconstruction  STAGE:  Right breast (mastectomy)  T1cN0 1.2 cm ER-/PR-/her-/ki-67 92% 0/1 nodes positive  Left breast (mastectomy with snl) T1cN0 1.4 cm, 1.2 cm, 0.1c ER 100%, PR 11%, her2neu positive 6.50, Ki-67 80%   PRIOR THERAPY:  #1screening mammogram performed that showed 2 areas of concern on the left side.calcifications were noted in the left. In the right breast a possible mass warranted further evaluation.   #2 On 03/29/2012 patient underwent a bilateral diagnostic mammogram and right breast ultrasound. A spot magnification images demonstrate suspicious group of pleomorphic microcalcifications over the outer lower left breast with additional suspicious group of pleomorphic microcalcifications over the outer midportion of the left periareolar region. Spot compression images of the right breast demonstrate persistence of a 1 cm density at the edge of the film in the deep third of the right inner breast.   #3Ultrasound performed showed no focal abnormality over the entire in her right breast to correspond to the mammographic density. Patient was recommended stereotactic core needle biopsy of the 2 groups of suspicious left breast microcalcifications. Because patient and her husband wanted bilateral mastectomies MRI was not performed.on 04/15/2012 patient had needle core biopsy performed of the 2 areas in the left breast. The left needle core biopsy in the lower breast revealed ductal carcinoma in situ with associated comedo necrosis and calcifications with the in situ carcinoma. It was ER +100% PR +12%. 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 Tumor was ER +100% PR +11% proliferation marker Ki-67 elevated at 80% HER-2/neu showed amplification with a ratio of 6.50.   #4 patient is now status post bilateral mastectomies performed on 05/14/2012 with immediate reconstruction. The final pathology revealed bilateral breast cancers. In the right breast tissue is noted to have a 1.2 cm invasive ductal carcinoma high-grade ER negative PR negative HER-2/neu negative with Ki-67 92% one node was negative for metastatic disease but this was not the sentinel lymph node. On the left side the patient had multifocal disease measuring 1.4 cm, 1.2 cm, 0.1 cm. Tumor was ER +100% PR +11% HER-2/neu positive with a ratio 6.50. Ki-67 was 80% 8 sentinel nodes were negative for metastatic disease.  #5 patient has had immediate reconstruction with expanders bilaterally.  #6 patient will now begin adjuvant chemotherapy initially consisting of Adriamycin Cytoxan every 2 weeks for a total of 4 cycles. This will then be followed by Taxol and Herceptin to be given weekly for 12 weeks time.   CURRENT THERAPY: cycle 4 day 1 of Adriamycin Cytoxan starting 08/12/12  INTERVAL HISTORY: [place] G Cly 62 y.o. female returns for followup visit. Overall she is feeling well she is accompanied by her husband. She is ready to get started on her chemotherapy.she returned from her beach trip. She denied having any problems there. She is well rested. She denies any fevers chills night sweats headaches. She has no peripheral paresthesias no myalgias and arthralgias no shortness of breath cough hemoptysis hematemesis no chest pains. Remainder of the 10 point review of systems is negative.  MEDICAL HISTORY: Past Medical History  Diagnosis Date  . Heart  murmur   . Diabetes mellitus without complication   . Osteopenia   . Cancer     breast  . Bronchitis   . Pneumonia   .  Hiatal hernia   . Sinus problem   . Diabetes mellitus   . Breast cancer 04/15/12    left-biopsy  . Breast cancer, right breast 05/14/12    right mastectomy    ALLERGIES:  is allergic to darvon and penicillins.  MEDICATIONS:  Current Outpatient Prescriptions  Medication Sig Dispense Refill  . Ascorbic Acid (VITAMIN C) 500 MG tablet Take 500 mg by mouth daily.        . Biotin 10 MG TABS Take by mouth. 4000 mcg daily      . calcitonin, salmon, (MIACALCIN/FORTICAL) 200 UNIT/ACT nasal spray Place 1 spray into the nose daily.      . Calcium Citrate (CITRACAL PO) Take by mouth.      . Cholecalciferol (VITAMIN D PO) Take 1 tablet by mouth daily.      Marland Kitchen dexamethasone (DECADRON) 4 MG tablet Take 2 tablets by mouth once a day on the day after chemotherapy and then take 2 tablets two times a day for 2 days. Take with food.  30 tablet  1  . docusate sodium 100 MG CAPS Take 100 mg by mouth 2 (two) times daily.  30 capsule  1  . KRILL OIL PO Take by mouth.      . lidocaine-prilocaine (EMLA) cream Apply topically as needed.  30 g  1  . LORazepam (ATIVAN) 0.5 MG tablet Take 1 tablet (0.5 mg total) by mouth every 6 (six) hours as needed (Nausea or vomiting).  30 tablet  0  . Multiple Vitamin (MULTIVITAMIN PO) Take 1 tablet by mouth daily.       . pantoprazole (PROTONIX) 40 MG tablet Take 40 mg by mouth daily.        . Probiotic Product (PROBIOTIC DAILY PO) Take by mouth daily.      . rosuvastatin (CRESTOR) 5 MG tablet Take 5 mg by mouth every Monday, Wednesday, and Friday.      . sitaGLIPtin (JANUVIA) 100 MG tablet Take 100 mg by mouth daily.      Marland Kitchen UNABLE TO FIND Apply 1 Device topically once. Cranial prosthesis r/t chemotherapy treatment  1 Device  0  . ciprofloxacin (CIPRO) 500 MG tablet Take 1 tablet (500 mg total) by mouth 2 (two) times daily.  14 tablet  6  . cyclobenzaprine (FLEXERIL) 10 MG tablet Take 10 mg by mouth 3 (three) times daily as needed for muscle spasms.      . ondansetron (ZOFRAN) 8  MG tablet Take 1 tablet (8 mg total) by mouth 2 (two) times daily as needed. Take two times a day as needed for nausea or vomiting starting on the third day after chemotherapy.  30 tablet  1  . pantoprazole (PROTONIX) 20 MG tablet Take 1 tablet (20 mg total) by mouth daily.  30 tablet  7  . prochlorperazine (COMPAZINE) 10 MG tablet Take 1 tablet (10 mg total) by mouth every 6 (six) hours as needed (Nausea or vomiting).  30 tablet  1  . prochlorperazine (COMPAZINE) 25 MG suppository Place 1 suppository (25 mg total) rectally every 12 (twelve) hours as needed for nausea.  12 suppository  3   No current facility-administered medications for this visit.    SURGICAL HISTORY:  Past Surgical History  Procedure Laterality Date  . Cesarean section  1981, 1983  .  Tummy tuck  1999  . Tonsillectomy    . Foot surgery    . Gastric bypass    . Wound debridement  01/14/2011    Procedure: DEBRIDEMENT ABDOMINAL WOUND;  Surgeon: Ernestene Mention, MD;  Location: [facility];  Service: General;  Laterality: N/A;  wound debridement and debridement on the abdomen  . Cosmetic surgery    . Total mastectomy Bilateral 05/14/2012    Procedure: TOTAL MASTECTOMY;  Surgeon: Ernestene Mention, MD;  Location: [facility] OR;  Service: General;  Laterality: Bilateral;  . Axillary sentinel node biopsy Left 05/14/2012    Procedure: AXILLARY SENTINEL NODE BIOPSY;  Surgeon: Ernestene Mention, MD;  Location: [facility] OR;  Service: General;  Laterality: Left;  . Portacath placement N/A 05/14/2012    Procedure: INSERTION PORT-A-CATH;  Surgeon: Ernestene Mention, MD;  Location: [facility] OR;  Service: General;  Laterality: N/A;  . Breast reconstruction with placement of tissue expander and flex hd (acellular hydrated dermis) Bilateral 05/14/2012    Procedure: BREAST RECONSTRUCTION WITH PLACEMENT OF TISSUE EXPANDER AND FLEX HD (ACELLULAR HYDRATED DERMIS);  Surgeon: Etter Sjogren, MD;  Location: [facility] OR;  Service: Plastics;  Laterality: Bilateral;  .  Bunionectomy      REVIEW OF SYSTEMS:  Pertinent items are noted in HPI.   HEALTH MAINTENANCE:   PHYSICAL EXAMINATION: Blood pressure 138/70, pulse 101, temperature 98.5 F (36.9 C), temperature source Oral, resp. rate 20, height 5\' 4"  (1.626 m), weight 187 lb 1.6 oz (84.868 kg). Body mass index is 32.1 kg/(m^2). ECOG PERFORMANCE STATUS: 0 - Asymptomatic Well-developed well-nourished very pleasant female in no acute distress HEENT exam EOMI PERRLA sclerae anicteric no conjunctival pallor oral mucosa is moist neck is supple lungs are clear cardiovascular regular rate rhythm abdomen soft nontender no HSM extremities no edema neuro is nonfocal Bilateral expanders in place mild tenderness     LABORATORY DATA: Lab Results  Component Value Date   WBC 8.0 08/13/2012   HGB 11.5* 08/13/2012   HCT 35.5 08/13/2012   MCV 90.1 08/13/2012   PLT 264 08/13/2012      Chemistry      Component Value Date/Time   NA 147* 08/13/2012 1022   NA 139 05/17/2012 0530   K 4.0 08/13/2012 1022   K 3.7 05/17/2012 0530   CL 104 07/30/2012 0955   CL 100 05/17/2012 0530   CO2 31* 08/13/2012 1022   CO2 33* 05/17/2012 0530   BUN 7.3 08/13/2012 1022   BUN 4* 05/17/2012 0530   CREATININE 0.7 08/13/2012 1022   CREATININE 0.56 05/17/2012 0530      Component Value Date/Time   CALCIUM 9.9 08/13/2012 1022   CALCIUM 8.7 05/17/2012 0530   ALKPHOS 116 08/13/2012 1022   ALKPHOS 103 05/10/2012 1440   AST 18 08/13/2012 1022   AST 25 05/10/2012 1440   ALT 20 08/13/2012 1022   ALT 17 05/10/2012 1440   BILITOT <0.20 Repeated and Verified 08/13/2012 1022   BILITOT 0.4 05/10/2012 1440    ADDITIONAL INFORMATION: 6. PROGNOSTIC INDICATORS - ACIS Results: IMMUNOHISTOCHEMICAL AND MORPHOMETRIC ANALYSIS BY THE AUTOMATED CELLULAR IMAGING SYSTEM (ACIS) Estrogen Receptor: 0%, NEGATIVE Progesterone Receptor: 0%, NEGATIVE Proliferation Marker Ki67: 92% COMMENT: The negative hormone receptor study(ies) in this case have an internal positive  control. REFERENCE RANGE ESTROGEN RECEPTOR NEGATIVE <1% POSITIVE =>1% PROGESTERONE RECEPTOR NEGATIVE <1% POSITIVE =>1% All controls stained appropriately Pecola Leisure MD Pathologist, Electronic Signature ( Signed 05/21/2012) 6. CHROMOGENIC IN-SITU HYBRIDIZATION Results: HER-2/NEU BY CISH - NO AMPLIFICATION  OF HER-2 DETECTED. 1 of 6 Duplicate copy FINAL for [place], [place] G [phone]) ADDITIONAL INFORMATION:(continued) RESULT RATIO OF HER2: CEP 17 SIGNALS 1.19 AVERAGE HER2 COPY NUMBER PER CELL 1.85 REFERENCE RANGE NEGATIVE HER2/Chr17 Ratio <2.0 and Average HER2 copy number <4.0 EQUIVOCAL HER2/Chr17 Ratio <2.0 and Average HER2 copy number 4.0 and <6.0 POSITIVE HER2/Chr17 Ratio >=2.0 and/or Average HER2 copy number >=6.0 Pecola Leisure MD Pathologist, Electronic Signature ( Signed 05/20/2012) FINAL DIAGNOSIS Diagnosis 1. Lymph node, sentinel, biopsy, Left axillary - THERE IS NO EVIDENCE OF CARCINOMA IN 1 OF 1 LYMPH NODE (0/1). 2. Lymph node, sentinel, biopsy, Left axillary - THERE IS NO EVIDENCE OF CARCINOMA IN 1 OF 1 LYMPH NODE (0/1). 3. Lymph node, sentinel, biopsy, Left axillary - THERE IS NO EVIDENCE OF CARCINOMA IN 1 OF 1 LYMPH NODE (0/1). 4. Lymph node, sentinel, biopsy, Left axillary - THERE IS NO EVIDENCE OF CARCINOMA IN 1 OF 1 LYMPH NODE (0/1). 5. Lymph node, sentinel, biopsy, Left axillary - THERE IS NO EVIDENCE OF CARCINOMA IN 1 OF 1 LYMPH NODE (0/1). 6. Breast, simple mastectomy, Right - INVASIVE DUCTAL CARCINOMA, GRADE III/III, SPANNING 1.2 CM. - DUCTAL CARCINOMA IN SITU, HIGH GRADE. - LYMPHOVASCULAR INVASION IS IDENTIFIED. - THERE IS NO EVIDENCE OF CARCINOMA IN 1 OF 1 LYMPH NODE (0/1). - SEE ONCOLOGY TABLE BELOW. 7. Breast, simple mastectomy, Left - INVASIVE DUCTAL CARCINOMA, AT LEAST THREE FOCI, GRADE II/III, SPANNING 1.4 CM, 1.2 CM, AND 0.1 CM. - DUCTAL CARCINOMA IN SITU, INTERMEDIATE GRADE. - LYMPHOVASCULAR INVASION IS IDENTIFIED. - DUCTAL CARCINOMA  IN SITU IS FOCALLY 0.2 CM TO THE ANTERIOR/INFERIOR SOFT TISSUE RESECTION MARGIN. - LOBULAR NEOPLASIA (LOBULAR CARCINOMA IN SITU). - THERE IS NO EVIDENCE OF CARCINOMA IN 2 OF 2 LYMPH NODES (0/2). - SEE ONCOLOGY TABLE BELOW. 8. Lymph node, biopsy, Left axillary - THERE IS NO EVIDENCE OF CARCINOMA IN 1 OF 1 LYMPH NODE (0/1). Microscopic Comment 6. BREAST, INVASIVE TUMOR, WITH LYMPH NODE SAMPLING Specimen, including laterality: Right breast Procedure: Simple mastectomy Grade: 3 2 of 6 Duplicate copy FINAL for [place], [place] G ([id number]) Microscopic Comment(continued) Tubule formation: 3 Nuclear pleomorphism: 3 Mitotic:2 Tumor size (gross measurement): 1.2 cm Margins: Invasive, distance to closest margin: 1.3 cm to the deep margin (gross measurement) In-situ, distance to closest margin: 1.3 cm to the deep margin (gross measurement) Lymphovascular invasion: Present Ductal carcinoma in situ: Present Grade: High grade Extensive intraductal component: Not identified Lobular neoplasia: Not identified in specimen #6 Tumor focality: Unifocal Treatment effect: N/A Extent of tumor: Confined to breast parenchyma Lymph nodes: # examined: 1 Lymph nodes with metastasis: 0 Breast prognostic profile: Will be performed on the current case and the results reported separately. TNM: pT1c, pN0 7. BREAST, INVASIVE TUMOR, WITH LYMPH NODE SAMPLING Specimen, including laterality: Left breast Procedure: Simple mastectomy Grade: 2 Tubule formation: 3 Nuclear pleomorphism: 2 Mitotic:2 Tumor size (gross measurements) 1.4 cm, 1.2 cm, and 0.1 cm Margins: Invasive, distance to closest margin: 0.7 cm to the anterior/inferior soft tissue resection margin In-situ, distance to closest margin: Focally 0.2 cm to the anterior/inferior soft tissue resection margin (glass slide measurement) Lymphovascular invasion: Present Ductal carcinoma in situ: Present Grade: Intermediate grade Extensive intraductal  component: Yes Lobular neoplasia: Yes (lobular carcinoma in situ) Tumor focality: Multiple foci Treatment effect: N/A Extent of tumor: Confined to breast parenchyma Lymph nodes: # examined: 8 Lymph nodes with metastasis: 0 Breast prognostic profile: [phone] Estrogen receptor: 100% strong staining intensity Progesterone receptor: 11% strong staining intensity Her 2 neu: Amplification was detected.  The ratio was 6.50. 3 of 6 Duplicate copy FINAL for [place], [place] G ([id number]) Microscopic Comment(continued) Ki-67: 80% TNM: mpT1c, pN0 Comments: In addition to the two grossly identified nodules, there is invasive carcinoma present in random tissue submitted from the inferior lateral quadrant. The three foci of carcinoma are morphologically similar.   RADIOGRAPHIC STUDIES:  Nm Pet Image Initial (pi) Skull Base To Thigh  05/12/2012  *RADIOLOGY REPORT*  Clinical Data: Initial treatment strategy for breast cancer.   NUCLEAR MEDICINE PET SKULL BASE TO THIGH  Fasting Blood Glucose:  143  Technique:  20 mCi F-18 FDG was injected intravenously. CT data was obtained and used for attenuation correction and anatomic localization only.  (This was not acquired as a diagnostic CT examination.) Additional exam technical data entered on technologist worksheet.  Comparison:  none  Findings:  Neck: No hypermetabolic lymph nodes in the neck.  Chest:  In the inferior medial aspect of the right breast there is a 10 mm nodule (image 99) with moderate  metabolic activity ( SUV max = 2.9).  No hypermetabolic mediastinal or hilar lymph nodes.  There is hypermetabolic activity in the thoracic inlet which is felt to be vascular in nature.  There is activity adjacent to the thoracic aorta which is also felt to be vascular may relate to the hemiazygos vein.  Abdomen/Pelvis:  No abnormal hypermetabolic activity within the liver, pancreas, adrenal glands, or spleen.  No hypermetabolic lymph nodes in the abdomen or  pelvis.  Skeleton:  No focal hypermetabolic activity to suggest skeletal metastasis.  IMPRESSION:  1.  No evidence of distant breast cancer metastasis. 2.  Hypermetabolic nodule within the inferior medial right breast   Original Report Authenticated By: Genevive Bi, M.D.    Nm Sentinel Node Inj-no Rpt (breast)  05/14/2012  CLINICAL DATA: cancer left breast   Sulfur colloid was injected intradermally by the nuclear medicine  technologist for breast cancer sentinel node localization.     Dg Chest Port 1 View  05/14/2012  *RADIOLOGY REPORT*  Clinical Data: Status post Port-A-Cath placement  PORTABLE CHEST - 1 VIEW  Comparison: None.  Findings: The cardiac shadow is within normal limits.  Bilateral tissue expanders as well as surgical drains are identified.  A right chest wall port is seen. The catheter tip is at the cavoatrial junction.  No pneumothorax is noted.  The lungs are clear bilaterally.  IMPRESSION: No evidence of pneumothorax following port placement.  Bilateral tissue expanders.   Original Report Authenticated By: Alcide Clever, M.D.    Dg Fluoro Guide Cv Line-no Report  05/14/2012  CLINICAL DATA: LEFT BREAST CANCER/PORT-A-CATH   FLOURO GUIDE CV LINE  Fluoroscopy was utilized by the requesting physician.  No radiographic  interpretation.      ASSESSMENT: 62 year old female with  #1 bilateral breast cancers on the right side she has triple-negative disease stage I left side stage I ER positive PR positive HER-2/neu positive disease. Patient is status post bilateral mastectomies. Of note on the right side the lymph node removed was not a sentinel lymph node. It is unclear what to do with this side in terms of whether or not to do further sentinel lymph node. Clinically patient seems to be doing well.  #2 we discussed her pathology in detail. We discussed chemotherapy to be given adjuvantly. We discussed Adriamycin Cytoxan given every 2 weeks for a total of 4 cycles followed by Taxol and  Herceptin given weekly x12 weeks followed by Herceptin every 3 weeks to finish  out a total of one year of therapy. We also discussed role of adjuvant hormonal therapy since the left side is ER positive.  #3 patient is now here (5/16) to begin cycle 1 day 1 of Adriamycin and Cytoxan to be given every 2 weeks for a total of 4 cycles. Patient is now status post 4 cycles of Adriamycin Cytoxan given from 07/02/2012 through 08/12/2012. Overall she tolerated the treatment well.  #4 patient will begin Taxol and Herceptin every week for a total of 12 weeks beginning 08/27/2012. Patient understands risks benefits and side effects.   PLAN:   #1 proceed with cycle 4 day 1 of Adriamycin and Cytoxan.  #2 after completion of 4 cycles of Adriamycin and Cytoxan she will begin weekly Taxol and Herceptin starting 08/27/2012.  All questions were answered. The patient knows to call the clinic with any problems, questions or concerns. We can certainly see the patient much sooner if necessary.  I spent 25 minutes counseling the patient face to face. The total time spent in the appointment was 30 minutes.    Drue Second, MD Medical/Oncology [facility] [phone] (beeper) [phone] (Office)  08/13/2012, 12:04 PM

## 2012-08-13 NOTE — Telephone Encounter (Signed)
Per staff message and POF I have scheduled appts.  JMW  

## 2012-08-13 NOTE — Telephone Encounter (Signed)
appts made and printed...td 

## 2012-08-14 ENCOUNTER — Ambulatory Visit (HOSPITAL_BASED_OUTPATIENT_CLINIC_OR_DEPARTMENT_OTHER): Payer: BC Managed Care – PPO

## 2012-08-14 VITALS — BP 145/87 | HR 89 | Temp 97.9°F | Resp 18

## 2012-08-14 DIAGNOSIS — Z5189 Encounter for other specified aftercare: Secondary | ICD-10-CM

## 2012-08-14 DIAGNOSIS — C50119 Malignant neoplasm of central portion of unspecified female breast: Secondary | ICD-10-CM

## 2012-08-14 DIAGNOSIS — C50919 Malignant neoplasm of unspecified site of unspecified female breast: Secondary | ICD-10-CM

## 2012-08-14 MED ORDER — PEGFILGRASTIM INJECTION 6 MG/0.6ML
6.0000 mg | Freq: Once | SUBCUTANEOUS | Status: AC
  Administered 2012-08-14: 6 mg via SUBCUTANEOUS

## 2012-08-15 NOTE — Progress Notes (Signed)
OFFICE PROGRESS NOTE  CC  [name], MD [address] [place] [address] Dr. 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 Patient is status post bilateral mastectomy with immediate reconstruction  STAGE:  Right breast (mastectomy)  T1cN0 1.2 cm ER-/PR-/her-/ki-67 92% 0/1 nodes positive  Left breast (mastectomy with snl) T1cN0 1.4 cm, 1.2 cm, 0.1c ER 100%, PR 11%, her2neu positive 6.50, Ki-67 80%   PRIOR THERAPY:  #1screening mammogram performed that showed 2 areas of concern on the left side.calcifications were noted in the left. In the right breast a possible mass warranted further evaluation.   #2 On 03/29/2012 patient underwent a bilateral diagnostic mammogram and right breast ultrasound. A spot magnification images demonstrate suspicious group of pleomorphic microcalcifications over the outer lower left breast with additional suspicious group of pleomorphic microcalcifications over the outer midportion of the left periareolar region. Spot compression images of the right breast demonstrate persistence of a 1 cm density at the edge of the film in the deep third of the right inner breast.   #3Ultrasound performed showed no focal abnormality over the entire in her right breast to correspond to the mammographic density. Patient was recommended stereotactic core needle biopsy of the 2 groups of suspicious left breast microcalcifications. Because patient and her husband wanted bilateral mastectomies MRI was not performed.on 04/15/2012 patient had needle core biopsy performed of the 2 areas in the left breast. The left needle core biopsy in the lower breast revealed ductal carcinoma in situ with associated comedo necrosis and calcifications with the in situ carcinoma. It was ER +100% PR +12%. 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 Tumor was ER +100% PR +11% proliferation marker Ki-67 elevated at 80% HER-2/neu showed amplification with a ratio of 6.50.   #4 patient is now status post bilateral mastectomies performed on 05/14/2012 with immediate reconstruction. The final pathology revealed bilateral breast cancers. In the right breast tissue is noted to have a 1.2 cm invasive ductal carcinoma high-grade ER negative PR negative HER-2/neu negative with Ki-67 92% one node was negative for metastatic disease but this was not the sentinel lymph node. On the left side the patient had multifocal disease measuring 1.4 cm, 1.2 cm, 0.1 cm. Tumor was ER +100% PR +11% HER-2/neu positive with a ratio 6.50. Ki-67 was 80% 8 sentinel nodes were negative for metastatic disease.  #5 patient has had immediate reconstruction with expanders bilaterally.  #6 patient will now begin adjuvant chemotherapy initially consisting of Adriamycin Cytoxan every 2 weeks for a total of 4 cycles. This will then be followed by Taxol and Herceptin to be given weekly for 12 weeks time.   CURRENT THERAPY: Status post cycle 2  of Adriamycin Cytoxan   INTERVAL HISTORY: [name] [name] 62 y.o. female returns for followup visit. Overall she is feeling well she is accompanied by her husband.Overall patient tolerating her chemotherapy well. She has not had any fevers chills or night sweats. She is weak tired and fatigued. She is trying to drink as much as she possibly can and hydration is obviously helping her. She does not have any peripheral paresthesias. She does note again being tired significantly. She has no pain. Remainder of the 10 point review of systems is negative.  MEDICAL HISTORY: Past Medical History  Diagnosis Date  . Heart murmur   . Diabetes mellitus without complication   . Osteopenia   . Cancer     breast  . Bronchitis   . Pneumonia   .  Hiatal hernia   . Sinus problem   . Diabetes mellitus   . Breast cancer 04/15/12    left-biopsy  . Breast cancer, right breast 05/14/12    right mastectomy    ALLERGIES:  is allergic to darvon and penicillins.  MEDICATIONS:  Current Outpatient Prescriptions  Medication Sig Dispense Refill  . Ascorbic Acid (VITAMIN C) 500 MG tablet Take 500 mg by mouth daily.        . Biotin 10 MG TABS Take by mouth. 4000 mcg daily      . calcitonin, salmon, (MIACALCIN/FORTICAL) 200 UNIT/ACT nasal spray Place 1 spray into the nose daily.      . Calcium Citrate (CITRACAL PO) Take by mouth.      . Cholecalciferol (VITAMIN D PO) Take 1 tablet by mouth daily.      Marland Kitchen dexamethasone (DECADRON) 4 MG tablet Take 2 tablets by mouth once a day on the day after chemotherapy and then take 2 tablets two times a day for 2 days. Take with food.  30 tablet  1  . KRILL OIL PO Take by mouth.      . lidocaine-prilocaine (EMLA) cream Apply topically as needed.  30 [name]  1  . Multiple Vitamin (MULTIVITAMIN PO) Take 1 tablet by mouth daily.       . pantoprazole (PROTONIX) 40 MG tablet Take 40 mg by mouth daily.        . Probiotic Product (PROBIOTIC DAILY PO) Take by mouth daily.      . rosuvastatin (CRESTOR) 5 MG tablet Take 5 mg by mouth every Monday, Wednesday, and Friday.      . sitaGLIPtin (JANUVIA) 100 MG tablet Take 100 mg by mouth daily.      Marland Kitchen UNABLE TO FIND Apply 1 Device topically once. Cranial prosthesis r/t chemotherapy treatment  1 Device  0  . ciprofloxacin (CIPRO) 500 MG tablet Take 1 tablet (500 mg total) by mouth 2 (two) times daily.  14 tablet  6  . cyclobenzaprine (FLEXERIL) 10 MG tablet Take 10 mg by mouth 3 (three) times daily as needed for muscle spasms.      Marland Kitchen docusate sodium 100 MG CAPS Take 100 mg by mouth 2 (two) times daily.  30 capsule  1  . LORazepam (ATIVAN) 0.5 MG tablet Take 1 tablet (0.5 mg total) by mouth every 6 (six) hours as needed (Nausea or vomiting).  30 tablet  0  . ondansetron (ZOFRAN) 8  MG tablet Take 1 tablet (8 mg total) by mouth 2 (two) times daily as needed. Take two times a day as needed for nausea or vomiting starting on the third day after chemotherapy.  30 tablet  1  . pantoprazole (PROTONIX) 20 MG tablet Take 1 tablet (20 mg total) by mouth daily.  30 tablet  7  . prochlorperazine (COMPAZINE) 10 MG tablet Take 1 tablet (10 mg total) by mouth every 6 (six) hours as needed (Nausea or vomiting).  30 tablet  1  . prochlorperazine (COMPAZINE) 25 MG suppository Place 1 suppository (25 mg total) rectally every 12 (twelve) hours as needed for nausea.  12 suppository  3   No current facility-administered medications for this visit.    SURGICAL HISTORY:  Past Surgical History  Procedure Laterality Date  . Cesarean  section  [id number], 1983  . Tummy tuck  1999  . Tonsillectomy    . Foot surgery    . Gastric bypass    . Wound debridement  01/14/2011    Procedure: DEBRIDEMENT ABDOMINAL WOUND;  Surgeon: Ernestene Mention, MD;  Location: [facility];  Service: General;  Laterality: N/A;  wound debridement and debridement on the abdomen  . Cosmetic surgery    . Total mastectomy Bilateral 05/14/2012    Procedure: TOTAL MASTECTOMY;  Surgeon: Ernestene Mention, MD;  Location: [facility] OR;  Service: General;  Laterality: Bilateral;  . Axillary sentinel node biopsy Left 05/14/2012    Procedure: AXILLARY SENTINEL NODE BIOPSY;  Surgeon: Ernestene Mention, MD;  Location: [facility] OR;  Service: General;  Laterality: Left;  . Portacath placement N/A 05/14/2012    Procedure: INSERTION PORT-A-CATH;  Surgeon: Ernestene Mention, MD;  Location: [facility] OR;  Service: General;  Laterality: N/A;  . Breast reconstruction with placement of tissue expander and flex hd (acellular hydrated dermis) Bilateral 05/14/2012    Procedure: BREAST RECONSTRUCTION WITH PLACEMENT OF TISSUE EXPANDER AND FLEX HD (ACELLULAR HYDRATED DERMIS);  Surgeon: Etter Sjogren, MD;  Location: [facility] OR;  Service: Plastics;  Laterality: Bilateral;  .  Bunionectomy      REVIEW OF SYSTEMS:  Pertinent items are noted in HPI.   HEALTH MAINTENANCE:   PHYSICAL EXAMINATION: Blood pressure 146/81, pulse 94, temperature 98.6 F (37 C), temperature source Oral, resp. rate 20, height 5\' 4"  (1.626 m), weight 185 lb 14.4 oz (84.324 kg). Body mass index is 31.89 kg/(m^2). ECOG PERFORMANCE STATUS: 0 - Asymptomatic Well-developed well-nourished very pleasant female in no acute distress HEENT exam EOMI PERRLA sclerae anicteric no conjunctival pallor oral mucosa is moist neck is supple lungs are clear cardiovascular regular rate rhythm abdomen soft nontender no HSM extremities no edema neuro is nonfocal Bilateral expanders in place mild tenderness     LABORATORY DATA: Lab Results  Component Value Date   WBC 8.0 08/13/2012   HGB 11.5* 08/13/2012   HCT 35.5 08/13/2012   MCV 90.1 08/13/2012   PLT 264 08/13/2012      Chemistry      Component Value Date/Time   NA 147* 08/13/2012 1022   NA 139 05/17/2012 0530   K 4.0 08/13/2012 1022   K 3.7 05/17/2012 0530   CL 104 07/30/2012 0955   CL 100 05/17/2012 0530   CO2 31* 08/13/2012 1022   CO2 33* 05/17/2012 0530   BUN 7.3 08/13/2012 1022   BUN 4* 05/17/2012 0530   CREATININE 0.7 08/13/2012 1022   CREATININE 0.56 05/17/2012 0530      Component Value Date/Time   CALCIUM 9.9 08/13/2012 1022   CALCIUM 8.7 05/17/2012 0530   ALKPHOS 116 08/13/2012 1022   ALKPHOS 103 05/10/2012 1440   AST 18 08/13/2012 1022   AST 25 05/10/2012 1440   ALT 20 08/13/2012 1022   ALT 17 05/10/2012 1440   BILITOT <0.20 Repeated and Verified 08/13/2012 1022   BILITOT 0.4 05/10/2012 1440    ADDITIONAL INFORMATION: 6. PROGNOSTIC INDICATORS - ACIS Results: IMMUNOHISTOCHEMICAL AND MORPHOMETRIC ANALYSIS BY THE AUTOMATED CELLULAR IMAGING SYSTEM (ACIS) Estrogen Receptor: 0%, NEGATIVE Progesterone Receptor: 0%, NEGATIVE Proliferation Marker Ki67: 92% COMMENT: The negative hormone receptor study(ies) in this case have an internal positive  control. REFERENCE RANGE ESTROGEN RECEPTOR NEGATIVE <1% POSITIVE =>1% PROGESTERONE RECEPTOR NEGATIVE <1% POSITIVE =>1% All controls stained appropriately Pecola Leisure MD Pathologist, Electronic Signature ( Signed 05/21/2012) 6. CHROMOGENIC IN-SITU HYBRIDIZATION Results:  HER-2/NEU BY CISH - NO AMPLIFICATION OF HER-2 DETECTED. 1 of 6 Duplicate copy FINAL for Dula, [name] [name] [phone]) ADDITIONAL INFORMATION:(continued) RESULT RATIO OF HER2: CEP 17 SIGNALS 1.19 AVERAGE HER2 COPY NUMBER PER CELL 1.85 REFERENCE RANGE NEGATIVE HER2/Chr17 Ratio <2.0 and Average HER2 copy number <4.0 EQUIVOCAL HER2/Chr17 Ratio <2.0 and Average HER2 copy number 4.0 and <6.0 POSITIVE HER2/Chr17 Ratio >=2.0 and/or Average HER2 copy number >=6.0 Pecola Leisure MD Pathologist, Electronic Signature ( Signed 05/20/2012) FINAL DIAGNOSIS Diagnosis 1. Lymph node, sentinel, biopsy, Left axillary - THERE IS NO EVIDENCE OF CARCINOMA IN 1 OF 1 LYMPH NODE (0/1). 2. Lymph node, sentinel, biopsy, Left axillary - THERE IS NO EVIDENCE OF CARCINOMA IN 1 OF 1 LYMPH NODE (0/1). 3. Lymph node, sentinel, biopsy, Left axillary - THERE IS NO EVIDENCE OF CARCINOMA IN 1 OF 1 LYMPH NODE (0/1). 4. Lymph node, sentinel, biopsy, Left axillary - THERE IS NO EVIDENCE OF CARCINOMA IN 1 OF 1 LYMPH NODE (0/1). 5. Lymph node, sentinel, biopsy, Left axillary - THERE IS NO EVIDENCE OF CARCINOMA IN 1 OF 1 LYMPH NODE (0/1). 6. Breast, simple mastectomy, Right - INVASIVE DUCTAL CARCINOMA, GRADE III/III, SPANNING 1.2 CM. - DUCTAL CARCINOMA IN SITU, HIGH GRADE. - LYMPHOVASCULAR INVASION IS IDENTIFIED. - THERE IS NO EVIDENCE OF CARCINOMA IN 1 OF 1 LYMPH NODE (0/1). - SEE ONCOLOGY TABLE BELOW. 7. Breast, simple mastectomy, Left - INVASIVE DUCTAL CARCINOMA, AT LEAST THREE FOCI, GRADE II/III, SPANNING 1.4 CM, 1.2 CM, AND 0.1 CM. - DUCTAL CARCINOMA IN SITU, INTERMEDIATE GRADE. - LYMPHOVASCULAR INVASION IS IDENTIFIED. - DUCTAL CARCINOMA  IN SITU IS FOCALLY 0.2 CM TO THE ANTERIOR/INFERIOR SOFT TISSUE RESECTION MARGIN. - LOBULAR NEOPLASIA (LOBULAR CARCINOMA IN SITU). - THERE IS NO EVIDENCE OF CARCINOMA IN 2 OF 2 LYMPH NODES (0/2). - SEE ONCOLOGY TABLE BELOW. 8. Lymph node, biopsy, Left axillary - THERE IS NO EVIDENCE OF CARCINOMA IN 1 OF 1 LYMPH NODE (0/1). Microscopic Comment 6. BREAST, INVASIVE TUMOR, WITH LYMPH NODE SAMPLING Specimen, including laterality: Right breast Procedure: Simple mastectomy Grade: 3 2 of 6 Duplicate copy FINAL for Lozano, [name] [name] ([id number]) Microscopic Comment(continued) Tubule formation: 3 Nuclear pleomorphism: 3 Mitotic:2 Tumor size (gross measurement): 1.2 cm Margins: Invasive, distance to closest margin: 1.3 cm to the deep margin (gross measurement) In-situ, distance to closest margin: 1.3 cm to the deep margin (gross measurement) Lymphovascular invasion: Present Ductal carcinoma in situ: Present Grade: High grade Extensive intraductal component: Not identified Lobular neoplasia: Not identified in specimen #6 Tumor focality: Unifocal Treatment effect: N/A Extent of tumor: Confined to breast parenchyma Lymph nodes: # examined: 1 Lymph nodes with metastasis: 0 Breast prognostic profile: Will be performed on the current case and the results reported separately. TNM: pT1c, pN0 7. BREAST, INVASIVE TUMOR, WITH LYMPH NODE SAMPLING Specimen, including laterality: Left breast Procedure: Simple mastectomy Grade: 2 Tubule formation: 3 Nuclear pleomorphism: 2 Mitotic:2 Tumor size (gross measurements) 1.4 cm, 1.2 cm, and 0.1 cm Margins: Invasive, distance to closest margin: 0.7 cm to the anterior/inferior soft tissue resection margin In-situ, distance to closest margin: Focally 0.2 cm to the anterior/inferior soft tissue resection margin (glass slide measurement) Lymphovascular invasion: Present Ductal carcinoma in situ: Present Grade: Intermediate grade Extensive intraductal  component: Yes Lobular neoplasia: Yes (lobular carcinoma in situ) Tumor focality: Multiple foci Treatment effect: N/A Extent of tumor: Confined to breast parenchyma Lymph nodes: # examined: 8 Lymph nodes with metastasis: 0 Breast prognostic profile: [phone] Estrogen receptor: 100% strong staining intensity Progesterone receptor: 11% strong staining intensity  Her 2 neu: Amplification was detected. The ratio was 6.50. 3 of 6 Duplicate copy FINAL for Luu, [name] [name] ([id number]) Microscopic Comment(continued) Ki-67: 80% TNM: mpT1c, pN0 Comments: In addition to the two grossly identified nodules, there is invasive carcinoma present in random tissue submitted from the inferior lateral quadrant. The three foci of carcinoma are morphologically similar.   RADIOGRAPHIC STUDIES:  Nm Pet Image Initial (pi) Skull Base To Thigh  05/12/2012  *RADIOLOGY REPORT*  Clinical Data: Initial treatment strategy for breast cancer.   NUCLEAR MEDICINE PET SKULL BASE TO THIGH  Fasting Blood Glucose:  143  Technique:  20 mCi F-18 FDG was injected intravenously. CT data was obtained and used for attenuation correction and anatomic localization only.  (This was not acquired as a diagnostic CT examination.) Additional exam technical data entered on technologist worksheet.  Comparison:  none  Findings:  Neck: No hypermetabolic lymph nodes in the neck.  Chest:  In the inferior medial aspect of the right breast there is a 10 mm nodule (image 99) with moderate  metabolic activity ( SUV max = 2.9).  No hypermetabolic mediastinal or hilar lymph nodes.  There is hypermetabolic activity in the thoracic inlet which is felt to be vascular in nature.  There is activity adjacent to the thoracic aorta which is also felt to be vascular may relate to the hemiazygos vein.  Abdomen/Pelvis:  No abnormal hypermetabolic activity within the liver, pancreas, adrenal glands, or spleen.  No hypermetabolic lymph nodes in the abdomen or  pelvis.  Skeleton:  No focal hypermetabolic activity to suggest skeletal metastasis.  IMPRESSION:  1.  No evidence of distant breast cancer metastasis. 2.  Hypermetabolic nodule within the inferior medial right breast   Original Report Authenticated By: Genevive Bi, M.D.    Nm Sentinel Node Inj-no Rpt (breast)  05/14/2012  CLINICAL DATA: cancer left breast   Sulfur colloid was injected intradermally by the nuclear medicine  technologist for breast cancer sentinel node localization.     Dg Chest Port 1 View  05/14/2012  *RADIOLOGY REPORT*  Clinical Data: Status post Port-A-Cath placement  PORTABLE CHEST - 1 VIEW  Comparison: None.  Findings: The cardiac shadow is within normal limits.  Bilateral tissue expanders as well as surgical drains are identified.  A right chest wall port is seen. The catheter tip is at the cavoatrial junction.  No pneumothorax is noted.  The lungs are clear bilaterally.  IMPRESSION: No evidence of pneumothorax following port placement.  Bilateral tissue expanders.   Original Report Authenticated By: Alcide Clever, M.D.    Dg Fluoro Guide Cv Line-no Report  05/14/2012  CLINICAL DATA: LEFT BREAST CANCER/PORT-A-CATH   FLOURO GUIDE CV LINE  Fluoroscopy was utilized by the requesting physician.  No radiographic  interpretation.      ASSESSMENT: 62 year old female with  #1 bilateral breast cancers on the right side she has triple-negative disease stage I left side stage I ER positive PR positive HER-2/neu positive disease. Patient is status post bilateral mastectomies. Of note on the right side the lymph node removed was not a sentinel lymph node. It is unclear what to do with this side in terms of whether or not to do further sentinel lymph node. Clinically patient seems to be doing well.  #2 we discussed her pathology in detail. We discussed chemotherapy to be given adjuvantly. We discussed Adriamycin Cytoxan given every 2 weeks for a total of 4 cycles followed by Taxol and  Herceptin given weekly x12 weeks followed by  Herceptin every 3 weeks to finish out a total of one year of therapy. We also discussed role of adjuvant hormonal therapy since the left side is ER positive.  #3 patient is status post cycle 1 of Adriamycin and Cytoxan given on 07/02/2012. She tolerated this well. Status post cycle 2 of Adriamycin and Cytoxan  #4 leukopenia secondary to chemotherapy  #5 fatigue secondary to chemotherapy  PLAN:   #1Doing well, she is tolerating chemotherapy very well. 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  #2 I will see you back in 1 week for cycle 3 of AC   All questions were answered. The patient knows to call the clinic with any problems, questions or concerns. We can certainly see the patient much sooner if necessary.  I spent 25 minutes counseling the patient face to face. The total time spent in the appointment was 30 minutes.    Drue Second, MD Medical/Oncology [facility] [phone] (beeper) [phone] (Office)

## 2012-08-24 ENCOUNTER — Other Ambulatory Visit: Payer: Self-pay | Admitting: *Deleted

## 2012-08-24 DIAGNOSIS — C50919 Malignant neoplasm of unspecified site of unspecified female breast: Secondary | ICD-10-CM

## 2012-08-24 MED ORDER — LIDOCAINE-PRILOCAINE 2.5-2.5 % EX CREA: TOPICAL_CREAM | CUTANEOUS | Status: DC | PRN

## 2012-08-27 ENCOUNTER — Ambulatory Visit (HOSPITAL_BASED_OUTPATIENT_CLINIC_OR_DEPARTMENT_OTHER): Payer: BC Managed Care – PPO | Admitting: Oncology

## 2012-08-27 ENCOUNTER — Other Ambulatory Visit (HOSPITAL_BASED_OUTPATIENT_CLINIC_OR_DEPARTMENT_OTHER): Payer: BC Managed Care – PPO | Admitting: Lab

## 2012-08-27 ENCOUNTER — Other Ambulatory Visit: Payer: BC Managed Care – PPO | Admitting: Lab

## 2012-08-27 ENCOUNTER — Encounter: Payer: Self-pay | Admitting: Oncology

## 2012-08-27 ENCOUNTER — Ambulatory Visit: Payer: BC Managed Care – PPO | Admitting: Oncology

## 2012-08-27 ENCOUNTER — Ambulatory Visit (HOSPITAL_BASED_OUTPATIENT_CLINIC_OR_DEPARTMENT_OTHER): Payer: BC Managed Care – PPO

## 2012-08-27 VITALS — BP 146/80 | HR 102 | Temp 98.4°F | Resp 20 | Ht 64.0 in | Wt 185.1 lb

## 2012-08-27 VITALS — BP 136/67 | HR 86 | Temp 98.9°F | Resp 16

## 2012-08-27 DIAGNOSIS — Z170 Estrogen receptor positive status [ER+]: Secondary | ICD-10-CM

## 2012-08-27 DIAGNOSIS — D0590 Unspecified type of carcinoma in situ of unspecified breast: Secondary | ICD-10-CM

## 2012-08-27 DIAGNOSIS — C50912 Malignant neoplasm of unspecified site of left female breast: Secondary | ICD-10-CM

## 2012-08-27 DIAGNOSIS — Z5112 Encounter for antineoplastic immunotherapy: Secondary | ICD-10-CM

## 2012-08-27 DIAGNOSIS — C50911 Malignant neoplasm of unspecified site of right female breast: Secondary | ICD-10-CM

## 2012-08-27 DIAGNOSIS — C50119 Malignant neoplasm of central portion of unspecified female breast: Secondary | ICD-10-CM

## 2012-08-27 DIAGNOSIS — Z171 Estrogen receptor negative status [ER-]: Secondary | ICD-10-CM

## 2012-08-27 LAB — CBC WITH DIFFERENTIAL/PLATELET
BASO%: 0.9 % (ref 0.0–2.0)
Basophils Absolute: 0.1 10*3/uL (ref 0.0–0.1)
EOS%: 0.1 % (ref 0.0–7.0)
Eosinophils Absolute: 0 10*3/uL (ref 0.0–0.5)
HCT: 35.2 % (ref 34.8–46.6)
HGB: 11.6 g/dL (ref 11.6–15.9)
LYMPH%: 10.6 % — ABNORMAL LOW (ref 14.0–49.7)
MCH: 29.2 pg (ref 25.1–34.0)
MCHC: 33 g/dL (ref 31.5–36.0)
MCV: 88.7 fL (ref 79.5–101.0)
MONO#: 1.4 10*3/uL — ABNORMAL HIGH (ref 0.1–0.9)
MONO%: 12.8 % (ref 0.0–14.0)
NEUT#: 8.3 10*3/uL — ABNORMAL HIGH (ref 1.5–6.5)
NEUT%: 75.6 % (ref 38.4–76.8)
Platelets: 226 10*3/uL (ref 145–400)
RBC: 3.97 10*6/uL (ref 3.70–5.45)
RDW: 16.2 % — ABNORMAL HIGH (ref 11.2–14.5)
WBC: 11 10*3/uL — ABNORMAL HIGH (ref 3.9–10.3)
lymph#: 1.2 10*3/uL (ref 0.9–3.3)

## 2012-08-27 LAB — COMPREHENSIVE METABOLIC PANEL (CC13)
ALT: 24 U/L (ref 0–55)
AST: 18 U/L (ref 5–34)
Albumin: 3.5 g/dL (ref 3.5–5.0)
Alkaline Phosphatase: 123 U/L (ref 40–150)
BUN: 6.3 mg/dL — ABNORMAL LOW (ref 7.0–26.0)
CO2: 27 mEq/L (ref 22–29)
Calcium: 9.8 mg/dL (ref 8.4–10.4)
Chloride: 105 mEq/L (ref 98–109)
Creatinine: 0.7 mg/dL (ref 0.6–1.1)
Glucose: 147 mg/dl — ABNORMAL HIGH (ref 70–140)
Potassium: 3.3 mEq/L — ABNORMAL LOW (ref 3.5–5.1)
Sodium: 143 mEq/L (ref 136–145)
Total Bilirubin: <0.2 mg/dL (ref 0.20–1.20)
Total Protein: 6.5 g/dL (ref 6.4–8.3)

## 2012-08-27 MED ORDER — ONDANSETRON 8 MG/50ML IVPB (CHCC)
8.0000 mg | Freq: Once | INTRAVENOUS | Status: AC
  Administered 2012-08-27: 8 mg via INTRAVENOUS

## 2012-08-27 MED ORDER — SODIUM CHLORIDE 0.9 % IV SOLN
80.0000 mg/m2 | Freq: Once | INTRAVENOUS | Status: AC
  Administered 2012-08-27: 156 mg via INTRAVENOUS
  Filled 2012-08-27: qty 26

## 2012-08-27 MED ORDER — SODIUM CHLORIDE 0.9 % IJ SOLN
10.0000 mL | INTRAMUSCULAR | Status: DC | PRN
  Administered 2012-08-27: 10 mL
  Filled 2012-08-27: qty 10

## 2012-08-27 MED ORDER — PROCHLORPERAZINE MALEATE 10 MG PO TABS: 10.0000 mg | ORAL_TABLET | Freq: Four times a day (QID) | ORAL | Status: DC | PRN

## 2012-08-27 MED ORDER — RANITIDINE HCL 50 MG/2ML IJ SOLN
50.0000 mg | Freq: Once | INTRAVENOUS | Status: AC
  Administered 2012-08-27: 50 mg via INTRAVENOUS
  Filled 2012-08-27: qty 2

## 2012-08-27 MED ORDER — DEXAMETHASONE 4 MG PO TABS: 8.0000 mg | ORAL_TABLET | Freq: Two times a day (BID) | ORAL | Status: DC

## 2012-08-27 MED ORDER — ACETAMINOPHEN 325 MG PO TABS
650.0000 mg | ORAL_TABLET | Freq: Once | ORAL | Status: AC
  Administered 2012-08-27: 650 mg via ORAL

## 2012-08-27 MED ORDER — TRASTUZUMAB CHEMO INJECTION 440 MG
4.0000 mg/kg | Freq: Once | INTRAVENOUS | Status: AC
  Administered 2012-08-27: 336 mg via INTRAVENOUS
  Filled 2012-08-27: qty 16

## 2012-08-27 MED ORDER — SODIUM CHLORIDE 0.9 % IV SOLN
Freq: Once | INTRAVENOUS | Status: AC
  Administered 2012-08-27: 10:00:00 via INTRAVENOUS

## 2012-08-27 MED ORDER — HEPARIN SOD (PORK) LOCK FLUSH 100 UNIT/ML IV SOLN
500.0000 [IU] | Freq: Once | INTRAVENOUS | Status: AC | PRN
  Administered 2012-08-27: 500 [IU]
  Filled 2012-08-27: qty 5

## 2012-08-27 MED ORDER — DIPHENHYDRAMINE HCL 50 MG/ML IJ SOLN
50.0000 mg | Freq: Once | INTRAMUSCULAR | Status: AC
  Administered 2012-08-27: 50 mg via INTRAVENOUS

## 2012-08-27 MED ORDER — ONDANSETRON HCL 8 MG PO TABS: 8.0000 mg | ORAL_TABLET | Freq: Two times a day (BID) | ORAL | Status: DC

## 2012-08-27 MED ORDER — DEXAMETHASONE SODIUM PHOSPHATE 20 MG/5ML IJ SOLN
20.0000 mg | Freq: Once | INTRAMUSCULAR | Status: AC
  Administered 2012-08-27: 20 mg via INTRAVENOUS

## 2012-08-27 MED ORDER — LORAZEPAM 0.5 MG PO TABS: 0.5000 mg | ORAL_TABLET | Freq: Four times a day (QID) | ORAL | Status: DC | PRN

## 2012-08-27 MED ORDER — FAMOTIDINE IN NACL 20-0.9 MG/50ML-% IV SOLN: 20.0000 mg | Freq: Once | INTRAVENOUS | Status: DC

## 2012-08-27 NOTE — Patient Instructions (Addendum)
[facility] Discharge Instructions for Patients Receiving Chemotherapy  Today you received the following chemotherapy agents Taxol/Herceptin.  To help prevent nausea and vomiting after your treatment, we encourage you to take your nausea medication as prescribed.   If you develop nausea and vomiting that is not controlled by your nausea medication, call the clinic.   BELOW ARE SYMPTOMS THAT SHOULD BE REPORTED IMMEDIATELY:  *FEVER GREATER THAN 100.5 F  *CHILLS WITH OR WITHOUT FEVER  NAUSEA AND VOMITING THAT IS NOT CONTROLLED WITH YOUR NAUSEA MEDICATION  *UNUSUAL SHORTNESS OF BREATH  *UNUSUAL BRUISING OR BLEEDING  TENDERNESS IN MOUTH AND THROAT WITH OR WITHOUT PRESENCE OF ULCERS  *URINARY PROBLEMS  *BOWEL PROBLEMS  UNUSUAL RASH Items with * indicate a potential emergency and should be followed up as soon as possible.  Feel free to call the clinic you have any questions or concerns. The clinic phone number is [phone].    

## 2012-08-27 NOTE — Patient Instructions (Addendum)
#  1 we will proceed with cycle 1 of Taxol and Herceptin. We discussed the risks and benefits of treatment.  #2 I have recommended he proceed with super B complex to help prevent tingling associated with Taxol.  #3 I will also be in touch with Dr. Garnette Scheuermann regarding cardiac followup every 3 months. We will be doing echocardiograms every 3 months since Herceptin can cause cardiac issues.  #4 I will see you back in one week's time for cycle #2 of Taxol and Herceptin.

## 2012-08-27 NOTE — Progress Notes (Signed)
OFFICE PROGRESS NOTE  CC  [name], MD [address] [place] [address] Dr. 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 Patient is status post bilateral mastectomy with immediate reconstruction  STAGE:  Right breast (mastectomy)  T1cN0 1.2 cm ER-/PR-/her-/ki-67 92% 0/1 nodes positive  Left breast (mastectomy with snl) T1cN0 1.4 cm, 1.2 cm, 0.1c ER 100%, PR 11%, her2neu positive 6.50, Ki-67 80%   PRIOR THERAPY:  #1screening mammogram performed that showed 2 areas of concern on the left side.calcifications were noted in the left. In the right breast a possible mass warranted further evaluation.   #2 On 03/29/2012 patient underwent a bilateral diagnostic mammogram and right breast ultrasound. A spot magnification images demonstrate suspicious group of pleomorphic microcalcifications over the outer lower left breast with additional suspicious group of pleomorphic microcalcifications over the outer midportion of the left periareolar region. Spot compression images of the right breast demonstrate persistence of a 1 cm density at the edge of the film in the deep third of the right inner breast.   #3Ultrasound performed showed no focal abnormality over the entire in her right breast to correspond to the mammographic density. Patient was recommended stereotactic core needle biopsy of the 2 groups of suspicious left breast microcalcifications. Because patient and her husband wanted bilateral mastectomies MRI was not performed.on 04/15/2012 patient had needle core biopsy performed of the 2 areas in the left breast. The left needle core biopsy in the lower breast revealed ductal carcinoma in situ with associated comedo necrosis and calcifications with the in situ carcinoma. It was ER +100% PR +12%. 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 Tumor was ER +100% PR +11% proliferation marker Ki-67 elevated at 80% HER-2/neu showed amplification with a ratio of 6.50.   #4 patient is now status post bilateral mastectomies performed on 05/14/2012 with immediate reconstruction. The final pathology revealed bilateral breast cancers. In the right breast tissue is noted to have a 1.2 cm invasive ductal carcinoma high-grade ER negative PR negative HER-2/neu negative with Ki-67 92% one node was negative for metastatic disease but this was not the sentinel lymph node. On the left side the patient had multifocal disease measuring 1.4 cm, 1.2 cm, 0.1 cm. Tumor was ER +100% PR +11% HER-2/neu positive with a ratio 6.50. Ki-67 was 80% 8 sentinel nodes were negative for metastatic disease.  #5 patient has had immediate reconstruction with expanders bilaterally.  #6s/p adjuvant chemotherapy initially consisting of Adriamycin Cytoxan every 2 weeks for a total of 4 cycles. Given 07/02/12 - 08/12/12  #7This will then be followed by Taxol and Herceptin to be given weekly for 12 weeks time.beginning 08/27/12   CURRENT THERAPY: cycle 1 of taxol herceptin  INTERVAL HISTORY: [name] [name] 62 y.o. female returns for followup visit. Overall she is feeling well she is accompanied by her husband. She is ready to get started on her chemotherapy.she returned from her beach trip. She denied having any problems there. She is well rested. She denies any fevers chills night sweats headaches. She has no peripheral paresthesias no myalgias and arthralgias no shortness of breath cough hemoptysis hematemesis no chest pains. Remainder of the 10 point review of systems is negative.  MEDICAL HISTORY: Past Medical History  Diagnosis Date  . Heart murmur   .  Diabetes mellitus without complication   . Osteopenia   . Cancer     breast  . Bronchitis   . Pneumonia   .  Hiatal hernia   . Sinus problem   . Diabetes mellitus   . Breast cancer 04/15/12    left-biopsy  . Breast cancer, right breast 05/14/12    right mastectomy    ALLERGIES:  is allergic to darvon and penicillins.  MEDICATIONS:  Current Outpatient Prescriptions  Medication Sig Dispense Refill  . Ascorbic Acid (VITAMIN C) 500 MG tablet Take 500 mg by mouth daily.        . Biotin 10 MG TABS Take by mouth. 4000 mcg daily      . calcitonin, salmon, (MIACALCIN/FORTICAL) 200 UNIT/ACT nasal spray Place 1 spray into the nose daily.      . Calcium Citrate (CITRACAL PO) Take by mouth.      . Cholecalciferol (VITAMIN D PO) Take 1 tablet by mouth daily.      Marland Kitchen docusate sodium 100 MG CAPS Take 100 mg by mouth 2 (two) times daily.  30 capsule  1  . KRILL OIL PO Take by mouth.      . lidocaine-prilocaine (EMLA) cream Apply topically as needed.  30 [name]  1  . Multiple Vitamin (MULTIVITAMIN PO) Take 1 tablet by mouth daily.       . pantoprazole (PROTONIX) 40 MG tablet Take 40 mg by mouth daily.        . Probiotic Product (PROBIOTIC DAILY PO) Take by mouth daily.      . rosuvastatin (CRESTOR) 5 MG tablet Take 5 mg by mouth every Monday, Wednesday, and Friday.      . sitaGLIPtin (JANUVIA) 100 MG tablet Take 100 mg by mouth daily.      Marland Kitchen UNABLE TO FIND Apply 1 Device topically once. Cranial prosthesis r/t chemotherapy treatment  1 Device  0  . ciprofloxacin (CIPRO) 500 MG tablet Take 1 tablet (500 mg total) by mouth 2 (two) times daily.  14 tablet  6  . cyclobenzaprine (FLEXERIL) 10 MG tablet Take 10 mg by mouth 3 (three) times daily as needed for muscle spasms.      Marland Kitchen dexamethasone (DECADRON) 4 MG tablet Take 2 tablets (8 mg total) by mouth 2 (two) times daily with a meal. Take daily starting the day after chemotherapy for 2 days. Take with food.  30 tablet  1  . LORazepam (ATIVAN) 0.5 MG tablet Take 1 tablet (0.5 mg total) by mouth every 6 (six) hours as needed (Nausea or vomiting).  30 tablet  0  . ondansetron  (ZOFRAN) 8 MG tablet Take 1 tablet (8 mg total) by mouth 2 (two) times daily. Take two times a day starting the day after chemo for 2 days. Then take two times a day as needed for nausea or vomiting.  30 tablet  1  . prochlorperazine (COMPAZINE) 10 MG tablet Take 1 tablet (10 mg total) by mouth every 6 (six) hours as needed (Nausea or vomiting).  30 tablet  1   No current facility-administered medications for this visit.    SURGICAL HISTORY:  Past Surgical History  Procedure Laterality Date  . Cesarean section  1981, 1983  . Tummy tuck  1999  . Tonsillectomy    . Foot surgery    . Gastric bypass    . Wound debridement  01/14/2011    Procedure: DEBRIDEMENT ABDOMINAL WOUND;  Surgeon: Ernestene Mention, MD;  Location: [facility];  Service: General;  Laterality: N/A;  wound debridement and debridement on the abdomen  . Cosmetic surgery    . Total mastectomy Bilateral 05/14/2012    Procedure: TOTAL MASTECTOMY;  Surgeon: Ernestene Mention, MD;  Location: [facility] OR;  Service: General;  Laterality: Bilateral;  . Axillary sentinel node biopsy Left 05/14/2012    Procedure: AXILLARY SENTINEL NODE BIOPSY;  Surgeon: Ernestene Mention, MD;  Location: [facility] OR;  Service: General;  Laterality: Left;  . Portacath placement N/A 05/14/2012    Procedure: INSERTION PORT-A-CATH;  Surgeon: Ernestene Mention, MD;  Location: [facility] OR;  Service: General;  Laterality: N/A;  . Breast reconstruction with placement of tissue expander and flex hd (acellular hydrated dermis) Bilateral 05/14/2012    Procedure: BREAST RECONSTRUCTION WITH PLACEMENT OF TISSUE EXPANDER AND FLEX HD (ACELLULAR HYDRATED DERMIS);  Surgeon: Etter Sjogren, MD;  Location: [facility] OR;  Service: Plastics;  Laterality: Bilateral;  . Bunionectomy      REVIEW OF SYSTEMS:  Pertinent items are noted in HPI.   HEALTH MAINTENANCE:   PHYSICAL EXAMINATION: Blood pressure 146/80, pulse 102, temperature 98.4 F (36.9 C), temperature source Oral, resp. rate 20,  height 5\' 4"  (1.626 m), weight 185 lb 1.6 oz (83.961 kg). Body mass index is 31.76 kg/(m^2). ECOG PERFORMANCE STATUS: 0 - Asymptomatic Well-developed well-nourished very pleasant female in no acute distress HEENT exam EOMI PERRLA sclerae anicteric no conjunctival pallor oral mucosa is moist neck is supple lungs are clear cardiovascular regular rate rhythm abdomen soft nontender no HSM extremities no edema neuro is nonfocal Bilateral expanders in place mild tenderness     LABORATORY DATA: Lab Results  Component Value Date   WBC 11.0* 08/27/2012   HGB 11.6 08/27/2012   HCT 35.2 08/27/2012   MCV 88.7 08/27/2012   PLT 226 08/27/2012      Chemistry      Component Value Date/Time   NA 147* 08/13/2012 1022   NA 139 05/17/2012 0530   K 4.0 08/13/2012 1022   K 3.7 05/17/2012 0530   CL 104 07/30/2012 0955   CL 100 05/17/2012 0530   CO2 31* 08/13/2012 1022   CO2 33* 05/17/2012 0530   BUN 7.3 08/13/2012 1022   BUN 4* 05/17/2012 0530   CREATININE 0.7 08/13/2012 1022   CREATININE 0.56 05/17/2012 0530      Component Value Date/Time   CALCIUM 9.9 08/13/2012 1022   CALCIUM 8.7 05/17/2012 0530   ALKPHOS 116 08/13/2012 1022   ALKPHOS 103 05/10/2012 1440   AST 18 08/13/2012 1022   AST 25 05/10/2012 1440   ALT 20 08/13/2012 1022   ALT 17 05/10/2012 1440   BILITOT <0.20 Repeated and Verified 08/13/2012 1022   BILITOT 0.4 05/10/2012 1440    ADDITIONAL INFORMATION: 6. PROGNOSTIC INDICATORS - ACIS Results: IMMUNOHISTOCHEMICAL AND MORPHOMETRIC ANALYSIS BY THE AUTOMATED CELLULAR IMAGING SYSTEM (ACIS) Estrogen Receptor: 0%, NEGATIVE Progesterone Receptor: 0%, NEGATIVE Proliferation Marker Ki67: 92% COMMENT: The negative hormone receptor study(ies) in this case have an internal positive control. REFERENCE RANGE ESTROGEN RECEPTOR NEGATIVE <1% POSITIVE =>1% PROGESTERONE RECEPTOR NEGATIVE <1% POSITIVE =>1% All controls stained appropriately Pecola Leisure MD Pathologist, Electronic Signature ( Signed  05/21/2012) 6. CHROMOGENIC IN-SITU HYBRIDIZATION Results: HER-2/NEU BY CISH - NO AMPLIFICATION OF HER-2 DETECTED. 1 of 6 Duplicate copy FINAL for Tye, [name] [name]) ADDITIONAL INFORMATION:(continued) RESULT RATIO OF HER2: CEP 17 SIGNALS 1.19 AVERAGE HER2 COPY NUMBER PER CELL 1.85 REFERENCE RANGE NEGATIVE HER2/Chr17 Ratio <2.0 and Average HER2 copy number <4.0 EQUIVOCAL HER2/Chr17 Ratio <2.0  and Average HER2 copy number 4.0 and <6.0 POSITIVE HER2/Chr17 Ratio >=2.0 and/or Average HER2 copy number >=6.0 Pecola Leisure MD Pathologist, Electronic Signature ( Signed 05/20/2012) FINAL DIAGNOSIS Diagnosis 1. Lymph node, sentinel, biopsy, Left axillary - THERE IS NO EVIDENCE OF CARCINOMA IN 1 OF 1 LYMPH NODE (0/1). 2. Lymph node, sentinel, biopsy, Left axillary - THERE IS NO EVIDENCE OF CARCINOMA IN 1 OF 1 LYMPH NODE (0/1). 3. Lymph node, sentinel, biopsy, Left axillary - THERE IS NO EVIDENCE OF CARCINOMA IN 1 OF 1 LYMPH NODE (0/1). 4. Lymph node, sentinel, biopsy, Left axillary - THERE IS NO EVIDENCE OF CARCINOMA IN 1 OF 1 LYMPH NODE (0/1). 5. Lymph node, sentinel, biopsy, Left axillary - THERE IS NO EVIDENCE OF CARCINOMA IN 1 OF 1 LYMPH NODE (0/1). 6. Breast, simple mastectomy, Right - INVASIVE DUCTAL CARCINOMA, GRADE III/III, SPANNING 1.2 CM. - DUCTAL CARCINOMA IN SITU, HIGH GRADE. - LYMPHOVASCULAR INVASION IS IDENTIFIED. - THERE IS NO EVIDENCE OF CARCINOMA IN 1 OF 1 LYMPH NODE (0/1). - SEE ONCOLOGY TABLE BELOW. 7. Breast, simple mastectomy, Left - INVASIVE DUCTAL CARCINOMA, AT LEAST THREE FOCI, GRADE II/III, SPANNING 1.4 CM, 1.2 CM, AND 0.1 CM. - DUCTAL CARCINOMA IN SITU, INTERMEDIATE GRADE. - LYMPHOVASCULAR INVASION IS IDENTIFIED. - DUCTAL CARCINOMA IN SITU IS FOCALLY 0.2 CM TO THE ANTERIOR/INFERIOR SOFT TISSUE RESECTION MARGIN. - LOBULAR NEOPLASIA (LOBULAR CARCINOMA IN SITU). - THERE IS NO EVIDENCE OF CARCINOMA IN 2 OF 2 LYMPH NODES (0/2). - SEE ONCOLOGY TABLE  BELOW. 8. Lymph node, biopsy, Left axillary - THERE IS NO EVIDENCE OF CARCINOMA IN 1 OF 1 LYMPH NODE (0/1). Microscopic Comment 6. BREAST, INVASIVE TUMOR, WITH LYMPH NODE SAMPLING Specimen, including laterality: Right breast Procedure: Simple mastectomy Grade: 3 2 of 6 Duplicate copy FINAL for [name] [name], [name] [name] ([id number]) Microscopic Comment(continued) Tubule formation: 3 Nuclear pleomorphism: 3 Mitotic:2 Tumor size (gross measurement): 1.2 cm Margins: Invasive, distance to closest margin: 1.3 cm to the deep margin (gross measurement) In-situ, distance to closest margin: 1.3 cm to the deep margin (gross measurement) Lymphovascular invasion: Present Ductal carcinoma in situ: Present Grade: High grade Extensive intraductal component: Not identified Lobular neoplasia: Not identified in specimen #6 Tumor focality: Unifocal Treatment effect: N/A Extent of tumor: Confined to breast parenchyma Lymph nodes: # examined: 1 Lymph nodes with metastasis: 0 Breast prognostic profile: Will be performed on the current case and the results reported separately. TNM: pT1c, pN0 7. BREAST, INVASIVE TUMOR, WITH LYMPH NODE SAMPLING Specimen, including laterality: Left breast Procedure: Simple mastectomy Grade: 2 Tubule formation: 3 Nuclear pleomorphism: 2 Mitotic:2 Tumor size (gross measurements) 1.4 cm, 1.2 cm, and 0.1 cm Margins: Invasive, distance to closest margin: 0.7 cm to the anterior/inferior soft tissue resection margin In-situ, distance to closest margin: Focally 0.2 cm to the anterior/inferior soft tissue resection margin (glass slide measurement) Lymphovascular invasion: Present Ductal carcinoma in situ: Present Grade: Intermediate grade Extensive intraductal component: Yes Lobular neoplasia: Yes (lobular carcinoma in situ) Tumor focality: Multiple foci Treatment effect: N/A Extent of tumor: Confined to breast parenchyma Lymph nodes: # examined: 8 Lymph nodes with  metastasis: 0 Breast prognostic profile: [phone] Estrogen receptor: 100% strong staining intensity Progesterone receptor: 11% strong staining intensity Her 2 neu: Amplification was detected. The ratio was 6.50. 3 of 6 Duplicate copy FINAL for [name] [name], [name] [name] ([id number]) Microscopic Comment(continued) Ki-67: 80% TNM: mpT1c, pN0 Comments: In addition to the two grossly identified nodules, there is invasive carcinoma present in random tissue submitted from the inferior lateral quadrant. The three  foci of carcinoma are morphologically similar.   RADIOGRAPHIC STUDIES:  Nm Pet Image Initial (pi) Skull Base To Thigh  05/12/2012  *RADIOLOGY REPORT*  Clinical Data: Initial treatment strategy for breast cancer.   NUCLEAR MEDICINE PET SKULL BASE TO THIGH  Fasting Blood Glucose:  143  Technique:  20 mCi F-18 FDG was injected intravenously. CT data was obtained and used for attenuation correction and anatomic localization only.  (This was not acquired as a diagnostic CT examination.) Additional exam technical data entered on technologist worksheet.  Comparison:  none  Findings:  Neck: No hypermetabolic lymph nodes in the neck.  Chest:  In the inferior medial aspect of the right breast there is a 10 mm nodule (image 99) with moderate  metabolic activity ( SUV max = 2.9).  No hypermetabolic mediastinal or hilar lymph nodes.  There is hypermetabolic activity in the thoracic inlet which is felt to be vascular in nature.  There is activity adjacent to the thoracic aorta which is also felt to be vascular may relate to the hemiazygos vein.  Abdomen/Pelvis:  No abnormal hypermetabolic activity within the liver, pancreas, adrenal glands, or spleen.  No hypermetabolic lymph nodes in the abdomen or pelvis.  Skeleton:  No focal hypermetabolic activity to suggest skeletal metastasis.  IMPRESSION:  1.  No evidence of distant breast cancer metastasis. 2.  Hypermetabolic nodule within the inferior medial right  breast   Original Report Authenticated By: Genevive Bi, M.D.    Nm Sentinel Node Inj-no Rpt (breast)  05/14/2012  CLINICAL DATA: cancer left breast   Sulfur colloid was injected intradermally by the nuclear medicine  technologist for breast cancer sentinel node localization.     Dg Chest Port 1 View  05/14/2012  *RADIOLOGY REPORT*  Clinical Data: Status post Port-A-Cath placement  PORTABLE CHEST - 1 VIEW  Comparison: None.  Findings: The cardiac shadow is within normal limits.  Bilateral tissue expanders as well as surgical drains are identified.  A right chest wall port is seen. The catheter tip is at the cavoatrial junction.  No pneumothorax is noted.  The lungs are clear bilaterally.  IMPRESSION: No evidence of pneumothorax following port placement.  Bilateral tissue expanders.   Original Report Authenticated By: Alcide Clever, M.D.    Dg Fluoro Guide Cv Line-no Report  05/14/2012  CLINICAL DATA: LEFT BREAST CANCER/PORT-A-CATH   FLOURO GUIDE CV LINE  Fluoroscopy was utilized by the requesting physician.  No radiographic  interpretation.      ASSESSMENT: 62 year old female with  #1 bilateral breast cancers on the right side she has triple-negative disease stage I left side stage I ER positive PR positive HER-2/neu positive disease. Patient is status post bilateral mastectomies. Of note on the right side the lymph node removed was not a sentinel lymph node. It is unclear what to do with this side in terms of whether or not to do further sentinel lymph node. Clinically patient seems to be doing well.  #2 we discussed her pathology in detail. We discussed chemotherapy to be given adjuvantly. We discussed Adriamycin Cytoxan given every 2 weeks for a total of 4 cycles followed by Taxol and Herceptin given weekly x12 weeks followed by Herceptin every 3 weeks to finish out a total of one year of therapy. We also discussed role of adjuvant hormonal therapy since the left side is ER positive.  #3  patient is now here (5/16) to begin cycle 1 day 1 of Adriamycin and Cytoxan to be given every 2 weeks for  a total of 4 cycles. Patient is now status post 4 cycles of Adriamycin Cytoxan given from 07/02/2012 through 08/12/2012. Overall she tolerated the treatment well.  #4 patient will begin Taxol and Herceptin every week for a total of 12 weeks beginning 08/27/2012. Patient understands risks benefits and side effects.   PLAN:  #1 we will proceed with cycle 1 of Taxol and Herceptin. We discussed the risks and benefits of treatment.  #2 I have recommended he proceed with super B complex to help prevent tingling associated with Taxol.  #3 I will also be in touch with Dr. Garnette Scheuermann regarding cardiac followup every 3 months. We will be doing echocardiograms every 3 months since Herceptin can cause cardiac issues.  #4 I will see you back in one week's time for cycle #2 of Taxol and Herceptin.  All questions were answered. The patient knows to call the clinic with any problems, questions or concerns. We can certainly see the patient much sooner if necessary.  I spent 25 minutes counseling the patient face to face. The total time spent in the appointment was 30 minutes.    Drue Second, MD Medical/Oncology [facility] [phone] (beeper) [phone] (Office)  08/27/2012, 9:25 AM

## 2012-08-30 ENCOUNTER — Telehealth: Payer: Self-pay | Admitting: *Deleted

## 2012-08-30 NOTE — Telephone Encounter (Signed)
Message copied by Augusto Garbe on Mon Aug 30, 2012  2:57 PM ------      Message from: Jolaine Artist      Created: Fri Aug 27, 2012  1:12 PM      Regarding: chemo follow-up call       1st Taxol/Herceptin  Dr. Welton Flakes [phone] ------

## 2012-08-30 NOTE — Telephone Encounter (Signed)
[name] [name] is doing fine.  Reports feeling more nausea with this regimen than with the A/C.  More indigestion and vomiting.  "Worse in the mornings."  Has taken compazine and lorazepam.  Vomited yesterday and Saturday.  Able to eat soups and drink water.  Reports loose stools but no more than three stools per day.  Asked that she call if four or more stools per day.  Denies questions at this time.

## 2012-09-03 ENCOUNTER — Other Ambulatory Visit (HOSPITAL_BASED_OUTPATIENT_CLINIC_OR_DEPARTMENT_OTHER): Payer: BC Managed Care – PPO | Admitting: Lab

## 2012-09-03 ENCOUNTER — Ambulatory Visit (HOSPITAL_BASED_OUTPATIENT_CLINIC_OR_DEPARTMENT_OTHER): Payer: BC Managed Care – PPO

## 2012-09-03 ENCOUNTER — Ambulatory Visit (HOSPITAL_BASED_OUTPATIENT_CLINIC_OR_DEPARTMENT_OTHER): Payer: BC Managed Care – PPO | Admitting: Oncology

## 2012-09-03 VITALS — BP 135/78 | HR 87 | Temp 98.5°F | Resp 20 | Ht 64.0 in | Wt 185.1 lb

## 2012-09-03 DIAGNOSIS — C50911 Malignant neoplasm of unspecified site of right female breast: Secondary | ICD-10-CM

## 2012-09-03 DIAGNOSIS — C50912 Malignant neoplasm of unspecified site of left female breast: Secondary | ICD-10-CM

## 2012-09-03 DIAGNOSIS — C50119 Malignant neoplasm of central portion of unspecified female breast: Secondary | ICD-10-CM

## 2012-09-03 DIAGNOSIS — Z5112 Encounter for antineoplastic immunotherapy: Secondary | ICD-10-CM

## 2012-09-03 DIAGNOSIS — R5383 Other fatigue: Secondary | ICD-10-CM

## 2012-09-03 DIAGNOSIS — R5381 Other malaise: Secondary | ICD-10-CM

## 2012-09-03 DIAGNOSIS — C50919 Malignant neoplasm of unspecified site of unspecified female breast: Secondary | ICD-10-CM

## 2012-09-03 DIAGNOSIS — Z5111 Encounter for antineoplastic chemotherapy: Secondary | ICD-10-CM

## 2012-09-03 DIAGNOSIS — G5790 Unspecified mononeuropathy of unspecified lower limb: Secondary | ICD-10-CM

## 2012-09-03 LAB — COMPREHENSIVE METABOLIC PANEL (CC13)
ALT: 28 U/L (ref 0–55)
AST: 21 U/L (ref 5–34)
Albumin: 3.4 g/dL — ABNORMAL LOW (ref 3.5–5.0)
Alkaline Phosphatase: 93 U/L (ref 40–150)
BUN: 8.8 mg/dL (ref 7.0–26.0)
CO2: 27 mEq/L (ref 22–29)
Calcium: 9.4 mg/dL (ref 8.4–10.4)
Chloride: 106 mEq/L (ref 98–109)
Creatinine: 0.7 mg/dL (ref 0.6–1.1)
Glucose: 123 mg/dl (ref 70–140)
Potassium: 3.4 mEq/L — ABNORMAL LOW (ref 3.5–5.1)
Sodium: 142 mEq/L (ref 136–145)
Total Bilirubin: 0.24 mg/dL (ref 0.20–1.20)
Total Protein: 6.3 g/dL — ABNORMAL LOW (ref 6.4–8.3)

## 2012-09-03 LAB — CBC WITH DIFFERENTIAL/PLATELET
BASO%: 0.4 % (ref 0.0–2.0)
Basophils Absolute: 0 10*3/uL (ref 0.0–0.1)
EOS%: 0.3 % (ref 0.0–7.0)
Eosinophils Absolute: 0 10*3/uL (ref 0.0–0.5)
HCT: 33.8 % — ABNORMAL LOW (ref 34.8–46.6)
HGB: 11.3 g/dL — ABNORMAL LOW (ref 11.6–15.9)
LYMPH%: 14 % (ref 14.0–49.7)
MCH: 29.4 pg (ref 25.1–34.0)
MCHC: 33.4 g/dL (ref 31.5–36.0)
MCV: 88 fL (ref 79.5–101.0)
MONO#: 0.6 10*3/uL (ref 0.1–0.9)
MONO%: 8.4 % (ref 0.0–14.0)
NEUT#: 5.7 10*3/uL (ref 1.5–6.5)
NEUT%: 76.9 % — ABNORMAL HIGH (ref 38.4–76.8)
Platelets: 337 10*3/uL (ref 145–400)
RBC: 3.84 10*6/uL (ref 3.70–5.45)
RDW: 16.5 % — ABNORMAL HIGH (ref 11.2–14.5)
WBC: 7.4 10*3/uL (ref 3.9–10.3)
lymph#: 1 10*3/uL (ref 0.9–3.3)
nRBC: 0 % (ref 0–0)

## 2012-09-03 MED ORDER — ACETAMINOPHEN 325 MG PO TABS
650.0000 mg | ORAL_TABLET | Freq: Once | ORAL | Status: AC
  Administered 2012-09-03: 650 mg via ORAL

## 2012-09-03 MED ORDER — HEPARIN SOD (PORK) LOCK FLUSH 100 UNIT/ML IV SOLN
500.0000 [IU] | Freq: Once | INTRAVENOUS | Status: AC | PRN
  Administered 2012-09-03: 500 [IU]
  Filled 2012-09-03: qty 5

## 2012-09-03 MED ORDER — SODIUM CHLORIDE 0.9 % IJ SOLN
10.0000 mL | INTRAMUSCULAR | Status: DC | PRN
  Administered 2012-09-03: 10 mL
  Filled 2012-09-03: qty 10

## 2012-09-03 MED ORDER — ONDANSETRON 8 MG/50ML IVPB (CHCC)
8.0000 mg | Freq: Once | INTRAVENOUS | Status: AC
  Administered 2012-09-03: 8 mg via INTRAVENOUS

## 2012-09-03 MED ORDER — SODIUM CHLORIDE 0.9 % IV SOLN
Freq: Once | INTRAVENOUS | Status: AC
  Administered 2012-09-03: 13:00:00 via INTRAVENOUS

## 2012-09-03 MED ORDER — DEXAMETHASONE SODIUM PHOSPHATE 10 MG/ML IJ SOLN
10.0000 mg | Freq: Once | INTRAMUSCULAR | Status: AC
  Administered 2012-09-03: 10 mg via INTRAVENOUS

## 2012-09-03 MED ORDER — DIPHENHYDRAMINE HCL 25 MG PO CAPS
50.0000 mg | ORAL_CAPSULE | Freq: Once | ORAL | Status: AC
  Administered 2012-09-03: 50 mg via ORAL

## 2012-09-03 MED ORDER — PACLITAXEL PROTEIN-BOUND CHEMO INJECTION 100 MG
100.0000 mg/m2 | Freq: Once | INTRAVENOUS | Status: AC
  Administered 2012-09-03: 200 mg via INTRAVENOUS
  Filled 2012-09-03: qty 40

## 2012-09-03 MED ORDER — SODIUM CHLORIDE 0.9 % IV SOLN
2.0000 mg/kg | Freq: Once | INTRAVENOUS | Status: AC
  Administered 2012-09-03: 168 mg via INTRAVENOUS
  Filled 2012-09-03: qty 8

## 2012-09-03 NOTE — Patient Instructions (Addendum)
Trastuzumab injection for infusion What is this medicine? TRASTUZUMAB (tras TOO zoo mab) is a monoclonal antibody. It targets a protein called HER2. This protein is found in some stomach and breast cancers. This medicine can stop cancer cell growth. This medicine may be used with other cancer treatments. This medicine may be used for other purposes; ask your health care provider or pharmacist if you have questions. What should I tell my health care provider before I take this medicine? They need to know if you have any of these conditions: -heart disease -heart failure -infection (especially a virus infection such as chickenpox, cold sores, or herpes) -lung or breathing disease, like asthma -recent or ongoing radiation therapy -an unusual or allergic reaction to trastuzumab, benzyl alcohol, or other medications, foods, dyes, or preservatives -pregnant or trying to get pregnant -breast-feeding How should I use this medicine? This drug is given as an infusion into a vein. It is administered in a hospital or clinic by a specially trained health care professional. Talk to your pediatrician regarding the use of this medicine in children. This medicine is not approved for use in children. Overdosage: If you think you have taken too much of this medicine contact a poison control center or emergency room at once. NOTE: This medicine is only for you. Do not share this medicine with others. What if I miss a dose? It is important not to miss a dose. Call your doctor or health care professional if you are unable to keep an appointment. What may interact with this medicine? -cyclophosphamide -doxorubicin -warfarin This list may not describe all possible interactions. Give your health care provider a list of all the medicines, herbs, non-prescription drugs, or dietary supplements you use. Also tell them if you smoke, drink alcohol, or use illegal drugs. Some items may interact with your medicine. What  should I watch for while using this medicine? Visit your doctor for checks on your progress. Report any side effects. Continue your course of treatment even though you feel ill unless your doctor tells you to stop. Call your doctor or health care professional for advice if you get a fever, chills or sore throat, or other symptoms of a cold or flu. Do not treat yourself. Try to avoid being around people who are sick. You may experience fever, chills and shaking during your first infusion. These effects are usually mild and can be treated with other medicines. Report any side effects during the infusion to your health care professional. Fever and chills usually do not happen with later infusions. What side effects may I notice from receiving this medicine? Side effects that you should report to your doctor or other health care professional as soon as possible: -breathing difficulties -chest pain or palpitations -cough -dizziness or fainting -fever or chills, sore throat -skin rash, itching or hives -swelling of the legs or ankles -unusually weak or tired Side effects that usually do not require medical attention (report to your doctor or other health care professional if they continue or are bothersome): -loss of appetite -headache -muscle aches -nausea This list may not describe all possible side effects. Call your doctor for medical advice about side effects. You may report side effects to FDA at 1-800-FDA-1088. Where should I keep my medicine? This drug is given in a hospital or clinic and will not be stored at home. NOTE: This sheet is a summary. It may not cover all possible information. If you have questions about this medicine, talk to your doctor, pharmacist,   or health care provider.  2013, [employer]. (12/08/2008 1:43:15 PM) Nanoparticle Albumin-Bound Paclitaxel injection What is this medicine? NANOPARTICLE ALBUMIN-BOUND PACLITAXEL (Na no PAHR ti kuhl al BYOO muhn-bound  PAK li TAX el) is a chemotherapy drug. It targets fast dividing cells, like cancer cells, and causes these cells to die. This medicine is used to treat advanced breast cancer and advanced lung cancer. This medicine may be used for other purposes; ask your health care provider or pharmacist if you have questions. What should I tell my health care provider before I take this medicine? They need to know if you have any of these conditions: -kidney disease -liver disease -low blood counts, like low platelets, red blood cells, or white blood cells -recent or ongoing radiation therapy -an unusual or allergic reaction to paclitaxel, albumin, other chemotherapy, other medicines, foods, dyes, or preservatives -pregnant or trying to get pregnant -breast-feeding How should I use this medicine? This drug is given as an infusion into a vein. It is administered in a hospital or clinic by a specially trained health care professional. Talk to your pediatrician regarding the use of this medicine in children. Special care may be needed. Overdosage: If you think you have taken too much of this medicine contact a poison control center or emergency room at once. NOTE: This medicine is only for you. Do not share this medicine with others. What if I miss a dose? It is important not to miss your dose. Call your doctor or health care professional if you are unable to keep an appointment. What may interact with this medicine? This medicine may also interact with the following medications: -cyclosporine -dexamethasone -diazepam -ketoconazole -medicines to increase blood counts like filgrastim, pegfilgrastim, sargramostim -other chemotherapy drugs like cisplatin, doxorubicin, epirubicin, etoposide, teniposide, vincristine -quinidine -testosterone -vaccines -verapamil Talk to your doctor or health care professional before taking any of these  medicines: -acetaminophen -aspirin -ibuprofen -ketoprofen -naproxen This list may not describe all possible interactions. Give your health care provider a list of all the medicines, herbs, non-prescription drugs, or dietary supplements you use. Also tell them if you smoke, drink alcohol, or use illegal drugs. Some items may interact with your medicine. What should I watch for while using this medicine? Your condition will be monitored carefully while you are receiving this medicine. You will need important blood work done while you are taking this medicine. This drug may make you feel generally unwell. This is not uncommon, as chemotherapy can affect healthy cells as well as cancer cells. Report any side effects. Continue your course of treatment even though you feel ill unless your doctor tells you to stop. In some cases, you may be given additional medicines to help with side effects. Follow all directions for their use. Call your doctor or health care professional for advice if you get a fever, chills or sore throat, or other symptoms of a cold or flu. Do not treat yourself. This drug decreases your body's ability to fight infections. Try to avoid being around people who are sick. This medicine may increase your risk to bruise or bleed. Call your doctor or health care professional if you notice any unusual bleeding. Be careful brushing and flossing your teeth or using a toothpick because you may get an infection or bleed more easily. If you have any dental work done, tell your dentist you are receiving this medicine. Avoid taking products that contain aspirin, acetaminophen, ibuprofen, naproxen, or ketoprofen unless instructed by your doctor. These medicines may hide  a fever. Do not become pregnant while taking this medicine. Women should inform their doctor if they wish to become pregnant or think they might be pregnant. There is a potential for serious side effects to an unborn child. Talk to  your health care professional or pharmacist for more information. Do not breast-feed an infant while taking this medicine. Men are advised not to father a child while receiving this medicine. What side effects may I notice from receiving this medicine? Side effects that you should report to your doctor or health care professional as soon as possible: -allergic reactions like skin rash, itching or hives, swelling of the face, lips, or tongue -low blood counts - This drug may decrease the number of white blood cells, red blood cells and platelets. You may be at increased risk for infections and bleeding. -signs of infection - fever or chills, cough, sore throat, pain or difficulty passing urine -signs of decreased platelets or bleeding - bruising, pinpoint red spots on the skin, black, tarry stools, nosebleeds -signs of decreased red blood cells - unusually weak or tired, fainting spells, lightheadedness -breathing problems -changes in vision -chest pain -high or low blood pressure -mouth sores -nausea and vomiting -pain, swelling, redness or irritation at the injection site -pain, tingling, numbness in the hands or feet -slow or irregular heartbeat -swelling of the ankle, feet, hands Side effects that usually do not require medical attention (report to your doctor or health care professional if they continue or are bothersome): -aches, pains -changes in the color of fingernails -diarrhea -hair loss -loss of appetite This list may not describe all possible side effects. Call your doctor for medical advice about side effects. You may report side effects to FDA at 1-800-FDA-1088. Where should I keep my medicine? This drug is given in a hospital or clinic and will not be stored at home. NOTE: This sheet is a summary. It may not cover all possible information. If you have questions about this medicine, talk to your doctor, pharmacist, or health care provider.  2013, [employer].  (12/09/2010 4:13:49 PM)

## 2012-09-06 NOTE — Progress Notes (Signed)
OFFICE PROGRESS NOTE  CC  [name] Aliment, MD [address] [place] [address] Dr. 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 Patient is status post bilateral mastectomy with immediate reconstruction  STAGE:  Right breast (mastectomy)  T1cN0 1.2 cm ER-/PR-/her-/ki-67 92% 0/1 nodes positive  Left breast (mastectomy with snl) T1cN0 1.4 cm, 1.2 cm, 0.1c ER 100%, PR 11%, her2neu positive 6.50, Ki-67 80%   PRIOR THERAPY:  #1screening mammogram performed that showed 2 areas of concern on the left side.calcifications were noted in the left. In the right breast a possible mass warranted further evaluation.   #2 On 03/29/2012 patient underwent a bilateral diagnostic mammogram and right breast ultrasound. A spot magnification images demonstrate suspicious group of pleomorphic microcalcifications over the outer lower left breast with additional suspicious group of pleomorphic microcalcifications over the outer midportion of the left periareolar region. Spot compression images of the right breast demonstrate persistence of a 1 cm density at the edge of the film in the deep third of the right inner breast.   #3Ultrasound performed showed no focal abnormality over the entire in her right breast to correspond to the mammographic density. Patient was recommended stereotactic core needle biopsy of the 2 groups of suspicious left breast microcalcifications. Because patient and her husband wanted bilateral mastectomies MRI was not performed.on 04/15/2012 patient had needle core biopsy performed of the 2 areas in the left breast. The left needle core biopsy in the lower breast revealed ductal carcinoma in situ with associated comedo necrosis and calcifications with the in situ carcinoma. It was ER +100% PR +12%. 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 Tumor was ER +100% PR +11% proliferation marker Ki-67 elevated at 80% HER-2/neu showed amplification with a ratio of 6.50.   #4 patient is now status post bilateral mastectomies performed on 05/14/2012 with immediate reconstruction. The final pathology revealed bilateral breast cancers. In the right breast tissue is noted to have a 1.2 cm invasive ductal carcinoma high-grade ER negative PR negative HER-2/neu negative with Ki-67 92% one node was negative for metastatic disease but this was not the sentinel lymph node. On the left side the patient had multifocal disease measuring 1.4 cm, 1.2 cm, 0.1 cm. Tumor was ER +100% PR +11% HER-2/neu positive with a ratio 6.50. Ki-67 was 80% 8 sentinel nodes were negative for metastatic disease.  #5 patient has had immediate reconstruction with expanders bilaterally.  #6s/p adjuvant chemotherapy initially consisting of Adriamycin Cytoxan every 2 weeks for a total of 4 cycles. Given 07/02/12 - 08/12/12  #7This will then be followed by Taxol and Herceptin to be given weekly for 12 weeks time.beginning 08/27/12. This was discontinued due to neuropathy.  #8 Abraxane and Herceptin days 1, day 8, day 15 on a 28 day cycle. Beginning 09/03/2012   CURRENT THERAPY: Abraxane and Herceptin days 1,   INTERVAL HISTORY: [name] [name] 62 y.o. female returns for followup visit. overall patient is doing well. She does complain of peripheral paresthesias with pins and needles and difficulty in ambulation more so in her feet. She also is weak tired and fatigued. She has no nausea or vomiting no fevers chills or night sweats. She has been eating well. No headaches no diarrhea or constipation no changes in her bladder habits. Remainder  of the 10 point review of systems is negative.  MEDICAL HISTORY: Past Medical History  Diagnosis Date  . Heart murmur   . Diabetes mellitus  without complication   . Osteopenia   . Cancer     breast  . Bronchitis   . Pneumonia   . Hiatal hernia   . Sinus problem   . Diabetes mellitus   . Breast cancer 04/15/12    left-biopsy  . Breast cancer, right breast 05/14/12    right mastectomy    ALLERGIES:  is allergic to darvon and penicillins.  MEDICATIONS:  Current Outpatient Prescriptions  Medication Sig Dispense Refill  . Ascorbic Acid (VITAMIN C) 500 MG tablet Take 500 mg by mouth daily.        . Biotin 10 MG TABS Take by mouth. 4000 mcg daily      . calcitonin, salmon, (MIACALCIN/FORTICAL) 200 UNIT/ACT nasal spray Place 1 spray into the nose daily.      . Calcium Citrate (CITRACAL PO) Take by mouth.      . Cholecalciferol (VITAMIN D PO) Take 1 tablet by mouth daily.      . cyclobenzaprine (FLEXERIL) 10 MG tablet Take 10 mg by mouth 3 (three) times daily as needed for muscle spasms.      Marland Kitchen docusate sodium 100 MG CAPS Take 100 mg by mouth 2 (two) times daily.  30 capsule  1  . KRILL OIL PO Take by mouth.      . lidocaine-prilocaine (EMLA) cream Apply topically as needed.  30 [name]  1  . Multiple Vitamin (MULTIVITAMIN PO) Take 1 tablet by mouth daily.       . pantoprazole (PROTONIX) 40 MG tablet Take 40 mg by mouth daily.        . Probiotic Product (PROBIOTIC DAILY PO) Take by mouth daily.      . rosuvastatin (CRESTOR) 5 MG tablet Take 5 mg by mouth every Monday, Wednesday, and Friday.      . sitaGLIPtin (JANUVIA) 100 MG tablet Take 100 mg by mouth daily.      Marland Kitchen UNABLE TO FIND Apply 1 Device topically once. Cranial prosthesis r/t chemotherapy treatment  1 Device  0  . ciprofloxacin (CIPRO) 500 MG tablet Take 1 tablet (500 mg total) by mouth 2 (two) times daily.  14 tablet  6   No current facility-administered medications for this visit.    SURGICAL HISTORY:  Past Surgical History  Procedure Laterality Date  . Cesarean section  1981, 1983  . Tummy tuck  1999  . Tonsillectomy    . Foot surgery    . Gastric bypass    .  Wound debridement  01/14/2011    Procedure: DEBRIDEMENT ABDOMINAL WOUND;  Surgeon: Ernestene Mention, MD;  Location: [facility];  Service: General;  Laterality: N/A;  wound debridement and debridement on the abdomen  . Cosmetic surgery    . Total mastectomy Bilateral 05/14/2012    Procedure: TOTAL MASTECTOMY;  Surgeon: Ernestene Mention, MD;  Location: [facility] OR;  Service: General;  Laterality: Bilateral;  . Axillary sentinel node biopsy Left 05/14/2012    Procedure: AXILLARY SENTINEL NODE BIOPSY;  Surgeon: Ernestene Mention, MD;  Location: [facility] OR;  Service: General;  Laterality: Left;  . Portacath placement N/A 05/14/2012    Procedure: INSERTION PORT-A-CATH;  Surgeon: Ernestene Mention, MD;  Location: [facility] OR;  Service: General;  Laterality: N/A;  . Breast reconstruction with placement of tissue expander and flex hd (acellular hydrated  dermis) Bilateral 05/14/2012    Procedure: BREAST RECONSTRUCTION WITH PLACEMENT OF TISSUE EXPANDER AND FLEX HD (ACELLULAR HYDRATED DERMIS);  Surgeon: Etter Sjogren, MD;  Location: [facility] OR;  Service: Plastics;  Laterality: Bilateral;  . Bunionectomy      REVIEW OF SYSTEMS:  Pertinent items are noted in HPI.   HEALTH MAINTENANCE:   PHYSICAL EXAMINATION: Blood pressure 135/78, pulse 87, temperature 98.5 F (36.9 C), temperature source Oral, resp. rate 20, height 5\' 4"  (1.626 m), weight 185 lb 1.6 oz (83.961 kg). Body mass index is 31.76 kg/(m^2). ECOG PERFORMANCE STATUS: 0 - Asymptomatic Well-developed well-nourished very pleasant female in no acute distress HEENT exam EOMI PERRLA sclerae anicteric no conjunctival pallor oral mucosa is moist neck is supple lungs are clear cardiovascular regular rate rhythm abdomen soft nontender no HSM extremities no edema neuro is nonfocal Bilateral expanders in place mild tenderness     LABORATORY DATA: Lab Results  Component Value Date   WBC 7.4 09/03/2012   HGB 11.3* 09/03/2012   HCT 33.8* 09/03/2012   MCV 88.0 09/03/2012    PLT 337 09/03/2012      Chemistry      Component Value Date/Time   NA 142 09/03/2012 1023   NA 139 05/17/2012 0530   K 3.4* 09/03/2012 1023   K 3.7 05/17/2012 0530   CL 104 07/30/2012 0955   CL 100 05/17/2012 0530   CO2 27 09/03/2012 1023   CO2 33* 05/17/2012 0530   BUN 8.8 09/03/2012 1023   BUN 4* 05/17/2012 0530   CREATININE 0.7 09/03/2012 1023   CREATININE 0.56 05/17/2012 0530      Component Value Date/Time   CALCIUM 9.4 09/03/2012 1023   CALCIUM 8.7 05/17/2012 0530   ALKPHOS 93 09/03/2012 1023   ALKPHOS 103 05/10/2012 1440   AST 21 09/03/2012 1023   AST 25 05/10/2012 1440   ALT 28 09/03/2012 1023   ALT 17 05/10/2012 1440   BILITOT 0.24 09/03/2012 1023   BILITOT 0.4 05/10/2012 1440    ADDITIONAL INFORMATION: 6. PROGNOSTIC INDICATORS - ACIS Results: IMMUNOHISTOCHEMICAL AND MORPHOMETRIC ANALYSIS BY THE AUTOMATED CELLULAR IMAGING SYSTEM (ACIS) Estrogen Receptor: 0%, NEGATIVE Progesterone Receptor: 0%, NEGATIVE Proliferation Marker Ki67: 92% COMMENT: The negative hormone receptor study(ies) in this case have an internal positive control. REFERENCE RANGE ESTROGEN RECEPTOR NEGATIVE <1% POSITIVE =>1% PROGESTERONE RECEPTOR NEGATIVE <1% POSITIVE =>1% All controls stained appropriately Pecola Leisure MD Pathologist, Electronic Signature ( Signed 05/21/2012) 6. CHROMOGENIC IN-SITU HYBRIDIZATION Results: HER-2/NEU BY CISH - NO AMPLIFICATION OF HER-2 DETECTED. 1 of 6 Duplicate copy FINAL for [place], [name] [name] [phone]) ADDITIONAL INFORMATION:(continued) RESULT RATIO OF HER2: CEP 17 SIGNALS 1.19 AVERAGE HER2 COPY NUMBER PER CELL 1.85 REFERENCE RANGE NEGATIVE HER2/Chr17 Ratio <2.0 and Average HER2 copy number <4.0 EQUIVOCAL HER2/Chr17 Ratio <2.0 and Average HER2 copy number 4.0 and <6.0 POSITIVE HER2/Chr17 Ratio >=2.0 and/or Average HER2 copy number >=6.0 Pecola Leisure MD Pathologist, Electronic Signature ( Signed 05/20/2012) FINAL DIAGNOSIS Diagnosis 1. Lymph node, sentinel,  biopsy, Left axillary - THERE IS NO EVIDENCE OF CARCINOMA IN 1 OF 1 LYMPH NODE (0/1). 2. Lymph node, sentinel, biopsy, Left axillary - THERE IS NO EVIDENCE OF CARCINOMA IN 1 OF 1 LYMPH NODE (0/1). 3. Lymph node, sentinel, biopsy, Left axillary - THERE IS NO EVIDENCE OF CARCINOMA IN 1 OF 1 LYMPH NODE (0/1). 4. Lymph node, sentinel, biopsy, Left axillary - THERE IS NO EVIDENCE OF CARCINOMA IN 1 OF 1 LYMPH NODE (0/1). 5. Lymph node, sentinel, biopsy, Left axillary - THERE IS  NO EVIDENCE OF CARCINOMA IN 1 OF 1 LYMPH NODE (0/1). 6. Breast, simple mastectomy, Right - INVASIVE DUCTAL CARCINOMA, GRADE III/III, SPANNING 1.2 CM. - DUCTAL CARCINOMA IN SITU, HIGH GRADE. - LYMPHOVASCULAR INVASION IS IDENTIFIED. - THERE IS NO EVIDENCE OF CARCINOMA IN 1 OF 1 LYMPH NODE (0/1). - SEE ONCOLOGY TABLE BELOW. 7. Breast, simple mastectomy, Left - INVASIVE DUCTAL CARCINOMA, AT LEAST THREE FOCI, GRADE II/III, SPANNING 1.4 CM, 1.2 CM, AND 0.1 CM. - DUCTAL CARCINOMA IN SITU, INTERMEDIATE GRADE. - LYMPHOVASCULAR INVASION IS IDENTIFIED. - DUCTAL CARCINOMA IN SITU IS FOCALLY 0.2 CM TO THE ANTERIOR/INFERIOR SOFT TISSUE RESECTION MARGIN. - LOBULAR NEOPLASIA (LOBULAR CARCINOMA IN SITU). - THERE IS NO EVIDENCE OF CARCINOMA IN 2 OF 2 LYMPH NODES (0/2). - SEE ONCOLOGY TABLE BELOW. 8. Lymph node, biopsy, Left axillary - THERE IS NO EVIDENCE OF CARCINOMA IN 1 OF 1 LYMPH NODE (0/1). Microscopic Comment 6. BREAST, INVASIVE TUMOR, WITH LYMPH NODE SAMPLING Specimen, including laterality: Right breast Procedure: Simple mastectomy Grade: 3 2 of 6 Duplicate copy FINAL for [name] [name], [name] [name] ([id number]) Microscopic Comment(continued) Tubule formation: 3 Nuclear pleomorphism: 3 Mitotic:2 Tumor size (gross measurement): 1.2 cm Margins: Invasive, distance to closest margin: 1.3 cm to the deep margin (gross measurement) In-situ, distance to closest margin: 1.3 cm to the deep margin (gross measurement) Lymphovascular  invasion: Present Ductal carcinoma in situ: Present Grade: High grade Extensive intraductal component: Not identified Lobular neoplasia: Not identified in specimen #6 Tumor focality: Unifocal Treatment effect: N/A Extent of tumor: Confined to breast parenchyma Lymph nodes: # examined: 1 Lymph nodes with metastasis: 0 Breast prognostic profile: Will be performed on the current case and the results reported separately. TNM: pT1c, pN0 7. BREAST, INVASIVE TUMOR, WITH LYMPH NODE SAMPLING Specimen, including laterality: Left breast Procedure: Simple mastectomy Grade: 2 Tubule formation: 3 Nuclear pleomorphism: 2 Mitotic:2 Tumor size (gross measurements) 1.4 cm, 1.2 cm, and 0.1 cm Margins: Invasive, distance to closest margin: 0.7 cm to the anterior/inferior soft tissue resection margin In-situ, distance to closest margin: Focally 0.2 cm to the anterior/inferior soft tissue resection margin (glass slide measurement) Lymphovascular invasion: Present Ductal carcinoma in situ: Present Grade: Intermediate grade Extensive intraductal component: Yes Lobular neoplasia: Yes (lobular carcinoma in situ) Tumor focality: Multiple foci Treatment effect: N/A Extent of tumor: Confined to breast parenchyma Lymph nodes: # examined: 8 Lymph nodes with metastasis: 0 Breast prognostic profile: [phone] Estrogen receptor: 100% strong staining intensity Progesterone receptor: 11% strong staining intensity Her 2 neu: Amplification was detected. The ratio was 6.50. 3 of 6 Duplicate copy FINAL for [name] [name], [name] [name] ([id number]) Microscopic Comment(continued) Ki-67: 80% TNM: mpT1c, pN0 Comments: In addition to the two grossly identified nodules, there is invasive carcinoma present in random tissue submitted from the inferior lateral quadrant. The three foci of carcinoma are morphologically similar.   RADIOGRAPHIC STUDIES:  Nm Pet Image Initial (pi) Skull Base To Thigh  05/12/2012   *RADIOLOGY REPORT*  Clinical Data: Initial treatment strategy for breast cancer.   NUCLEAR MEDICINE PET SKULL BASE TO THIGH  Fasting Blood Glucose:  143  Technique:  20 mCi F-18 FDG was injected intravenously. CT data was obtained and used for attenuation correction and anatomic localization only.  (This was not acquired as a diagnostic CT examination.) Additional exam technical data entered on technologist worksheet.  Comparison:  none  Findings:  Neck: No hypermetabolic lymph nodes in the neck.  Chest:  In the inferior medial aspect of the right breast there is a 10 mm nodule (image  99) with moderate  metabolic activity ( SUV max = 2.9).  No hypermetabolic mediastinal or hilar lymph nodes.  There is hypermetabolic activity in the thoracic inlet which is felt to be vascular in nature.  There is activity adjacent to the thoracic aorta which is also felt to be vascular may relate to the hemiazygos vein.  Abdomen/Pelvis:  No abnormal hypermetabolic activity within the liver, pancreas, adrenal glands, or spleen.  No hypermetabolic lymph nodes in the abdomen or pelvis.  Skeleton:  No focal hypermetabolic activity to suggest skeletal metastasis.  IMPRESSION:  1.  No evidence of distant breast cancer metastasis. 2.  Hypermetabolic nodule within the inferior medial right breast   Original Report Authenticated By: Genevive Bi, M.D.    Nm Sentinel Node Inj-no Rpt (breast)  05/14/2012  CLINICAL DATA: cancer left breast   Sulfur colloid was injected intradermally by the nuclear medicine  technologist for breast cancer sentinel node localization.     Dg Chest Port 1 View  05/14/2012  *RADIOLOGY REPORT*  Clinical Data: Status post Port-A-Cath placement  PORTABLE CHEST - 1 VIEW  Comparison: None.  Findings: The cardiac shadow is within normal limits.  Bilateral tissue expanders as well as surgical drains are identified.  A right chest wall port is seen. The catheter tip is at the cavoatrial junction.  No pneumothorax is  noted.  The lungs are clear bilaterally.  IMPRESSION: No evidence of pneumothorax following port placement.  Bilateral tissue expanders.   Original Report Authenticated By: Alcide Clever, M.D.    Dg Fluoro Guide Cv Line-no Report  05/14/2012  CLINICAL DATA: LEFT BREAST CANCER/PORT-A-CATH   FLOURO GUIDE CV LINE  Fluoroscopy was utilized by the requesting physician.  No radiographic  interpretation.      ASSESSMENT: 62 year old female with  #1 bilateral breast cancers on the right side she has triple-negative disease stage I left side stage I ER positive PR positive HER-2/neu positive disease. Patient is status post bilateral mastectomies. Of note on the right side the lymph node removed was not a sentinel lymph node. It is unclear what to do with this side in terms of whether or not to do further sentinel lymph node. Clinically patient seems to be doing well.  #2 we discussed her pathology in detail. We discussed chemotherapy to be given adjuvantly. We discussed Adriamycin Cytoxan given every 2 weeks for a total of 4 cycles followed by Taxol and Herceptin given weekly x12 weeks followed by Herceptin every 3 weeks to finish out a total of one year of therapy. We also discussed role of adjuvant hormonal therapy since the left side is ER positive.  #3 patient is now here (5/16) to begin cycle 1 day 1 of Adriamycin and Cytoxan to be given every 2 weeks for a total of 4 cycles. Patient is now status post 4 cycles of Adriamycin Cytoxan given from 07/02/2012 through 08/12/2012. Overall she tolerated the treatment well.  #4 patient will begin Taxol and Herceptin every week for a total of 12 weeks beginning 08/27/2012. Patient understands risks benefits and side effects. Status post one cycle on 08/27/2012 and then discontinued due to neuropathy.  #5 patient will switch to Abraxane and Herceptin 3 weeks on one week off to complete out 12 weeks of therapy.   PLAN:  #1 after receiving her first cycle of Taxol  and Herceptin patient has developed peripheral paresthesias pins and needles in the lower extremities weakness and fatigue.  #2 because of the neuropathy that has developed so  quickly we will plan on switching her Taxol to Abraxane. We discussed risks and benefits and side effects of this. She will begin this today.  #3 she will return in one week's time for followup.  #4 patient does need a echocardiogram and we will have to scheduled with Dr. Garnette Scheuermann  All questions were answered. The patient knows to call the clinic with any problems, questions or concerns. We can certainly see the patient much sooner if necessary.  I spent 25 minutes counseling the patient face to face. The total time spent in the appointment was 30 minutes.    Drue Second, MD Medical/Oncology [facility] [phone] (beeper) [phone] (Office)

## 2012-09-08 ENCOUNTER — Telehealth: Payer: Self-pay | Admitting: Medical Oncology

## 2012-09-08 NOTE — Telephone Encounter (Signed)
Patient called to report that she has had a few nose bleeds "nothing severe" states pinkish and small amount of blood, does not last long. Denies fever or other symptoms. Encouraged patient to call office should nose bleeds increase in frequency and/or amount. Patient verbalized understanding and will call office should symptoms worsen or experiences new symptoms.  Next sched appt 09/10/12 lab/MD/tx

## 2012-09-10 ENCOUNTER — Other Ambulatory Visit (HOSPITAL_BASED_OUTPATIENT_CLINIC_OR_DEPARTMENT_OTHER): Payer: BC Managed Care – PPO | Admitting: Lab

## 2012-09-10 ENCOUNTER — Ambulatory Visit (HOSPITAL_BASED_OUTPATIENT_CLINIC_OR_DEPARTMENT_OTHER): Payer: BC Managed Care – PPO | Admitting: Oncology

## 2012-09-10 ENCOUNTER — Encounter: Payer: Self-pay | Admitting: Oncology

## 2012-09-10 ENCOUNTER — Ambulatory Visit (HOSPITAL_BASED_OUTPATIENT_CLINIC_OR_DEPARTMENT_OTHER): Payer: BC Managed Care – PPO

## 2012-09-10 ENCOUNTER — Telehealth: Payer: Self-pay | Admitting: Oncology

## 2012-09-10 VITALS — BP 130/80 | HR 98 | Temp 97.9°F | Resp 20 | Ht 64.0 in | Wt 190.3 lb

## 2012-09-10 DIAGNOSIS — C50919 Malignant neoplasm of unspecified site of unspecified female breast: Secondary | ICD-10-CM

## 2012-09-10 DIAGNOSIS — C50911 Malignant neoplasm of unspecified site of right female breast: Secondary | ICD-10-CM

## 2012-09-10 DIAGNOSIS — R0981 Nasal congestion: Secondary | ICD-10-CM

## 2012-09-10 DIAGNOSIS — C50912 Malignant neoplasm of unspecified site of left female breast: Secondary | ICD-10-CM

## 2012-09-10 DIAGNOSIS — C50119 Malignant neoplasm of central portion of unspecified female breast: Secondary | ICD-10-CM

## 2012-09-10 DIAGNOSIS — J3489 Other specified disorders of nose and nasal sinuses: Secondary | ICD-10-CM

## 2012-09-10 DIAGNOSIS — Z5112 Encounter for antineoplastic immunotherapy: Secondary | ICD-10-CM

## 2012-09-10 LAB — COMPREHENSIVE METABOLIC PANEL (CC13)
ALT: 20 U/L (ref 0–55)
AST: 19 U/L (ref 5–34)
Albumin: 3.3 g/dL — ABNORMAL LOW (ref 3.5–5.0)
Alkaline Phosphatase: 92 U/L (ref 40–150)
BUN: 5.3 mg/dL — ABNORMAL LOW (ref 7.0–26.0)
CO2: 28 mEq/L (ref 22–29)
Calcium: 9.7 mg/dL (ref 8.4–10.4)
Chloride: 109 mEq/L (ref 98–109)
Creatinine: 0.7 mg/dL (ref 0.6–1.1)
Glucose: 191 mg/dl — ABNORMAL HIGH (ref 70–140)
Potassium: 3.8 mEq/L (ref 3.5–5.1)
Sodium: 146 mEq/L — ABNORMAL HIGH (ref 136–145)
Total Bilirubin: 0.29 mg/dL (ref 0.20–1.20)
Total Protein: 6.4 g/dL (ref 6.4–8.3)

## 2012-09-10 LAB — CBC WITH DIFFERENTIAL/PLATELET
BASO%: 1 % (ref 0.0–2.0)
Basophils Absolute: 0.1 10*3/uL (ref 0.0–0.1)
EOS%: 0.5 % (ref 0.0–7.0)
Eosinophils Absolute: 0 10*3/uL (ref 0.0–0.5)
HCT: 30.1 % — ABNORMAL LOW (ref 34.8–46.6)
HGB: 10.2 g/dL — ABNORMAL LOW (ref 11.6–15.9)
LYMPH%: 11.8 % — ABNORMAL LOW (ref 14.0–49.7)
MCH: 30.3 pg (ref 25.1–34.0)
MCHC: 33.7 g/dL (ref 31.5–36.0)
MCV: 89.8 fL (ref 79.5–101.0)
MONO#: 0.6 10*3/uL (ref 0.1–0.9)
MONO%: 9.7 % (ref 0.0–14.0)
NEUT#: 4.6 10*3/uL (ref 1.5–6.5)
NEUT%: 77 % — ABNORMAL HIGH (ref 38.4–76.8)
Platelets: 431 10*3/uL — ABNORMAL HIGH (ref 145–400)
RBC: 3.36 10*6/uL — ABNORMAL LOW (ref 3.70–5.45)
RDW: 17.4 % — ABNORMAL HIGH (ref 11.2–14.5)
WBC: 5.9 10*3/uL (ref 3.9–10.3)
lymph#: 0.7 10*3/uL — ABNORMAL LOW (ref 0.9–3.3)

## 2012-09-10 MED ORDER — ONDANSETRON 8 MG/50ML IVPB (CHCC)
8.0000 mg | Freq: Once | INTRAVENOUS | Status: AC
  Administered 2012-09-10: 8 mg via INTRAVENOUS

## 2012-09-10 MED ORDER — DIPHENHYDRAMINE HCL 25 MG PO CAPS
50.0000 mg | ORAL_CAPSULE | Freq: Once | ORAL | Status: AC
  Administered 2012-09-10: 50 mg via ORAL

## 2012-09-10 MED ORDER — HEPARIN SOD (PORK) LOCK FLUSH 100 UNIT/ML IV SOLN
500.0000 [IU] | Freq: Once | INTRAVENOUS | Status: AC | PRN
  Administered 2012-09-10: 500 [IU]
  Filled 2012-09-10: qty 5

## 2012-09-10 MED ORDER — PACLITAXEL PROTEIN-BOUND CHEMO INJECTION 100 MG
100.0000 mg/m2 | Freq: Once | INTRAVENOUS | Status: AC
  Administered 2012-09-10: 200 mg via INTRAVENOUS
  Filled 2012-09-10: qty 40

## 2012-09-10 MED ORDER — DEXAMETHASONE SODIUM PHOSPHATE 10 MG/ML IJ SOLN
10.0000 mg | Freq: Once | INTRAMUSCULAR | Status: AC
  Administered 2012-09-10: 10 mg via INTRAVENOUS

## 2012-09-10 MED ORDER — SODIUM CHLORIDE 0.9 % IJ SOLN
10.0000 mL | INTRAMUSCULAR | Status: DC | PRN
  Administered 2012-09-10: 10 mL
  Filled 2012-09-10: qty 10

## 2012-09-10 MED ORDER — ACETAMINOPHEN 325 MG PO TABS
650.0000 mg | ORAL_TABLET | Freq: Once | ORAL | Status: AC
  Administered 2012-09-10: 650 mg via ORAL

## 2012-09-10 MED ORDER — TRASTUZUMAB CHEMO INJECTION 440 MG
2.0000 mg/kg | Freq: Once | INTRAVENOUS | Status: AC
  Administered 2012-09-10: 168 mg via INTRAVENOUS
  Filled 2012-09-10: qty 8

## 2012-09-10 MED ORDER — SODIUM CHLORIDE 0.9 % IV SOLN
Freq: Once | INTRAVENOUS | Status: AC
  Administered 2012-09-10: 14:00:00 via INTRAVENOUS

## 2012-09-10 MED ORDER — FLUTICASONE PROPIONATE 50 MCG/ACT NA SUSP: 2.0000 | Freq: Every day | NASAL | Status: DC

## 2012-09-10 NOTE — Patient Instructions (Addendum)
[facility] Discharge Instructions for Patients Receiving Chemotherapy  Today you received the following chemotherapy agents: Abraxane, Herceptin  To help prevent nausea and vomiting after your treatment, we encourage you to take your nausea medication as directed by your physician.    If you develop nausea and vomiting that is not controlled by your nausea medication, call the clinic.   BELOW ARE SYMPTOMS THAT SHOULD BE REPORTED IMMEDIATELY:  *FEVER GREATER THAN 100.5 F  *CHILLS WITH OR WITHOUT FEVER  NAUSEA AND VOMITING THAT IS NOT CONTROLLED WITH YOUR NAUSEA MEDICATION  *UNUSUAL SHORTNESS OF BREATH  *UNUSUAL BRUISING OR BLEEDING  TENDERNESS IN MOUTH AND THROAT WITH OR WITHOUT PRESENCE OF ULCERS  *URINARY PROBLEMS  *BOWEL PROBLEMS  UNUSUAL RASH Items with * indicate a potential emergency and should be followed up as soon as possible.  Feel free to call the clinic you have any questions or concerns. The clinic phone number is [phone].

## 2012-09-10 NOTE — Progress Notes (Signed)
Discharged at 1625 with spouse in no distress.

## 2012-09-10 NOTE — Telephone Encounter (Signed)
Added genetics appt for 9/25 and scheduled appt for echo @ [facility] cardiology 7/30 @ 1:30pm. Echo at Dr. Erskine Emery Smith's office per 7/25 pof. All other appts remain the same. lmonvm @ home/work re above appts and mailed appts including echo appt. Pt was identified on work vm.

## 2012-09-16 ENCOUNTER — Other Ambulatory Visit: Payer: Self-pay | Admitting: Emergency Medicine

## 2012-09-17 ENCOUNTER — Ambulatory Visit (HOSPITAL_BASED_OUTPATIENT_CLINIC_OR_DEPARTMENT_OTHER): Payer: BC Managed Care – PPO

## 2012-09-17 ENCOUNTER — Ambulatory Visit (HOSPITAL_BASED_OUTPATIENT_CLINIC_OR_DEPARTMENT_OTHER): Payer: BC Managed Care – PPO | Admitting: Oncology

## 2012-09-17 ENCOUNTER — Other Ambulatory Visit (HOSPITAL_BASED_OUTPATIENT_CLINIC_OR_DEPARTMENT_OTHER): Payer: BC Managed Care – PPO | Admitting: Lab

## 2012-09-17 ENCOUNTER — Encounter: Payer: Self-pay | Admitting: Oncology

## 2012-09-17 VITALS — BP 122/84 | HR 79 | Temp 97.9°F | Resp 20 | Ht 64.0 in | Wt 188.1 lb

## 2012-09-17 DIAGNOSIS — C50919 Malignant neoplasm of unspecified site of unspecified female breast: Secondary | ICD-10-CM

## 2012-09-17 DIAGNOSIS — C50911 Malignant neoplasm of unspecified site of right female breast: Secondary | ICD-10-CM

## 2012-09-17 DIAGNOSIS — C50119 Malignant neoplasm of central portion of unspecified female breast: Secondary | ICD-10-CM

## 2012-09-17 DIAGNOSIS — Z171 Estrogen receptor negative status [ER-]: Secondary | ICD-10-CM

## 2012-09-17 DIAGNOSIS — C50912 Malignant neoplasm of unspecified site of left female breast: Secondary | ICD-10-CM

## 2012-09-17 DIAGNOSIS — R5381 Other malaise: Secondary | ICD-10-CM

## 2012-09-17 DIAGNOSIS — Z170 Estrogen receptor positive status [ER+]: Secondary | ICD-10-CM

## 2012-09-17 DIAGNOSIS — G589 Mononeuropathy, unspecified: Secondary | ICD-10-CM

## 2012-09-17 DIAGNOSIS — Z5112 Encounter for antineoplastic immunotherapy: Secondary | ICD-10-CM

## 2012-09-17 DIAGNOSIS — Z5111 Encounter for antineoplastic chemotherapy: Secondary | ICD-10-CM

## 2012-09-17 LAB — CBC WITH DIFFERENTIAL/PLATELET
BASO%: 0.3 % (ref 0.0–2.0)
Basophils Absolute: 0 10*3/uL (ref 0.0–0.1)
EOS%: 2.1 % (ref 0.0–7.0)
Eosinophils Absolute: 0.1 10*3/uL (ref 0.0–0.5)
HCT: 30.9 % — ABNORMAL LOW (ref 34.8–46.6)
HGB: 10.4 g/dL — ABNORMAL LOW (ref 11.6–15.9)
LYMPH%: 15.4 % (ref 14.0–49.7)
MCH: 30.6 pg (ref 25.1–34.0)
MCHC: 33.8 g/dL (ref 31.5–36.0)
MCV: 90.5 fL (ref 79.5–101.0)
MONO#: 0.6 10*3/uL (ref 0.1–0.9)
MONO%: 11.9 % (ref 0.0–14.0)
NEUT#: 3.8 10*3/uL (ref 1.5–6.5)
NEUT%: 70.3 % (ref 38.4–76.8)
Platelets: 335 10*3/uL (ref 145–400)
RBC: 3.41 10*6/uL — ABNORMAL LOW (ref 3.70–5.45)
RDW: 17.5 % — ABNORMAL HIGH (ref 11.2–14.5)
WBC: 5.4 10*3/uL (ref 3.9–10.3)
lymph#: 0.8 10*3/uL — ABNORMAL LOW (ref 0.9–3.3)

## 2012-09-17 LAB — COMPREHENSIVE METABOLIC PANEL (CC13)
ALT: 18 U/L (ref 0–55)
AST: 20 U/L (ref 5–34)
Albumin: 3.4 g/dL — ABNORMAL LOW (ref 3.5–5.0)
Alkaline Phosphatase: 91 U/L (ref 40–150)
BUN: 6.4 mg/dL — ABNORMAL LOW (ref 7.0–26.0)
CO2: 28 mEq/L (ref 22–29)
Calcium: 9.9 mg/dL (ref 8.4–10.4)
Chloride: 106 mEq/L (ref 98–109)
Creatinine: 0.7 mg/dL (ref 0.6–1.1)
Glucose: 94 mg/dl (ref 70–140)
Potassium: 4.1 mEq/L (ref 3.5–5.1)
Sodium: 144 mEq/L (ref 136–145)
Total Bilirubin: 0.33 mg/dL (ref 0.20–1.20)
Total Protein: 6.7 g/dL (ref 6.4–8.3)

## 2012-09-17 MED ORDER — ACETAMINOPHEN 325 MG PO TABS
650.0000 mg | ORAL_TABLET | Freq: Once | ORAL | Status: AC
  Administered 2012-09-17: 650 mg via ORAL

## 2012-09-17 MED ORDER — SODIUM CHLORIDE 0.9 % IJ SOLN
10.0000 mL | INTRAMUSCULAR | Status: DC | PRN
  Administered 2012-09-17: 10 mL
  Filled 2012-09-17: qty 10

## 2012-09-17 MED ORDER — PACLITAXEL PROTEIN-BOUND CHEMO INJECTION 100 MG
100.0000 mg/m2 | Freq: Once | INTRAVENOUS | Status: AC
  Administered 2012-09-17: 200 mg via INTRAVENOUS
  Filled 2012-09-17: qty 40

## 2012-09-17 MED ORDER — DIPHENHYDRAMINE HCL 25 MG PO CAPS
50.0000 mg | ORAL_CAPSULE | Freq: Once | ORAL | Status: AC
  Administered 2012-09-17: 50 mg via ORAL

## 2012-09-17 MED ORDER — SODIUM CHLORIDE 0.9 % IV SOLN
Freq: Once | INTRAVENOUS | Status: AC
  Administered 2012-09-17: 10:00:00 via INTRAVENOUS

## 2012-09-17 MED ORDER — DEXAMETHASONE SODIUM PHOSPHATE 10 MG/ML IJ SOLN
10.0000 mg | Freq: Once | INTRAMUSCULAR | Status: AC
  Administered 2012-09-17: 10 mg via INTRAVENOUS

## 2012-09-17 MED ORDER — HEPARIN SOD (PORK) LOCK FLUSH 100 UNIT/ML IV SOLN
500.0000 [IU] | Freq: Once | INTRAVENOUS | Status: AC | PRN
  Administered 2012-09-17: 500 [IU]
  Filled 2012-09-17: qty 5

## 2012-09-17 MED ORDER — ONDANSETRON 8 MG/50ML IVPB (CHCC)
8.0000 mg | Freq: Once | INTRAVENOUS | Status: AC
  Administered 2012-09-17: 8 mg via INTRAVENOUS

## 2012-09-17 MED ORDER — TRASTUZUMAB CHEMO INJECTION 440 MG
2.0000 mg/kg | Freq: Once | INTRAVENOUS | Status: AC
  Administered 2012-09-17: 168 mg via INTRAVENOUS
  Filled 2012-09-17: qty 8

## 2012-09-17 NOTE — Patient Instructions (Addendum)
Proceed with chemotherapy today  I will see you back on 8/15 with chemotherapy and MD visits

## 2012-09-17 NOTE — Patient Instructions (Addendum)
[facility] Discharge Instructions for Patients Receiving Chemotherapy  Today you received the following chemotherapy agents Abraxane/Herceptin.  To help prevent nausea and vomiting after your treatment, we encourage you to take your nausea medication as prescribed.   If you develop nausea and vomiting that is not controlled by your nausea medication, call the clinic.   BELOW ARE SYMPTOMS THAT SHOULD BE REPORTED IMMEDIATELY:  *FEVER GREATER THAN 100.5 F  *CHILLS WITH OR WITHOUT FEVER  NAUSEA AND VOMITING THAT IS NOT CONTROLLED WITH YOUR NAUSEA MEDICATION  *UNUSUAL SHORTNESS OF BREATH  *UNUSUAL BRUISING OR BLEEDING  TENDERNESS IN MOUTH AND THROAT WITH OR WITHOUT PRESENCE OF ULCERS  *URINARY PROBLEMS  *BOWEL PROBLEMS  UNUSUAL RASH Items with * indicate a potential emergency and should be followed up as soon as possible.  Feel free to call the clinic you have any questions or concerns. The clinic phone number is [phone].

## 2012-09-24 ENCOUNTER — Ambulatory Visit: Payer: BC Managed Care – PPO | Admitting: Oncology

## 2012-09-24 ENCOUNTER — Other Ambulatory Visit: Payer: BC Managed Care – PPO | Admitting: Lab

## 2012-09-26 NOTE — Progress Notes (Signed)
OFFICE PROGRESS NOTE  CC  [name], MD [address] [place] [address] Dr. 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 Patient is status post bilateral mastectomy with immediate reconstruction  STAGE:  Right breast (mastectomy)  T1cN0 1.2 cm ER-/PR-/her-/ki-67 92% 0/1 nodes positive  Left breast (mastectomy with snl) T1cN0 1.4 cm, 1.2 cm, 0.1c ER 100%, PR 11%, her2neu positive 6.50, Ki-67 80%   PRIOR THERAPY:  #1screening mammogram performed that showed 2 areas of concern on the left side.calcifications were noted in the left. In the right breast a possible mass warranted further evaluation.   #2 On 03/29/2012 patient underwent a bilateral diagnostic mammogram and right breast ultrasound. A spot magnification images demonstrate suspicious group of pleomorphic microcalcifications over the outer lower left breast with additional suspicious group of pleomorphic microcalcifications over the outer midportion of the left periareolar region. Spot compression images of the right breast demonstrate persistence of a 1 cm density at the edge of the film in the deep third of the right inner breast.   #3Ultrasound performed showed no focal abnormality over the entire in her right breast to correspond to the mammographic density. Patient was recommended stereotactic core needle biopsy of the 2 groups of suspicious left breast microcalcifications. Because patient and her husband wanted bilateral mastectomies MRI was not performed.on 04/15/2012 patient had needle core biopsy performed of the 2 areas in the left breast. The left needle core biopsy in the lower breast revealed ductal carcinoma in situ with associated comedo necrosis and calcifications with the in situ carcinoma. It was ER +100% PR +12%. 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 Tumor was ER +100% PR +11% proliferation marker Ki-67 elevated at 80% HER-2/neu showed amplification with a ratio of 6.50.   #4 patient is now status post bilateral mastectomies performed on 05/14/2012 with immediate reconstruction. The final pathology revealed bilateral breast cancers. In the right breast tissue is noted to have a 1.2 cm invasive ductal carcinoma high-grade ER negative PR negative HER-2/neu negative with Ki-67 92% one node was negative for metastatic disease but this was not the sentinel lymph node. On the left side the patient had multifocal disease measuring 1.4 cm, 1.2 cm, 0.1 cm. Tumor was ER +100% PR +11% HER-2/neu positive with a ratio 6.50. Ki-67 was 80% 8 sentinel nodes were negative for metastatic disease.  #5 patient has had immediate reconstruction with expanders bilaterally.  #6s/p adjuvant chemotherapy initially consisting of Adriamycin Cytoxan every 2 weeks for a total of 4 cycles. Given 07/02/12 - 08/12/12  #7This will then be followed by Taxol and Herceptin to be given weekly for 12 weeks time.beginning 08/27/12. This was discontinued due to neuropathy.  #8 Abraxane and Herceptin days 1, day 8, day 15 on a 28 day cycle. Beginning 09/03/2012   CURRENT THERAPY: Abraxane and Herceptin days 8,   INTERVAL HISTORY: [name] [name] 62 y.o. female returns for followup visit. overall patient is doing well. Her neuropathy has improved since coming off of Taxol and beginning Abraxane She is weak tired and fatigued. She has no nausea or vomiting no fevers chills or night sweats. She has been eating well. No headaches no diarrhea or constipation no changes in her bladder habits. Remainder of the 10 point review of systems is  negative.   MEDICAL HISTORY: Past Medical History  Diagnosis Date  . Heart murmur   . Diabetes mellitus without complication   . Osteopenia   .  Cancer     breast  . Bronchitis   . Pneumonia   . Hiatal hernia   . Sinus problem   . Diabetes mellitus   . Breast cancer 04/15/12    left-biopsy  . Breast cancer, right breast 05/14/12    right mastectomy    ALLERGIES:  is allergic to darvon and penicillins.  MEDICATIONS:  Current Outpatient Prescriptions  Medication Sig Dispense Refill  . Ascorbic Acid (VITAMIN C) 500 MG tablet Take 500 mg by mouth daily.        . Biotin 10 MG TABS Take by mouth. 4000 mcg daily      . Calcium Citrate (CITRACAL PO) Take by mouth.      . Cholecalciferol (VITAMIN D PO) Take 1 tablet by mouth daily.      Marland Kitchen docusate sodium 100 MG CAPS Take 100 mg by mouth 2 (two) times daily.  30 capsule  1  . KRILL OIL PO Take by mouth.      . lidocaine-prilocaine (EMLA) cream Apply topically as needed.  30 [name]  1  . Multiple Vitamin (MULTIVITAMIN PO) Take 1 tablet by mouth daily.       . pantoprazole (PROTONIX) 40 MG tablet Take 40 mg by mouth daily.        . Probiotic Product (PROBIOTIC DAILY PO) Take by mouth daily.      . rosuvastatin (CRESTOR) 5 MG tablet Take 5 mg by mouth every Monday, Wednesday, and Friday.      . sitaGLIPtin (JANUVIA) 100 MG tablet Take 100 mg by mouth daily.      Marland Kitchen UNABLE TO FIND Apply 1 Device topically once. Cranial prosthesis r/t chemotherapy treatment  1 Device  0  . calcitonin, salmon, (MIACALCIN/FORTICAL) 200 UNIT/ACT nasal spray Place 1 spray into the nose daily.      . fluticasone (FLONASE) 50 MCG/ACT nasal spray Place 2 sprays into the nose daily.  16 [name]  2   No current facility-administered medications for this visit.    SURGICAL HISTORY:  Past Surgical History  Procedure Laterality Date  . Cesarean section  1981, 1983  . Tummy tuck  1999  . Tonsillectomy    . Foot surgery    . Gastric bypass    . Wound debridement  01/14/2011    Procedure: DEBRIDEMENT ABDOMINAL WOUND;  Surgeon: Ernestene Mention, MD;  Location: [facility];  Service: General;  Laterality: N/A;   wound debridement and debridement on the abdomen  . Cosmetic surgery    . Total mastectomy Bilateral 05/14/2012    Procedure: TOTAL MASTECTOMY;  Surgeon: Ernestene Mention, MD;  Location: [facility] OR;  Service: General;  Laterality: Bilateral;  . Axillary sentinel node biopsy Left 05/14/2012    Procedure: AXILLARY SENTINEL NODE BIOPSY;  Surgeon: Ernestene Mention, MD;  Location: [facility] OR;  Service: General;  Laterality: Left;  . Portacath placement N/A 05/14/2012    Procedure: INSERTION PORT-A-CATH;  Surgeon: Ernestene Mention, MD;  Location[facility] OR;  Service: General;  Laterality: N/A;  . Breast reconstruction with placement of tissue expander and flex hd (acellular hydrated dermis) Bilateral 05/14/2012    Procedure: BREAST RECONSTRUCTION WITH PLACEMENT OF TISSUE EXPANDER AND FLEX HD (ACELLULAR HYDRATED DERMIS);  Surgeon: Etter Sjogren, MD;  Location: [facility] OR;  Service: Plastics;  Laterality: Bilateral;  .  Bunionectomy      REVIEW OF SYSTEMS:  Pertinent items are noted in HPI.   HEALTH MAINTENANCE:   PHYSICAL EXAMINATION: Blood pressure 130/80, pulse 98, temperature 97.9 F (36.6 C), temperature source Oral, resp. rate 20, height 5\' 4"  (1.626 m), weight 190 lb 4.8 oz (86.32 kg). Body mass index is 32.65 kg/(m^2). ECOG PERFORMANCE STATUS: 0 - Asymptomatic Well-developed well-nourished very pleasant female in no acute distress HEENT exam EOMI PERRLA sclerae anicteric no conjunctival pallor oral mucosa is moist neck is supple lungs are clear cardiovascular regular rate rhythm abdomen soft nontender no HSM extremities no edema neuro is nonfocal Bilateral expanders in place mild tenderness     LABORATORY DATA: Lab Results  Component Value Date   WBC 5.4 09/17/2012   HGB 10.4* 09/17/2012   HCT 30.9* 09/17/2012   MCV 90.5 09/17/2012   PLT 335 09/17/2012      Chemistry      Component Value Date/Time   NA 144 09/17/2012 0829   NA 139 05/17/2012 0530   K 4.1 09/17/2012 0829   K 3.7 05/17/2012 0530   CL 104 07/30/2012  0955   CL 100 05/17/2012 0530   CO2 28 09/17/2012 0829   CO2 33* 05/17/2012 0530   BUN 6.4* 09/17/2012 0829   BUN 4* 05/17/2012 0530   CREATININE 0.7 09/17/2012 0829   CREATININE 0.56 05/17/2012 0530      Component Value Date/Time   CALCIUM 9.9 09/17/2012 0829   CALCIUM 8.7 05/17/2012 0530   ALKPHOS 91 09/17/2012 0829   ALKPHOS 103 05/10/2012 1440   AST 20 09/17/2012 0829   AST 25 05/10/2012 1440   ALT 18 09/17/2012 0829   ALT 17 05/10/2012 1440   BILITOT 0.33 09/17/2012 0829   BILITOT 0.4 05/10/2012 1440    ADDITIONAL INFORMATION: 6. PROGNOSTIC INDICATORS - ACIS Results: IMMUNOHISTOCHEMICAL AND MORPHOMETRIC ANALYSIS BY THE AUTOMATED CELLULAR IMAGING SYSTEM (ACIS) Estrogen Receptor: 0%, NEGATIVE Progesterone Receptor: 0%, NEGATIVE Proliferation Marker Ki67: 92% COMMENT: The negative hormone receptor study(ies) in this case have an internal positive control. REFERENCE RANGE ESTROGEN RECEPTOR NEGATIVE <1% POSITIVE =>1% PROGESTERONE RECEPTOR NEGATIVE <1% POSITIVE =>1% All controls stained appropriately Pecola Leisure MD Pathologist, Electronic Signature ( Signed 05/21/2012) 6. CHROMOGENIC IN-SITU HYBRIDIZATION Results: HER-2/NEU BY CISH - NO AMPLIFICATION OF HER-2 DETECTED. 1 of 6 Duplicate copy FINAL for [name] [name], [name] [name] [phone]) ADDITIONAL INFORMATION:(continued) RESULT RATIO OF HER2: CEP 17 SIGNALS 1.19 AVERAGE HER2 COPY NUMBER PER CELL 1.85 REFERENCE RANGE NEGATIVE HER2/Chr17 Ratio <2.0 and Average HER2 copy number <4.0 EQUIVOCAL HER2/Chr17 Ratio <2.0 and Average HER2 copy number 4.0 and <6.0 POSITIVE HER2/Chr17 Ratio >=2.0 and/or Average HER2 copy number >=6.0 Pecola Leisure MD Pathologist, Electronic Signature ( Signed 05/20/2012) FINAL DIAGNOSIS Diagnosis 1. Lymph node, sentinel, biopsy, Left axillary - THERE IS NO EVIDENCE OF CARCINOMA IN 1 OF 1 LYMPH NODE (0/1). 2. Lymph node, sentinel, biopsy, Left axillary - THERE IS NO EVIDENCE OF CARCINOMA IN 1 OF 1 LYMPH NODE  (0/1). 3. Lymph node, sentinel, biopsy, Left axillary - THERE IS NO EVIDENCE OF CARCINOMA IN 1 OF 1 LYMPH NODE (0/1). 4. Lymph node, sentinel, biopsy, Left axillary - THERE IS NO EVIDENCE OF CARCINOMA IN 1 OF 1 LYMPH NODE (0/1). 5. Lymph node, sentinel, biopsy, Left axillary - THERE IS NO EVIDENCE OF CARCINOMA IN 1 OF 1 LYMPH NODE (0/1). 6. Breast, simple mastectomy, Right - INVASIVE DUCTAL CARCINOMA, GRADE III/III, SPANNING 1.2 CM. - DUCTAL CARCINOMA IN SITU, HIGH GRADE. - LYMPHOVASCULAR INVASION IS IDENTIFIED. -  THERE IS NO EVIDENCE OF CARCINOMA IN 1 OF 1 LYMPH NODE (0/1). - SEE ONCOLOGY TABLE BELOW. 7. Breast, simple mastectomy, Left - INVASIVE DUCTAL CARCINOMA, AT LEAST THREE FOCI, GRADE II/III, SPANNING 1.4 CM, 1.2 CM, AND 0.1 CM. - DUCTAL CARCINOMA IN SITU, INTERMEDIATE GRADE. - LYMPHOVASCULAR INVASION IS IDENTIFIED. - DUCTAL CARCINOMA IN SITU IS FOCALLY 0.2 CM TO THE ANTERIOR/INFERIOR SOFT TISSUE RESECTION MARGIN. - LOBULAR NEOPLASIA (LOBULAR CARCINOMA IN SITU). - THERE IS NO EVIDENCE OF CARCINOMA IN 2 OF 2 LYMPH NODES (0/2). - SEE ONCOLOGY TABLE BELOW. 8. Lymph node, biopsy, Left axillary - THERE IS NO EVIDENCE OF CARCINOMA IN 1 OF 1 LYMPH NODE (0/1). Microscopic Comment 6. BREAST, INVASIVE TUMOR, WITH LYMPH NODE SAMPLING Specimen, including laterality: Right breast Procedure: Simple mastectomy Grade: 3 2 of 6 Duplicate copy FINAL for [name] [name], [name] [name] ([id number]) Microscopic Comment(continued) Tubule formation: 3 Nuclear pleomorphism: 3 Mitotic:2 Tumor size (gross measurement): 1.2 cm Margins: Invasive, distance to closest margin: 1.3 cm to the deep margin (gross measurement) In-situ, distance to closest margin: 1.3 cm to the deep margin (gross measurement) Lymphovascular invasion: Present Ductal carcinoma in situ: Present Grade: High grade Extensive intraductal component: Not identified Lobular neoplasia: Not identified in specimen #6 Tumor focality:  Unifocal Treatment effect: N/A Extent of tumor: Confined to breast parenchyma Lymph nodes: # examined: 1 Lymph nodes with metastasis: 0 Breast prognostic profile: Will be performed on the current case and the results reported separately. TNM: pT1c, pN0 7. BREAST, INVASIVE TUMOR, WITH LYMPH NODE SAMPLING Specimen, including laterality: Left breast Procedure: Simple mastectomy Grade: 2 Tubule formation: 3 Nuclear pleomorphism: 2 Mitotic:2 Tumor size (gross measurements) 1.4 cm, 1.2 cm, and 0.1 cm Margins: Invasive, distance to closest margin: 0.7 cm to the anterior/inferior soft tissue resection margin In-situ, distance to closest margin: Focally 0.2 cm to the anterior/inferior soft tissue resection margin (glass slide measurement) Lymphovascular invasion: Present Ductal carcinoma in situ: Present Grade: Intermediate grade Extensive intraductal component: Yes Lobular neoplasia: Yes (lobular carcinoma in situ) Tumor focality: Multiple foci Treatment effect: N/A Extent of tumor: Confined to breast parenchyma Lymph nodes: # examined: 8 Lymph nodes with metastasis: 0 Breast prognostic profile: [phone] Estrogen receptor: 100% strong staining intensity Progesterone receptor: 11% strong staining intensity Her 2 neu: Amplification was detected. The ratio was 6.50. 3 of 6 Duplicate copy FINAL for [name] [name], [name] [name] ([id number]) Microscopic Comment(continued) Ki-67: 80% TNM: mpT1c, pN0 Comments: In addition to the two grossly identified nodules, there is invasive carcinoma present in random tissue submitted from the inferior lateral quadrant. The three foci of carcinoma are morphologically similar.   RADIOGRAPHIC STUDIES:  Nm Pet Image Initial (pi) Skull Base To Thigh  05/12/2012  *RADIOLOGY REPORT*  Clinical Data: Initial treatment strategy for breast cancer.   NUCLEAR MEDICINE PET SKULL BASE TO THIGH  Fasting Blood Glucose:  143  Technique:  20 mCi F-18 FDG was injected  intravenously. CT data was obtained and used for attenuation correction and anatomic localization only.  (This was not acquired as a diagnostic CT examination.) Additional exam technical data entered on technologist worksheet.  Comparison:  none  Findings:  Neck: No hypermetabolic lymph nodes in the neck.  Chest:  In the inferior medial aspect of the right breast there is a 10 mm nodule (image 99) with moderate  metabolic activity ( SUV max = 2.9).  No hypermetabolic mediastinal or hilar lymph nodes.  There is hypermetabolic activity in the thoracic inlet which is felt to be vascular in nature.  There  is activity adjacent to the thoracic aorta which is also felt to be vascular may relate to the hemiazygos vein.  Abdomen/Pelvis:  No abnormal hypermetabolic activity within the liver, pancreas, adrenal glands, or spleen.  No hypermetabolic lymph nodes in the abdomen or pelvis.  Skeleton:  No focal hypermetabolic activity to suggest skeletal metastasis.  IMPRESSION:  1.  No evidence of distant breast cancer metastasis. 2.  Hypermetabolic nodule within the inferior medial right breast   Original Report Authenticated By: Genevive Bi, M.D.    Nm Sentinel Node Inj-no Rpt (breast)  05/14/2012  CLINICAL DATA: cancer left breast   Sulfur colloid was injected intradermally by the nuclear medicine  technologist for breast cancer sentinel node localization.     Dg Chest Port 1 View  05/14/2012  *RADIOLOGY REPORT*  Clinical Data: Status post Port-A-Cath placement  PORTABLE CHEST - 1 VIEW  Comparison: None.  Findings: The cardiac shadow is within normal limits.  Bilateral tissue expanders as well as surgical drains are identified.  A right chest wall port is seen. The catheter tip is at the cavoatrial junction.  No pneumothorax is noted.  The lungs are clear bilaterally.  IMPRESSION: No evidence of pneumothorax following port placement.  Bilateral tissue expanders.   Original Report Authenticated By: Alcide Clever, M.D.     Dg Fluoro Guide Cv Line-no Report  05/14/2012  CLINICAL DATA: LEFT BREAST CANCER/PORT-A-CATH   FLOURO GUIDE CV LINE  Fluoroscopy was utilized by the requesting physician.  No radiographic  interpretation.      ASSESSMENT: 62 year old female with  #1 bilateral breast cancers on the right side she has triple-negative disease stage I left side stage I ER positive PR positive HER-2/neu positive disease. Patient is status post bilateral mastectomies. Of note on the right side the lymph node removed was not a sentinel lymph node. It is unclear what to do with this side in terms of whether or not to do further sentinel lymph node. Clinically patient seems to be doing well.  #2 we discussed her pathology in detail. We discussed chemotherapy to be given adjuvantly. We discussed Adriamycin Cytoxan given every 2 weeks for a total of 4 cycles followed by Taxol and Herceptin given weekly x12 weeks followed by Herceptin every 3 weeks to finish out a total of one year of therapy. We also discussed role of adjuvant hormonal therapy since the left side is ER positive.  #3 patient is now here (5/16) to begin cycle 1 day 1 of Adriamycin and Cytoxan to be given every 2 weeks for a total of 4 cycles. Patient is now status post 4 cycles of Adriamycin Cytoxan given from 07/02/2012 through 08/12/2012. Overall she tolerated the treatment well.  #4 patient will begin Taxol and Herceptin every week for a total of 12 weeks beginning 08/27/2012. Patient understands risks benefits and side effects. Status post one cycle on 08/27/2012 and then discontinued due to neuropathy.  #5 patient will switch to Abraxane and Herceptin 3 weeks on one week off to complete out 12 weeks of therapy.she began Abraxane and Herceptin starting on 09/03/2012. A total of 4 cycles is planned   PLAN:  #1 patient will proceed with day 8 cycle 1 of Abraxane and Herceptin today.She is tolerating this well.  #2 she will return in one week's time for  followup.  #3 patient does need a echocardiogram and we will have to scheduled with Dr. Garnette Scheuermann  All questions were answered. The patient knows to call the clinic with  any problems, questions or concerns. We can certainly see the patient much sooner if necessary.  I spent 25 minutes counseling the patient face to face. The total time spent in the appointment was 30 minutes.    Drue Second, MD Medical/Oncology [facility] [phone] (beeper) [phone] (Office)

## 2012-09-26 NOTE — Progress Notes (Signed)
OFFICE PROGRESS NOTE  CC  [name], MD [address] [place] [address] Dr. 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 Patient is status post bilateral mastectomy with immediate reconstruction  STAGE:  Right breast (mastectomy)  T1cN0 1.2 cm ER-/PR-/her-/ki-67 92% 0/1 nodes positive  Left breast (mastectomy with snl) T1cN0 1.4 cm, 1.2 cm, 0.1c ER 100%, PR 11%, her2neu positive 6.50, Ki-67 80%   PRIOR THERAPY:  #1screening mammogram performed that showed 2 areas of concern on the left side.calcifications were noted in the left. In the right breast a possible mass warranted further evaluation.   #2 On 03/29/2012 patient underwent a bilateral diagnostic mammogram and right breast ultrasound. A spot magnification images demonstrate suspicious group of pleomorphic microcalcifications over the outer lower left breast with additional suspicious group of pleomorphic microcalcifications over the outer midportion of the left periareolar region. Spot compression images of the right breast demonstrate persistence of a 1 cm density at the edge of the film in the deep third of the right inner breast.   #3Ultrasound performed showed no focal abnormality over the entire in her right breast to correspond to the mammographic density. Patient was recommended stereotactic core needle biopsy of the 2 groups of suspicious left breast microcalcifications. Because patient and her husband wanted bilateral mastectomies MRI was not performed.on 04/15/2012 patient had needle core biopsy performed of the 2 areas in the left breast. The left needle core biopsy in the lower breast revealed ductal carcinoma in situ with associated comedo necrosis and calcifications with the in situ carcinoma. It was ER +100% PR +12%. 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 Tumor was ER +100% PR +11% proliferation marker Ki-67 elevated at 80% HER-2/neu showed amplification with a ratio of 6.50.   #4 patient is now status post bilateral mastectomies performed on 05/14/2012 with immediate reconstruction. The final pathology revealed bilateral breast cancers. In the right breast tissue is noted to have a 1.2 cm invasive ductal carcinoma high-grade ER negative PR negative HER-2/neu negative with Ki-67 92% one node was negative for metastatic disease but this was not the sentinel lymph node. On the left side the patient had multifocal disease measuring 1.4 cm, 1.2 cm, 0.1 cm. Tumor was ER +100% PR +11% HER-2/neu positive with a ratio 6.50. Ki-67 was 80% 8 sentinel nodes were negative for metastatic disease.  #5 patient has had immediate reconstruction with expanders bilaterally.  #6s/p adjuvant chemotherapy initially consisting of Adriamycin Cytoxan every 2 weeks for a total of 4 cycles. Given 07/02/12 - 08/12/12  #7This will then be followed by Taxol and Herceptin to be given weekly for 12 weeks time.beginning 08/27/12. This was discontinued due to neuropathy.  #8 Abraxane and Herceptin days 1, day 8, day 15 on a 28 day cycle. Beginning 09/03/2012   CURRENT THERAPY: Abraxane and Herceptin days 15,   INTERVAL HISTORY: [name] [name] 62 y.o. female returns for followup visit. overall patient is doing well. Her neuropathy has improved since coming off of Taxol and beginning Abraxane She is weak tired and fatigued. She has no nausea or vomiting no fevers chills or night sweats. She has been eating well. No headaches no diarrhea or constipation no changes in her bladder habits. Remainder of the 10 point review of systems is  negative.   MEDICAL HISTORY: Past Medical History  Diagnosis Date  . Heart murmur   . Diabetes mellitus without complication   . Osteopenia   .  Cancer     breast  . Bronchitis   . Pneumonia   . Hiatal hernia   . Sinus problem   . Diabetes mellitus   . Breast cancer 04/15/12    left-biopsy  . Breast cancer, right breast 05/14/12    right mastectomy    ALLERGIES:  is allergic to darvon and penicillins.  MEDICATIONS:  Current Outpatient Prescriptions  Medication Sig Dispense Refill  . Ascorbic Acid (VITAMIN C) 500 MG tablet Take 500 mg by mouth daily.        . Biotin 10 MG TABS Take by mouth. 4000 mcg daily      . calcitonin, salmon, (MIACALCIN/FORTICAL) 200 UNIT/ACT nasal spray Place 1 spray into the nose daily.      . Calcium Citrate (CITRACAL PO) Take by mouth.      . Cholecalciferol (VITAMIN D PO) Take 1 tablet by mouth daily.      Marland Kitchen docusate sodium 100 MG CAPS Take 100 mg by mouth 2 (two) times daily.  30 capsule  1  . fluticasone (FLONASE) 50 MCG/ACT nasal spray Place 2 sprays into the nose daily.  16 g  2  . KRILL OIL PO Take by mouth.      . lidocaine-prilocaine (EMLA) cream Apply topically as needed.  30 g  1  . Multiple Vitamin (MULTIVITAMIN PO) Take 1 tablet by mouth daily.       . pantoprazole (PROTONIX) 40 MG tablet Take 40 mg by mouth daily.        . Probiotic Product (PROBIOTIC DAILY PO) Take by mouth daily.      . rosuvastatin (CRESTOR) 5 MG tablet Take 5 mg by mouth every Monday, Wednesday, and Friday.      . sitaGLIPtin (JANUVIA) 100 MG tablet Take 100 mg by mouth daily.      Marland Kitchen UNABLE TO FIND Apply 1 Device topically once. Cranial prosthesis r/t chemotherapy treatment  1 Device  0   No current facility-administered medications for this visit.    SURGICAL HISTORY:  Past Surgical History  Procedure Laterality Date  . Cesarean section  1981, 1983  . Tummy tuck  1999  . Tonsillectomy    . Foot surgery    . Gastric bypass    . Wound debridement  01/14/2011    Procedure: DEBRIDEMENT ABDOMINAL WOUND;  Surgeon: Ernestene Mention, MD;  Location: [facility];  Service: General;  Laterality: N/A;   wound debridement and debridement on the abdomen  . Cosmetic surgery    . Total mastectomy Bilateral 05/14/2012    Procedure: TOTAL MASTECTOMY;  Surgeon: Ernestene Mention, MD;  Location: [facility] OR;  Service: General;  Laterality: Bilateral;  . Axillary sentinel node biopsy Left 05/14/2012    Procedure: AXILLARY SENTINEL NODE BIOPSY;  Surgeon: Ernestene Mention, MD;  Location: [facility] OR;  Service: General;  Laterality: Left;  . Portacath placement N/A 05/14/2012    Procedure: INSERTION PORT-A-CATH;  Surgeon: Ernestene Mention, MD;  Location: [facility] OR;  Service: General;  Laterality: N/A;  . Breast reconstruction with placement of tissue expander and flex hd (acellular hydrated dermis) Bilateral 05/14/2012    Procedure: BREAST RECONSTRUCTION WITH PLACEMENT OF TISSUE EXPANDER AND FLEX HD (ACELLULAR HYDRATED DERMIS);  Surgeon: Etter Sjogren, MD;  Location: [facility] OR;  Service: Plastics;  Laterality: Bilateral;  .  Bunionectomy      REVIEW OF SYSTEMS:  Pertinent items are noted in HPI.   HEALTH MAINTENANCE:   PHYSICAL EXAMINATION: Blood pressure 122/84, pulse 79, temperature 97.9 F (36.6 C), temperature source Oral, resp. rate 20, height 5\' 4"  (1.626 m), weight 188 lb 1.6 oz (85.322 kg). Body mass index is 32.27 kg/(m^2). ECOG PERFORMANCE STATUS: 0 - Asymptomatic Well-developed well-nourished very pleasant female in no acute distress HEENT exam EOMI PERRLA sclerae anicteric no conjunctival pallor oral mucosa is moist neck is supple lungs are clear cardiovascular regular rate rhythm abdomen soft nontender no HSM extremities no edema neuro is nonfocal Bilateral expanders in place mild tenderness     LABORATORY DATA: Lab Results  Component Value Date   WBC 5.4 09/17/2012   HGB 10.4* 09/17/2012   HCT 30.9* 09/17/2012   MCV 90.5 09/17/2012   PLT 335 09/17/2012      Chemistry      Component Value Date/Time   NA 144 09/17/2012 0829   NA 139 05/17/2012 0530   K 4.1 09/17/2012 0829   K 3.7 05/17/2012 0530   CL 104  07/30/2012 0955   CL 100 05/17/2012 0530   CO2 28 09/17/2012 0829   CO2 33* 05/17/2012 0530   BUN 6.4* 09/17/2012 0829   BUN 4* 05/17/2012 0530   CREATININE 0.7 09/17/2012 0829   CREATININE 0.56 05/17/2012 0530      Component Value Date/Time   CALCIUM 9.9 09/17/2012 0829   CALCIUM 8.7 05/17/2012 0530   ALKPHOS 91 09/17/2012 0829   ALKPHOS 103 05/10/2012 1440   AST 20 09/17/2012 0829   AST 25 05/10/2012 1440   ALT 18 09/17/2012 0829   ALT 17 05/10/2012 1440   BILITOT 0.33 09/17/2012 0829   BILITOT 0.4 05/10/2012 1440    ADDITIONAL INFORMATION: 6. PROGNOSTIC INDICATORS - ACIS Results: IMMUNOHISTOCHEMICAL AND MORPHOMETRIC ANALYSIS BY THE AUTOMATED CELLULAR IMAGING SYSTEM (ACIS) Estrogen Receptor: 0%, NEGATIVE Progesterone Receptor: 0%, NEGATIVE Proliferation Marker Ki67: 92% COMMENT: The negative hormone receptor study(ies) in this case have an internal positive control. REFERENCE RANGE ESTROGEN RECEPTOR NEGATIVE <1% POSITIVE =>1% PROGESTERONE RECEPTOR NEGATIVE <1% POSITIVE =>1% All controls stained appropriately Pecola Leisure MD Pathologist, Electronic Signature ( Signed 05/21/2012) 6. CHROMOGENIC IN-SITU HYBRIDIZATION Results: HER-2/NEU BY CISH - NO AMPLIFICATION OF HER-2 DETECTED. 1 of 6 Duplicate copy FINAL for Kreitzer, [name] G [phone]) ADDITIONAL INFORMATION:(continued) RESULT RATIO OF HER2: CEP 17 SIGNALS 1.19 AVERAGE HER2 COPY NUMBER PER CELL 1.85 REFERENCE RANGE NEGATIVE HER2/Chr17 Ratio <2.0 and Average HER2 copy number <4.0 EQUIVOCAL HER2/Chr17 Ratio <2.0 and Average HER2 copy number 4.0 and <6.0 POSITIVE HER2/Chr17 Ratio >=2.0 and/or Average HER2 copy number >=6.0 Pecola Leisure MD Pathologist, Electronic Signature ( Signed 05/20/2012) FINAL DIAGNOSIS Diagnosis 1. Lymph node, sentinel, biopsy, Left axillary - THERE IS NO EVIDENCE OF CARCINOMA IN 1 OF 1 LYMPH NODE (0/1). 2. Lymph node, sentinel, biopsy, Left axillary - THERE IS NO EVIDENCE OF CARCINOMA IN 1 OF 1 LYMPH  NODE (0/1). 3. Lymph node, sentinel, biopsy, Left axillary - THERE IS NO EVIDENCE OF CARCINOMA IN 1 OF 1 LYMPH NODE (0/1). 4. Lymph node, sentinel, biopsy, Left axillary - THERE IS NO EVIDENCE OF CARCINOMA IN 1 OF 1 LYMPH NODE (0/1). 5. Lymph node, sentinel, biopsy, Left axillary - THERE IS NO EVIDENCE OF CARCINOMA IN 1 OF 1 LYMPH NODE (0/1). 6. Breast, simple mastectomy, Right - INVASIVE DUCTAL CARCINOMA, GRADE III/III, SPANNING 1.2 CM. - DUCTAL CARCINOMA IN SITU, HIGH GRADE. - LYMPHOVASCULAR INVASION IS IDENTIFIED. -  THERE IS NO EVIDENCE OF CARCINOMA IN 1 OF 1 LYMPH NODE (0/1). - SEE ONCOLOGY TABLE BELOW. 7. Breast, simple mastectomy, Left - INVASIVE DUCTAL CARCINOMA, AT LEAST THREE FOCI, GRADE II/III, SPANNING 1.4 CM, 1.2 CM, AND 0.1 CM. - DUCTAL CARCINOMA IN SITU, INTERMEDIATE GRADE. - LYMPHOVASCULAR INVASION IS IDENTIFIED. - DUCTAL CARCINOMA IN SITU IS FOCALLY 0.2 CM TO THE ANTERIOR/INFERIOR SOFT TISSUE RESECTION MARGIN. - LOBULAR NEOPLASIA (LOBULAR CARCINOMA IN SITU). - THERE IS NO EVIDENCE OF CARCINOMA IN 2 OF 2 LYMPH NODES (0/2). - SEE ONCOLOGY TABLE BELOW. 8. Lymph node, biopsy, Left axillary - THERE IS NO EVIDENCE OF CARCINOMA IN 1 OF 1 LYMPH NODE (0/1). Microscopic Comment 6. BREAST, INVASIVE TUMOR, WITH LYMPH NODE SAMPLING Specimen, including laterality: Right breast Procedure: Simple mastectomy Grade: 3 2 of 6 Duplicate copy FINAL for [name] [name], [name] G ([id number]) Microscopic Comment(continued) Tubule formation: 3 Nuclear pleomorphism: 3 Mitotic:2 Tumor size (gross measurement): 1.2 cm Margins: Invasive, distance to closest margin: 1.3 cm to the deep margin (gross measurement) In-situ, distance to closest margin: 1.3 cm to the deep margin (gross measurement) Lymphovascular invasion: Present Ductal carcinoma in situ: Present Grade: High grade Extensive intraductal component: Not identified Lobular neoplasia: Not identified in specimen #6 Tumor focality:  Unifocal Treatment effect: N/A Extent of tumor: Confined to breast parenchyma Lymph nodes: # examined: 1 Lymph nodes with metastasis: 0 Breast prognostic profile: Will be performed on the current case and the results reported separately. TNM: pT1c, pN0 7. BREAST, INVASIVE TUMOR, WITH LYMPH NODE SAMPLING Specimen, including laterality: Left breast Procedure: Simple mastectomy Grade: 2 Tubule formation: 3 Nuclear pleomorphism: 2 Mitotic:2 Tumor size (gross measurements) 1.4 cm, 1.2 cm, and 0.1 cm Margins: Invasive, distance to closest margin: 0.7 cm to the anterior/inferior soft tissue resection margin In-situ, distance to closest margin: Focally 0.2 cm to the anterior/inferior soft tissue resection margin (glass slide measurement) Lymphovascular invasion: Present Ductal carcinoma in situ: Present Grade: Intermediate grade Extensive intraductal component: Yes Lobular neoplasia: Yes (lobular carcinoma in situ) Tumor focality: Multiple foci Treatment effect: N/A Extent of tumor: Confined to breast parenchyma Lymph nodes: # examined: 8 Lymph nodes with metastasis: 0 Breast prognostic profile: [phone] Estrogen receptor: 100% strong staining intensity Progesterone receptor: 11% strong staining intensity Her 2 neu: Amplification was detected. The ratio was 6.50. 3 of 6 Duplicate copy FINAL for [name] [name], [name] G ([id number]) Microscopic Comment(continued) Ki-67: 80% TNM: mpT1c, pN0 Comments: In addition to the two grossly identified nodules, there is invasive carcinoma present in random tissue submitted from the inferior lateral quadrant. The three foci of carcinoma are morphologically similar.   RADIOGRAPHIC STUDIES:  Nm Pet Image Initial (pi) Skull Base To Thigh  05/12/2012  *RADIOLOGY REPORT*  Clinical Data: Initial treatment strategy for breast cancer.   NUCLEAR MEDICINE PET SKULL BASE TO THIGH  Fasting Blood Glucose:  143  Technique:  20 mCi F-18 FDG was injected  intravenously. CT data was obtained and used for attenuation correction and anatomic localization only.  (This was not acquired as a diagnostic CT examination.) Additional exam technical data entered on technologist worksheet.  Comparison:  none  Findings:  Neck: No hypermetabolic lymph nodes in the neck.  Chest:  In the inferior medial aspect of the right breast there is a 10 mm nodule (image 99) with moderate  metabolic activity ( SUV max = 2.9).  No hypermetabolic mediastinal or hilar lymph nodes.  There is hypermetabolic activity in the thoracic inlet which is felt to be vascular in nature.  There  is activity adjacent to the thoracic aorta which is also felt to be vascular may relate to the hemiazygos vein.  Abdomen/Pelvis:  No abnormal hypermetabolic activity within the liver, pancreas, adrenal glands, or spleen.  No hypermetabolic lymph nodes in the abdomen or pelvis.  Skeleton:  No focal hypermetabolic activity to suggest skeletal metastasis.  IMPRESSION:  1.  No evidence of distant breast cancer metastasis. 2.  Hypermetabolic nodule within the inferior medial right breast   Original Report Authenticated By: Genevive Bi, M.D.    Nm Sentinel Node Inj-no Rpt (breast)  05/14/2012  CLINICAL DATA: cancer left breast   Sulfur colloid was injected intradermally by the nuclear medicine  technologist for breast cancer sentinel node localization.     Dg Chest Port 1 View  05/14/2012  *RADIOLOGY REPORT*  Clinical Data: Status post Port-A-Cath placement  PORTABLE CHEST - 1 VIEW  Comparison: None.  Findings: The cardiac shadow is within normal limits.  Bilateral tissue expanders as well as surgical drains are identified.  A right chest wall port is seen. The catheter tip is at the cavoatrial junction.  No pneumothorax is noted.  The lungs are clear bilaterally.  IMPRESSION: No evidence of pneumothorax following port placement.  Bilateral tissue expanders.   Original Report Authenticated By: Alcide Clever, M.D.     Dg Fluoro Guide Cv Line-no Report  05/14/2012  CLINICAL DATA: LEFT BREAST CANCER/PORT-A-CATH   FLOURO GUIDE CV LINE  Fluoroscopy was utilized by the requesting physician.  No radiographic  interpretation.      ASSESSMENT: 62 year old female with  #1 bilateral breast cancers on the right side she has triple-negative disease stage I left side stage I ER positive PR positive HER-2/neu positive disease. Patient is status post bilateral mastectomies. Of note on the right side the lymph node removed was not a sentinel lymph node. It is unclear what to do with this side in terms of whether or not to do further sentinel lymph node. Clinically patient seems to be doing well.  #2 we discussed her pathology in detail. We discussed chemotherapy to be given adjuvantly. We discussed Adriamycin Cytoxan given every 2 weeks for a total of 4 cycles followed by Taxol and Herceptin given weekly x12 weeks followed by Herceptin every 3 weeks to finish out a total of one year of therapy. We also discussed role of adjuvant hormonal therapy since the left side is ER positive.  #3 patient is now here (5/16) to begin cycle 1 day 1 of Adriamycin and Cytoxan to be given every 2 weeks for a total of 4 cycles. Patient is now status post 4 cycles of Adriamycin Cytoxan given from 07/02/2012 through 08/12/2012. Overall she tolerated the treatment well.  #4 patient will begin Taxol and Herceptin every week for a total of 12 weeks beginning 08/27/2012. Patient understands risks benefits and side effects. Status post one cycle on 08/27/2012 and then discontinued due to neuropathy.  #5 patient will switch to Abraxane and Herceptin 3 weeks on one week off to complete out 12 weeks of therapy.she began Abraxane and Herceptin starting on 09/03/2012. A total of 4 cycles is planned   PLAN:  #1 patient will proceed with day 15 cycle 1 of Abraxane and Herceptin today.She is tolerating this well.  #2 she will have next week off from any  kind of treatment.  #3 patient had an echocardiogram with Dr. Garnette Scheuermann  #4 patient will return on 10/01/2013 for cycle 2 day 1 of Abraxane and Herceptin  All  questions were answered. The patient knows to call the clinic with any problems, questions or concerns. We can certainly see the patient much sooner if necessary.  I spent 25 minutes counseling the patient face to face. The total time spent in the appointment was 30 minutes.    Drue Second, MD Medical/Oncology [facility] [phone] (beeper) [phone] (Office)

## 2012-10-01 ENCOUNTER — Ambulatory Visit (HOSPITAL_BASED_OUTPATIENT_CLINIC_OR_DEPARTMENT_OTHER): Payer: BC Managed Care – PPO

## 2012-10-01 ENCOUNTER — Ambulatory Visit (HOSPITAL_BASED_OUTPATIENT_CLINIC_OR_DEPARTMENT_OTHER): Payer: BC Managed Care – PPO | Admitting: Oncology

## 2012-10-01 ENCOUNTER — Other Ambulatory Visit (HOSPITAL_BASED_OUTPATIENT_CLINIC_OR_DEPARTMENT_OTHER): Payer: BC Managed Care – PPO | Admitting: Lab

## 2012-10-01 VITALS — BP 147/81 | HR 84 | Temp 98.7°F | Resp 20 | Ht 64.0 in | Wt 191.3 lb

## 2012-10-01 DIAGNOSIS — C50119 Malignant neoplasm of central portion of unspecified female breast: Secondary | ICD-10-CM

## 2012-10-01 DIAGNOSIS — Z5111 Encounter for antineoplastic chemotherapy: Secondary | ICD-10-CM

## 2012-10-01 DIAGNOSIS — C50911 Malignant neoplasm of unspecified site of right female breast: Secondary | ICD-10-CM

## 2012-10-01 DIAGNOSIS — C50912 Malignant neoplasm of unspecified site of left female breast: Secondary | ICD-10-CM

## 2012-10-01 DIAGNOSIS — C50919 Malignant neoplasm of unspecified site of unspecified female breast: Secondary | ICD-10-CM

## 2012-10-01 DIAGNOSIS — R5381 Other malaise: Secondary | ICD-10-CM

## 2012-10-01 DIAGNOSIS — G589 Mononeuropathy, unspecified: Secondary | ICD-10-CM

## 2012-10-01 DIAGNOSIS — Z5112 Encounter for antineoplastic immunotherapy: Secondary | ICD-10-CM

## 2012-10-01 LAB — COMPREHENSIVE METABOLIC PANEL (CC13)
ALT: 13 U/L (ref 0–55)
AST: 17 U/L (ref 5–34)
Albumin: 3.3 g/dL — ABNORMAL LOW (ref 3.5–5.0)
Alkaline Phosphatase: 90 U/L (ref 40–150)
BUN: 5.9 mg/dL — ABNORMAL LOW (ref 7.0–26.0)
CO2: 28 mEq/L (ref 22–29)
Calcium: 9.7 mg/dL (ref 8.4–10.4)
Chloride: 106 mEq/L (ref 98–109)
Creatinine: 0.6 mg/dL (ref 0.6–1.1)
Glucose: 133 mg/dl (ref 70–140)
Potassium: 3.5 mEq/L (ref 3.5–5.1)
Sodium: 146 mEq/L — ABNORMAL HIGH (ref 136–145)
Total Bilirubin: 0.45 mg/dL (ref 0.20–1.20)
Total Protein: 6.5 g/dL (ref 6.4–8.3)

## 2012-10-01 LAB — CBC WITH DIFFERENTIAL/PLATELET
BASO%: 0.6 % (ref 0.0–2.0)
Basophils Absolute: 0 10*3/uL (ref 0.0–0.1)
EOS%: 1.9 % (ref 0.0–7.0)
Eosinophils Absolute: 0.1 10*3/uL (ref 0.0–0.5)
HCT: 31.7 % — ABNORMAL LOW (ref 34.8–46.6)
HGB: 10.1 g/dL — ABNORMAL LOW (ref 11.6–15.9)
LYMPH%: 11.7 % — ABNORMAL LOW (ref 14.0–49.7)
MCH: 28.9 pg (ref 25.1–34.0)
MCHC: 31.9 g/dL (ref 31.5–36.0)
MCV: 90.6 fL (ref 79.5–101.0)
MONO#: 0.7 10*3/uL (ref 0.1–0.9)
MONO%: 13 % (ref 0.0–14.0)
NEUT#: 3.9 10*3/uL (ref 1.5–6.5)
NEUT%: 72.8 % (ref 38.4–76.8)
Platelets: 255 10*3/uL (ref 145–400)
RBC: 3.5 10*6/uL — ABNORMAL LOW (ref 3.70–5.45)
RDW: 15.7 % — ABNORMAL HIGH (ref 11.2–14.5)
WBC: 5.3 10*3/uL (ref 3.9–10.3)
lymph#: 0.6 10*3/uL — ABNORMAL LOW (ref 0.9–3.3)

## 2012-10-01 MED ORDER — SODIUM CHLORIDE 0.9 % IV SOLN
Freq: Once | INTRAVENOUS | Status: AC
  Administered 2012-10-01: 11:00:00 via INTRAVENOUS

## 2012-10-01 MED ORDER — ONDANSETRON 8 MG/50ML IVPB (CHCC)
8.0000 mg | Freq: Once | INTRAVENOUS | Status: AC
  Administered 2012-10-01: 8 mg via INTRAVENOUS

## 2012-10-01 MED ORDER — PACLITAXEL PROTEIN-BOUND CHEMO INJECTION 100 MG
100.0000 mg/m2 | Freq: Once | INTRAVENOUS | Status: AC
  Administered 2012-10-01: 200 mg via INTRAVENOUS
  Filled 2012-10-01: qty 40

## 2012-10-01 MED ORDER — DEXAMETHASONE SODIUM PHOSPHATE 10 MG/ML IJ SOLN
10.0000 mg | Freq: Once | INTRAMUSCULAR | Status: AC
  Administered 2012-10-01: 10 mg via INTRAVENOUS

## 2012-10-01 MED ORDER — SODIUM CHLORIDE 0.9 % IJ SOLN
10.0000 mL | INTRAMUSCULAR | Status: DC | PRN
Start: 2012-10-01 — End: 2012-10-01
  Administered 2012-10-01: 10 mL
  Filled 2012-10-01: qty 10

## 2012-10-01 MED ORDER — DIPHENHYDRAMINE HCL 25 MG PO CAPS
50.0000 mg | ORAL_CAPSULE | Freq: Once | ORAL | Status: AC
  Administered 2012-10-01: 50 mg via ORAL

## 2012-10-01 MED ORDER — ACETAMINOPHEN 325 MG PO TABS
650.0000 mg | ORAL_TABLET | Freq: Once | ORAL | Status: AC
  Administered 2012-10-01: 650 mg via ORAL

## 2012-10-01 MED ORDER — TRASTUZUMAB CHEMO INJECTION 440 MG
2.0000 mg/kg | Freq: Once | INTRAVENOUS | Status: AC
  Administered 2012-10-01: 168 mg via INTRAVENOUS
  Filled 2012-10-01: qty 8

## 2012-10-01 MED ORDER — HEPARIN SOD (PORK) LOCK FLUSH 100 UNIT/ML IV SOLN
500.0000 [IU] | Freq: Once | INTRAVENOUS | Status: AC | PRN
  Administered 2012-10-01: 500 [IU]
  Filled 2012-10-01: qty 5

## 2012-10-01 NOTE — Progress Notes (Signed)
OFFICE PROGRESS NOTE  CC  [name] Aliment, MD [address] [place] [address] Dr. 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 Patient is status post bilateral mastectomy with immediate reconstruction  STAGE:  Right breast (mastectomy)  T1cN0 1.2 cm ER-/PR-/her-/ki-67 92% 0/1 nodes positive  Left breast (mastectomy with snl) T1cN0 1.4 cm, 1.2 cm, 0.1c ER 100%, PR 11%, her2neu positive 6.50, Ki-67 80%   PRIOR THERAPY:  #1screening mammogram performed that showed 2 areas of concern on the left side.calcifications were noted in the left. In the right breast a possible mass warranted further evaluation.   #2 On 03/29/2012 patient underwent a bilateral diagnostic mammogram and right breast ultrasound. A spot magnification images demonstrate suspicious group of pleomorphic microcalcifications over the outer lower left breast with additional suspicious group of pleomorphic microcalcifications over the outer midportion of the left periareolar region. Spot compression images of the right breast demonstrate persistence of a 1 cm density at the edge of the film in the deep third of the right inner breast.   #3Ultrasound performed showed no focal abnormality over the entire in her right breast to correspond to the mammographic density. Patient was recommended stereotactic core needle biopsy of the 2 groups of suspicious left breast microcalcifications. Because patient and her husband wanted bilateral mastectomies MRI was not performed.on 04/15/2012 patient had needle core biopsy performed of the 2 areas in the left breast. The left needle core biopsy in the lower breast revealed ductal carcinoma in situ with associated comedo necrosis and calcifications with the in situ carcinoma. It was ER +100% PR +12%. 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 Tumor was ER +100% PR +11% proliferation marker Ki-67 elevated at 80% HER-2/neu showed amplification with a ratio of 6.50.   #4 patient is now status post bilateral mastectomies performed on 05/14/2012 with immediate reconstruction. The final pathology revealed bilateral breast cancers. In the right breast tissue is noted to have a 1.2 cm invasive ductal carcinoma high-grade ER negative PR negative HER-2/neu negative with Ki-67 92% one node was negative for metastatic disease but this was not the sentinel lymph node. On the left side the patient had multifocal disease measuring 1.4 cm, 1.2 cm, 0.1 cm. Tumor was ER +100% PR +11% HER-2/neu positive with a ratio 6.50. Ki-67 was 80% 8 sentinel nodes were negative for metastatic disease.  #5 patient has had immediate reconstruction with expanders bilaterally.  #6s/p adjuvant chemotherapy initially consisting of Adriamycin Cytoxan every 2 weeks for a total of 4 cycles. Given 07/02/12 - 08/12/12  #7This will then be followed by Taxol and Herceptin to be given weekly for 12 weeks time.beginning 08/27/12. This was discontinued due to neuropathy.  #8 Abraxane and Herceptin days 1, day 8, day 15 on a 28 day cycle. Beginning 09/03/2012   CURRENT THERAPY: Abraxane and Herceptin cycle 2 day 1  INTERVAL HISTORY: [name] [name] 62 y.o. female returns for followup visit. overall patient is doing well. She is a little weak tired and fatigued. 1 a full one week off. She has no nausea or vomiting no fevers chills or night sweats. She has been eating well. No headaches no diarrhea or constipation no changes in her bladder habits. Remainder of the 10 point review of systems is negative.  MEDICAL HISTORY: Past Medical History  Diagnosis Date  . Heart murmur   . Diabetes mellitus without complication   . Osteopenia   . Cancer     breast  . Bronchitis   .  Pneumonia   . Hiatal hernia   . Sinus problem   . Diabetes mellitus   . Breast cancer 04/15/12    left-biopsy  . Breast cancer, right breast 05/14/12    right mastectomy    ALLERGIES:  is allergic to darvon and penicillins.  MEDICATIONS:  Current Outpatient Prescriptions  Medication Sig Dispense Refill  . Ascorbic Acid (VITAMIN C) 500 MG tablet Take 500 mg by mouth daily.        . Biotin 10 MG TABS Take by mouth. 4000 mcg daily      . calcitonin, salmon, (MIACALCIN/FORTICAL) 200 UNIT/ACT nasal spray Place 1 spray into the nose daily.      . Calcium Citrate (CITRACAL PO) Take by mouth.      . Cholecalciferol (VITAMIN D PO) Take 1 tablet by mouth daily.      Marland Kitchen docusate sodium 100 MG CAPS Take 100 mg by mouth 2 (two) times daily.  30 capsule  1  . fluticasone (FLONASE) 50 MCG/ACT nasal spray Place 2 sprays into the nose daily.  16 g  2  . KRILL OIL PO Take by mouth.      . lidocaine-prilocaine (EMLA) cream Apply topically as needed.  30 g  1  . Multiple Vitamin (MULTIVITAMIN PO) Take 1 tablet by mouth daily.       . pantoprazole (PROTONIX) 40 MG tablet Take 40 mg by mouth daily.        . Probiotic Product (PROBIOTIC DAILY PO) Take by mouth daily.      . rosuvastatin (CRESTOR) 5 MG tablet Take 5 mg by mouth every Monday, Wednesday, and Friday.      . sitaGLIPtin (JANUVIA) 100 MG tablet Take 100 mg by mouth daily.      Marland Kitchen UNABLE TO FIND Apply 1 Device topically once. Cranial prosthesis r/t chemotherapy treatment  1 Device  0   No current facility-administered medications for this visit.    SURGICAL HISTORY:  Past Surgical History  Procedure Laterality Date  . Cesarean section  1981, 1983  . Tummy tuck  1999  . Tonsillectomy    . Foot surgery    . Gastric bypass    . Wound debridement  01/14/2011    Procedure: DEBRIDEMENT ABDOMINAL WOUND;  Surgeon: Ernestene Mention, MD;  Location: [facility];  Service: General;  Laterality: N/A;  wound debridement and debridement on  the abdomen  . Cosmetic surgery    . Total mastectomy Bilateral 05/14/2012    Procedure: TOTAL MASTECTOMY;  Surgeon: Ernestene Mention, MD;  Location: [facility] OR;  Service: General;  Laterality: Bilateral;  . Axillary sentinel node biopsy Left 05/14/2012    Procedure: AXILLARY SENTINEL NODE BIOPSY;  Surgeon: Ernestene Mention, MD;  Location: [facility] OR;  Service: General;  Laterality: Left;  . Portacath placement N/A 05/14/2012    Procedure: INSERTION PORT-A-CATH;  Surgeon: Ernestene Mention, MD;  Location: [facility] OR;  Service: General;  Laterality: N/A;  . Breast reconstruction with placement of tissue expander and flex hd (acellular hydrated dermis) Bilateral 05/14/2012    Procedure: BREAST RECONSTRUCTION WITH PLACEMENT OF TISSUE EXPANDER AND FLEX HD (ACELLULAR HYDRATED DERMIS);  Surgeon: Etter Sjogren, MD;  Location[facility] OR;  Service: Plastics;  Laterality: Bilateral;  . Bunionectomy  REVIEW OF SYSTEMS:  Pertinent items are noted in HPI.   HEALTH MAINTENANCE:   PHYSICAL EXAMINATION: Blood pressure 147/81, pulse 84, temperature 98.7 F (37.1 C), temperature source Oral, resp. rate 20, height 5\' 4"  (1.626 m), weight 191 lb 4.8 oz (86.773 kg). Body mass index is 32.82 kg/(m^2). ECOG PERFORMANCE STATUS: 0 - Asymptomatic Well-developed well-nourished very pleasant female in no acute distress HEENT exam EOMI PERRLA sclerae anicteric no conjunctival pallor oral mucosa is moist neck is supple lungs are clear cardiovascular regular rate rhythm abdomen soft nontender no HSM extremities no edema neuro is nonfocal Bilateral expanders in place mild tenderness     LABORATORY DATA: Lab Results  Component Value Date   WBC 5.3 10/01/2012   HGB 10.1* 10/01/2012   HCT 31.7* 10/01/2012   MCV 90.6 10/01/2012   PLT 255 10/01/2012      Chemistry      Component Value Date/Time   NA 146* 10/01/2012 0831   NA 139 05/17/2012 0530   K 3.5 10/01/2012 0831   K 3.7 05/17/2012 0530   CL 104 07/30/2012 0955   CL 100 05/17/2012  0530   CO2 28 10/01/2012 0831   CO2 33* 05/17/2012 0530   BUN 5.9* 10/01/2012 0831   BUN 4* 05/17/2012 0530   CREATININE 0.6 10/01/2012 0831   CREATININE 0.56 05/17/2012 0530      Component Value Date/Time   CALCIUM 9.7 10/01/2012 0831   CALCIUM 8.7 05/17/2012 0530   ALKPHOS 90 10/01/2012 0831   ALKPHOS 103 05/10/2012 1440   AST 17 10/01/2012 0831   AST 25 05/10/2012 1440   ALT 13 10/01/2012 0831   ALT 17 05/10/2012 1440   BILITOT 0.45 10/01/2012 0831   BILITOT 0.4 05/10/2012 1440    ADDITIONAL INFORMATION: 6. PROGNOSTIC INDICATORS - ACIS Results: IMMUNOHISTOCHEMICAL AND MORPHOMETRIC ANALYSIS BY THE AUTOMATED CELLULAR IMAGING SYSTEM (ACIS) Estrogen Receptor: 0%, NEGATIVE Progesterone Receptor: 0%, NEGATIVE Proliferation Marker Ki67: 92% COMMENT: The negative hormone receptor study(ies) in this case have an internal positive control. REFERENCE RANGE ESTROGEN RECEPTOR NEGATIVE <1% POSITIVE =>1% PROGESTERONE RECEPTOR NEGATIVE <1% POSITIVE =>1% All controls stained appropriately Pecola Leisure MD Pathologist, Electronic Signature ( Signed 05/21/2012) 6. CHROMOGENIC IN-SITU HYBRIDIZATION Results: HER-2/NEU BY CISH - NO AMPLIFICATION OF HER-2 DETECTED. 1 of 6 Duplicate copy FINAL for Bynum, [name] G [id number]) ADDITIONAL INFORMATION:(continued) RESULT RATIO OF HER2: CEP 17 SIGNALS 1.19 AVERAGE HER2 COPY NUMBER PER CELL 1.85 REFERENCE RANGE NEGATIVE HER2/Chr17 Ratio <2.0 and Average HER2 copy number <4.0 EQUIVOCAL HER2/Chr17 Ratio <2.0 and Average HER2 copy number 4.0 and <6.0 POSITIVE HER2/Chr17 Ratio >=2.0 and/or Average HER2 copy number >=6.0 Pecola Leisure MD Pathologist, Electronic Signature ( Signed 05/20/2012) FINAL DIAGNOSIS Diagnosis 1. Lymph node, sentinel, biopsy, Left axillary - THERE IS NO EVIDENCE OF CARCINOMA IN 1 OF 1 LYMPH NODE (0/1). 2. Lymph node, sentinel, biopsy, Left axillary - THERE IS NO EVIDENCE OF CARCINOMA IN 1 OF 1 LYMPH NODE (0/1). 3. Lymph node,  sentinel, biopsy, Left axillary - THERE IS NO EVIDENCE OF CARCINOMA IN 1 OF 1 LYMPH NODE (0/1). 4. Lymph node, sentinel, biopsy, Left axillary - THERE IS NO EVIDENCE OF CARCINOMA IN 1 OF 1 LYMPH NODE (0/1). 5. Lymph node, sentinel, biopsy, Left axillary - THERE IS NO EVIDENCE OF CARCINOMA IN 1 OF 1 LYMPH NODE (0/1). 6. Breast, simple mastectomy, Right - INVASIVE DUCTAL CARCINOMA, GRADE III/III, SPANNING 1.2 CM. - DUCTAL CARCINOMA IN SITU, HIGH GRADE. - LYMPHOVASCULAR INVASION IS IDENTIFIED. - THERE IS NO EVIDENCE OF  CARCINOMA IN 1 OF 1 LYMPH NODE (0/1). - SEE ONCOLOGY TABLE BELOW. 7. Breast, simple mastectomy, Left - INVASIVE DUCTAL CARCINOMA, AT LEAST THREE FOCI, GRADE II/III, SPANNING 1.4 CM, 1.2 CM, AND 0.1 CM. - DUCTAL CARCINOMA IN SITU, INTERMEDIATE GRADE. - LYMPHOVASCULAR INVASION IS IDENTIFIED. - DUCTAL CARCINOMA IN SITU IS FOCALLY 0.2 CM TO THE ANTERIOR/INFERIOR SOFT TISSUE RESECTION MARGIN. - LOBULAR NEOPLASIA (LOBULAR CARCINOMA IN SITU). - THERE IS NO EVIDENCE OF CARCINOMA IN 2 OF 2 LYMPH NODES (0/2). - SEE ONCOLOGY TABLE BELOW. 8. Lymph node, biopsy, Left axillary - THERE IS NO EVIDENCE OF CARCINOMA IN 1 OF 1 LYMPH NODE (0/1). Microscopic Comment 6. BREAST, INVASIVE TUMOR, WITH LYMPH NODE SAMPLING Specimen, including laterality: Right breast Procedure: Simple mastectomy Grade: 3 2 of 6 Duplicate copy FINAL for Howlett, [name] G ([id number]) Microscopic Comment(continued) Tubule formation: 3 Nuclear pleomorphism: 3 Mitotic:2 Tumor size (gross measurement): 1.2 cm Margins: Invasive, distance to closest margin: 1.3 cm to the deep margin (gross measurement) In-situ, distance to closest margin: 1.3 cm to the deep margin (gross measurement) Lymphovascular invasion: Present Ductal carcinoma in situ: Present Grade: High grade Extensive intraductal component: Not identified Lobular neoplasia: Not identified in specimen #6 Tumor focality: Unifocal Treatment effect:  N/A Extent of tumor: Confined to breast parenchyma Lymph nodes: # examined: 1 Lymph nodes with metastasis: 0 Breast prognostic profile: Will be performed on the current case and the results reported separately. TNM: pT1c, pN0 7. BREAST, INVASIVE TUMOR, WITH LYMPH NODE SAMPLING Specimen, including laterality: Left breast Procedure: Simple mastectomy Grade: 2 Tubule formation: 3 Nuclear pleomorphism: 2 Mitotic:2 Tumor size (gross measurements) 1.4 cm, 1.2 cm, and 0.1 cm Margins: Invasive, distance to closest margin: 0.7 cm to the anterior/inferior soft tissue resection margin In-situ, distance to closest margin: Focally 0.2 cm to the anterior/inferior soft tissue resection margin (glass slide measurement) Lymphovascular invasion: Present Ductal carcinoma in situ: Present Grade: Intermediate grade Extensive intraductal component: Yes Lobular neoplasia: Yes (lobular carcinoma in situ) Tumor focality: Multiple foci Treatment effect: N/A Extent of tumor: Confined to breast parenchyma Lymph nodes: # examined: 8 Lymph nodes with metastasis: 0 Breast prognostic profile: [phone] Estrogen receptor: 100% strong staining intensity Progesterone receptor: 11% strong staining intensity Her 2 neu: Amplification was detected. The ratio was 6.50. 3 of 6 Duplicate copy FINAL for Waterhouse, [name] G ([id number]) Microscopic Comment(continued) Ki-67: 80% TNM: mpT1c, pN0 Comments: In addition to the two grossly identified nodules, there is invasive carcinoma present in random tissue submitted from the inferior lateral quadrant. The three foci of carcinoma are morphologically similar.   RADIOGRAPHIC STUDIES:  Nm Pet Image Initial (pi) Skull Base To Thigh  05/12/2012  *RADIOLOGY REPORT*  Clinical Data: Initial treatment strategy for breast cancer.   NUCLEAR MEDICINE PET SKULL BASE TO THIGH  Fasting Blood Glucose:  143  Technique:  20 mCi F-18 FDG was injected intravenously. CT data was  obtained and used for attenuation correction and anatomic localization only.  (This was not acquired as a diagnostic CT examination.) Additional exam technical data entered on technologist worksheet.  Comparison:  none  Findings:  Neck: No hypermetabolic lymph nodes in the neck.  Chest:  In the inferior medial aspect of the right breast there is a 10 mm nodule (image 99) with moderate  metabolic activity ( SUV max = 2.9).  No hypermetabolic mediastinal or hilar lymph nodes.  There is hypermetabolic activity in the thoracic inlet which is felt to be vascular in nature.  There is activity adjacent to the  thoracic aorta which is also felt to be vascular may relate to the hemiazygos vein.  Abdomen/Pelvis:  No abnormal hypermetabolic activity within the liver, pancreas, adrenal glands, or spleen.  No hypermetabolic lymph nodes in the abdomen or pelvis.  Skeleton:  No focal hypermetabolic activity to suggest skeletal metastasis.  IMPRESSION:  1.  No evidence of distant breast cancer metastasis. 2.  Hypermetabolic nodule within the inferior medial right breast   Original Report Authenticated By: Genevive Bi, M.D.    Nm Sentinel Node Inj-no Rpt (breast)  05/14/2012  CLINICAL DATA: cancer left breast   Sulfur colloid was injected intradermally by the nuclear medicine  technologist for breast cancer sentinel node localization.     Dg Chest Port 1 View  05/14/2012  *RADIOLOGY REPORT*  Clinical Data: Status post Port-A-Cath placement  PORTABLE CHEST - 1 VIEW  Comparison: None.  Findings: The cardiac shadow is within normal limits.  Bilateral tissue expanders as well as surgical drains are identified.  A right chest wall port is seen. The catheter tip is at the cavoatrial junction.  No pneumothorax is noted.  The lungs are clear bilaterally.  IMPRESSION: No evidence of pneumothorax following port placement.  Bilateral tissue expanders.   Original Report Authenticated By: Alcide Clever, M.D.    Dg Fluoro Guide Cv  Line-no Report  05/14/2012  CLINICAL DATA: LEFT BREAST CANCER/PORT-A-CATH   FLOURO GUIDE CV LINE  Fluoroscopy was utilized by the requesting physician.  No radiographic  interpretation.      ASSESSMENT: 62 year old female with  #1 bilateral breast cancers on the right side she has triple-negative disease stage I left side stage I ER positive PR positive HER-2/neu positive disease. Patient is status post bilateral mastectomies. Of note on the right side the lymph node removed was not a sentinel lymph node. It is unclear what to do with this side in terms of whether or not to do further sentinel lymph node. Clinically patient seems to be doing well.  #2 we discussed her pathology in detail. We discussed chemotherapy to be given adjuvantly. We discussed Adriamycin Cytoxan given every 2 weeks for a total of 4 cycles followed by Taxol and Herceptin given weekly x12 weeks followed by Herceptin every 3 weeks to finish out a total of one year of therapy. We also discussed role of adjuvant hormonal therapy since the left side is ER positive.  #3 patient is now here (5/16) to begin cycle 1 day 1 of Adriamycin and Cytoxan to be given every 2 weeks for a total of 4 cycles. Patient is now status post 4 cycles of Adriamycin Cytoxan given from 07/02/2012 through 08/12/2012. Overall she tolerated the treatment well.  #4 patient will begin Taxol and Herceptin every week for a total of 12 weeks beginning 08/27/2012. Patient understands risks benefits and side effects. Status post one cycle on 08/27/2012 and then discontinued due to neuropathy.  #5 patient will switch to Abraxane and Herceptin 3 weeks on one week off to complete out 12 weeks of therapy.she began Abraxane and Herceptin starting on 09/03/2012. A total of 4 cycles is planned   PLAN:  #1 patient will proceed with day 1 cycle 2 of Abraxane and Herceptin today.She is tolerating this well.  #2 echocardiogram shows an ejection fraction between 55 and  60%.  #3 patient's neuropathies are stable.  #4 she will return in one week's time for cycle 2 day 8 of Abraxane and Herceptin.  All questions were answered. The patient knows to call the  clinic with any problems, questions or concerns. We can certainly see the patient much sooner if necessary.  I spent 25 minutes counseling the patient face to face. The total time spent in the appointment was 30 minutes.    Drue Second, MD Medical/Oncology [facility] [phone] (beeper) [phone] (Office)

## 2012-10-01 NOTE — Patient Instructions (Addendum)
[facility] Discharge Instructions for Patients Receiving Chemotherapy  Today you received the following chemotherapy agents: Herceptin and Abraxane   To help prevent nausea and vomiting after your treatment, we encourage you to take your nausea medication as prescribed.    If you develop nausea and vomiting that is not controlled by your nausea medication, call the clinic.   BELOW ARE SYMPTOMS THAT SHOULD BE REPORTED IMMEDIATELY:  *FEVER GREATER THAN 100.5 F  *CHILLS WITH OR WITHOUT FEVER  NAUSEA AND VOMITING THAT IS NOT CONTROLLED WITH YOUR NAUSEA MEDICATION  *UNUSUAL SHORTNESS OF BREATH  *UNUSUAL BRUISING OR BLEEDING  TENDERNESS IN MOUTH AND THROAT WITH OR WITHOUT PRESENCE OF ULCERS  *URINARY PROBLEMS  *BOWEL PROBLEMS  UNUSUAL RASH Items with * indicate a potential emergency and should be followed up as soon as possible.  Feel free to call the clinic you have any questions or concerns. The clinic phone number is [phone].    

## 2012-10-08 ENCOUNTER — Other Ambulatory Visit (HOSPITAL_BASED_OUTPATIENT_CLINIC_OR_DEPARTMENT_OTHER): Payer: BC Managed Care – PPO | Admitting: Lab

## 2012-10-08 ENCOUNTER — Encounter: Payer: Self-pay | Admitting: Adult Health

## 2012-10-08 ENCOUNTER — Ambulatory Visit (HOSPITAL_BASED_OUTPATIENT_CLINIC_OR_DEPARTMENT_OTHER): Payer: BC Managed Care – PPO | Admitting: Adult Health

## 2012-10-08 ENCOUNTER — Ambulatory Visit (HOSPITAL_BASED_OUTPATIENT_CLINIC_OR_DEPARTMENT_OTHER): Payer: BC Managed Care – PPO

## 2012-10-08 VITALS — BP 140/80 | HR 89 | Temp 99.4°F | Resp 18 | Ht 64.0 in | Wt 187.3 lb

## 2012-10-08 VITALS — Temp 99.0°F

## 2012-10-08 DIAGNOSIS — C50119 Malignant neoplasm of central portion of unspecified female breast: Secondary | ICD-10-CM

## 2012-10-08 DIAGNOSIS — G589 Mononeuropathy, unspecified: Secondary | ICD-10-CM

## 2012-10-08 DIAGNOSIS — C50911 Malignant neoplasm of unspecified site of right female breast: Secondary | ICD-10-CM

## 2012-10-08 DIAGNOSIS — Z5111 Encounter for antineoplastic chemotherapy: Secondary | ICD-10-CM

## 2012-10-08 DIAGNOSIS — C50912 Malignant neoplasm of unspecified site of left female breast: Secondary | ICD-10-CM

## 2012-10-08 DIAGNOSIS — C50919 Malignant neoplasm of unspecified site of unspecified female breast: Secondary | ICD-10-CM

## 2012-10-08 DIAGNOSIS — Z170 Estrogen receptor positive status [ER+]: Secondary | ICD-10-CM

## 2012-10-08 DIAGNOSIS — Z5112 Encounter for antineoplastic immunotherapy: Secondary | ICD-10-CM

## 2012-10-08 LAB — COMPREHENSIVE METABOLIC PANEL (CC13)
ALT: 15 U/L (ref 0–55)
AST: 18 U/L (ref 5–34)
Albumin: 3.4 g/dL — ABNORMAL LOW (ref 3.5–5.0)
Alkaline Phosphatase: 87 U/L (ref 40–150)
BUN: 8.7 mg/dL (ref 7.0–26.0)
CO2: 28 mEq/L (ref 22–29)
Calcium: 9.7 mg/dL (ref 8.4–10.4)
Chloride: 107 mEq/L (ref 98–109)
Creatinine: 0.7 mg/dL (ref 0.6–1.1)
Glucose: 141 mg/dl — ABNORMAL HIGH (ref 70–140)
Potassium: 4.1 mEq/L (ref 3.5–5.1)
Sodium: 145 mEq/L (ref 136–145)
Total Bilirubin: 0.4 mg/dL (ref 0.20–1.20)
Total Protein: 6.7 g/dL (ref 6.4–8.3)

## 2012-10-08 LAB — CBC WITH DIFFERENTIAL/PLATELET
BASO%: 0.6 % (ref 0.0–2.0)
Basophils Absolute: 0 10*3/uL (ref 0.0–0.1)
EOS%: 2.5 % (ref 0.0–7.0)
Eosinophils Absolute: 0.1 10*3/uL (ref 0.0–0.5)
HCT: 33.1 % — ABNORMAL LOW (ref 34.8–46.6)
HGB: 10.4 g/dL — ABNORMAL LOW (ref 11.6–15.9)
LYMPH%: 19.5 % (ref 14.0–49.7)
MCH: 28.4 pg (ref 25.1–34.0)
MCHC: 31.4 g/dL — ABNORMAL LOW (ref 31.5–36.0)
MCV: 90.4 fL (ref 79.5–101.0)
MONO#: 0.5 10*3/uL (ref 0.1–0.9)
MONO%: 9.6 % (ref 0.0–14.0)
NEUT#: 3.2 10*3/uL (ref 1.5–6.5)
NEUT%: 67.8 % (ref 38.4–76.8)
Platelets: 278 10*3/uL (ref 145–400)
RBC: 3.66 10*6/uL — ABNORMAL LOW (ref 3.70–5.45)
RDW: 15.7 % — ABNORMAL HIGH (ref 11.2–14.5)
WBC: 4.8 10*3/uL (ref 3.9–10.3)
lymph#: 0.9 10*3/uL (ref 0.9–3.3)

## 2012-10-08 MED ORDER — SODIUM CHLORIDE 0.9 % IV SOLN
Freq: Once | INTRAVENOUS | Status: AC
  Administered 2012-10-08: 11:00:00 via INTRAVENOUS

## 2012-10-08 MED ORDER — TRASTUZUMAB CHEMO INJECTION 440 MG
2.0000 mg/kg | Freq: Once | INTRAVENOUS | Status: AC
  Administered 2012-10-08: 168 mg via INTRAVENOUS
  Filled 2012-10-08: qty 8

## 2012-10-08 MED ORDER — SODIUM CHLORIDE 0.9 % IJ SOLN
10.0000 mL | INTRAMUSCULAR | Status: DC | PRN
  Administered 2012-10-08: 10 mL
  Filled 2012-10-08: qty 10

## 2012-10-08 MED ORDER — PACLITAXEL PROTEIN-BOUND CHEMO INJECTION 100 MG
100.0000 mg/m2 | Freq: Once | INTRAVENOUS | Status: AC
  Administered 2012-10-08: 200 mg via INTRAVENOUS
  Filled 2012-10-08: qty 40

## 2012-10-08 MED ORDER — DEXAMETHASONE SODIUM PHOSPHATE 10 MG/ML IJ SOLN
10.0000 mg | Freq: Once | INTRAMUSCULAR | Status: AC
  Administered 2012-10-08: 10 mg via INTRAVENOUS

## 2012-10-08 MED ORDER — ACETAMINOPHEN 325 MG PO TABS
650.0000 mg | ORAL_TABLET | Freq: Once | ORAL | Status: AC
  Administered 2012-10-08: 650 mg via ORAL

## 2012-10-08 MED ORDER — HEPARIN SOD (PORK) LOCK FLUSH 100 UNIT/ML IV SOLN
500.0000 [IU] | Freq: Once | INTRAVENOUS | Status: AC | PRN
  Administered 2012-10-08: 500 [IU]
  Filled 2012-10-08: qty 5

## 2012-10-08 MED ORDER — DIPHENHYDRAMINE HCL 25 MG PO CAPS
50.0000 mg | ORAL_CAPSULE | Freq: Once | ORAL | Status: AC
  Administered 2012-10-08: 50 mg via ORAL

## 2012-10-08 MED ORDER — ONDANSETRON 8 MG/50ML IVPB (CHCC)
8.0000 mg | Freq: Once | INTRAVENOUS | Status: AC
  Administered 2012-10-08: 8 mg via INTRAVENOUS

## 2012-10-08 NOTE — Patient Instructions (Signed)
[facility] Discharge Instructions for Patients Receiving Chemotherapy  Today you received the following chemotherapy agents:  Abraxane and Herceptin  To help prevent nausea and vomiting after your treatment, we encourage you to take your nausea medication as ordered per MD.   If you develop nausea and vomiting that is not controlled by your nausea medication, call the clinic.   BELOW ARE SYMPTOMS THAT SHOULD BE REPORTED IMMEDIATELY:  *FEVER GREATER THAN 100.5 F  *CHILLS WITH OR WITHOUT FEVER  NAUSEA AND VOMITING THAT IS NOT CONTROLLED WITH YOUR NAUSEA MEDICATION  *UNUSUAL SHORTNESS OF BREATH  *UNUSUAL BRUISING OR BLEEDING  TENDERNESS IN MOUTH AND THROAT WITH OR WITHOUT PRESENCE OF ULCERS  *URINARY PROBLEMS  *BOWEL PROBLEMS  UNUSUAL RASH Items with * indicate a potential emergency and should be followed up as soon as possible.  Feel free to call the clinic you have any questions or concerns. The clinic phone number is [phone].

## 2012-10-08 NOTE — Progress Notes (Signed)
OFFICE PROGRESS NOTE  CC  [name], MD [address] [place] [address] Dr. 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 Patient is status post bilateral mastectomy with immediate reconstruction  STAGE:  Right breast (mastectomy)  T1cN0 1.2 cm ER-/PR-/her-/ki-67 92% 0/1 nodes positive  Left breast (mastectomy with snl) T1cN0 1.4 cm, 1.2 cm, 0.1c ER 100%, PR 11%, her2neu positive 6.50, Ki-67 80%   PRIOR THERAPY:  #1screening mammogram performed that showed 2 areas of concern on the left side.calcifications were noted in the left. In the right breast a possible mass warranted further evaluation.   #2 On 03/29/2012 patient underwent a bilateral diagnostic mammogram and right breast ultrasound. A spot magnification images demonstrate suspicious group of pleomorphic microcalcifications over the outer lower left breast with additional suspicious group of pleomorphic microcalcifications over the outer midportion of the left periareolar region. Spot compression images of the right breast demonstrate persistence of a 1 cm density at the edge of the film in the deep third of the right inner breast.   #3Ultrasound performed showed no focal abnormality over the entire in her right breast to correspond to the mammographic density. Patient was recommended stereotactic core needle biopsy of the 2 groups of suspicious left breast microcalcifications. Because patient and her husband wanted bilateral mastectomies MRI was not performed.on 04/15/2012 patient had needle core biopsy performed of the 2 areas in the left breast. The left needle core biopsy in the lower breast revealed ductal carcinoma in situ with associated comedo necrosis and calcifications with the in situ carcinoma. It was ER +100% PR +12%. 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 Tumor was ER +100% PR +11% proliferation marker Ki-67 elevated at 80% HER-2/neu showed amplification with a ratio of 6.50.   #4 patient is now status post bilateral mastectomies performed on 05/14/2012 with immediate reconstruction. The final pathology revealed bilateral breast cancers. In the right breast tissue is noted to have a 1.2 cm invasive ductal carcinoma high-grade ER negative PR negative HER-2/neu negative with Ki-67 92% one node was negative for metastatic disease but this was not the sentinel lymph node. On the left side the patient had multifocal disease measuring 1.4 cm, 1.2 cm, 0.1 cm. Tumor was ER +100% PR +11% HER-2/neu positive with a ratio 6.50. Ki-67 was 80% 8 sentinel nodes were negative for metastatic disease.  #5 patient has had immediate reconstruction with expanders bilaterally.  #6s/p adjuvant chemotherapy initially consisting of Adriamycin Cytoxan every 2 weeks for a total of 4 cycles. Given 07/02/12 - 08/12/12  #7This will then be followed by Taxol and Herceptin to be given weekly for 12 weeks time.beginning 08/27/12. This was discontinued due to neuropathy.  #8 Abraxane and Herceptin days 1, day 8, day 15 on a 28 day cycle. Beginning 09/03/2012   CURRENT THERAPY: Abraxane and Herceptin cycle 2 day 8   INTERVAL HISTORY: [name] [name] 62 y.o. female returns for followup visit. overall patient is doing well. She continues to tolerate the abraxane well.  She denies fevers, chills, nausea, vomiting, constipation, diarrhea.  The numbness is stable.  She does occasionally have numbness that resolves with elevating her legs and massaging her feet.  She does have bloody discharge from her nose when she blows it, and is using saline  nasal spray BID.  She does not have spontaneous epistaxis.    MEDICAL HISTORY: Past Medical History  Diagnosis Date  . Heart murmur   .  Diabetes mellitus without complication   . Osteopenia   . Cancer     breast  . Bronchitis   . Pneumonia   . Hiatal hernia   . Sinus problem   . Diabetes mellitus   . Breast cancer 04/15/12    left-biopsy  . Breast cancer, right breast 05/14/12    right mastectomy    ALLERGIES:  is allergic to darvon and penicillins.  MEDICATIONS:  Current Outpatient Prescriptions  Medication Sig Dispense Refill  . Ascorbic Acid (VITAMIN C) 500 MG tablet Take 500 mg by mouth daily.        . Biotin 10 MG TABS Take by mouth. 4000 mcg daily      . calcitonin, salmon, (MIACALCIN/FORTICAL) 200 UNIT/ACT nasal spray Place 1 spray into the nose daily.      . Calcium Citrate (CITRACAL PO) Take by mouth.      . Cholecalciferol (VITAMIN D PO) Take 1 tablet by mouth daily.      Marland Kitchen docusate sodium 100 MG CAPS Take 100 mg by mouth 2 (two) times daily.  30 capsule  1  . fluticasone (FLONASE) 50 MCG/ACT nasal spray Place 2 sprays into the nose daily.  16 g  2  . KRILL OIL PO Take by mouth.      . lidocaine-prilocaine (EMLA) cream Apply topically as needed.  30 g  1  . Multiple Vitamin (MULTIVITAMIN PO) Take 1 tablet by mouth daily.       . pantoprazole (PROTONIX) 40 MG tablet Take 40 mg by mouth daily.        . Probiotic Product (PROBIOTIC DAILY PO) Take by mouth daily.      . rosuvastatin (CRESTOR) 5 MG tablet Take 5 mg by mouth every Monday, Wednesday, and Friday.      . sitaGLIPtin (JANUVIA) 100 MG tablet Take 100 mg by mouth daily.      Marland Kitchen UNABLE TO FIND Apply 1 Device topically once. Cranial prosthesis r/t chemotherapy treatment  1 Device  0   No current facility-administered medications for this visit.    SURGICAL HISTORY:  Past Surgical History  Procedure Laterality Date  . Cesarean section  1981, 1983  . Tummy tuck  1999  . Tonsillectomy    . Foot surgery    . Gastric bypass    . Wound debridement  01/14/2011    Procedure: DEBRIDEMENT ABDOMINAL WOUND;  Surgeon: Ernestene Mention, MD;  Location:  [facility];  Service: General;  Laterality: N/A;  wound debridement and debridement on the abdomen  . Cosmetic surgery    . Total mastectomy Bilateral 05/14/2012    Procedure: TOTAL MASTECTOMY;  Surgeon: Ernestene Mention, MD;  Location: [facility] OR;  Service: General;  Laterality: Bilateral;  . Axillary sentinel node biopsy Left 05/14/2012    Procedure: AXILLARY SENTINEL NODE BIOPSY;  Surgeon: Ernestene Mention, MD;  Location: [facility] OR;  Service: General;  Laterality: Left;  . Portacath placement N/A 05/14/2012    Procedure: INSERTION PORT-A-CATH;  Surgeon: Ernestene Mention, MD;  Location: [facility] OR;  Service: General;  Laterality: N/A;  . Breast reconstruction with placement of tissue expander and flex hd (acellular hydrated dermis) Bilateral 05/14/2012    Procedure: BREAST RECONSTRUCTION WITH PLACEMENT OF TISSUE EXPANDER AND FLEX HD (ACELLULAR HYDRATED DERMIS);  Surgeon: Etter Sjogren, MD;  Location: [facility] OR;  Service: Plastics;  Laterality: Bilateral;  . Bunionectomy      REVIEW OF SYSTEMS:  Pertinent items are noted in HPI.   HEALTH MAINTENANCE:   PHYSICAL EXAMINATION: Blood pressure 140/80, pulse 89, temperature 99.4 F (37.4 C), temperature source Oral, resp. rate 18, height 5\' 4"  (1.626 m), weight 187 lb 4.8 oz (84.959 kg). Body mass index is 32.13 kg/(m^2). General: Patient is a well appearing female in no acute distress HEENT: PERRLA, sclerae anicteric no conjunctival pallor, MMM Neck: supple, no palpable adenopathy Lungs: clear to auscultation bilaterally, no wheezes, rhonchi, or rales Cardiovascular: regular rate rhythm, S1, S2, no murmurs, rubs or gallops Abdomen: Soft, non-tender, non-distended, normoactive bowel sounds, no HSM Extremities: warm and well perfused, no clubbing, cyanosis, or edema Skin: No rashes or lesions Neuro: Non-focal ECOG PERFORMANCE STATUS: 0 - Asymptomatic  LABORATORY DATA: Lab Results  Component Value Date   WBC 4.8 10/08/2012   HGB 10.4* 10/08/2012    HCT 33.1* 10/08/2012   MCV 90.4 10/08/2012   PLT 278 10/08/2012      Chemistry      Component Value Date/Time   NA 145 10/08/2012 0923   NA 139 05/17/2012 0530   K 4.1 10/08/2012 0923   K 3.7 05/17/2012 0530   CL 104 07/30/2012 0955   CL 100 05/17/2012 0530   CO2 28 10/08/2012 0923   CO2 33* 05/17/2012 0530   BUN 8.7 10/08/2012 0923   BUN 4* 05/17/2012 0530   CREATININE 0.7 10/08/2012 0923   CREATININE 0.56 05/17/2012 0530      Component Value Date/Time   CALCIUM 9.7 10/08/2012 0923   CALCIUM 8.7 05/17/2012 0530   ALKPHOS 87 10/08/2012 0923   ALKPHOS 103 05/10/2012 1440   AST 18 10/08/2012 0923   AST 25 05/10/2012 1440   ALT 15 10/08/2012 0923   ALT 17 05/10/2012 1440   BILITOT 0.40 10/08/2012 0923   BILITOT 0.4 05/10/2012 1440    ADDITIONAL INFORMATION: 6. PROGNOSTIC INDICATORS - ACIS Results: IMMUNOHISTOCHEMICAL AND MORPHOMETRIC ANALYSIS BY THE AUTOMATED CELLULAR IMAGING SYSTEM (ACIS) Estrogen Receptor: 0%, NEGATIVE Progesterone Receptor: 0%, NEGATIVE Proliferation Marker Ki67: 92% COMMENT: The negative hormone receptor study(ies) in this case have an internal positive control. REFERENCE RANGE ESTROGEN RECEPTOR NEGATIVE <1% POSITIVE =>1% PROGESTERONE RECEPTOR NEGATIVE <1% POSITIVE =>1% All controls stained appropriately Pecola Leisure MD Pathologist, Electronic Signature ( Signed 05/21/2012) 6. CHROMOGENIC IN-SITU HYBRIDIZATION Results: HER-2/NEU BY CISH - NO AMPLIFICATION OF HER-2 DETECTED. 1 of 6 Duplicate copy FINAL for [place], [name] G [phone]) ADDITIONAL INFORMATION:(continued) RESULT RATIO OF HER2: CEP 17 SIGNALS 1.19 AVERAGE HER2 COPY NUMBER PER CELL 1.85 REFERENCE RANGE NEGATIVE HER2/Chr17 Ratio <2.0 and Average HER2 copy number <4.0 EQUIVOCAL HER2/Chr17 Ratio <2.0 and Average HER2 copy number 4.0 and <6.0 POSITIVE HER2/Chr17 Ratio >=2.0 and/or Average HER2 copy number >=6.0 Pecola Leisure MD Pathologist, Electronic Signature ( Signed 05/20/2012) FINAL  DIAGNOSIS Diagnosis 1. Lymph node, sentinel, biopsy, Left axillary - THERE IS NO EVIDENCE OF CARCINOMA IN 1 OF 1 LYMPH NODE (0/1). 2. Lymph node, sentinel, biopsy, Left axillary - THERE IS NO EVIDENCE OF CARCINOMA IN 1 OF 1 LYMPH NODE (0/1). 3. Lymph node, sentinel, biopsy, Left axillary - THERE IS NO EVIDENCE OF CARCINOMA IN 1 OF 1 LYMPH NODE (0/1). 4. Lymph node, sentinel, biopsy, Left axillary - THERE IS NO EVIDENCE OF CARCINOMA IN 1 OF 1 LYMPH NODE (0/1). 5. Lymph node, sentinel, biopsy, Left axillary - THERE IS NO EVIDENCE OF CARCINOMA IN 1 OF  1 LYMPH NODE (0/1). 6. Breast, simple mastectomy, Right - INVASIVE DUCTAL CARCINOMA, GRADE III/III, SPANNING 1.2 CM. - DUCTAL CARCINOMA IN SITU, HIGH GRADE. - LYMPHOVASCULAR INVASION IS IDENTIFIED. - THERE IS NO EVIDENCE OF CARCINOMA IN 1 OF 1 LYMPH NODE (0/1). - SEE ONCOLOGY TABLE BELOW. 7. Breast, simple mastectomy, Left - INVASIVE DUCTAL CARCINOMA, AT LEAST THREE FOCI, GRADE II/III, SPANNING 1.4 CM, 1.2 CM, AND 0.1 CM. - DUCTAL CARCINOMA IN SITU, INTERMEDIATE GRADE. - LYMPHOVASCULAR INVASION IS IDENTIFIED. - DUCTAL CARCINOMA IN SITU IS FOCALLY 0.2 CM TO THE ANTERIOR/INFERIOR SOFT TISSUE RESECTION MARGIN. - LOBULAR NEOPLASIA (LOBULAR CARCINOMA IN SITU). - THERE IS NO EVIDENCE OF CARCINOMA IN 2 OF 2 LYMPH NODES (0/2). - SEE ONCOLOGY TABLE BELOW. 8. Lymph node, biopsy, Left axillary - THERE IS NO EVIDENCE OF CARCINOMA IN 1 OF 1 LYMPH NODE (0/1). Microscopic Comment 6. BREAST, INVASIVE TUMOR, WITH LYMPH NODE SAMPLING Specimen, including laterality: Right breast Procedure: Simple mastectomy Grade: 3 2 of 6 Duplicate copy FINAL for Derick, [name] G ([id number]) Microscopic Comment(continued) Tubule formation: 3 Nuclear pleomorphism: 3 Mitotic:2 Tumor size (gross measurement): 1.2 cm Margins: Invasive, distance to closest margin: 1.3 cm to the deep margin (gross measurement) In-situ, distance to closest margin: 1.3 cm to the deep  margin (gross measurement) Lymphovascular invasion: Present Ductal carcinoma in situ: Present Grade: High grade Extensive intraductal component: Not identified Lobular neoplasia: Not identified in specimen #6 Tumor focality: Unifocal Treatment effect: N/A Extent of tumor: Confined to breast parenchyma Lymph nodes: # examined: 1 Lymph nodes with metastasis: 0 Breast prognostic profile: Will be performed on the current case and the results reported separately. TNM: pT1c, pN0 7. BREAST, INVASIVE TUMOR, WITH LYMPH NODE SAMPLING Specimen, including laterality: Left breast Procedure: Simple mastectomy Grade: 2 Tubule formation: 3 Nuclear pleomorphism: 2 Mitotic:2 Tumor size (gross measurements) 1.4 cm, 1.2 cm, and 0.1 cm Margins: Invasive, distance to closest margin: 0.7 cm to the anterior/inferior soft tissue resection margin In-situ, distance to closest margin: Focally 0.2 cm to the anterior/inferior soft tissue resection margin (glass slide measurement) Lymphovascular invasion: Present Ductal carcinoma in situ: Present Grade: Intermediate grade Extensive intraductal component: Yes Lobular neoplasia: Yes (lobular carcinoma in situ) Tumor focality: Multiple foci Treatment effect: N/A Extent of tumor: Confined to breast parenchyma Lymph nodes: # examined: 8 Lymph nodes with metastasis: 0 Breast prognostic profile: [phone] Estrogen receptor: 100% strong staining intensity Progesterone receptor: 11% strong staining intensity Her 2 neu: Amplification was detected. The ratio was 6.50. 3 of 6 Duplicate copy FINAL for Fickel, [name] G ([id number]) Microscopic Comment(continued) Ki-67: 80% TNM: mpT1c, pN0 Comments: In addition to the two grossly identified nodules, there is invasive carcinoma present in random tissue submitted from the inferior lateral quadrant. The three foci of carcinoma are morphologically similar.   RADIOGRAPHIC STUDIES:  Nm Pet Image Initial  (pi) Skull Base To Thigh  05/12/2012  *RADIOLOGY REPORT*  Clinical Data: Initial treatment strategy for breast cancer.   NUCLEAR MEDICINE PET SKULL BASE TO THIGH  Fasting Blood Glucose:  143  Technique:  20 mCi F-18 FDG was injected intravenously. CT data was obtained and used for attenuation correction and anatomic localization only.  (This was not acquired as a diagnostic CT examination.) Additional exam technical data entered on technologist worksheet.  Comparison:  none  Findings:  Neck: No hypermetabolic lymph nodes in the neck.  Chest:  In the inferior medial aspect of the right breast there is a 10 mm nodule (image 99) with moderate  metabolic activity (  SUV max = 2.9).  No hypermetabolic mediastinal or hilar lymph nodes.  There is hypermetabolic activity in the thoracic inlet which is felt to be vascular in nature.  There is activity adjacent to the thoracic aorta which is also felt to be vascular may relate to the hemiazygos vein.  Abdomen/Pelvis:  No abnormal hypermetabolic activity within the liver, pancreas, adrenal glands, or spleen.  No hypermetabolic lymph nodes in the abdomen or pelvis.  Skeleton:  No focal hypermetabolic activity to suggest skeletal metastasis.  IMPRESSION:  1.  No evidence of distant breast cancer metastasis. 2.  Hypermetabolic nodule within the inferior medial right breast   Original Report Authenticated By: Genevive Bi, M.D.    Nm Sentinel Node Inj-no Rpt (breast)  05/14/2012  CLINICAL DATA: cancer left breast   Sulfur colloid was injected intradermally by the nuclear medicine  technologist for breast cancer sentinel node localization.     Dg Chest Port 1 View  05/14/2012  *RADIOLOGY REPORT*  Clinical Data: Status post Port-A-Cath placement  PORTABLE CHEST - 1 VIEW  Comparison: None.  Findings: The cardiac shadow is within normal limits.  Bilateral tissue expanders as well as surgical drains are identified.  A right chest wall port is seen. The catheter tip is at the  cavoatrial junction.  No pneumothorax is noted.  The lungs are clear bilaterally.  IMPRESSION: No evidence of pneumothorax following port placement.  Bilateral tissue expanders.   Original Report Authenticated By: Alcide Clever, M.D.    Dg Fluoro Guide Cv Line-no Report  05/14/2012  CLINICAL DATA: LEFT BREAST CANCER/PORT-A-CATH   FLOURO GUIDE CV LINE  Fluoroscopy was utilized by the requesting physician.  No radiographic  interpretation.      ASSESSMENT: 62 year old female with  #1 bilateral breast cancers on the right side she has triple-negative disease stage I left side stage I ER positive PR positive HER-2/neu positive disease. Patient is status post bilateral mastectomies. Of note on the right side the lymph node removed was not a sentinel lymph node. It is unclear what to do with this side in terms of whether or not to do further sentinel lymph node. Clinically patient seems to be doing well.  #2 we discussed her pathology in detail. We discussed chemotherapy to be given adjuvantly. We discussed Adriamycin Cytoxan given every 2 weeks for a total of 4 cycles followed by Taxol and Herceptin given weekly x12 weeks followed by Herceptin every 3 weeks to finish out a total of one year of therapy. We also discussed role of adjuvant hormonal therapy since the left side is ER positive.  #3 patient is now here (5/16) to begin cycle 1 day 1 of Adriamycin and Cytoxan to be given every 2 weeks for a total of 4 cycles. Patient is now status post 4 cycles of Adriamycin Cytoxan given from 07/02/2012 through 08/12/2012. Overall she tolerated the treatment well.  #4 patient will begin Taxol and Herceptin every week for a total of 12 weeks beginning 08/27/2012. Patient understands risks benefits and side effects. Status post one cycle on 08/27/2012 and then discontinued due to neuropathy.  #5 patient will switch to Abraxane and Herceptin 3 weeks on one week off to complete out 12 weeks of therapy.she began  Abraxane and Herceptin starting on 09/03/2012. A total of 4 cycles is planned   PLAN:  #1 patient will proceed with day 8 cycle 2 of Abraxane and Herceptin today.  She is tolerating this well.  #2 echocardiogram shows an ejection fraction between  55 and 60%.  #3 patient's neuropathies are stable.  She will continue elevation and massage.    #4 she will return in one week's time for cycle 2 day 15 of Abraxane and Herceptin.  All questions were answered. The patient knows to call the clinic with any problems, questions or concerns. We can certainly see the patient much sooner if necessary.  I spent 25 minutes counseling the patient face to face. The total time spent in the appointment was 30 minutes.  Cherie Ouch Lyn Hollingshead, NP Medical Oncology [facility] Phone: [phone]

## 2012-10-11 ENCOUNTER — Other Ambulatory Visit: Payer: Self-pay | Admitting: Certified Registered Nurse Anesthetist

## 2012-10-15 ENCOUNTER — Ambulatory Visit (HOSPITAL_BASED_OUTPATIENT_CLINIC_OR_DEPARTMENT_OTHER): Payer: BC Managed Care – PPO

## 2012-10-15 ENCOUNTER — Telehealth: Payer: Self-pay | Admitting: *Deleted

## 2012-10-15 ENCOUNTER — Ambulatory Visit (HOSPITAL_BASED_OUTPATIENT_CLINIC_OR_DEPARTMENT_OTHER): Payer: BC Managed Care – PPO | Admitting: Oncology

## 2012-10-15 ENCOUNTER — Other Ambulatory Visit (HOSPITAL_BASED_OUTPATIENT_CLINIC_OR_DEPARTMENT_OTHER): Payer: BC Managed Care – PPO

## 2012-10-15 ENCOUNTER — Encounter: Payer: Self-pay | Admitting: Oncology

## 2012-10-15 VITALS — BP 135/84 | HR 91 | Temp 98.5°F | Resp 20 | Ht 64.0 in | Wt 189.6 lb

## 2012-10-15 DIAGNOSIS — C50911 Malignant neoplasm of unspecified site of right female breast: Secondary | ICD-10-CM

## 2012-10-15 DIAGNOSIS — C50912 Malignant neoplasm of unspecified site of left female breast: Secondary | ICD-10-CM

## 2012-10-15 DIAGNOSIS — C50919 Malignant neoplasm of unspecified site of unspecified female breast: Secondary | ICD-10-CM

## 2012-10-15 DIAGNOSIS — Z5111 Encounter for antineoplastic chemotherapy: Secondary | ICD-10-CM

## 2012-10-15 DIAGNOSIS — Z170 Estrogen receptor positive status [ER+]: Secondary | ICD-10-CM

## 2012-10-15 DIAGNOSIS — C50119 Malignant neoplasm of central portion of unspecified female breast: Secondary | ICD-10-CM

## 2012-10-15 DIAGNOSIS — Z171 Estrogen receptor negative status [ER-]: Secondary | ICD-10-CM

## 2012-10-15 DIAGNOSIS — Z5112 Encounter for antineoplastic immunotherapy: Secondary | ICD-10-CM

## 2012-10-15 LAB — COMPREHENSIVE METABOLIC PANEL (CC13)
ALT: 15 U/L (ref 0–55)
AST: 17 U/L (ref 5–34)
Albumin: 3.3 g/dL — ABNORMAL LOW (ref 3.5–5.0)
Alkaline Phosphatase: 92 U/L (ref 40–150)
BUN: 7.5 mg/dL (ref 7.0–26.0)
CO2: 28 mEq/L (ref 22–29)
Calcium: 9.6 mg/dL (ref 8.4–10.4)
Chloride: 105 mEq/L (ref 98–109)
Creatinine: 0.6 mg/dL (ref 0.6–1.1)
Glucose: 126 mg/dl (ref 70–140)
Potassium: 3.7 mEq/L (ref 3.5–5.1)
Sodium: 144 mEq/L (ref 136–145)
Total Bilirubin: 0.37 mg/dL (ref 0.20–1.20)
Total Protein: 6.4 g/dL (ref 6.4–8.3)

## 2012-10-15 LAB — CBC WITH DIFFERENTIAL/PLATELET
BASO%: 1.3 % (ref 0.0–2.0)
Basophils Absolute: 0 10*3/uL (ref 0.0–0.1)
EOS%: 2 % (ref 0.0–7.0)
Eosinophils Absolute: 0.1 10*3/uL (ref 0.0–0.5)
HCT: 31.8 % — ABNORMAL LOW (ref 34.8–46.6)
HGB: 10.5 g/dL — ABNORMAL LOW (ref 11.6–15.9)
LYMPH%: 19.9 % (ref 14.0–49.7)
MCH: 29.4 pg (ref 25.1–34.0)
MCHC: 33 g/dL (ref 31.5–36.0)
MCV: 89.2 fL (ref 79.5–101.0)
MONO#: 0.3 10*3/uL (ref 0.1–0.9)
MONO%: 9.5 % (ref 0.0–14.0)
NEUT#: 2.3 10*3/uL (ref 1.5–6.5)
NEUT%: 67.3 % (ref 38.4–76.8)
Platelets: 317 10*3/uL (ref 145–400)
RBC: 3.57 10*6/uL — ABNORMAL LOW (ref 3.70–5.45)
RDW: 17.3 % — ABNORMAL HIGH (ref 11.2–14.5)
WBC: 3.4 10*3/uL — ABNORMAL LOW (ref 3.9–10.3)
lymph#: 0.7 10*3/uL — ABNORMAL LOW (ref 0.9–3.3)

## 2012-10-15 MED ORDER — SODIUM CHLORIDE 0.9 % IV SOLN
Freq: Once | INTRAVENOUS | Status: AC
  Administered 2012-10-15: 11:00:00 via INTRAVENOUS

## 2012-10-15 MED ORDER — SODIUM CHLORIDE 0.9 % IJ SOLN
10.0000 mL | INTRAMUSCULAR | Status: DC | PRN
  Administered 2012-10-15: 10 mL
  Filled 2012-10-15: qty 10

## 2012-10-15 MED ORDER — TRASTUZUMAB CHEMO INJECTION 440 MG
2.0000 mg/kg | Freq: Once | INTRAVENOUS | Status: AC
  Administered 2012-10-15: 168 mg via INTRAVENOUS
  Filled 2012-10-15: qty 8

## 2012-10-15 MED ORDER — ACETAMINOPHEN 325 MG PO TABS
650.0000 mg | ORAL_TABLET | Freq: Once | ORAL | Status: AC
  Administered 2012-10-15: 650 mg via ORAL

## 2012-10-15 MED ORDER — DEXAMETHASONE SODIUM PHOSPHATE 10 MG/ML IJ SOLN
10.0000 mg | Freq: Once | INTRAMUSCULAR | Status: AC
  Administered 2012-10-15: 10 mg via INTRAVENOUS

## 2012-10-15 MED ORDER — DIPHENHYDRAMINE HCL 25 MG PO CAPS
50.0000 mg | ORAL_CAPSULE | Freq: Once | ORAL | Status: AC
  Administered 2012-10-15: 50 mg via ORAL

## 2012-10-15 MED ORDER — PACLITAXEL PROTEIN-BOUND CHEMO INJECTION 100 MG
100.0000 mg/m2 | Freq: Once | INTRAVENOUS | Status: AC
  Administered 2012-10-15: 200 mg via INTRAVENOUS
  Filled 2012-10-15: qty 40

## 2012-10-15 MED ORDER — HEPARIN SOD (PORK) LOCK FLUSH 100 UNIT/ML IV SOLN
500.0000 [IU] | Freq: Once | INTRAVENOUS | Status: AC | PRN
  Administered 2012-10-15: 500 [IU]
  Filled 2012-10-15: qty 5

## 2012-10-15 MED ORDER — ONDANSETRON 8 MG/50ML IVPB (CHCC)
8.0000 mg | Freq: Once | INTRAVENOUS | Status: AC
  Administered 2012-10-15: 8 mg via INTRAVENOUS

## 2012-10-15 NOTE — Telephone Encounter (Signed)
Per staff message and POF I have scheduled appts.  JMW  

## 2012-10-15 NOTE — Telephone Encounter (Signed)
appts made and printed. Pt is aware that tx's will be added. i emailed MW to add the tx...td

## 2012-10-15 NOTE — Patient Instructions (Signed)
[facility] Discharge Instructions for Patients Receiving Chemotherapy  Today you received the following chemotherapy agents abraxane, herceptin  To help prevent nausea and vomiting after your treatment, we encourage you to take your nausea medication as needed   If you develop nausea and vomiting that is not controlled by your nausea medication, call the clinic.   BELOW ARE SYMPTOMS THAT SHOULD BE REPORTED IMMEDIATELY:  *FEVER GREATER THAN 100.5 F  *CHILLS WITH OR WITHOUT FEVER  NAUSEA AND VOMITING THAT IS NOT CONTROLLED WITH YOUR NAUSEA MEDICATION  *UNUSUAL SHORTNESS OF BREATH  *UNUSUAL BRUISING OR BLEEDING  TENDERNESS IN MOUTH AND THROAT WITH OR WITHOUT PRESENCE OF ULCERS  *URINARY PROBLEMS  *BOWEL PROBLEMS  UNUSUAL RASH Items with * indicate a potential emergency and should be followed up as soon as possible.  Feel free to call the clinic you have any questions or concerns. The clinic phone number is [phone].

## 2012-10-15 NOTE — Progress Notes (Signed)
OFFICE PROGRESS NOTE  CC  [name], MD [address] [place] [address] Dr. 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 Patient is status post bilateral mastectomy with immediate reconstruction  STAGE:  Right breast (mastectomy)  T1cN0 1.2 cm ER-/PR-/her-/ki-67 92% 0/1 nodes positive  Left breast (mastectomy with snl) T1cN0 1.4 cm, 1.2 cm, 0.1c ER 100%, PR 11%, her2neu positive 6.50, Ki-67 80%   PRIOR THERAPY:  #1screening mammogram performed that showed 2 areas of concern on the left side.calcifications were noted in the left. In the right breast a possible mass warranted further evaluation.   #2 On 03/29/2012 patient underwent a bilateral diagnostic mammogram and right breast ultrasound. A spot magnification images demonstrate suspicious group of pleomorphic microcalcifications over the outer lower left breast with additional suspicious group of pleomorphic microcalcifications over the outer midportion of the left periareolar region. Spot compression images of the right breast demonstrate persistence of a 1 cm density at the edge of the film in the deep third of the right inner breast.   #3Ultrasound performed showed no focal abnormality over the entire in her right breast to correspond to the mammographic density. Patient was recommended stereotactic core needle biopsy of the 2 groups of suspicious left breast microcalcifications. Because patient and her husband wanted bilateral mastectomies MRI was not performed.on 04/15/2012 patient had needle core biopsy performed of the 2 areas in the left breast. The left needle core biopsy in the lower breast revealed ductal carcinoma in situ with associated comedo necrosis and calcifications with the in situ carcinoma. It was ER +100% PR +12%. 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 Tumor was ER +100% PR +11% proliferation marker Ki-67 elevated at 80% HER-2/neu showed amplification with a ratio of 6.50.   #4 patient is now status post bilateral mastectomies performed on 05/14/2012 with immediate reconstruction. The final pathology revealed bilateral breast cancers. In the right breast tissue is noted to have a 1.2 cm invasive ductal carcinoma high-grade ER negative PR negative HER-2/neu negative with Ki-67 92% one node was negative for metastatic disease but this was not the sentinel lymph node. On the left side the patient had multifocal disease measuring 1.4 cm, 1.2 cm, 0.1 cm. Tumor was ER +100% PR +11% HER-2/neu positive with a ratio 6.50. Ki-67 was 80% 8 sentinel nodes were negative for metastatic disease.  #5 patient has had immediate reconstruction with expanders bilaterally.  #6s/p adjuvant chemotherapy initially consisting of Adriamycin Cytoxan every 2 weeks for a total of 4 cycles. Given 07/02/12 - 08/12/12  #7This will then be followed by Taxol and Herceptin to be given weekly for 12 weeks time.beginning 08/27/12. This was discontinued due to neuropathy.  #8 Abraxane and Herceptin days 1, day 8, day 15 on a 28 day cycle. Beginning 09/03/2012   CURRENT THERAPY: Abraxane and Herceptin cycle 2 day 15  INTERVAL HISTORY: [name] [name] 62 y.o. female returns for followup visit. overall patient is doing well. She continues to tolerate the abraxane well.  She denies fevers, chills, nausea, vomiting, constipation, diarrhea.  The numbness is stable.  She does occasionally have numbness that resolves with elevating her legs and massaging her feet.   Remainder of the 10 point review of systems is negative.  MEDICAL HISTORY: Past Medical History  Diagnosis Date  . Heart murmur   . Diabetes mellitus without complication   . Osteopenia   . Cancer     breast  .  Bronchitis   . Pneumonia   . Hiatal hernia   . Sinus problem   . Diabetes mellitus   . Breast cancer 04/15/12    left-biopsy  . Breast cancer, right breast 05/14/12    right mastectomy    ALLERGIES:  is allergic to darvon and penicillins.  MEDICATIONS:  Current Outpatient Prescriptions  Medication Sig Dispense Refill  . Ascorbic Acid (VITAMIN C) 500 MG tablet Take 500 mg by mouth daily.        . Biotin 10 MG TABS Take by mouth. 4000 mcg daily      . calcitonin, salmon, (MIACALCIN/FORTICAL) 200 UNIT/ACT nasal spray Place 1 spray into the nose daily.      . Calcium Citrate (CITRACAL PO) Take by mouth.      . Cholecalciferol (VITAMIN D PO) Take 1 tablet by mouth daily.      Marland Kitchen docusate sodium 100 MG CAPS Take 100 mg by mouth 2 (two) times daily.  30 capsule  1  . fluticasone (FLONASE) 50 MCG/ACT nasal spray Place 2 sprays into the nose daily.  16 g  2  . KRILL OIL PO Take by mouth.      . lidocaine-prilocaine (EMLA) cream Apply topically as needed.  30 g  1  . Multiple Vitamin (MULTIVITAMIN PO) Take 1 tablet by mouth daily.       . pantoprazole (PROTONIX) 40 MG tablet Take 40 mg by mouth daily.        . Probiotic Product (PROBIOTIC DAILY PO) Take by mouth daily.      . rosuvastatin (CRESTOR) 5 MG tablet Take 5 mg by mouth every Monday, Wednesday, and Friday.      . sitaGLIPtin (JANUVIA) 100 MG tablet Take 100 mg by mouth daily.      Marland Kitchen UNABLE TO FIND Apply 1 Device topically once. Cranial prosthesis r/t chemotherapy treatment  1 Device  0   No current facility-administered medications for this visit.    SURGICAL HISTORY:  Past Surgical History  Procedure Laterality Date  . Cesarean section  1981, 1983  . Tummy tuck  1999  . Tonsillectomy    . Foot surgery    . Gastric bypass    . Wound debridement  01/14/2011    Procedure: DEBRIDEMENT ABDOMINAL WOUND;  Surgeon: Ernestene Mention, MD;  Location: [facility];  Service: General;  Laterality: N/A;  wound debridement and  debridement on the abdomen  . Cosmetic surgery    . Total mastectomy Bilateral 05/14/2012    Procedure: TOTAL MASTECTOMY;  Surgeon: Ernestene Mention, MD;  Location: [facility] OR;  Service: General;  Laterality: Bilateral;  . Axillary sentinel node biopsy Left 05/14/2012    Procedure: AXILLARY SENTINEL NODE BIOPSY;  Surgeon: Ernestene Mention, MD;  Location: [facility] OR;  Service: General;  Laterality: Left;  . Portacath placement N/A 05/14/2012    Procedure: INSERTION PORT-A-CATH;  Surgeon: Ernestene Mention, MD;  Location: [facility] OR;  Service: General;  Laterality: N/A;  . Breast reconstruction with placement of tissue expander and flex hd (acellular hydrated dermis) Bilateral 05/14/2012    Procedure: BREAST RECONSTRUCTION WITH PLACEMENT OF TISSUE EXPANDER AND FLEX HD (ACELLULAR HYDRATED DERMIS);  Surgeon: Etter Sjogren, MD;  Location: [facility] OR;  Service: Plastics;  Laterality: Bilateral;  . Bunionectomy      REVIEW OF  SYSTEMS:  Pertinent items are noted in HPI.   HEALTH MAINTENANCE:   PHYSICAL EXAMINATION: Blood pressure 135/84, pulse 91, temperature 98.5 F (36.9 C), temperature source Oral, resp. rate 20, height 5\' 4"  (1.626 m), weight 189 lb 9.6 oz (86.002 kg). Body mass index is 32.53 kg/(m^2). General: Patient is a well appearing female in no acute distress HEENT: PERRLA, sclerae anicteric no conjunctival pallor, MMM Neck: supple, no palpable adenopathy Lungs: clear to auscultation bilaterally, no wheezes, rhonchi, or rales Cardiovascular: regular rate rhythm, S1, S2, no murmurs, rubs or gallops Abdomen: Soft, non-tender, non-distended, normoactive bowel sounds, no HSM Extremities: warm and well perfused, no clubbing, cyanosis, or edema Skin: No rashes or lesions Neuro: Non-focal ECOG PERFORMANCE STATUS: 0 - Asymptomatic  LABORATORY DATA: Lab Results  Component Value Date   WBC 3.4* 10/15/2012   HGB 10.5* 10/15/2012   HCT 31.8* 10/15/2012   MCV 89.2 10/15/2012   PLT 317 10/15/2012      Chemistry       Component Value Date/Time   NA 145 10/08/2012 0923   NA 139 05/17/2012 0530   K 4.1 10/08/2012 0923   K 3.7 05/17/2012 0530   CL 104 07/30/2012 0955   CL 100 05/17/2012 0530   CO2 28 10/08/2012 0923   CO2 33* 05/17/2012 0530   BUN 8.7 10/08/2012 0923   BUN 4* 05/17/2012 0530   CREATININE 0.7 10/08/2012 0923   CREATININE 0.56 05/17/2012 0530      Component Value Date/Time   CALCIUM 9.7 10/08/2012 0923   CALCIUM 8.7 05/17/2012 0530   ALKPHOS 87 10/08/2012 0923   ALKPHOS 103 05/10/2012 1440   AST 18 10/08/2012 0923   AST 25 05/10/2012 1440   ALT 15 10/08/2012 0923   ALT 17 05/10/2012 1440   BILITOT 0.40 10/08/2012 0923   BILITOT 0.4 05/10/2012 1440    ADDITIONAL INFORMATION: 6. PROGNOSTIC INDICATORS - ACIS Results: IMMUNOHISTOCHEMICAL AND MORPHOMETRIC ANALYSIS BY THE AUTOMATED CELLULAR IMAGING SYSTEM (ACIS) Estrogen Receptor: 0%, NEGATIVE Progesterone Receptor: 0%, NEGATIVE Proliferation Marker Ki67: 92% COMMENT: The negative hormone receptor study(ies) in this case have an internal positive control. REFERENCE RANGE ESTROGEN RECEPTOR NEGATIVE <1% POSITIVE =>1% PROGESTERONE RECEPTOR NEGATIVE <1% POSITIVE =>1% All controls stained appropriately Pecola Leisure MD Pathologist, Electronic Signature ( Signed 05/21/2012) 6. CHROMOGENIC IN-SITU HYBRIDIZATION Results: HER-2/NEU BY CISH - NO AMPLIFICATION OF HER-2 DETECTED. 1 of 6 Duplicate copy FINAL for Fahey, [name] G [phone]) ADDITIONAL INFORMATION:(continued) RESULT RATIO OF HER2: CEP 17 SIGNALS 1.19 AVERAGE HER2 COPY NUMBER PER CELL 1.85 REFERENCE RANGE NEGATIVE HER2/Chr17 Ratio <2.0 and Average HER2 copy number <4.0 EQUIVOCAL HER2/Chr17 Ratio <2.0 and Average HER2 copy number 4.0 and <6.0 POSITIVE HER2/Chr17 Ratio >=2.0 and/or Average HER2 copy number >=6.0 Pecola Leisure MD Pathologist, Electronic Signature ( Signed 05/20/2012) FINAL DIAGNOSIS Diagnosis 1. Lymph node, sentinel, biopsy, Left axillary - THERE IS NO EVIDENCE  OF CARCINOMA IN 1 OF 1 LYMPH NODE (0/1). 2. Lymph node, sentinel, biopsy, Left axillary - THERE IS NO EVIDENCE OF CARCINOMA IN 1 OF 1 LYMPH NODE (0/1). 3. Lymph node, sentinel, biopsy, Left axillary - THERE IS NO EVIDENCE OF CARCINOMA IN 1 OF 1 LYMPH NODE (0/1). 4. Lymph node, sentinel, biopsy, Left axillary - THERE IS NO EVIDENCE OF CARCINOMA IN 1 OF 1 LYMPH NODE (0/1). 5. Lymph node, sentinel, biopsy, Left axillary - THERE IS NO EVIDENCE OF CARCINOMA IN 1 OF 1 LYMPH NODE (0/1). 6. Breast, simple mastectomy, Right - INVASIVE DUCTAL CARCINOMA, GRADE III/III, SPANNING 1.2 CM. -  DUCTAL CARCINOMA IN SITU, HIGH GRADE. - LYMPHOVASCULAR INVASION IS IDENTIFIED. - THERE IS NO EVIDENCE OF CARCINOMA IN 1 OF 1 LYMPH NODE (0/1). - SEE ONCOLOGY TABLE BELOW. 7. Breast, simple mastectomy, Left - INVASIVE DUCTAL CARCINOMA, AT LEAST THREE FOCI, GRADE II/III, SPANNING 1.4 CM, 1.2 CM, AND 0.1 CM. - DUCTAL CARCINOMA IN SITU, INTERMEDIATE GRADE. - LYMPHOVASCULAR INVASION IS IDENTIFIED. - DUCTAL CARCINOMA IN SITU IS FOCALLY 0.2 CM TO THE ANTERIOR/INFERIOR SOFT TISSUE RESECTION MARGIN. - LOBULAR NEOPLASIA (LOBULAR CARCINOMA IN SITU). - THERE IS NO EVIDENCE OF CARCINOMA IN 2 OF 2 LYMPH NODES (0/2). - SEE ONCOLOGY TABLE BELOW. 8. Lymph node, biopsy, Left axillary - THERE IS NO EVIDENCE OF CARCINOMA IN 1 OF 1 LYMPH NODE (0/1). Microscopic Comment 6. BREAST, INVASIVE TUMOR, WITH LYMPH NODE SAMPLING Specimen, including laterality: Right breast Procedure: Simple mastectomy Grade: 3 2 of 6 Duplicate copy FINAL for Carlberg, [name] G ([id number]) Microscopic Comment(continued) Tubule formation: 3 Nuclear pleomorphism: 3 Mitotic:2 Tumor size (gross measurement): 1.2 cm Margins: Invasive, distance to closest margin: 1.3 cm to the deep margin (gross measurement) In-situ, distance to closest margin: 1.3 cm to the deep margin (gross measurement) Lymphovascular invasion: Present Ductal carcinoma in situ:  Present Grade: High grade Extensive intraductal component: Not identified Lobular neoplasia: Not identified in specimen #6 Tumor focality: Unifocal Treatment effect: N/A Extent of tumor: Confined to breast parenchyma Lymph nodes: # examined: 1 Lymph nodes with metastasis: 0 Breast prognostic profile: Will be performed on the current case and the results reported separately. TNM: pT1c, pN0 7. BREAST, INVASIVE TUMOR, WITH LYMPH NODE SAMPLING Specimen, including laterality: Left breast Procedure: Simple mastectomy Grade: 2 Tubule formation: 3 Nuclear pleomorphism: 2 Mitotic:2 Tumor size (gross measurements) 1.4 cm, 1.2 cm, and 0.1 cm Margins: Invasive, distance to closest margin: 0.7 cm to the anterior/inferior soft tissue resection margin In-situ, distance to closest margin: Focally 0.2 cm to the anterior/inferior soft tissue resection margin (glass slide measurement) Lymphovascular invasion: Present Ductal carcinoma in situ: Present Grade: Intermediate grade Extensive intraductal component: Yes Lobular neoplasia: Yes (lobular carcinoma in situ) Tumor focality: Multiple foci Treatment effect: N/A Extent of tumor: Confined to breast parenchyma Lymph nodes: # examined: 8 Lymph nodes with metastasis: 0 Breast prognostic profile: [phone] Estrogen receptor: 100% strong staining intensity Progesterone receptor: 11% strong staining intensity Her 2 neu: Amplification was detected. The ratio was 6.50. 3 of 6 Duplicate copy FINAL for Babic, [name] G ([id number]) Microscopic Comment(continued) Ki-67: 80% TNM: mpT1c, pN0 Comments: In addition to the two grossly identified nodules, there is invasive carcinoma present in random tissue submitted from the inferior lateral quadrant. The three foci of carcinoma are morphologically similar.   RADIOGRAPHIC STUDIES:  Nm Pet Image Initial (pi) Skull Base To Thigh  05/12/2012  *RADIOLOGY REPORT*  Clinical Data: Initial treatment  strategy for breast cancer.   NUCLEAR MEDICINE PET SKULL BASE TO THIGH  Fasting Blood Glucose:  143  Technique:  20 mCi F-18 FDG was injected intravenously. CT data was obtained and used for attenuation correction and anatomic localization only.  (This was not acquired as a diagnostic CT examination.) Additional exam technical data entered on technologist worksheet.  Comparison:  none  Findings:  Neck: No hypermetabolic lymph nodes in the neck.  Chest:  In the inferior medial aspect of the right breast there is a 10 mm nodule (image 99) with moderate  metabolic activity ( SUV max = 2.9).  No hypermetabolic mediastinal or hilar lymph nodes.  There is hypermetabolic activity in the  thoracic inlet which is felt to be vascular in nature.  There is activity adjacent to the thoracic aorta which is also felt to be vascular may relate to the hemiazygos vein.  Abdomen/Pelvis:  No abnormal hypermetabolic activity within the liver, pancreas, adrenal glands, or spleen.  No hypermetabolic lymph nodes in the abdomen or pelvis.  Skeleton:  No focal hypermetabolic activity to suggest skeletal metastasis.  IMPRESSION:  1.  No evidence of distant breast cancer metastasis. 2.  Hypermetabolic nodule within the inferior medial right breast   Original Report Authenticated By: Genevive Bi, M.D.    Nm Sentinel Node Inj-no Rpt (breast)  05/14/2012  CLINICAL DATA: cancer left breast   Sulfur colloid was injected intradermally by the nuclear medicine  technologist for breast cancer sentinel node localization.     Dg Chest Port 1 View  05/14/2012  *RADIOLOGY REPORT*  Clinical Data: Status post Port-A-Cath placement  PORTABLE CHEST - 1 VIEW  Comparison: None.  Findings: The cardiac shadow is within normal limits.  Bilateral tissue expanders as well as surgical drains are identified.  A right chest wall port is seen. The catheter tip is at the cavoatrial junction.  No pneumothorax is noted.  The lungs are clear bilaterally.   IMPRESSION: No evidence of pneumothorax following port placement.  Bilateral tissue expanders.   Original Report Authenticated By: Alcide Clever, M.D.    Dg Fluoro Guide Cv Line-no Report  05/14/2012  CLINICAL DATA: LEFT BREAST CANCER/PORT-A-CATH   FLOURO GUIDE CV LINE  Fluoroscopy was utilized by the requesting physician.  No radiographic  interpretation.      ASSESSMENT: 62 year old female with  #1 bilateral breast cancers on the right side she has triple-negative disease stage I left side stage I ER positive PR positive HER-2/neu positive disease. Patient is status post bilateral mastectomies. Of note on the right side the lymph node removed was not a sentinel lymph node. It is unclear what to do with this side in terms of whether or not to do further sentinel lymph node. Clinically patient seems to be doing well.  #2 we discussed her pathology in detail. We discussed chemotherapy to be given adjuvantly. We discussed Adriamycin Cytoxan given every 2 weeks for a total of 4 cycles followed by Taxol and Herceptin given weekly x12 weeks followed by Herceptin every 3 weeks to finish out a total of one year of therapy. We also discussed role of adjuvant hormonal therapy since the left side is ER positive.  #3 patient is now here (5/16) to begin cycle 1 day 1 of Adriamycin and Cytoxan to be given every 2 weeks for a total of 4 cycles. Patient is now status post 4 cycles of Adriamycin Cytoxan given from 07/02/2012 through 08/12/2012. Overall she tolerated the treatment well.  #4 patient will begin Taxol and Herceptin every week for a total of 12 weeks beginning 08/27/2012. Patient understands risks benefits and side effects. Status post one cycle on 08/27/2012 and then discontinued due to neuropathy.  #5 patient will switch to Abraxane and Herceptin 3 weeks on one week off to complete out 12 weeks of therapy.she began Abraxane and Herceptin starting on 09/03/2012. A total of 4 cycles is planned   PLAN:   #1 patient will proceed with day 15 cycle 2 of Abraxane and Herceptin today.  She is tolerating this well.  #2 echocardiogram shows an ejection fraction between 55 and 60%.  #3 patient's neuropathies are stable.  She will continue elevation and massage.    #  4 she will return in 2 week's time for cycle 3 day 1 of Abraxane and Herceptin.  All questions were answered. The patient knows to call the clinic with any problems, questions or concerns. We can certainly see the patient much sooner if necessary.  I spent 25 minutes counseling the patient face to face. The total time spent in the appointment was 30 minutes.  Drue Second, MD Medical/Oncology [facility] [phone] (beeper) [phone] (Office)  10/15/2012, 9:12 AM

## 2012-10-21 ENCOUNTER — Telehealth: Payer: Self-pay | Admitting: *Deleted

## 2012-10-21 NOTE — Telephone Encounter (Signed)
sw pt informed her that KK will be out of the office on 11/12/12. gv appt for labs@ 9:45am and ov@ 10:15am w/ LA on 11/12/12..the patient is aware.[name] [name]

## 2012-10-22 ENCOUNTER — Ambulatory Visit: Payer: BC Managed Care – PPO | Admitting: Oncology

## 2012-10-22 ENCOUNTER — Other Ambulatory Visit: Payer: BC Managed Care – PPO | Admitting: Lab

## 2012-10-26 ENCOUNTER — Ambulatory Visit (INDEPENDENT_AMBULATORY_CARE_PROVIDER_SITE_OTHER): Payer: BC Managed Care – PPO | Admitting: General Surgery

## 2012-10-26 ENCOUNTER — Encounter (INDEPENDENT_AMBULATORY_CARE_PROVIDER_SITE_OTHER): Payer: Self-pay | Admitting: General Surgery

## 2012-10-26 VITALS — BP 140/80 | HR 76 | Resp 14 | Ht 64.0 in | Wt 192.0 lb

## 2012-10-26 DIAGNOSIS — C50911 Malignant neoplasm of unspecified site of right female breast: Secondary | ICD-10-CM

## 2012-10-26 DIAGNOSIS — C50919 Malignant neoplasm of unspecified site of unspecified female breast: Secondary | ICD-10-CM

## 2012-10-26 DIAGNOSIS — C50912 Malignant neoplasm of unspecified site of left female breast: Secondary | ICD-10-CM

## 2012-10-26 NOTE — Progress Notes (Signed)
Patient ID: [name] [name], female   DOB: Aug 31, 1950, 62 y.o.   MRN: [id number] History:  This patient underwent surgery for what turned out to be bilateral breast cancer on 05/14/2012.  Port-A-Cath was inserted, left total mastectomy and sentinel node biopsy performed. Right total mastectomy performed.  Pathology on the left showed multifocal cancers, stage T1c., N0, grade 2, receptor positive, Her-2 + A cancer was found in the right breast, T1c,., N0, triple negative breast cancer, grade 3. There was one non-sentinel node found.  Dr. Welton Flakes does not think that she needs radiation therapy and the appointment with Dr. Michell Heinrich was canceled. She is receiving adjuvant chemotherapy, and she states that she will receive chemotherapy until July of 2015. Excised surgically she is doing well. Range of motion of shoulders is good. Skin is healthy. She still has her tissue expanders in place but has not had much expansion lately.  ROS: 10 system review of systems is negative except as described above. She has had some weight gain which she attributes to the steroids which he receives with her chemotherapy. She notices fatigue and decreased energy level. She is otherwise in 71  Exam: Patient looks well. Her husband is with her. Neck reveals no adenopathy or mass Lungs: Clear to auscultation bilaterally Heart: Regular rate and rhythm. No ectopy. No tachycardia Breasts bilateral mastectomy and incisions with tissue expanders in place. Port-A-Cath  right infraclavicular area looks normal. Both axillae feel normal. No nodules. No ulcerations. No arm swelling  Assessment:  Left breast cancer, multifocal, receptor positive, T1c,. N0, Her-2 + Right breast cancer, triple negative, T1c, N0.  Recovered uneventfully following above-described bilateral mastectomy with reconstruction  Plan: Continue adjuvant chemotherapy as outlined by Dr. Welton Flakes Ultimately, she will have her tissue expanders exchanged for  permanent implants and the Port-A-Cath removed I will see her back in March, which will be one year postop.    Angelia Mould. Derrell Lolling, M.D., [facility] General and Minimally invasive Surgery Breast and Colorectal Surgery Office:   [phone] Pager:   [phone]

## 2012-10-26 NOTE — Patient Instructions (Signed)
You appear to be well healed following your bilateral mastectomy and implants. The skin is healthy. There are no nodules. There is no abnormality of the lymph nodes. There is no evidence of cancer.  Continue your chemotherapy treatments as outlined by Dr. Welton Flakes.  If possible, take a one hour walk every day. That will be hard at first but eventually you will gain strength.  Return to see Dr. Derrell Lolling in March, 2015.

## 2012-10-29 ENCOUNTER — Telehealth: Payer: Self-pay | Admitting: Oncology

## 2012-10-29 ENCOUNTER — Telehealth: Payer: Self-pay | Admitting: *Deleted

## 2012-10-29 ENCOUNTER — Ambulatory Visit (HOSPITAL_BASED_OUTPATIENT_CLINIC_OR_DEPARTMENT_OTHER): Payer: BC Managed Care – PPO

## 2012-10-29 ENCOUNTER — Ambulatory Visit (HOSPITAL_BASED_OUTPATIENT_CLINIC_OR_DEPARTMENT_OTHER): Payer: BC Managed Care – PPO | Admitting: Oncology

## 2012-10-29 ENCOUNTER — Other Ambulatory Visit (HOSPITAL_BASED_OUTPATIENT_CLINIC_OR_DEPARTMENT_OTHER): Payer: BC Managed Care – PPO | Admitting: Lab

## 2012-10-29 ENCOUNTER — Encounter: Payer: Self-pay | Admitting: Oncology

## 2012-10-29 VITALS — BP 151/80 | HR 88 | Temp 98.5°F | Resp 18 | Ht 64.0 in | Wt 191.8 lb

## 2012-10-29 DIAGNOSIS — C50119 Malignant neoplasm of central portion of unspecified female breast: Secondary | ICD-10-CM

## 2012-10-29 DIAGNOSIS — C50912 Malignant neoplasm of unspecified site of left female breast: Secondary | ICD-10-CM

## 2012-10-29 DIAGNOSIS — C50911 Malignant neoplasm of unspecified site of right female breast: Secondary | ICD-10-CM

## 2012-10-29 DIAGNOSIS — C50919 Malignant neoplasm of unspecified site of unspecified female breast: Secondary | ICD-10-CM

## 2012-10-29 DIAGNOSIS — Z5111 Encounter for antineoplastic chemotherapy: Secondary | ICD-10-CM

## 2012-10-29 DIAGNOSIS — Z5112 Encounter for antineoplastic immunotherapy: Secondary | ICD-10-CM

## 2012-10-29 DIAGNOSIS — M8580 Other specified disorders of bone density and structure, unspecified site: Secondary | ICD-10-CM

## 2012-10-29 DIAGNOSIS — G609 Hereditary and idiopathic neuropathy, unspecified: Secondary | ICD-10-CM

## 2012-10-29 LAB — CBC WITH DIFFERENTIAL/PLATELET
BASO%: 0.9 % (ref 0.0–2.0)
Basophils Absolute: 0 10*3/uL (ref 0.0–0.1)
EOS%: 1.6 % (ref 0.0–7.0)
Eosinophils Absolute: 0.1 10*3/uL (ref 0.0–0.5)
HCT: 34.3 % — ABNORMAL LOW (ref 34.8–46.6)
HGB: 11.3 g/dL — ABNORMAL LOW (ref 11.6–15.9)
LYMPH%: 28.9 % (ref 14.0–49.7)
MCH: 29 pg (ref 25.1–34.0)
MCHC: 32.8 g/dL (ref 31.5–36.0)
MCV: 88.4 fL (ref 79.5–101.0)
MONO#: 0.6 10*3/uL (ref 0.1–0.9)
MONO%: 14.8 % — ABNORMAL HIGH (ref 0.0–14.0)
NEUT#: 2.2 10*3/uL (ref 1.5–6.5)
NEUT%: 53.8 % (ref 38.4–76.8)
Platelets: 315 10*3/uL (ref 145–400)
RBC: 3.88 10*6/uL (ref 3.70–5.45)
RDW: 17 % — ABNORMAL HIGH (ref 11.2–14.5)
WBC: 4.2 10*3/uL (ref 3.9–10.3)
lymph#: 1.2 10*3/uL (ref 0.9–3.3)

## 2012-10-29 LAB — COMPREHENSIVE METABOLIC PANEL (CC13)
ALT: 16 U/L (ref 0–55)
AST: 20 U/L (ref 5–34)
Albumin: 3.3 g/dL — ABNORMAL LOW (ref 3.5–5.0)
Alkaline Phosphatase: 97 U/L (ref 40–150)
BUN: 8.2 mg/dL (ref 7.0–26.0)
CO2: 32 mEq/L — ABNORMAL HIGH (ref 22–29)
Calcium: 9.7 mg/dL (ref 8.4–10.4)
Chloride: 106 mEq/L (ref 98–109)
Creatinine: 0.7 mg/dL (ref 0.6–1.1)
Glucose: 118 mg/dl (ref 70–140)
Potassium: 4.4 mEq/L (ref 3.5–5.1)
Sodium: 145 mEq/L (ref 136–145)
Total Bilirubin: 0.3 mg/dL (ref 0.20–1.20)
Total Protein: 6.4 g/dL (ref 6.4–8.3)

## 2012-10-29 MED ORDER — ONDANSETRON 8 MG/50ML IVPB (CHCC)
8.0000 mg | Freq: Once | INTRAVENOUS | Status: AC
  Administered 2012-10-29: 8 mg via INTRAVENOUS

## 2012-10-29 MED ORDER — LIDOCAINE-PRILOCAINE 2.5-2.5 % EX CREA: TOPICAL_CREAM | CUTANEOUS | Status: DC | PRN

## 2012-10-29 MED ORDER — DEXAMETHASONE SODIUM PHOSPHATE 10 MG/ML IJ SOLN
10.0000 mg | Freq: Once | INTRAMUSCULAR | Status: AC
  Administered 2012-10-29: 10 mg via INTRAVENOUS

## 2012-10-29 MED ORDER — TRASTUZUMAB CHEMO INJECTION 440 MG
2.0000 mg/kg | Freq: Once | INTRAVENOUS | Status: AC
  Administered 2012-10-29: 168 mg via INTRAVENOUS
  Filled 2012-10-29: qty 8

## 2012-10-29 MED ORDER — DIPHENHYDRAMINE HCL 25 MG PO CAPS
50.0000 mg | ORAL_CAPSULE | Freq: Once | ORAL | Status: AC
  Administered 2012-10-29: 50 mg via ORAL

## 2012-10-29 MED ORDER — ACETAMINOPHEN 325 MG PO TABS
650.0000 mg | ORAL_TABLET | Freq: Once | ORAL | Status: AC
  Administered 2012-10-29: 650 mg via ORAL

## 2012-10-29 MED ORDER — SODIUM CHLORIDE 0.9 % IJ SOLN
10.0000 mL | INTRAMUSCULAR | Status: DC | PRN
  Administered 2012-10-29: 10 mL
  Filled 2012-10-29: qty 10

## 2012-10-29 MED ORDER — SODIUM CHLORIDE 0.9 % IV SOLN: Freq: Once | INTRAVENOUS | Status: DC

## 2012-10-29 MED ORDER — SODIUM CHLORIDE 0.9 % IV SOLN
Freq: Once | INTRAVENOUS | Status: AC
  Administered 2012-10-29: 10:00:00 via INTRAVENOUS

## 2012-10-29 MED ORDER — DIPHENHYDRAMINE HCL 25 MG PO CAPS
ORAL_CAPSULE | ORAL | Status: AC
  Filled 2012-10-29: qty 2

## 2012-10-29 MED ORDER — DIPHENHYDRAMINE HCL 25 MG PO CAPS
ORAL_CAPSULE | ORAL | Status: AC
  Filled 2012-10-29: qty 1

## 2012-10-29 MED ORDER — DEXAMETHASONE SODIUM PHOSPHATE 10 MG/ML IJ SOLN
INTRAMUSCULAR | Status: AC
  Filled 2012-10-29: qty 1

## 2012-10-29 MED ORDER — HEPARIN SOD (PORK) LOCK FLUSH 100 UNIT/ML IV SOLN
500.0000 [IU] | Freq: Once | INTRAVENOUS | Status: AC | PRN
  Administered 2012-10-29: 500 [IU]
  Filled 2012-10-29: qty 5

## 2012-10-29 MED ORDER — ACETAMINOPHEN 325 MG PO TABS
ORAL_TABLET | ORAL | Status: AC
  Filled 2012-10-29: qty 2

## 2012-10-29 MED ORDER — PACLITAXEL PROTEIN-BOUND CHEMO INJECTION 100 MG
100.0000 mg/m2 | Freq: Once | INTRAVENOUS | Status: AC
  Administered 2012-10-29: 200 mg via INTRAVENOUS
  Filled 2012-10-29: qty 40

## 2012-10-29 MED ORDER — ONDANSETRON 8 MG/NS 50 ML IVPB
INTRAVENOUS | Status: AC
  Filled 2012-10-29: qty 8

## 2012-10-29 NOTE — Patient Instructions (Signed)
Cancer Center Discharge Instructions for Patients Receiving Chemotherapy  Today you received the following chemotherapy agents: abraxane, herceptin To help prevent nausea and vomiting after your treatment, we encourage you to take your nausea medication.  Take it as often as prescribed.     If you develop nausea and vomiting that is not controlled by your nausea medication, call the clinic. If it is after clinic hours your family physician or the after hours number for the clinic or go to the Emergency Department.   BELOW ARE SYMPTOMS THAT SHOULD BE REPORTED IMMEDIATELY:  *FEVER GREATER THAN 100.5 F  *CHILLS WITH OR WITHOUT FEVER  NAUSEA AND VOMITING THAT IS NOT CONTROLLED WITH YOUR NAUSEA MEDICATION  *UNUSUAL SHORTNESS OF BREATH  *UNUSUAL BRUISING OR BLEEDING  TENDERNESS IN MOUTH AND THROAT WITH OR WITHOUT PRESENCE OF ULCERS  *URINARY PROBLEMS  *BOWEL PROBLEMS  UNUSUAL RASH Items with * indicate a potential emergency and should be followed up as soon as possible.  Feel free to call the clinic you have any questions or concerns. The clinic phone number is [phone].   I have been informed and understand all the instructions given to me. I know to contact the clinic, my physician, or go to the Emergency Department if any problems should occur. I do not have any questions at this time, but understand that I may call the clinic during office hours   should I have any questions or need assistance in obtaining follow up care.    __________________________________________  _____________  __________ Signature of Patient or Authorized Representative            Date                   Time    __________________________________________ Nurse's Signature    

## 2012-10-29 NOTE — Telephone Encounter (Signed)
Per staff message and POF I have scheduled appts.  JMW  

## 2012-10-29 NOTE — Telephone Encounter (Signed)
, °

## 2012-10-29 NOTE — Patient Instructions (Addendum)
Proceed with abraxane  I will see you back in 1 week

## 2012-11-05 ENCOUNTER — Ambulatory Visit (HOSPITAL_BASED_OUTPATIENT_CLINIC_OR_DEPARTMENT_OTHER): Payer: BC Managed Care – PPO | Admitting: Oncology

## 2012-11-05 ENCOUNTER — Other Ambulatory Visit (HOSPITAL_BASED_OUTPATIENT_CLINIC_OR_DEPARTMENT_OTHER): Payer: BC Managed Care – PPO | Admitting: Lab

## 2012-11-05 ENCOUNTER — Ambulatory Visit (HOSPITAL_BASED_OUTPATIENT_CLINIC_OR_DEPARTMENT_OTHER): Payer: BC Managed Care – PPO

## 2012-11-05 VITALS — BP 148/80 | HR 85 | Temp 98.8°F | Resp 20 | Ht 64.0 in | Wt 193.6 lb

## 2012-11-05 DIAGNOSIS — C50911 Malignant neoplasm of unspecified site of right female breast: Secondary | ICD-10-CM

## 2012-11-05 DIAGNOSIS — C50912 Malignant neoplasm of unspecified site of left female breast: Secondary | ICD-10-CM

## 2012-11-05 DIAGNOSIS — C50919 Malignant neoplasm of unspecified site of unspecified female breast: Secondary | ICD-10-CM

## 2012-11-05 DIAGNOSIS — C50119 Malignant neoplasm of central portion of unspecified female breast: Secondary | ICD-10-CM

## 2012-11-05 DIAGNOSIS — Z5112 Encounter for antineoplastic immunotherapy: Secondary | ICD-10-CM

## 2012-11-05 DIAGNOSIS — Z9010 Acquired absence of unspecified breast and nipple: Secondary | ICD-10-CM

## 2012-11-05 DIAGNOSIS — G609 Hereditary and idiopathic neuropathy, unspecified: Secondary | ICD-10-CM

## 2012-11-05 DIAGNOSIS — Z170 Estrogen receptor positive status [ER+]: Secondary | ICD-10-CM

## 2012-11-05 DIAGNOSIS — Z5111 Encounter for antineoplastic chemotherapy: Secondary | ICD-10-CM

## 2012-11-05 DIAGNOSIS — Z23 Encounter for immunization: Secondary | ICD-10-CM

## 2012-11-05 LAB — COMPREHENSIVE METABOLIC PANEL (CC13)
ALT: 14 U/L (ref 0–55)
AST: 18 U/L (ref 5–34)
Albumin: 3.3 g/dL — ABNORMAL LOW (ref 3.5–5.0)
Alkaline Phosphatase: 93 U/L (ref 40–150)
BUN: 6.6 mg/dL — ABNORMAL LOW (ref 7.0–26.0)
CO2: 29 mEq/L (ref 22–29)
Calcium: 9.8 mg/dL (ref 8.4–10.4)
Chloride: 105 mEq/L (ref 98–109)
Creatinine: 0.7 mg/dL (ref 0.6–1.1)
Glucose: 140 mg/dl (ref 70–140)
Potassium: 4.2 mEq/L (ref 3.5–5.1)
Sodium: 144 mEq/L (ref 136–145)
Total Bilirubin: 0.36 mg/dL (ref 0.20–1.20)
Total Protein: 6.4 g/dL (ref 6.4–8.3)

## 2012-11-05 LAB — CBC WITH DIFFERENTIAL/PLATELET
BASO%: 1.1 % (ref 0.0–2.0)
Basophils Absolute: 0 10*3/uL (ref 0.0–0.1)
EOS%: 2.8 % (ref 0.0–7.0)
Eosinophils Absolute: 0.1 10*3/uL (ref 0.0–0.5)
HCT: 33.2 % — ABNORMAL LOW (ref 34.8–46.6)
HGB: 10.9 g/dL — ABNORMAL LOW (ref 11.6–15.9)
LYMPH%: 26.2 % (ref 14.0–49.7)
MCH: 29 pg (ref 25.1–34.0)
MCHC: 32.9 g/dL (ref 31.5–36.0)
MCV: 88.2 fL (ref 79.5–101.0)
MONO#: 0.4 10*3/uL (ref 0.1–0.9)
MONO%: 10.6 % (ref 0.0–14.0)
NEUT#: 2 10*3/uL (ref 1.5–6.5)
NEUT%: 59.3 % (ref 38.4–76.8)
Platelets: 298 10*3/uL (ref 145–400)
RBC: 3.76 10*6/uL (ref 3.70–5.45)
RDW: 16.9 % — ABNORMAL HIGH (ref 11.2–14.5)
WBC: 3.4 10*3/uL — ABNORMAL LOW (ref 3.9–10.3)
lymph#: 0.9 10*3/uL (ref 0.9–3.3)

## 2012-11-05 MED ORDER — INFLUENZA VAC SPLIT QUAD 0.5 ML IM SUSP
0.5000 mL | INTRAMUSCULAR | Status: DC
  Filled 2012-11-05: qty 0.5

## 2012-11-05 MED ORDER — GABAPENTIN 100 MG PO CAPS: ORAL_CAPSULE | ORAL | Status: DC

## 2012-11-05 MED ORDER — ACETAMINOPHEN 325 MG PO TABS
ORAL_TABLET | ORAL | Status: AC
  Filled 2012-11-05: qty 2

## 2012-11-05 MED ORDER — DIPHENHYDRAMINE HCL 25 MG PO CAPS
50.0000 mg | ORAL_CAPSULE | Freq: Once | ORAL | Status: AC
  Administered 2012-11-05: 50 mg via ORAL

## 2012-11-05 MED ORDER — HEPARIN SOD (PORK) LOCK FLUSH 100 UNIT/ML IV SOLN
500.0000 [IU] | Freq: Once | INTRAVENOUS | Status: AC | PRN
  Administered 2012-11-05: 500 [IU]
  Filled 2012-11-05: qty 5

## 2012-11-05 MED ORDER — SODIUM CHLORIDE 0.9 % IJ SOLN
10.0000 mL | INTRAMUSCULAR | Status: DC | PRN
  Administered 2012-11-05: 10 mL
  Filled 2012-11-05: qty 10

## 2012-11-05 MED ORDER — ONDANSETRON 8 MG/NS 50 ML IVPB
INTRAVENOUS | Status: AC
  Filled 2012-11-05: qty 8

## 2012-11-05 MED ORDER — ACETAMINOPHEN 325 MG PO TABS
650.0000 mg | ORAL_TABLET | Freq: Once | ORAL | Status: AC
  Administered 2012-11-05: 650 mg via ORAL

## 2012-11-05 MED ORDER — ONDANSETRON 8 MG/50ML IVPB (CHCC)
8.0000 mg | Freq: Once | INTRAVENOUS | Status: AC
  Administered 2012-11-05: 8 mg via INTRAVENOUS

## 2012-11-05 MED ORDER — DEXAMETHASONE SODIUM PHOSPHATE 10 MG/ML IJ SOLN
10.0000 mg | Freq: Once | INTRAMUSCULAR | Status: AC
  Administered 2012-11-05: 10 mg via INTRAVENOUS

## 2012-11-05 MED ORDER — DEXAMETHASONE SODIUM PHOSPHATE 10 MG/ML IJ SOLN
INTRAMUSCULAR | Status: AC
  Filled 2012-11-05: qty 1

## 2012-11-05 MED ORDER — SODIUM CHLORIDE 0.9 % IV SOLN
Freq: Once | INTRAVENOUS | Status: AC
  Administered 2012-11-05: 11:00:00 via INTRAVENOUS

## 2012-11-05 MED ORDER — PACLITAXEL PROTEIN-BOUND CHEMO INJECTION 100 MG
100.0000 mg/m2 | Freq: Once | INTRAVENOUS | Status: AC
  Administered 2012-11-05: 200 mg via INTRAVENOUS
  Filled 2012-11-05: qty 40

## 2012-11-05 MED ORDER — DIPHENHYDRAMINE HCL 25 MG PO CAPS
ORAL_CAPSULE | ORAL | Status: AC
  Filled 2012-11-05: qty 2

## 2012-11-05 MED ORDER — INFLUENZA VAC SPLIT QUAD 0.5 ML IM SUSP
0.5000 mL | Freq: Once | INTRAMUSCULAR | Status: AC
  Administered 2012-11-05: 0.5 mL via INTRAMUSCULAR
  Filled 2012-11-05: qty 0.5

## 2012-11-05 MED ORDER — TRASTUZUMAB CHEMO INJECTION 440 MG
2.0000 mg/kg | Freq: Once | INTRAVENOUS | Status: AC
  Administered 2012-11-05: 168 mg via INTRAVENOUS
  Filled 2012-11-05: qty 8

## 2012-11-05 NOTE — Patient Instructions (Signed)
[facility] Discharge Instructions for Patients Receiving Chemotherapy  Today you received the following chemotherapy agents Abraxane and Herceptin  To help prevent nausea and vomiting after your treatment, we encourage you to take your nausea medication as prescribed    If you develop nausea and vomiting that is not controlled by your nausea medication, call the clinic.   BELOW ARE SYMPTOMS THAT SHOULD BE REPORTED IMMEDIATELY:  *FEVER GREATER THAN 100.5 F  *CHILLS WITH OR WITHOUT FEVER  NAUSEA AND VOMITING THAT IS NOT CONTROLLED WITH YOUR NAUSEA MEDICATION  *UNUSUAL SHORTNESS OF BREATH  *UNUSUAL BRUISING OR BLEEDING  TENDERNESS IN MOUTH AND THROAT WITH OR WITHOUT PRESENCE OF ULCERS  *URINARY PROBLEMS  *BOWEL PROBLEMS  UNUSUAL RASH Items with * indicate a potential emergency and should be followed up as soon as possible.  Feel free to call the clinic you have any questions or concerns. The clinic phone number is [phone].    

## 2012-11-07 NOTE — Progress Notes (Signed)
OFFICE PROGRESS NOTE  CC  [name] Aliment, MD [address] [place] [address] Dr. 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 Patient is status post bilateral mastectomy with immediate reconstruction  STAGE:  Right breast (mastectomy)  T1cN0 1.2 cm ER-/PR-/her-/ki-67 92% 0/1 nodes positive  Left breast (mastectomy with snl) T1cN0 1.4 cm, 1.2 cm, 0.1c ER 100%, PR 11%, her2neu positive 6.50, Ki-67 80%   PRIOR THERAPY:  #1screening mammogram performed that showed 2 areas of concern on the left side.calcifications were noted in the left. In the right breast a possible mass warranted further evaluation.   #2 On 03/29/2012 patient underwent a bilateral diagnostic mammogram and right breast ultrasound. A spot magnification images demonstrate suspicious group of pleomorphic microcalcifications over the outer lower left breast with additional suspicious group of pleomorphic microcalcifications over the outer midportion of the left periareolar region. Spot compression images of the right breast demonstrate persistence of a 1 cm density at the edge of the film in the deep third of the right inner breast.   #3Ultrasound performed showed no focal abnormality over the entire in her right breast to correspond to the mammographic density. Patient was recommended stereotactic core needle biopsy of the 2 groups of suspicious left breast microcalcifications. Because patient and her husband wanted bilateral mastectomies MRI was not performed.on 04/15/2012 patient had needle core biopsy performed of the 2 areas in the left breast. The left needle core biopsy in the lower breast revealed ductal carcinoma in situ with associated comedo necrosis and calcifications with the in situ carcinoma. It was ER +100% PR +12%. 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 Tumor was ER +100% PR +11% proliferation marker Ki-67 elevated at 80% HER-2/neu showed amplification with a ratio of 6.50.   #4 patient is now status post bilateral mastectomies performed on 05/14/2012 with immediate reconstruction. The final pathology revealed bilateral breast cancers. In the right breast tissue is noted to have a 1.2 cm invasive ductal carcinoma high-grade ER negative PR negative HER-2/neu negative with Ki-67 92% one node was negative for metastatic disease but this was not the sentinel lymph node. On the left side the patient had multifocal disease measuring 1.4 cm, 1.2 cm, 0.1 cm. Tumor was ER +100% PR +11% HER-2/neu positive with a ratio 6.50. Ki-67 was 80% 8 sentinel nodes were negative for metastatic disease.  #5 patient has had immediate reconstruction with expanders bilaterally.  #6s/p adjuvant chemotherapy initially consisting of Adriamycin Cytoxan every 2 weeks for a total of 4 cycles. Given 07/02/12 - 08/12/12  #7This will then be followed by Taxol and Herceptin to be given weekly for 12 weeks time.beginning 08/27/12. This was discontinued due to neuropathy.  #8 Abraxane and Herceptin days 1, day 8, day 15 on a 28 day cycle. Beginning 09/03/2012   CURRENT THERAPY: Adjuvant Abraxane and Herceptin cycle 3 day 8  INTERVAL HISTORY: [place] G Sim[place] 62 y.o. female returns for followup visit. overall patient is doing well. She continues to tolerate the abraxane well.  She denies fevers, chills, nausea, vomiting, constipation, diarrhea.  The numbness is stable.  She does occasionally have numbness that resolves with elevating her legs and massaging her feet.   Remainder of the 10 point review of systems is negative.  MEDICAL HISTORY: Past Medical  History  Diagnosis Date  . Heart murmur   . Diabetes mellitus without complication   . Osteopenia   . Cancer     breast  .  Bronchitis   . Pneumonia   . Hiatal hernia   . Sinus problem   . Diabetes mellitus   . Breast cancer 04/15/12    left-biopsy  . Breast cancer, right breast 05/14/12    right mastectomy    ALLERGIES:  is allergic to darvon and penicillins.  MEDICATIONS:  Current Outpatient Prescriptions  Medication Sig Dispense Refill  . Ascorbic Acid (VITAMIN C) 500 MG tablet Take 500 mg by mouth daily.        . Biotin 10 MG TABS Take by mouth. 4000 mcg daily      . calcitonin, salmon, (MIACALCIN/FORTICAL) 200 UNIT/ACT nasal spray Place 1 spray into the nose daily.      . Calcium Citrate (CITRACAL PO) Take by mouth.      . Cholecalciferol (VITAMIN D PO) Take 1 tablet by mouth daily.      . fluticasone (FLONASE) 50 MCG/ACT nasal spray Place 2 sprays into the nose daily.  16 g  2  . gabapentin (NEURONTIN) 100 MG capsule Take 1 capsule at bedtime  30 capsule  4  . KRILL OIL PO Take by mouth.      . lidocaine-prilocaine (EMLA) cream Apply topically as needed.  30 g  6  . Multiple Vitamin (MULTIVITAMIN PO) Take 1 tablet by mouth daily.       . pantoprazole (PROTONIX) 40 MG tablet Take 40 mg by mouth daily.        . Probiotic Product (PROBIOTIC DAILY PO) Take by mouth daily.      . rosuvastatin (CRESTOR) 5 MG tablet Take 5 mg by mouth every Monday, Wednesday, and Friday.      . sitaGLIPtin (JANUVIA) 100 MG tablet Take 100 mg by mouth daily.      Marland Kitchen UNABLE TO FIND Apply 1 Device topically once. Cranial prosthesis r/t chemotherapy treatment  1 Device  0   No current facility-administered medications for this visit.    SURGICAL HISTORY:  Past Surgical History  Procedure Laterality Date  . Cesarean section  1981, 1983  . Tummy tuck  1999  . Tonsillectomy    . Foot surgery    . Gastric bypass    . Wound debridement  01/14/2011    Procedure: DEBRIDEMENT ABDOMINAL WOUND;  Surgeon: Ernestene Mention, MD;  Location: [facility];  Service: General;  Laterality: N/A;  wound debridement and  debridement on the abdomen  . Cosmetic surgery    . Total mastectomy Bilateral 05/14/2012    Procedure: TOTAL MASTECTOMY;  Surgeon: Ernestene Mention, MD;  Location: [facility] OR;  Service: General;  Laterality: Bilateral;  . Axillary sentinel node biopsy Left 05/14/2012    Procedure: AXILLARY SENTINEL NODE BIOPSY;  Surgeon: Ernestene Mention, MD;  Location: [facility] OR;  Service: General;  Laterality: Left;  . Portacath placement N/A 05/14/2012    Procedure: INSERTION PORT-A-CATH;  Surgeon: Ernestene Mention, MD;  Location: [facility] OR;  Service: General;  Laterality: N/A;  . Breast reconstruction with placement of tissue expander and flex hd (acellular hydrated dermis) Bilateral 05/14/2012    Procedure: BREAST RECONSTRUCTION WITH PLACEMENT OF TISSUE EXPANDER AND FLEX HD (ACELLULAR HYDRATED DERMIS);  Surgeon: Etter Sjogren, MD;  Location: [facility] OR;  Service: Plastics;  Laterality: Bilateral;  . Bunionectomy      REVIEW OF SYSTEMS:  Pertinent items are noted in HPI.   HEALTH MAINTENANCE:   PHYSICAL EXAMINATION: Blood pressure 148/80, pulse 85, temperature 98.8 F (37.1 C), temperature source Oral, resp. rate 20, height 5\' 4"  (1.626 m), weight 193 lb 9.6 oz (87.816 kg). Body mass index is 33.21 kg/(m^2). General: Patient is a well appearing female in no acute distress HEENT: PERRLA, sclerae anicteric no conjunctival pallor, MMM Neck: supple, no palpable adenopathy Lungs: clear to auscultation bilaterally, no wheezes, rhonchi, or rales Cardiovascular: regular rate rhythm, S1, S2, no murmurs, rubs or gallops Abdomen: Soft, non-tender, non-distended, normoactive bowel sounds, no HSM Extremities: warm and well perfused, no clubbing, cyanosis, or edema Skin: No rashes or lesions Neuro: Non-focal ECOG PERFORMANCE STATUS: 0 - Asymptomatic  LABORATORY DATA: Lab Results  Component Value Date   WBC 3.4* 11/05/2012   HGB 10.9* 11/05/2012   HCT 33.2* 11/05/2012   MCV 88.2 11/05/2012   PLT 298 11/05/2012      Chemistry       Component Value Date/Time   NA 144 11/05/2012 0829   NA 139 05/17/2012 0530   K 4.2 11/05/2012 0829   K 3.7 05/17/2012 0530   CL 104 07/30/2012 0955   CL 100 05/17/2012 0530   CO2 29 11/05/2012 0829   CO2 33* 05/17/2012 0530   BUN 6.6* 11/05/2012 0829   BUN 4* 05/17/2012 0530   CREATININE 0.7 11/05/2012 0829   CREATININE 0.56 05/17/2012 0530      Component Value Date/Time   CALCIUM 9.8 11/05/2012 0829   CALCIUM 8.7 05/17/2012 0530   ALKPHOS 93 11/05/2012 0829   ALKPHOS 103 05/10/2012 1440   AST 18 11/05/2012 0829   AST 25 05/10/2012 1440   ALT 14 11/05/2012 0829   ALT 17 05/10/2012 1440   BILITOT 0.36 11/05/2012 0829   BILITOT 0.4 05/10/2012 1440    ADDITIONAL INFORMATION: 6. PROGNOSTIC INDICATORS - ACIS Results: IMMUNOHISTOCHEMICAL AND MORPHOMETRIC ANALYSIS BY THE AUTOMATED CELLULAR IMAGING SYSTEM (ACIS) Estrogen Receptor: 0%, NEGATIVE Progesterone Receptor: 0%, NEGATIVE Proliferation Marker Ki67: 92% COMMENT: The negative hormone receptor study(ies) in this case have an internal positive control. REFERENCE RANGE ESTROGEN RECEPTOR NEGATIVE <1% POSITIVE =>1% PROGESTERONE RECEPTOR NEGATIVE <1% POSITIVE =>1% All controls stained appropriately Pecola Leisure MD Pathologist, Electronic Signature ( Signed 05/21/2012) 6. CHROMOGENIC IN-SITU HYBRIDIZATION Results: HER-2/NEU BY CISH - NO AMPLIFICATION OF HER-2 DETECTED. 1 of 6 Duplicate copy FINAL for [place], [place] G [id number]) ADDITIONAL INFORMATION:(continued) RESULT RATIO OF HER2: CEP 17 SIGNALS 1.19 AVERAGE HER2 COPY NUMBER PER CELL 1.85 REFERENCE RANGE NEGATIVE HER2/Chr17 Ratio <2.0 and Average HER2 copy number <4.0 EQUIVOCAL HER2/Chr17 Ratio <2.0 and Average HER2 copy number 4.0 and <6.0 POSITIVE HER2/Chr17 Ratio >=2.0 and/or Average HER2 copy number >=6.0 Pecola Leisure MD Pathologist, Electronic Signature ( Signed 05/20/2012) FINAL DIAGNOSIS Diagnosis 1. Lymph node, sentinel, biopsy, Left axillary - THERE IS NO EVIDENCE  OF CARCINOMA IN 1 OF 1 LYMPH NODE (0/1). 2. Lymph node, sentinel, biopsy, Left axillary - THERE IS NO EVIDENCE OF CARCINOMA IN 1 OF 1 LYMPH NODE (0/1). 3. Lymph node, sentinel, biopsy, Left axillary - THERE IS NO EVIDENCE OF CARCINOMA IN 1 OF 1 LYMPH NODE (0/1). 4. Lymph node, sentinel, biopsy, Left axillary - THERE IS NO EVIDENCE OF CARCINOMA IN 1 OF 1 LYMPH NODE (0/1). 5. Lymph node, sentinel, biopsy, Left axillary - THERE IS NO EVIDENCE OF CARCINOMA IN 1 OF 1 LYMPH NODE (0/1). 6. Breast, simple mastectomy, Right - INVASIVE DUCTAL CARCINOMA, GRADE III/III, SPANNING 1.2 CM. - DUCTAL CARCINOMA  IN SITU, HIGH GRADE. - LYMPHOVASCULAR INVASION IS IDENTIFIED. - THERE IS NO EVIDENCE OF CARCINOMA IN 1 OF 1 LYMPH NODE (0/1). - SEE ONCOLOGY TABLE BELOW. 7. Breast, simple mastectomy, Left - INVASIVE DUCTAL CARCINOMA, AT LEAST THREE FOCI, GRADE II/III, SPANNING 1.4 CM, 1.2 CM, AND 0.1 CM. - DUCTAL CARCINOMA IN SITU, INTERMEDIATE GRADE. - LYMPHOVASCULAR INVASION IS IDENTIFIED. - DUCTAL CARCINOMA IN SITU IS FOCALLY 0.2 CM TO THE ANTERIOR/INFERIOR SOFT TISSUE RESECTION MARGIN. - LOBULAR NEOPLASIA (LOBULAR CARCINOMA IN SITU). - THERE IS NO EVIDENCE OF CARCINOMA IN 2 OF 2 LYMPH NODES (0/2). - SEE ONCOLOGY TABLE BELOW. 8. Lymph node, biopsy, Left axillary - THERE IS NO EVIDENCE OF CARCINOMA IN 1 OF 1 LYMPH NODE (0/1). Microscopic Comment 6. BREAST, INVASIVE TUMOR, WITH LYMPH NODE SAMPLING Specimen, including laterality: Right breast Procedure: Simple mastectomy Grade: 3 2 of 6 Duplicate copy FINAL for [place], [place] G ([id number]) Microscopic Comment(continued) Tubule formation: 3 Nuclear pleomorphism: 3 Mitotic:2 Tumor size (gross measurement): 1.2 cm Margins: Invasive, distance to closest margin: 1.3 cm to the deep margin (gross measurement) In-situ, distance to closest margin: 1.3 cm to the deep margin (gross measurement) Lymphovascular invasion: Present Ductal carcinoma in situ:  Present Grade: High grade Extensive intraductal component: Not identified Lobular neoplasia: Not identified in specimen #6 Tumor focality: Unifocal Treatment effect: N/A Extent of tumor: Confined to breast parenchyma Lymph nodes: # examined: 1 Lymph nodes with metastasis: 0 Breast prognostic profile: Will be performed on the current case and the results reported separately. TNM: pT1c, pN0 7. BREAST, INVASIVE TUMOR, WITH LYMPH NODE SAMPLING Specimen, including laterality: Left breast Procedure: Simple mastectomy Grade: 2 Tubule formation: 3 Nuclear pleomorphism: 2 Mitotic:2 Tumor size (gross measurements) 1.4 cm, 1.2 cm, and 0.1 cm Margins: Invasive, distance to closest margin: 0.7 cm to the anterior/inferior soft tissue resection margin In-situ, distance to closest margin: Focally 0.2 cm to the anterior/inferior soft tissue resection margin (glass slide measurement) Lymphovascular invasion: Present Ductal carcinoma in situ: Present Grade: Intermediate grade Extensive intraductal component: Yes Lobular neoplasia: Yes (lobular carcinoma in situ) Tumor focality: Multiple foci Treatment effect: N/A Extent of tumor: Confined to breast parenchyma Lymph nodes: # examined: 8 Lymph nodes with metastasis: 0 Breast prognostic profile: [phone] Estrogen receptor: 100% strong staining intensity Progesterone receptor: 11% strong staining intensity Her 2 neu: Amplification was detected. The ratio was 6.50. 3 of 6 Duplicate copy FINAL for [place], [place] G ([id number]) Microscopic Comment(continued) Ki-67: 80% TNM: mpT1c, pN0 Comments: In addition to the two grossly identified nodules, there is invasive carcinoma present in random tissue submitted from the inferior lateral quadrant. The three foci of carcinoma are morphologically similar.   RADIOGRAPHIC STUDIES:  Nm Pet Image Initial (pi) Skull Base To Thigh  05/12/2012  *RADIOLOGY REPORT*  Clinical Data: Initial treatment  strategy for breast cancer.   NUCLEAR MEDICINE PET SKULL BASE TO THIGH  Fasting Blood Glucose:  143  Technique:  20 mCi F-18 FDG was injected intravenously. CT data was obtained and used for attenuation correction and anatomic localization only.  (This was not acquired as a diagnostic CT examination.) Additional exam technical data entered on technologist worksheet.  Comparison:  none  Findings:  Neck: No hypermetabolic lymph nodes in the neck.  Chest:  In the inferior medial aspect of the right breast there is a 10 mm nodule (image 99) with moderate  metabolic activity ( SUV max = 2.9).  No hypermetabolic mediastinal or hilar lymph nodes.  There is hypermetabolic activity in the thoracic inlet  which is felt to be vascular in nature.  There is activity adjacent to the thoracic aorta which is also felt to be vascular may relate to the hemiazygos vein.  Abdomen/Pelvis:  No abnormal hypermetabolic activity within the liver, pancreas, adrenal glands, or spleen.  No hypermetabolic lymph nodes in the abdomen or pelvis.  Skeleton:  No focal hypermetabolic activity to suggest skeletal metastasis.  IMPRESSION:  1.  No evidence of distant breast cancer metastasis. 2.  Hypermetabolic nodule within the inferior medial right breast   Original Report Authenticated By: Genevive Bi, M.D.    Nm Sentinel Node Inj-no Rpt (breast)  05/14/2012  CLINICAL DATA: cancer left breast   Sulfur colloid was injected intradermally by the nuclear medicine  technologist for breast cancer sentinel node localization.     Dg Chest Port 1 View  05/14/2012  *RADIOLOGY REPORT*  Clinical Data: Status post Port-A-Cath placement  PORTABLE CHEST - 1 VIEW  Comparison: None.  Findings: The cardiac shadow is within normal limits.  Bilateral tissue expanders as well as surgical drains are identified.  A right chest wall port is seen. The catheter tip is at the cavoatrial junction.  No pneumothorax is noted.  The lungs are clear bilaterally.   IMPRESSION: No evidence of pneumothorax following port placement.  Bilateral tissue expanders.   Original Report Authenticated By: Alcide Clever, M.D.    Dg Fluoro Guide Cv Line-no Report  05/14/2012  CLINICAL DATA: LEFT BREAST CANCER/PORT-A-CATH   FLOURO GUIDE CV LINE  Fluoroscopy was utilized by the requesting physician.  No radiographic  interpretation.      ASSESSMENT: 62 year old female with  #1 bilateral breast cancers on the right side she has triple-negative disease stage I left side stage I ER positive PR positive HER-2/neu positive disease. Patient is status post bilateral mastectomies. Of note on the right side the lymph node removed was not a sentinel lymph node. It is unclear what to do with this side in terms of whether or not to do further sentinel lymph node. Clinically patient seems to be doing well.  #2 we discussed her pathology in detail. We discussed chemotherapy to be given adjuvantly. We discussed Adriamycin Cytoxan given every 2 weeks for a total of 4 cycles followed by Taxol and Herceptin given weekly x12 weeks followed by Herceptin every 3 weeks to finish out a total of one year of therapy. We also discussed role of adjuvant hormonal therapy since the left side is ER positive.  #3 patient is now here (5/16) to begin cycle 1 day 1 of Adriamycin and Cytoxan to be given every 2 weeks for a total of 4 cycles. Patient is now status post 4 cycles of Adriamycin Cytoxan given from 07/02/2012 through 08/12/2012. Overall she tolerated the treatment well.  #4 patient will begin Taxol and Herceptin every week for a total of 12 weeks beginning 08/27/2012. Patient understands risks benefits and side effects. Status post one cycle on 08/27/2012 and then discontinued due to neuropathy.  #5 patient will switch to Abraxane and Herceptin 3 weeks on one week off to complete out 12 weeks of therapy.she began Abraxane and Herceptin starting on 09/03/2012. A total of 4 cycles is planned   PLAN:   #1 patient will proceed with day 8 cycle 3 of Abraxane and Herceptin today.  She is tolerating this well.  #2 echocardiogram shows an ejection fraction between 55 and 60%.  #3 patient's neuropathies are stable.  She will continue elevation and massage.    #4 she  will return in 2 week's time for cycle 3 day 15 of Abraxane and Herceptin.  All questions were answered. The patient knows to call the clinic with any problems, questions or concerns. We can certainly see the patient much sooner if necessary.  I spent 25 minutes counseling the patient face to face. The total time spent in the appointment was 30 minutes.  Drue Second, MD Medical/Oncology [facility] [phone] (beeper) [phone] (Office)

## 2012-11-07 NOTE — Progress Notes (Signed)
OFFICE PROGRESS NOTE  CC  [name] Aliment, MD [address] [place] [address] Dr. 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 Patient is status post bilateral mastectomy with immediate reconstruction  STAGE:  Right breast (mastectomy)  T1cN0 1.2 cm ER-/PR-/her-/ki-67 92% 0/1 nodes positive  Left breast (mastectomy with snl) T1cN0 1.4 cm, 1.2 cm, 0.1c ER 100%, PR 11%, her2neu positive 6.50, Ki-67 80%   PRIOR THERAPY:  #1screening mammogram performed that showed 2 areas of concern on the left side.calcifications were noted in the left. In the right breast a possible mass warranted further evaluation.   #2 On 03/29/2012 patient underwent a bilateral diagnostic mammogram and right breast ultrasound. A spot magnification images demonstrate suspicious group of pleomorphic microcalcifications over the outer lower left breast with additional suspicious group of pleomorphic microcalcifications over the outer midportion of the left periareolar region. Spot compression images of the right breast demonstrate persistence of a 1 cm density at the edge of the film in the deep third of the right inner breast.   #3Ultrasound performed showed no focal abnormality over the entire in her right breast to correspond to the mammographic density. Patient was recommended stereotactic core needle biopsy of the 2 groups of suspicious left breast microcalcifications. Because patient and her husband wanted bilateral mastectomies MRI was not performed.on 04/15/2012 patient had needle core biopsy performed of the 2 areas in the left breast. The left needle core biopsy in the lower breast revealed ductal carcinoma in situ with associated comedo necrosis and calcifications with the in situ carcinoma. It was ER +100% PR +12%. 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 Tumor was ER +100% PR +11% proliferation marker Ki-67 elevated at 80% HER-2/neu showed amplification with a ratio of 6.50.   #4 patient is now status post bilateral mastectomies performed on 05/14/2012 with immediate reconstruction. The final pathology revealed bilateral breast cancers. In the right breast tissue is noted to have a 1.2 cm invasive ductal carcinoma high-grade ER negative PR negative HER-2/neu negative with Ki-67 92% one node was negative for metastatic disease but this was not the sentinel lymph node. On the left side the patient had multifocal disease measuring 1.4 cm, 1.2 cm, 0.1 cm. Tumor was ER +100% PR +11% HER-2/neu positive with a ratio 6.50. Ki-67 was 80% 8 sentinel nodes were negative for metastatic disease.  #5 patient has had immediate reconstruction with expanders bilaterally.  #6s/p adjuvant chemotherapy initially consisting of Adriamycin Cytoxan every 2 weeks for a total of 4 cycles. Given 07/02/12 - 08/12/12  #7This will then be followed by Taxol and Herceptin to be given weekly for 12 weeks time.beginning 08/27/12. This was discontinued due to neuropathy.  #8 Abraxane and Herceptin days 1, day 8, day 15 on a 28 day cycle. Beginning 09/03/2012   CURRENT THERAPY: Adjuvant Abraxane and Herceptin cycle 3 day 1  INTERVAL HISTORY: [name] [name] 62 y.o. female returns for followup visit. overall patient is doing well. She continues to tolerate the abraxane well.  She denies fevers, chills, nausea, vomiting, constipation, diarrhea.  The numbness is stable.  She does occasionally have numbness that resolves with elevating her legs and massaging her feet.   Remainder of the 10 point review of systems is negative.  MEDICAL HISTORY: Past Medical  History  Diagnosis Date  . Heart murmur   . Diabetes mellitus without complication   . Osteopenia   . Cancer     breast  .  Bronchitis   . Pneumonia   . Hiatal hernia   . Sinus problem   . Diabetes mellitus   . Breast cancer 04/15/12    left-biopsy  . Breast cancer, right breast 05/14/12    right mastectomy    ALLERGIES:  is allergic to darvon and penicillins.  MEDICATIONS:  Current Outpatient Prescriptions  Medication Sig Dispense Refill  . Ascorbic Acid (VITAMIN C) 500 MG tablet Take 500 mg by mouth daily.        . Biotin 10 MG TABS Take by mouth. 4000 mcg daily      . calcitonin, salmon, (MIACALCIN/FORTICAL) 200 UNIT/ACT nasal spray Place 1 spray into the nose daily.      . Calcium Citrate (CITRACAL PO) Take by mouth.      . Cholecalciferol (VITAMIN D PO) Take 1 tablet by mouth daily.      . fluticasone (FLONASE) 50 MCG/ACT nasal spray Place 2 sprays into the nose daily.  16 [name]  2  . KRILL OIL PO Take by mouth.      . lidocaine-prilocaine (EMLA) cream Apply topically as needed.  30 [name]  6  . Multiple Vitamin (MULTIVITAMIN PO) Take 1 tablet by mouth daily.       . pantoprazole (PROTONIX) 40 MG tablet Take 40 mg by mouth daily.        . Probiotic Product (PROBIOTIC DAILY PO) Take by mouth daily.      . rosuvastatin (CRESTOR) 5 MG tablet Take 5 mg by mouth every Monday, Wednesday, and Friday.      . sitaGLIPtin (JANUVIA) 100 MG tablet Take 100 mg by mouth daily.      Marland Kitchen gabapentin (NEURONTIN) 100 MG capsule Take 1 capsule at bedtime  30 capsule  4  . UNABLE TO FIND Apply 1 Device topically once. Cranial prosthesis r/t chemotherapy treatment  1 Device  0   No current facility-administered medications for this visit.    SURGICAL HISTORY:  Past Surgical History  Procedure Laterality Date  . Cesarean section  1981, 1983  . Tummy tuck  1999  . Tonsillectomy    . Foot surgery    . Gastric bypass    . Wound debridement  01/14/2011    Procedure: DEBRIDEMENT ABDOMINAL WOUND;  Surgeon: Ernestene Mention, MD;  Location: [facility];  Service: General;  Laterality: N/A;  wound debridement and  debridement on the abdomen  . Cosmetic surgery    . Total mastectomy Bilateral 05/14/2012    Procedure: TOTAL MASTECTOMY;  Surgeon: Ernestene Mention, MD;  Location: [facility] OR;  Service: General;  Laterality: Bilateral;  . Axillary sentinel node biopsy Left 05/14/2012    Procedure: AXILLARY SENTINEL NODE BIOPSY;  Surgeon: Ernestene Mention, MD;  Location: [facility] OR;  Service: General;  Laterality: Left;  . Portacath placement N/A 05/14/2012    Procedure: INSERTION PORT-A-CATH;  Surgeon: Ernestene Mention, MD;  Location: [facility] OR;  Service: General;  Laterality: N/A;  . Breast reconstruction with placement of tissue expander and flex hd (acellular hydrated dermis) Bilateral 05/14/2012    Procedure: BREAST RECONSTRUCTION WITH PLACEMENT OF TISSUE EXPANDER AND FLEX HD (ACELLULAR HYDRATED DERMIS);  Surgeon: Etter Sjogren, MD;  Location: [facility] OR;  Service: Plastics;  Laterality: Bilateral;  . Bunionectomy      REVIEW OF SYSTEMS:  Pertinent items are noted in HPI.   HEALTH MAINTENANCE:   PHYSICAL EXAMINATION: Blood pressure 151/80, pulse 88, temperature 98.5 F (36.9 C), temperature source Oral, resp. rate 18, height 5\' 4"  (1.626 m), weight 191 lb 12.8 oz (87 kg). Body mass index is 32.91 kg/(m^2). General: Patient is a well appearing female in no acute distress HEENT: PERRLA, sclerae anicteric no conjunctival pallor, MMM Neck: supple, no palpable adenopathy Lungs: clear to auscultation bilaterally, no wheezes, rhonchi, or rales Cardiovascular: regular rate rhythm, S1, S2, no murmurs, rubs or gallops Abdomen: Soft, non-tender, non-distended, normoactive bowel sounds, no HSM Extremities: warm and well perfused, no clubbing, cyanosis, or edema Skin: No rashes or lesions Neuro: Non-focal ECOG PERFORMANCE STATUS: 0 - Asymptomatic  LABORATORY DATA: Lab Results  Component Value Date   WBC 3.4* 11/05/2012   HGB 10.9* 11/05/2012   HCT 33.2* 11/05/2012   MCV 88.2 11/05/2012   PLT 298 11/05/2012      Chemistry       Component Value Date/Time   NA 144 11/05/2012 0829   NA 139 05/17/2012 0530   K 4.2 11/05/2012 0829   K 3.7 05/17/2012 0530   CL 104 07/30/2012 0955   CL 100 05/17/2012 0530   CO2 29 11/05/2012 0829   CO2 33* 05/17/2012 0530   BUN 6.6* 11/05/2012 0829   BUN 4* 05/17/2012 0530   CREATININE 0.7 11/05/2012 0829   CREATININE 0.56 05/17/2012 0530      Component Value Date/Time   CALCIUM 9.8 11/05/2012 0829   CALCIUM 8.7 05/17/2012 0530   ALKPHOS 93 11/05/2012 0829   ALKPHOS 103 05/10/2012 1440   AST 18 11/05/2012 0829   AST 25 05/10/2012 1440   ALT 14 11/05/2012 0829   ALT 17 05/10/2012 1440   BILITOT 0.36 11/05/2012 0829   BILITOT 0.4 05/10/2012 1440    ADDITIONAL INFORMATION: 6. PROGNOSTIC INDICATORS - ACIS Results: IMMUNOHISTOCHEMICAL AND MORPHOMETRIC ANALYSIS BY THE AUTOMATED CELLULAR IMAGING SYSTEM (ACIS) Estrogen Receptor: 0%, NEGATIVE Progesterone Receptor: 0%, NEGATIVE Proliferation Marker Ki67: 92% COMMENT: The negative hormone receptor study(ies) in this case have an internal positive control. REFERENCE RANGE ESTROGEN RECEPTOR NEGATIVE <1% POSITIVE =>1% PROGESTERONE RECEPTOR NEGATIVE <1% POSITIVE =>1% All controls stained appropriately Pecola Leisure MD Pathologist, Electronic Signature ( Signed 05/21/2012) 6. CHROMOGENIC IN-SITU HYBRIDIZATION Results: HER-2/NEU BY CISH - NO AMPLIFICATION OF HER-2 DETECTED. 1 of 6 Duplicate copy FINAL for [name] [name], [name] [name] [id number]) ADDITIONAL INFORMATION:(continued) RESULT RATIO OF HER2: CEP 17 SIGNALS 1.19 AVERAGE HER2 COPY NUMBER PER CELL 1.85 REFERENCE RANGE NEGATIVE HER2/Chr17 Ratio <2.0 and Average HER2 copy number <4.0 EQUIVOCAL HER2/Chr17 Ratio <2.0 and Average HER2 copy number 4.0 and <6.0 POSITIVE HER2/Chr17 Ratio >=2.0 and/or Average HER2 copy number >=6.0 Pecola Leisure MD Pathologist, Electronic Signature ( Signed 05/20/2012) FINAL DIAGNOSIS Diagnosis 1. Lymph node, sentinel, biopsy, Left axillary - THERE IS NO EVIDENCE  OF CARCINOMA IN 1 OF 1 LYMPH NODE (0/1). 2. Lymph node, sentinel, biopsy, Left axillary - THERE IS NO EVIDENCE OF CARCINOMA IN 1 OF 1 LYMPH NODE (0/1). 3. Lymph node, sentinel, biopsy, Left axillary - THERE IS NO EVIDENCE OF CARCINOMA IN 1 OF 1 LYMPH NODE (0/1). 4. Lymph node, sentinel, biopsy, Left axillary - THERE IS NO EVIDENCE OF CARCINOMA IN 1 OF 1 LYMPH NODE (0/1). 5. Lymph node, sentinel, biopsy, Left axillary - THERE IS NO EVIDENCE OF CARCINOMA IN 1 OF 1 LYMPH NODE (0/1). 6. Breast, simple mastectomy, Right - INVASIVE DUCTAL CARCINOMA, GRADE III/III, SPANNING 1.2 CM. - DUCTAL CARCINOMA  IN SITU, HIGH GRADE. - LYMPHOVASCULAR INVASION IS IDENTIFIED. - THERE IS NO EVIDENCE OF CARCINOMA IN 1 OF 1 LYMPH NODE (0/1). - SEE ONCOLOGY TABLE BELOW. 7. Breast, simple mastectomy, Left - INVASIVE DUCTAL CARCINOMA, AT LEAST THREE FOCI, GRADE II/III, SPANNING 1.4 CM, 1.2 CM, AND 0.1 CM. - DUCTAL CARCINOMA IN SITU, INTERMEDIATE GRADE. - LYMPHOVASCULAR INVASION IS IDENTIFIED. - DUCTAL CARCINOMA IN SITU IS FOCALLY 0.2 CM TO THE ANTERIOR/INFERIOR SOFT TISSUE RESECTION MARGIN. - LOBULAR NEOPLASIA (LOBULAR CARCINOMA IN SITU). - THERE IS NO EVIDENCE OF CARCINOMA IN 2 OF 2 LYMPH NODES (0/2). - SEE ONCOLOGY TABLE BELOW. 8. Lymph node, biopsy, Left axillary - THERE IS NO EVIDENCE OF CARCINOMA IN 1 OF 1 LYMPH NODE (0/1). Microscopic Comment 6. BREAST, INVASIVE TUMOR, WITH LYMPH NODE SAMPLING Specimen, including laterality: Right breast Procedure: Simple mastectomy Grade: 3 2 of 6 Duplicate copy FINAL for [name] [name], [name] [name] ([id number]) Microscopic Comment(continued) Tubule formation: 3 Nuclear pleomorphism: 3 Mitotic:2 Tumor size (gross measurement): 1.2 cm Margins: Invasive, distance to closest margin: 1.3 cm to the deep margin (gross measurement) In-situ, distance to closest margin: 1.3 cm to the deep margin (gross measurement) Lymphovascular invasion: Present Ductal carcinoma in situ:  Present Grade: High grade Extensive intraductal component: Not identified Lobular neoplasia: Not identified in specimen #6 Tumor focality: Unifocal Treatment effect: N/A Extent of tumor: Confined to breast parenchyma Lymph nodes: # examined: 1 Lymph nodes with metastasis: 0 Breast prognostic profile: Will be performed on the current case and the results reported separately. TNM: pT1c, pN0 7. BREAST, INVASIVE TUMOR, WITH LYMPH NODE SAMPLING Specimen, including laterality: Left breast Procedure: Simple mastectomy Grade: 2 Tubule formation: 3 Nuclear pleomorphism: 2 Mitotic:2 Tumor size (gross measurements) 1.4 cm, 1.2 cm, and 0.1 cm Margins: Invasive, distance to closest margin: 0.7 cm to the anterior/inferior soft tissue resection margin In-situ, distance to closest margin: Focally 0.2 cm to the anterior/inferior soft tissue resection margin (glass slide measurement) Lymphovascular invasion: Present Ductal carcinoma in situ: Present Grade: Intermediate grade Extensive intraductal component: Yes Lobular neoplasia: Yes (lobular carcinoma in situ) Tumor focality: Multiple foci Treatment effect: N/A Extent of tumor: Confined to breast parenchyma Lymph nodes: # examined: 8 Lymph nodes with metastasis: 0 Breast prognostic profile: [phone] Estrogen receptor: 100% strong staining intensity Progesterone receptor: 11% strong staining intensity Her 2 neu: Amplification was detected. The ratio was 6.50. 3 of 6 Duplicate copy FINAL for [name] [name], [name] [name] ([id number]) Microscopic Comment(continued) Ki-67: 80% TNM: mpT1c, pN0 Comments: In addition to the two grossly identified nodules, there is invasive carcinoma present in random tissue submitted from the inferior lateral quadrant. The three foci of carcinoma are morphologically similar.   RADIOGRAPHIC STUDIES:  Nm Pet Image Initial (pi) Skull Base To Thigh  05/12/2012  *RADIOLOGY REPORT*  Clinical Data: Initial treatment  strategy for breast cancer.   NUCLEAR MEDICINE PET SKULL BASE TO THIGH  Fasting Blood Glucose:  143  Technique:  20 mCi F-18 FDG was injected intravenously. CT data was obtained and used for attenuation correction and anatomic localization only.  (This was not acquired as a diagnostic CT examination.) Additional exam technical data entered on technologist worksheet.  Comparison:  none  Findings:  Neck: No hypermetabolic lymph nodes in the neck.  Chest:  In the inferior medial aspect of the right breast there is a 10 mm nodule (image 99) with moderate  metabolic activity ( SUV max = 2.9).  No hypermetabolic mediastinal or hilar lymph nodes.  There is hypermetabolic activity in the thoracic inlet  which is felt to be vascular in nature.  There is activity adjacent to the thoracic aorta which is also felt to be vascular may relate to the hemiazygos vein.  Abdomen/Pelvis:  No abnormal hypermetabolic activity within the liver, pancreas, adrenal glands, or spleen.  No hypermetabolic lymph nodes in the abdomen or pelvis.  Skeleton:  No focal hypermetabolic activity to suggest skeletal metastasis.  IMPRESSION:  1.  No evidence of distant breast cancer metastasis. 2.  Hypermetabolic nodule within the inferior medial right breast   Original Report Authenticated By: Genevive Bi, M.D.    Nm Sentinel Node Inj-no Rpt (breast)  05/14/2012  CLINICAL DATA: cancer left breast   Sulfur colloid was injected intradermally by the nuclear medicine  technologist for breast cancer sentinel node localization.     Dg Chest Port 1 View  05/14/2012  *RADIOLOGY REPORT*  Clinical Data: Status post Port-A-Cath placement  PORTABLE CHEST - 1 VIEW  Comparison: None.  Findings: The cardiac shadow is within normal limits.  Bilateral tissue expanders as well as surgical drains are identified.  A right chest wall port is seen. The catheter tip is at the cavoatrial junction.  No pneumothorax is noted.  The lungs are clear bilaterally.   IMPRESSION: No evidence of pneumothorax following port placement.  Bilateral tissue expanders.   Original Report Authenticated By: Alcide Clever, M.D.    Dg Fluoro Guide Cv Line-no Report  05/14/2012  CLINICAL DATA: LEFT BREAST CANCER/PORT-A-CATH   FLOURO GUIDE CV LINE  Fluoroscopy was utilized by the requesting physician.  No radiographic  interpretation.      ASSESSMENT: 62 year old female with  #1 bilateral breast cancers on the right side she has triple-negative disease stage I left side stage I ER positive PR positive HER-2/neu positive disease. Patient is status post bilateral mastectomies. Of note on the right side the lymph node removed was not a sentinel lymph node. It is unclear what to do with this side in terms of whether or not to do further sentinel lymph node. Clinically patient seems to be doing well.  #2 we discussed her pathology in detail. We discussed chemotherapy to be given adjuvantly. We discussed Adriamycin Cytoxan given every 2 weeks for a total of 4 cycles followed by Taxol and Herceptin given weekly x12 weeks followed by Herceptin every 3 weeks to finish out a total of one year of therapy. We also discussed role of adjuvant hormonal therapy since the left side is ER positive.  #3 patient is now here (5/16) to begin cycle 1 day 1 of Adriamycin and Cytoxan to be given every 2 weeks for a total of 4 cycles. Patient is now status post 4 cycles of Adriamycin Cytoxan given from 07/02/2012 through 08/12/2012. Overall she tolerated the treatment well.  #4 patient will begin Taxol and Herceptin every week for a total of 12 weeks beginning 08/27/2012. Patient understands risks benefits and side effects. Status post one cycle on 08/27/2012 and then discontinued due to neuropathy.  #5 patient will switch to Abraxane and Herceptin 3 weeks on one week off to complete out 12 weeks of therapy.she began Abraxane and Herceptin starting on 09/03/2012. A total of 4 cycles is planned   PLAN:   #1 patient will proceed with day 1 cycle 3 of Abraxane and Herceptin today.  She is tolerating this well.  #2 echocardiogram shows an ejection fraction between 55 and 60%.  #3 patient's neuropathies are stable.  She will continue elevation and massage.    #4 she  will return in 2 week's time for cycle 3 day 8 of Abraxane and Herceptin.  All questions were answered. The patient knows to call the clinic with any problems, questions or concerns. We can certainly see the patient much sooner if necessary.  I spent 25 minutes counseling the patient face to face. The total time spent in the appointment was 30 minutes.  Drue Second, MD Medical/Oncology [facility] [phone] (beeper) [phone] (Office)  11/07/2012, 3:35 AM

## 2012-11-11 ENCOUNTER — Other Ambulatory Visit: Payer: BC Managed Care – PPO | Admitting: Lab

## 2012-11-11 ENCOUNTER — Encounter: Payer: Self-pay | Admitting: Genetic Counselor

## 2012-11-11 ENCOUNTER — Ambulatory Visit (HOSPITAL_BASED_OUTPATIENT_CLINIC_OR_DEPARTMENT_OTHER): Payer: BC Managed Care – PPO | Admitting: Genetic Counselor

## 2012-11-11 DIAGNOSIS — C50911 Malignant neoplasm of unspecified site of right female breast: Secondary | ICD-10-CM

## 2012-11-11 DIAGNOSIS — C50919 Malignant neoplasm of unspecified site of unspecified female breast: Secondary | ICD-10-CM

## 2012-11-11 DIAGNOSIS — Z803 Family history of malignant neoplasm of breast: Secondary | ICD-10-CM

## 2012-11-11 DIAGNOSIS — C50912 Malignant neoplasm of unspecified site of left female breast: Secondary | ICD-10-CM

## 2012-11-11 NOTE — Progress Notes (Signed)
Dr.  Drue Second requested a consultation for genetic counseling and risk assessment for [name] [name], a 62 y.o. female, for discussion of her personal history of bilateral breast cancer and family history of breast cancer.  She presents to clinic today to discuss the possibility of a genetic predisposition to cancer, and to further clarify her risks, as well as her family members' risks for cancer.   HISTORY OF PRESENT ILLNESS: In 2014, at the age of 25, [name] [name] was diagnosed with invasive ductal carcinoma of the left breast. This was treated with bilateral mastectomy and chemotherapy.  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  After the mastectomy, [name] was found to have had a breast cancer in her right breast.  The tumor was triple negative.    Past Medical History  Diagnosis Date  . Heart murmur   . Diabetes mellitus without complication   . Osteopenia   . Cancer     breast  . Bronchitis   . Pneumonia   . Hiatal hernia   . Sinus problem   . Diabetes mellitus   . Breast cancer 04/15/12    left-biopsy  . Breast cancer, right breast 05/14/12    right mastectomy    Past Surgical History  Procedure Laterality Date  . Cesarean section  1981, 1983  . Tummy tuck  1999  . Tonsillectomy    . Foot surgery    . Gastric bypass    . Wound debridement  01/14/2011    Procedure: DEBRIDEMENT ABDOMINAL WOUND;  Surgeon: Ernestene Mention, MD;  Location: [facility];  Service: General;  Laterality: N/A;  wound debridement and debridement on the abdomen  . Cosmetic surgery    . Total mastectomy Bilateral 05/14/2012    Procedure: TOTAL MASTECTOMY;  Surgeon: Ernestene Mention, MD;  Location: [facility] OR;  Service: General;  Laterality: Bilateral;  . Axillary sentinel node biopsy Left 05/14/2012    Procedure: AXILLARY SENTINEL NODE BIOPSY;  Surgeon: Ernestene Mention, MD;  Location: [facility] OR;  Service: General;  Laterality: Left;  . Portacath placement N/A 05/14/2012    Procedure:  INSERTION PORT-A-CATH;  Surgeon: Ernestene Mention, MD;  Location: [facility] OR;  Service: General;  Laterality: N/A;  . Breast reconstruction with placement of tissue expander and flex hd (acellular hydrated dermis) Bilateral 05/14/2012    Procedure: BREAST RECONSTRUCTION WITH PLACEMENT OF TISSUE EXPANDER AND FLEX HD (ACELLULAR HYDRATED DERMIS);  Surgeon: Etter Sjogren, MD;  Location: [facility] OR;  Service: Plastics;  Laterality: Bilateral;  . Bunionectomy      History   Social History  . Marital Status: Married    Spouse Name: Joe    Number of Children: 2  . Years of Education: 18   Occupational History  . Print production planner    Social History Main Topics  . Smoking status: Former Games developer  . Smokeless tobacco: Never Used  . Alcohol Use: 1.2 - 1.8 oz/week    2-3 Glasses of wine per week  . Drug Use: No  . Sexual Activity: Yes    Partners: Male    Birth Control/ Protection: Post-menopausal   Other Topics Concern  . None   Social History Narrative  . None    REPRODUCTIVE HISTORY AND PERSONAL RISK ASSESSMENT FACTORS: Menarche was at age 60.   postmenopausal Uterus Intact: yes Ovaries Intact: yes G2P2A0, first live birth at age 62  She has not previously undergone treatment for infertility.   Oral Contraceptive use: 1 years  She has not used HRT in the past.    FAMILY HISTORY:  We obtained a detailed, 4-generation family history.  Significant diagnoses are listed below: Family History  Problem Relation Age of Onset  . Heart attack Father   . Diabetes Father   . Heart attack Brother   . Diabetes Brother   . Diabetes Sister   . Breast cancer Sister 59  . Diabetes Mother   . Breast cancer Mother 81  . Parkinson's disease Sister   . Heart attack Brother     Patient's maternal ancestors are of [place] American, Caucasian, [place] and [place] descent, and paternal ancestors are of [place], Caucasian, [place] and [place] descent. There is no reported Ashkenazi  Jewish ancestry. There is no known consanguinity.  GENETIC COUNSELING ASSESSMENT: [name] [name] is a 62 y.o. female with a personal history of bilateral breast cancer and family history of breast cancer which somewhat suggestive of a hereditary breast cancer syndrome and predisposition to cancer. We, therefore, discussed and recommended the following at today's visit.   DISCUSSION: We reviewed the characteristics, features and inheritance patterns of hereditary cancer syndromes. We also discussed genetic testing, including the appropriate family members to test, the process of testing, insurance coverage and turn-around-time for results. We discussed that the breast cancer in her family is post-menopausal, suggesting more of a familial occurrence rather than hereditary cancer syndrome.  However, she meets the criteria for genetic testing based on her diagnosis and having two close relative with breast cancer.  PLAN: After considering the risks, benefits, and limitations, [name] [name] provided informed consent to pursue genetic testing and the blood sample will be sent to [facility] for analysis of the Breast/Ovarian cancer panel. She will have her blood drawn tomorrow, when she is seen for blood work for her chemotherapy.  We discussed the implications of a positive, negative and/ or variant of uncertain significance genetic test result. Results should be available within approximately 3-4 weeks' time, at which point they will be disclosed by telephone to [name] [name], as will any additional recommendations warranted by these results. [name] [name] will receive a summary of her genetic counseling visit and a copy of her results once available. This information will also be available in Epic. We encouraged [name] [name] to remain in contact with cancer genetics annually so that we can continuously update the family history and inform her of any changes in cancer genetics and  testing that may be of benefit for her family. [name] [name]'s questions were answered to her satisfaction today. Our contact information was provided should additional questions or concerns arise.  The patient was seen for a total of 60 minutes, greater than 50% of which was spent face-to-face counseling.  This plan is being carried out per Dr. Feliz Beam recommendations.  This note will also be sent to the referring provider via the electronic medical record. The patient will be supplied with a summary of this genetic counseling discussion as well as educational information on the discussed hereditary cancer syndromes following the conclusion of their visit.   Patient was discussed with Dr. Drue Second.   _______________________________________________________________________ For Office Staff:  Number of people involved in session: 2 Was an Intern/ student involved with case: no

## 2012-11-12 ENCOUNTER — Other Ambulatory Visit: Payer: BC Managed Care – PPO | Admitting: Lab

## 2012-11-12 ENCOUNTER — Ambulatory Visit (HOSPITAL_BASED_OUTPATIENT_CLINIC_OR_DEPARTMENT_OTHER): Payer: BC Managed Care – PPO | Admitting: Oncology

## 2012-11-12 ENCOUNTER — Other Ambulatory Visit (HOSPITAL_BASED_OUTPATIENT_CLINIC_OR_DEPARTMENT_OTHER): Payer: BC Managed Care – PPO | Admitting: Lab

## 2012-11-12 ENCOUNTER — Ambulatory Visit (HOSPITAL_BASED_OUTPATIENT_CLINIC_OR_DEPARTMENT_OTHER): Payer: BC Managed Care – PPO

## 2012-11-12 ENCOUNTER — Encounter: Payer: Self-pay | Admitting: Oncology

## 2012-11-12 ENCOUNTER — Telehealth: Payer: Self-pay | Admitting: Oncology

## 2012-11-12 ENCOUNTER — Telehealth: Payer: Self-pay | Admitting: *Deleted

## 2012-11-12 ENCOUNTER — Ambulatory Visit: Payer: BC Managed Care – PPO | Admitting: Oncology

## 2012-11-12 VITALS — BP 144/88 | HR 86 | Temp 98.5°F | Resp 20 | Ht 64.0 in | Wt 190.0 lb

## 2012-11-12 DIAGNOSIS — C50919 Malignant neoplasm of unspecified site of unspecified female breast: Secondary | ICD-10-CM

## 2012-11-12 DIAGNOSIS — Z5111 Encounter for antineoplastic chemotherapy: Secondary | ICD-10-CM

## 2012-11-12 DIAGNOSIS — C50912 Malignant neoplasm of unspecified site of left female breast: Secondary | ICD-10-CM

## 2012-11-12 DIAGNOSIS — C50911 Malignant neoplasm of unspecified site of right female breast: Secondary | ICD-10-CM

## 2012-11-12 DIAGNOSIS — Z5112 Encounter for antineoplastic immunotherapy: Secondary | ICD-10-CM

## 2012-11-12 DIAGNOSIS — C50119 Malignant neoplasm of central portion of unspecified female breast: Secondary | ICD-10-CM

## 2012-11-12 LAB — CBC WITH DIFFERENTIAL/PLATELET
BASO%: 1.2 % (ref 0.0–2.0)
Basophils Absolute: 0 10*3/uL (ref 0.0–0.1)
EOS%: 1.8 % (ref 0.0–7.0)
Eosinophils Absolute: 0.1 10*3/uL (ref 0.0–0.5)
HCT: 34.6 % — ABNORMAL LOW (ref 34.8–46.6)
HGB: 11.4 g/dL — ABNORMAL LOW (ref 11.6–15.9)
LYMPH%: 23.9 % (ref 14.0–49.7)
MCH: 29.1 pg (ref 25.1–34.0)
MCHC: 32.9 g/dL (ref 31.5–36.0)
MCV: 88.3 fL (ref 79.5–101.0)
MONO#: 0.3 10*3/uL (ref 0.1–0.9)
MONO%: 8.6 % (ref 0.0–14.0)
NEUT#: 2 10*3/uL (ref 1.5–6.5)
NEUT%: 64.5 % (ref 38.4–76.8)
Platelets: 291 10*3/uL (ref 145–400)
RBC: 3.92 10*6/uL (ref 3.70–5.45)
RDW: 17.4 % — ABNORMAL HIGH (ref 11.2–14.5)
WBC: 3.1 10*3/uL — ABNORMAL LOW (ref 3.9–10.3)
lymph#: 0.7 10*3/uL — ABNORMAL LOW (ref 0.9–3.3)

## 2012-11-12 LAB — COMPREHENSIVE METABOLIC PANEL (CC13)
ALT: 18 U/L (ref 0–55)
AST: 23 U/L (ref 5–34)
Albumin: 3.5 g/dL (ref 3.5–5.0)
Alkaline Phosphatase: 95 U/L (ref 40–150)
BUN: 5.7 mg/dL — ABNORMAL LOW (ref 7.0–26.0)
CO2: 27 mEq/L (ref 22–29)
Calcium: 9.9 mg/dL (ref 8.4–10.4)
Chloride: 105 mEq/L (ref 98–109)
Creatinine: 0.7 mg/dL (ref 0.6–1.1)
Glucose: 132 mg/dl (ref 70–140)
Potassium: 4.4 mEq/L (ref 3.5–5.1)
Sodium: 143 mEq/L (ref 136–145)
Total Bilirubin: 0.38 mg/dL (ref 0.20–1.20)
Total Protein: 6.7 g/dL (ref 6.4–8.3)

## 2012-11-12 MED ORDER — ONDANSETRON 8 MG/NS 50 ML IVPB
INTRAVENOUS | Status: AC
  Filled 2012-11-12: qty 8

## 2012-11-12 MED ORDER — DEXAMETHASONE SODIUM PHOSPHATE 10 MG/ML IJ SOLN
10.0000 mg | Freq: Once | INTRAMUSCULAR | Status: AC
  Administered 2012-11-12: 10 mg via INTRAVENOUS

## 2012-11-12 MED ORDER — SODIUM CHLORIDE 0.9 % IV SOLN
2.0000 mg/kg | Freq: Once | INTRAVENOUS | Status: AC
  Administered 2012-11-12: 168 mg via INTRAVENOUS
  Filled 2012-11-12: qty 8

## 2012-11-12 MED ORDER — ACETAMINOPHEN 325 MG PO TABS
ORAL_TABLET | ORAL | Status: AC
  Filled 2012-11-12: qty 2

## 2012-11-12 MED ORDER — ONDANSETRON 8 MG/50ML IVPB (CHCC)
8.0000 mg | Freq: Once | INTRAVENOUS | Status: AC
  Administered 2012-11-12: 8 mg via INTRAVENOUS

## 2012-11-12 MED ORDER — DIPHENHYDRAMINE HCL 25 MG PO CAPS
ORAL_CAPSULE | ORAL | Status: AC
  Filled 2012-11-12: qty 2

## 2012-11-12 MED ORDER — PACLITAXEL PROTEIN-BOUND CHEMO INJECTION 100 MG
100.0000 mg/m2 | Freq: Once | INTRAVENOUS | Status: AC
  Administered 2012-11-12: 200 mg via INTRAVENOUS
  Filled 2012-11-12: qty 40

## 2012-11-12 MED ORDER — HEPARIN SOD (PORK) LOCK FLUSH 100 UNIT/ML IV SOLN
500.0000 [IU] | Freq: Once | INTRAVENOUS | Status: AC | PRN
  Administered 2012-11-12: 500 [IU]
  Filled 2012-11-12: qty 5

## 2012-11-12 MED ORDER — DIPHENHYDRAMINE HCL 25 MG PO CAPS
50.0000 mg | ORAL_CAPSULE | Freq: Once | ORAL | Status: AC
  Administered 2012-11-12: 50 mg via ORAL

## 2012-11-12 MED ORDER — ACETAMINOPHEN 325 MG PO TABS
650.0000 mg | ORAL_TABLET | Freq: Once | ORAL | Status: AC
  Administered 2012-11-12: 650 mg via ORAL

## 2012-11-12 MED ORDER — SODIUM CHLORIDE 0.9 % IV SOLN
Freq: Once | INTRAVENOUS | Status: AC
  Administered 2012-11-12: 11:00:00 via INTRAVENOUS

## 2012-11-12 MED ORDER — DEXAMETHASONE SODIUM PHOSPHATE 10 MG/ML IJ SOLN
INTRAMUSCULAR | Status: AC
  Filled 2012-11-12: qty 1

## 2012-11-12 MED ORDER — SODIUM CHLORIDE 0.9 % IJ SOLN
10.0000 mL | INTRAMUSCULAR | Status: DC | PRN
  Administered 2012-11-12: 10 mL
  Filled 2012-11-12: qty 10

## 2012-11-12 NOTE — Telephone Encounter (Signed)
Per staff message and POF I have scheduled appts.  JMW  

## 2012-11-12 NOTE — Telephone Encounter (Signed)
, °

## 2012-11-12 NOTE — Patient Instructions (Addendum)
Proceed with chemotherapy today  I will see you back on 11/24/12

## 2012-11-12 NOTE — Patient Instructions (Addendum)
[facility] Discharge Instructions for Patients Receiving Chemotherapy  Today you received the following chemotherapy agents Abraxane and Herceptin  To help prevent nausea and vomiting after your treatment, we encourage you to take your nausea medication as prescribed    If you develop nausea and vomiting that is not controlled by your nausea medication, call the clinic.   BELOW ARE SYMPTOMS THAT SHOULD BE REPORTED IMMEDIATELY:  *FEVER GREATER THAN 100.5 F  *CHILLS WITH OR WITHOUT FEVER  NAUSEA AND VOMITING THAT IS NOT CONTROLLED WITH YOUR NAUSEA MEDICATION  *UNUSUAL SHORTNESS OF BREATH  *UNUSUAL BRUISING OR BLEEDING  TENDERNESS IN MOUTH AND THROAT WITH OR WITHOUT PRESENCE OF ULCERS  *URINARY PROBLEMS  *BOWEL PROBLEMS  UNUSUAL RASH Items with * indicate a potential emergency and should be followed up as soon as possible.  Feel free to call the clinic you have any questions or concerns. The clinic phone number is [phone].    

## 2012-11-12 NOTE — Progress Notes (Signed)
OFFICE PROGRESS NOTE  CC  [name] Aliment, MD [address] [place] [address] Dr. 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 Patient is status post bilateral mastectomy with immediate reconstruction  STAGE:  Right breast (mastectomy)  T1cN0 1.2 cm ER-/PR-/her-/ki-67 92% 0/1 nodes positive  Left breast (mastectomy with snl) T1cN0 1.4 cm, 1.2 cm, 0.1c ER 100%, PR 11%, her2neu positive 6.50, Ki-67 80%   PRIOR THERAPY:  #1screening mammogram performed that showed 2 areas of concern on the left side.calcifications were noted in the left. In the right breast a possible mass warranted further evaluation.   #2 On 03/29/2012 patient underwent a bilateral diagnostic mammogram and right breast ultrasound. A spot magnification images demonstrate suspicious group of pleomorphic microcalcifications over the outer lower left breast with additional suspicious group of pleomorphic microcalcifications over the outer midportion of the left periareolar region. Spot compression images of the right breast demonstrate persistence of a 1 cm density at the edge of the film in the deep third of the right inner breast.   #3Ultrasound performed showed no focal abnormality over the entire in her right breast to correspond to the mammographic density. Patient was recommended stereotactic core needle biopsy of the 2 groups of suspicious left breast microcalcifications. Because patient and her husband wanted bilateral mastectomies MRI was not performed.on 04/15/2012 patient had needle core biopsy performed of the 2 areas in the left breast. The left needle core biopsy in the lower breast revealed ductal carcinoma in situ with associated comedo necrosis and calcifications with the in situ carcinoma. It was ER +100% PR +12%. 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 Tumor was ER +100% PR +11% proliferation marker Ki-67 elevated at 80% HER-2/neu showed amplification with a ratio of 6.50.   #4 patient is now status post bilateral mastectomies performed on 05/14/2012 with immediate reconstruction. The final pathology revealed bilateral breast cancers. In the right breast tissue is noted to have a 1.2 cm invasive ductal carcinoma high-grade ER negative PR negative HER-2/neu negative with Ki-67 92% one node was negative for metastatic disease but this was not the sentinel lymph node. On the left side the patient had multifocal disease measuring 1.4 cm, 1.2 cm, 0.1 cm. Tumor was ER +100% PR +11% HER-2/neu positive with a ratio 6.50. Ki-67 was 80% 8 sentinel nodes were negative for metastatic disease.  #5 patient has had immediate reconstruction with expanders bilaterally.  #6s/p adjuvant chemotherapy initially consisting of Adriamycin Cytoxan every 2 weeks for a total of 4 cycles. Given 07/02/12 - 08/12/12  #7This will then be followed by Taxol and Herceptin to be given weekly for 12 weeks time.beginning 08/27/12. This was discontinued due to neuropathy.  #8 Abraxane and Herceptin days 1, day 8, day 15 on a 28 day cycle. Beginning 09/03/2012   CURRENT THERAPY: Adjuvant Abraxane and Herceptin cycle 3 day 15  INTERVAL HISTORY: [name] [name] 62 y.o. female returns for followup visit. overall patient is doing well. She continues to tolerate the abraxane well.  She denies fevers, chills, nausea, vomiting, constipation, diarrhea.  The numbness is stable.  She does occasionally have numbness that resolves with elevating her legs and massaging her feet.   Remainder of the 10 point review of systems is negative.  MEDICAL HISTORY: Past Medical  History  Diagnosis Date  . Heart murmur   . Diabetes mellitus without complication   . Osteopenia   . Cancer     breast   . Bronchitis   . Pneumonia   . Hiatal hernia   . Sinus problem   . Diabetes mellitus   . Breast cancer 04/15/12    left-biopsy  . Breast cancer, right breast 05/14/12    right mastectomy    ALLERGIES:  is allergic to darvon and penicillins.  MEDICATIONS:  Current Outpatient Prescriptions  Medication Sig Dispense Refill  . Ascorbic Acid (VITAMIN C) 500 MG tablet Take 500 mg by mouth daily.        . Biotin 10 MG TABS Take by mouth. 4000 mcg daily      . calcitonin, salmon, (MIACALCIN/FORTICAL) 200 UNIT/ACT nasal spray Place 1 spray into the nose daily.      . Calcium Citrate (CITRACAL PO) Take by mouth.      . Cholecalciferol (VITAMIN D PO) Take 1 tablet by mouth daily.      . fluticasone (FLONASE) 50 MCG/ACT nasal spray Place 2 sprays into the nose daily.  16 g  2  . gabapentin (NEURONTIN) 100 MG capsule Take 1 capsule at bedtime  30 capsule  4  . KRILL OIL PO Take by mouth.      . lidocaine-prilocaine (EMLA) cream Apply topically as needed.  30 g  6  . Multiple Vitamin (MULTIVITAMIN PO) Take 1 tablet by mouth daily.       . pantoprazole (PROTONIX) 40 MG tablet Take 40 mg by mouth daily.        . Probiotic Product (PROBIOTIC DAILY PO) Take by mouth daily.      . rosuvastatin (CRESTOR) 5 MG tablet Take 5 mg by mouth every Monday, Wednesday, and Friday.      . sitaGLIPtin (JANUVIA) 100 MG tablet Take 100 mg by mouth daily.      Marland Kitchen UNABLE TO FIND Apply 1 Device topically once. Cranial prosthesis r/t chemotherapy treatment  1 Device  0   No current facility-administered medications for this visit.    SURGICAL HISTORY:  Past Surgical History  Procedure Laterality Date  . Cesarean section  1981, 1983  . Tummy tuck  1999  . Tonsillectomy    . Foot surgery    . Gastric bypass    . Wound debridement  01/14/2011    Procedure: DEBRIDEMENT ABDOMINAL WOUND;  Surgeon: Ernestene Mention, MD;  Location: [facility];  Service: General;  Laterality: N/A;  wound debridement and  debridement on the abdomen  . Cosmetic surgery    . Total mastectomy Bilateral 05/14/2012    Procedure: TOTAL MASTECTOMY;  Surgeon: Ernestene Mention, MD;  Location: [facility] OR;  Service: General;  Laterality: Bilateral;  . Axillary sentinel node biopsy Left 05/14/2012    Procedure: AXILLARY SENTINEL NODE BIOPSY;  Surgeon: Ernestene Mention, MD;  Location: [facility] OR;  Service: General;  Laterality: Left;  . Portacath placement N/A 05/14/2012    Procedure: INSERTION PORT-A-CATH;  Surgeon: Ernestene Mention, MD;  Location: [facility] OR;  Service: General;  Laterality: N/A;  . Breast reconstruction with placement of tissue expander and flex hd (acellular hydrated dermis) Bilateral 05/14/2012    Procedure: BREAST RECONSTRUCTION WITH PLACEMENT OF TISSUE EXPANDER AND FLEX HD (ACELLULAR HYDRATED DERMIS);  Surgeon: Etter Sjogren, MD;  Location: [facility] OR;  Service: Plastics;  Laterality: Bilateral;  . Bunionectomy      REVIEW OF SYSTEMS:  Pertinent items are noted in HPI.   HEALTH MAINTENANCE:   PHYSICAL EXAMINATION: Blood pressure 144/88, pulse 86, temperature 98.5 F (36.9 C), temperature source Oral, resp. rate 20, height 5\' 4"  (1.626 m), weight 190 lb (86.183 kg). Body mass index is 32.6 kg/(m^2). General: Patient is a well appearing female in no acute distress HEENT: PERRLA, sclerae anicteric no conjunctival pallor, MMM Neck: supple, no palpable adenopathy Lungs: clear to auscultation bilaterally, no wheezes, rhonchi, or rales Cardiovascular: regular rate rhythm, S1, S2, no murmurs, rubs or gallops Abdomen: Soft, non-tender, non-distended, normoactive bowel sounds, no HSM Extremities: warm and well perfused, no clubbing, cyanosis, or edema Skin: No rashes or lesions Neuro: Non-focal ECOG PERFORMANCE STATUS: 0 - Asymptomatic  LABORATORY DATA: Lab Results  Component Value Date   WBC 3.1* 11/12/2012   HGB 11.4* 11/12/2012   HCT 34.6* 11/12/2012   MCV 88.3 11/12/2012   PLT 291 11/12/2012      Chemistry       Component Value Date/Time   NA 144 11/05/2012 0829   NA 139 05/17/2012 0530   K 4.2 11/05/2012 0829   K 3.7 05/17/2012 0530   CL 104 07/30/2012 0955   CL 100 05/17/2012 0530   CO2 29 11/05/2012 0829   CO2 33* 05/17/2012 0530   BUN 6.6* 11/05/2012 0829   BUN 4* 05/17/2012 0530   CREATININE 0.7 11/05/2012 0829   CREATININE 0.56 05/17/2012 0530      Component Value Date/Time   CALCIUM 9.8 11/05/2012 0829   CALCIUM 8.7 05/17/2012 0530   ALKPHOS 93 11/05/2012 0829   ALKPHOS 103 05/10/2012 1440   AST 18 11/05/2012 0829   AST 25 05/10/2012 1440   ALT 14 11/05/2012 0829   ALT 17 05/10/2012 1440   BILITOT 0.36 11/05/2012 0829   BILITOT 0.4 05/10/2012 1440    ADDITIONAL INFORMATION: 6. PROGNOSTIC INDICATORS - ACIS Results: IMMUNOHISTOCHEMICAL AND MORPHOMETRIC ANALYSIS BY THE AUTOMATED CELLULAR IMAGING SYSTEM (ACIS) Estrogen Receptor: 0%, NEGATIVE Progesterone Receptor: 0%, NEGATIVE Proliferation Marker Ki67: 92% COMMENT: The negative hormone receptor study(ies) in this case have an internal positive control. REFERENCE RANGE ESTROGEN RECEPTOR NEGATIVE <1% POSITIVE =>1% PROGESTERONE RECEPTOR NEGATIVE <1% POSITIVE =>1% All controls stained appropriately Pecola Leisure MD Pathologist, Electronic Signature ( Signed 05/21/2012) 6. CHROMOGENIC IN-SITU HYBRIDIZATION Results: HER-2/NEU BY CISH - NO AMPLIFICATION OF HER-2 DETECTED. 1 of 6 Duplicate copy FINAL for Holstrom, [name] G [phone]) ADDITIONAL INFORMATION:(continued) RESULT RATIO OF HER2: CEP 17 SIGNALS 1.19 AVERAGE HER2 COPY NUMBER PER CELL 1.85 REFERENCE RANGE NEGATIVE HER2/Chr17 Ratio <2.0 and Average HER2 copy number <4.0 EQUIVOCAL HER2/Chr17 Ratio <2.0 and Average HER2 copy number 4.0 and <6.0 POSITIVE HER2/Chr17 Ratio >=2.0 and/or Average HER2 copy number >=6.0 Pecola Leisure MD Pathologist, Electronic Signature ( Signed 05/20/2012) FINAL DIAGNOSIS Diagnosis 1. Lymph node, sentinel, biopsy, Left axillary - THERE IS NO EVIDENCE  OF CARCINOMA IN 1 OF 1 LYMPH NODE (0/1). 2. Lymph node, sentinel, biopsy, Left axillary - THERE IS NO EVIDENCE OF CARCINOMA IN 1 OF 1 LYMPH NODE (0/1). 3. Lymph node, sentinel, biopsy, Left axillary - THERE IS NO EVIDENCE OF CARCINOMA IN 1 OF 1 LYMPH NODE (0/1). 4. Lymph node, sentinel, biopsy, Left axillary - THERE IS NO EVIDENCE OF CARCINOMA IN 1 OF 1 LYMPH NODE (0/1). 5. Lymph node, sentinel, biopsy, Left axillary - THERE IS NO EVIDENCE OF CARCINOMA IN 1 OF 1 LYMPH NODE (0/1). 6. Breast, simple mastectomy, Right - INVASIVE DUCTAL CARCINOMA, GRADE III/III, SPANNING 1.2 CM. - DUCTAL CARCINOMA IN SITU,  HIGH GRADE. - LYMPHOVASCULAR INVASION IS IDENTIFIED. - THERE IS NO EVIDENCE OF CARCINOMA IN 1 OF 1 LYMPH NODE (0/1). - SEE ONCOLOGY TABLE BELOW. 7. Breast, simple mastectomy, Left - INVASIVE DUCTAL CARCINOMA, AT LEAST THREE FOCI, GRADE II/III, SPANNING 1.4 CM, 1.2 CM, AND 0.1 CM. - DUCTAL CARCINOMA IN SITU, INTERMEDIATE GRADE. - LYMPHOVASCULAR INVASION IS IDENTIFIED. - DUCTAL CARCINOMA IN SITU IS FOCALLY 0.2 CM TO THE ANTERIOR/INFERIOR SOFT TISSUE RESECTION MARGIN. - LOBULAR NEOPLASIA (LOBULAR CARCINOMA IN SITU). - THERE IS NO EVIDENCE OF CARCINOMA IN 2 OF 2 LYMPH NODES (0/2). - SEE ONCOLOGY TABLE BELOW. 8. Lymph node, biopsy, Left axillary - THERE IS NO EVIDENCE OF CARCINOMA IN 1 OF 1 LYMPH NODE (0/1). Microscopic Comment 6. BREAST, INVASIVE TUMOR, WITH LYMPH NODE SAMPLING Specimen, including laterality: Right breast Procedure: Simple mastectomy Grade: 3 2 of 6 Duplicate copy FINAL for Joo, [name] G ([id number]) Microscopic Comment(continued) Tubule formation: 3 Nuclear pleomorphism: 3 Mitotic:2 Tumor size (gross measurement): 1.2 cm Margins: Invasive, distance to closest margin: 1.3 cm to the deep margin (gross measurement) In-situ, distance to closest margin: 1.3 cm to the deep margin (gross measurement) Lymphovascular invasion: Present Ductal carcinoma in situ:  Present Grade: High grade Extensive intraductal component: Not identified Lobular neoplasia: Not identified in specimen #6 Tumor focality: Unifocal Treatment effect: N/A Extent of tumor: Confined to breast parenchyma Lymph nodes: # examined: 1 Lymph nodes with metastasis: 0 Breast prognostic profile: Will be performed on the current case and the results reported separately. TNM: pT1c, pN0 7. BREAST, INVASIVE TUMOR, WITH LYMPH NODE SAMPLING Specimen, including laterality: Left breast Procedure: Simple mastectomy Grade: 2 Tubule formation: 3 Nuclear pleomorphism: 2 Mitotic:2 Tumor size (gross measurements) 1.4 cm, 1.2 cm, and 0.1 cm Margins: Invasive, distance to closest margin: 0.7 cm to the anterior/inferior soft tissue resection margin In-situ, distance to closest margin: Focally 0.2 cm to the anterior/inferior soft tissue resection margin (glass slide measurement) Lymphovascular invasion: Present Ductal carcinoma in situ: Present Grade: Intermediate grade Extensive intraductal component: Yes Lobular neoplasia: Yes (lobular carcinoma in situ) Tumor focality: Multiple foci Treatment effect: N/A Extent of tumor: Confined to breast parenchyma Lymph nodes: # examined: 8 Lymph nodes with metastasis: 0 Breast prognostic profile: [phone] Estrogen receptor: 100% strong staining intensity Progesterone receptor: 11% strong staining intensity Her 2 neu: Amplification was detected. The ratio was 6.50. 3 of 6 Duplicate copy FINAL for Aristizabal, [name] G ([id number]) Microscopic Comment(continued) Ki-67: 80% TNM: mpT1c, pN0 Comments: In addition to the two grossly identified nodules, there is invasive carcinoma present in random tissue submitted from the inferior lateral quadrant. The three foci of carcinoma are morphologically similar.   RADIOGRAPHIC STUDIES:  Nm Pet Image Initial (pi) Skull Base To Thigh  05/12/2012  *RADIOLOGY REPORT*  Clinical Data: Initial treatment  strategy for breast cancer.   NUCLEAR MEDICINE PET SKULL BASE TO THIGH  Fasting Blood Glucose:  143  Technique:  20 mCi F-18 FDG was injected intravenously. CT data was obtained and used for attenuation correction and anatomic localization only.  (This was not acquired as a diagnostic CT examination.) Additional exam technical data entered on technologist worksheet.  Comparison:  none  Findings:  Neck: No hypermetabolic lymph nodes in the neck.  Chest:  In the inferior medial aspect of the right breast there is a 10 mm nodule (image 99) with moderate  metabolic activity ( SUV max = 2.9).  No hypermetabolic mediastinal or hilar lymph nodes.  There is hypermetabolic activity in the thoracic inlet which is  felt to be vascular in nature.  There is activity adjacent to the thoracic aorta which is also felt to be vascular may relate to the hemiazygos vein.  Abdomen/Pelvis:  No abnormal hypermetabolic activity within the liver, pancreas, adrenal glands, or spleen.  No hypermetabolic lymph nodes in the abdomen or pelvis.  Skeleton:  No focal hypermetabolic activity to suggest skeletal metastasis.  IMPRESSION:  1.  No evidence of distant breast cancer metastasis. 2.  Hypermetabolic nodule within the inferior medial right breast   Original Report Authenticated By: Genevive Bi, M.D.    Nm Sentinel Node Inj-no Rpt (breast)  05/14/2012  CLINICAL DATA: cancer left breast   Sulfur colloid was injected intradermally by the nuclear medicine  technologist for breast cancer sentinel node localization.     Dg Chest Port 1 View  05/14/2012  *RADIOLOGY REPORT*  Clinical Data: Status post Port-A-Cath placement  PORTABLE CHEST - 1 VIEW  Comparison: None.  Findings: The cardiac shadow is within normal limits.  Bilateral tissue expanders as well as surgical drains are identified.  A right chest wall port is seen. The catheter tip is at the cavoatrial junction.  No pneumothorax is noted.  The lungs are clear bilaterally.   IMPRESSION: No evidence of pneumothorax following port placement.  Bilateral tissue expanders.   Original Report Authenticated By: Alcide Clever, M.D.    Dg Fluoro Guide Cv Line-no Report  05/14/2012  CLINICAL DATA: LEFT BREAST CANCER/PORT-A-CATH   FLOURO GUIDE CV LINE  Fluoroscopy was utilized by the requesting physician.  No radiographic  interpretation.      ASSESSMENT: 62 year old female with  #1 bilateral breast cancers on the right side she has triple-negative disease stage I left side stage I ER positive PR positive HER-2/neu positive disease. Patient is status post bilateral mastectomies. Of note on the right side the lymph node removed was not a sentinel lymph node. It is unclear what to do with this side in terms of whether or not to do further sentinel lymph node. Clinically patient seems to be doing well.  #2 we discussed her pathology in detail. We discussed chemotherapy to be given adjuvantly. We discussed Adriamycin Cytoxan given every 2 weeks for a total of 4 cycles followed by Taxol and Herceptin given weekly x12 weeks followed by Herceptin every 3 weeks to finish out a total of one year of therapy. We also discussed role of adjuvant hormonal therapy since the left side is ER positive.  #3 patient is now here (5/16) to begin cycle 1 day 1 of Adriamycin and Cytoxan to be given every 2 weeks for a total of 4 cycles. Patient is now status post 4 cycles of Adriamycin Cytoxan given from 07/02/2012 through 08/12/2012. Overall she tolerated the treatment well.  #4 patient will begin Taxol and Herceptin every week for a total of 12 weeks beginning 08/27/2012. Patient understands risks benefits and side effects. Status post one cycle on 08/27/2012 and then discontinued due to neuropathy.  #5 patient will switch to Abraxane and Herceptin 3 weeks on one week off to complete out 12 weeks of therapy.she began Abraxane and Herceptin starting on 09/03/2012. A total of 4 cycles is planned   PLAN:   #1 patient will proceed with day 15 cycle 3 of Abraxane and Herceptin today.  She is tolerating this well.  #2 echocardiogram shows an ejection fraction between 55 and 60%.  #3 patient's neuropathies are stable.  She will continue elevation and massage.    #4 she will return  in 2 week's time for cycle 4 day 1 of Abraxane and Herceptin.  All questions were answered. The patient knows to call the clinic with any problems, questions or concerns. We can certainly see the patient much sooner if necessary.  I spent 25 minutes counseling the patient face to face. The total time spent in the appointment was 30 minutes.  Drue Second, MD Medical/Oncology [facility] [phone] (beeper) [phone] (Office)

## 2012-11-15 ENCOUNTER — Other Ambulatory Visit: Payer: Self-pay | Admitting: Certified Registered Nurse Anesthetist

## 2012-11-19 ENCOUNTER — Ambulatory Visit: Payer: BC Managed Care – PPO

## 2012-11-24 ENCOUNTER — Other Ambulatory Visit (HOSPITAL_BASED_OUTPATIENT_CLINIC_OR_DEPARTMENT_OTHER): Payer: BC Managed Care – PPO | Admitting: Lab

## 2012-11-24 ENCOUNTER — Ambulatory Visit (HOSPITAL_BASED_OUTPATIENT_CLINIC_OR_DEPARTMENT_OTHER): Payer: BC Managed Care – PPO

## 2012-11-24 ENCOUNTER — Ambulatory Visit (HOSPITAL_BASED_OUTPATIENT_CLINIC_OR_DEPARTMENT_OTHER): Payer: BC Managed Care – PPO | Admitting: Oncology

## 2012-11-24 ENCOUNTER — Encounter: Payer: Self-pay | Admitting: Oncology

## 2012-11-24 VITALS — BP 144/81 | HR 95 | Temp 97.7°F | Resp 18 | Ht 64.0 in | Wt 193.7 lb

## 2012-11-24 DIAGNOSIS — C50919 Malignant neoplasm of unspecified site of unspecified female breast: Secondary | ICD-10-CM

## 2012-11-24 DIAGNOSIS — C50912 Malignant neoplasm of unspecified site of left female breast: Secondary | ICD-10-CM

## 2012-11-24 DIAGNOSIS — Z5111 Encounter for antineoplastic chemotherapy: Secondary | ICD-10-CM

## 2012-11-24 DIAGNOSIS — C50911 Malignant neoplasm of unspecified site of right female breast: Secondary | ICD-10-CM

## 2012-11-24 DIAGNOSIS — C50119 Malignant neoplasm of central portion of unspecified female breast: Secondary | ICD-10-CM

## 2012-11-24 DIAGNOSIS — G589 Mononeuropathy, unspecified: Secondary | ICD-10-CM

## 2012-11-24 DIAGNOSIS — Z170 Estrogen receptor positive status [ER+]: Secondary | ICD-10-CM

## 2012-11-24 LAB — COMPREHENSIVE METABOLIC PANEL (CC13)
ALT: 18 U/L (ref 0–55)
AST: 21 U/L (ref 5–34)
Albumin: 3.4 g/dL — ABNORMAL LOW (ref 3.5–5.0)
Alkaline Phosphatase: 87 U/L (ref 40–150)
Anion Gap: 10 mEq/L (ref 3–11)
BUN: 7.4 mg/dL (ref 7.0–26.0)
CO2: 28 mEq/L (ref 22–29)
Calcium: 9.6 mg/dL (ref 8.4–10.4)
Chloride: 107 mEq/L (ref 98–109)
Creatinine: 0.7 mg/dL (ref 0.6–1.1)
Glucose: 130 mg/dl (ref 70–140)
Potassium: 3.9 mEq/L (ref 3.5–5.1)
Sodium: 145 mEq/L (ref 136–145)
Total Bilirubin: 0.32 mg/dL (ref 0.20–1.20)
Total Protein: 6.3 g/dL — ABNORMAL LOW (ref 6.4–8.3)

## 2012-11-24 LAB — CBC WITH DIFFERENTIAL/PLATELET
BASO%: 1 % (ref 0.0–2.0)
Basophils Absolute: 0 10*3/uL (ref 0.0–0.1)
EOS%: 1.4 % (ref 0.0–7.0)
Eosinophils Absolute: 0 10*3/uL (ref 0.0–0.5)
HCT: 33.8 % — ABNORMAL LOW (ref 34.8–46.6)
HGB: 11.1 g/dL — ABNORMAL LOW (ref 11.6–15.9)
LYMPH%: 27.1 % (ref 14.0–49.7)
MCH: 28.6 pg (ref 25.1–34.0)
MCHC: 32.8 g/dL (ref 31.5–36.0)
MCV: 87.2 fL (ref 79.5–101.0)
MONO#: 0.4 10*3/uL (ref 0.1–0.9)
MONO%: 16.1 % — ABNORMAL HIGH (ref 0.0–14.0)
NEUT#: 1.5 10*3/uL (ref 1.5–6.5)
NEUT%: 54.4 % (ref 38.4–76.8)
Platelets: 316 10*3/uL (ref 145–400)
RBC: 3.88 10*6/uL (ref 3.70–5.45)
RDW: 16.9 % — ABNORMAL HIGH (ref 11.2–14.5)
WBC: 2.7 10*3/uL — ABNORMAL LOW (ref 3.9–10.3)
lymph#: 0.7 10*3/uL — ABNORMAL LOW (ref 0.9–3.3)

## 2012-11-24 MED ORDER — DEXAMETHASONE SODIUM PHOSPHATE 10 MG/ML IJ SOLN
10.0000 mg | Freq: Once | INTRAMUSCULAR | Status: AC
  Administered 2012-11-24: 10 mg via INTRAVENOUS

## 2012-11-24 MED ORDER — ONDANSETRON 8 MG/NS 50 ML IVPB
INTRAVENOUS | Status: AC
  Filled 2012-11-24: qty 8

## 2012-11-24 MED ORDER — HEPARIN SOD (PORK) LOCK FLUSH 100 UNIT/ML IV SOLN
500.0000 [IU] | Freq: Once | INTRAVENOUS | Status: AC | PRN
  Administered 2012-11-24: 500 [IU]
  Filled 2012-11-24: qty 5

## 2012-11-24 MED ORDER — SODIUM CHLORIDE 0.9 % IV SOLN
Freq: Once | INTRAVENOUS | Status: AC
  Administered 2012-11-24: 10:00:00 via INTRAVENOUS

## 2012-11-24 MED ORDER — ACETAMINOPHEN 325 MG PO TABS
ORAL_TABLET | ORAL | Status: AC
  Filled 2012-11-24: qty 1

## 2012-11-24 MED ORDER — ACETAMINOPHEN 325 MG PO TABS
650.0000 mg | ORAL_TABLET | Freq: Once | ORAL | Status: AC
  Administered 2012-11-24: 650 mg via ORAL

## 2012-11-24 MED ORDER — ACETAMINOPHEN 325 MG PO TABS
ORAL_TABLET | ORAL | Status: AC
  Filled 2012-11-24: qty 2

## 2012-11-24 MED ORDER — PACLITAXEL PROTEIN-BOUND CHEMO INJECTION 100 MG
100.0000 mg/m2 | Freq: Once | INTRAVENOUS | Status: AC
  Administered 2012-11-24: 200 mg via INTRAVENOUS
  Filled 2012-11-24: qty 40

## 2012-11-24 MED ORDER — DIPHENHYDRAMINE HCL 25 MG PO CAPS
50.0000 mg | ORAL_CAPSULE | Freq: Once | ORAL | Status: AC
  Administered 2012-11-24: 50 mg via ORAL

## 2012-11-24 MED ORDER — DIPHENHYDRAMINE HCL 25 MG PO CAPS
ORAL_CAPSULE | ORAL | Status: AC
  Filled 2012-11-24: qty 2

## 2012-11-24 MED ORDER — DEXAMETHASONE SODIUM PHOSPHATE 10 MG/ML IJ SOLN
INTRAMUSCULAR | Status: AC
  Filled 2012-11-24: qty 1

## 2012-11-24 MED ORDER — SODIUM CHLORIDE 0.9 % IJ SOLN
10.0000 mL | INTRAMUSCULAR | Status: DC | PRN
Start: 2012-11-24 — End: 2012-11-24
  Administered 2012-11-24: 10 mL
  Filled 2012-11-24: qty 10

## 2012-11-24 MED ORDER — ONDANSETRON 8 MG/50ML IVPB (CHCC)
8.0000 mg | Freq: Once | INTRAVENOUS | Status: AC
  Administered 2012-11-24: 8 mg via INTRAVENOUS

## 2012-11-24 MED ORDER — TRASTUZUMAB CHEMO INJECTION 440 MG
2.0000 mg/kg | Freq: Once | INTRAVENOUS | Status: AC
  Administered 2012-11-24: 168 mg via INTRAVENOUS
  Filled 2012-11-24: qty 8

## 2012-11-24 NOTE — Progress Notes (Signed)
OFFICE PROGRESS NOTE  CC  [name], MD [address] [place] [address] Dr. 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 Patient is status post bilateral mastectomy with immediate reconstruction  STAGE:  Right breast (mastectomy)  T1cN0 1.2 cm ER-/PR-/her-/ki-67 92% 0/1 nodes positive  Left breast (mastectomy with snl) T1cN0 1.4 cm, 1.2 cm, 0.1c ER 100%, PR 11%, her2neu positive 6.50, Ki-67 80%   PRIOR THERAPY:  #1screening mammogram performed that showed 2 areas of concern on the left side.calcifications were noted in the left. In the right breast a possible mass warranted further evaluation.   #2 On 03/29/2012 patient underwent a bilateral diagnostic mammogram and right breast ultrasound. A spot magnification images demonstrate suspicious group of pleomorphic microcalcifications over the outer lower left breast with additional suspicious group of pleomorphic microcalcifications over the outer midportion of the left periareolar region. Spot compression images of the right breast demonstrate persistence of a 1 cm density at the edge of the film in the deep third of the right inner breast.   #3Ultrasound performed showed no focal abnormality over the entire in her right breast to correspond to the mammographic density. Patient was recommended stereotactic core needle biopsy of the 2 groups of suspicious left breast microcalcifications. Because patient and her husband wanted bilateral mastectomies MRI was not performed.on 04/15/2012 patient had needle core biopsy performed of the 2 areas in the left breast. The left needle core biopsy in the lower breast revealed ductal carcinoma in situ with associated comedo necrosis and calcifications with the in situ carcinoma. It was ER +100% PR +12%. 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 Tumor was ER +100% PR +11% proliferation marker Ki-67 elevated at 80% HER-2/neu showed amplification with a ratio of 6.50.   #4 patient is now status post bilateral mastectomies performed on 05/14/2012 with immediate reconstruction. The final pathology revealed bilateral breast cancers. In the right breast tissue is noted to have a 1.2 cm invasive ductal carcinoma high-grade ER negative PR negative HER-2/neu negative with Ki-67 92% one node was negative for metastatic disease but this was not the sentinel lymph node. On the left side the patient had multifocal disease measuring 1.4 cm, 1.2 cm, 0.1 cm. Tumor was ER +100% PR +11% HER-2/neu positive with a ratio 6.50. Ki-67 was 80% 8 sentinel nodes were negative for metastatic disease.  #5 patient has had immediate reconstruction with expanders bilaterally.  #6s/p adjuvant chemotherapy initially consisting of Adriamycin Cytoxan every 2 weeks for a total of 4 cycles. Given 07/02/12 - 08/12/12  #7This will then be followed by Taxol and Herceptin to be given weekly for 12 weeks time.beginning 08/27/12. This was discontinued due to neuropathy.  #8 Abraxane and Herceptin days 1, day 8, day 15 on a 28 day cycle. Beginning 09/03/2012   CURRENT THERAPY: Adjuvant Abraxane and Herceptin cycle 4 day 1  INTERVAL HISTORY: [name] [name] 62 y.o. female returns for followup visit. overall patient is doing well. She continues to tolerate the abraxane well.  She denies fevers, chills, nausea, vomiting, constipation, diarrhea.  The numbness is stable.  She does occasionally have numbness that resolves with elevating her legs and massaging her feet.   Remainder of the 10 point review of systems is negative.  MEDICAL HISTORY: Past Medical  History  Diagnosis Date  . Heart murmur   . Diabetes mellitus without complication   . Osteopenia   . Cancer     breast  .  Bronchitis   . Pneumonia   . Hiatal hernia   . Sinus problem   . Diabetes mellitus   . Breast cancer 04/15/12    left-biopsy  . Breast cancer, right breast 05/14/12    right mastectomy    ALLERGIES:  is allergic to darvon and penicillins.  MEDICATIONS:  Current Outpatient Prescriptions  Medication Sig Dispense Refill  . Ascorbic Acid (VITAMIN C) 500 MG tablet Take 500 mg by mouth daily.        . Biotin 10 MG TABS Take by mouth. 4000 mcg daily      . calcitonin, salmon, (MIACALCIN/FORTICAL) 200 UNIT/ACT nasal spray Place 1 spray into the nose daily.      . Calcium Citrate (CITRACAL PO) Take by mouth.      . Cholecalciferol (VITAMIN D PO) Take 1 tablet by mouth daily.      . fluticasone (FLONASE) 50 MCG/ACT nasal spray Place 2 sprays into the nose daily.  16 g  2  . gabapentin (NEURONTIN) 100 MG capsule Take 1 capsule at bedtime  30 capsule  4  . KRILL OIL PO Take by mouth.      . lidocaine-prilocaine (EMLA) cream Apply topically as needed.  30 g  6  . Multiple Vitamin (MULTIVITAMIN PO) Take 1 tablet by mouth daily.       . pantoprazole (PROTONIX) 40 MG tablet Take 40 mg by mouth daily.        . Probiotic Product (PROBIOTIC DAILY PO) Take by mouth daily.      . rosuvastatin (CRESTOR) 5 MG tablet Take 5 mg by mouth every Monday, Wednesday, and Friday.      . sitaGLIPtin (JANUVIA) 100 MG tablet Take 100 mg by mouth daily.      Marland Kitchen UNABLE TO FIND Apply 1 Device topically once. Cranial prosthesis r/t chemotherapy treatment  1 Device  0   No current facility-administered medications for this visit.    SURGICAL HISTORY:  Past Surgical History  Procedure Laterality Date  . Cesarean section  1981, 1983  . Tummy tuck  1999  . Tonsillectomy    . Foot surgery    . Gastric bypass    . Wound debridement  01/14/2011    Procedure: DEBRIDEMENT ABDOMINAL WOUND;  Surgeon: Ernestene Mention, MD;  Location: [facility];  Service: General;  Laterality: N/A;  wound debridement and  debridement on the abdomen  . Cosmetic surgery    . Total mastectomy Bilateral 05/14/2012    Procedure: TOTAL MASTECTOMY;  Surgeon: Ernestene Mention, MD;  Location: [facility] OR;  Service: General;  Laterality: Bilateral;  . Axillary sentinel node biopsy Left 05/14/2012    Procedure: AXILLARY SENTINEL NODE BIOPSY;  Surgeon: Ernestene Mention, MD;  Location: [facility] OR;  Service: General;  Laterality: Left;  . Portacath placement N/A 05/14/2012    Procedure: INSERTION PORT-A-CATH;  Surgeon: Ernestene Mention, MD;  Location: [facility] OR;  Service: General;  Laterality: N/A;  . Breast reconstruction with placement of tissue expander and flex hd (acellular hydrated dermis) Bilateral 05/14/2012    Procedure: BREAST RECONSTRUCTION WITH PLACEMENT OF TISSUE EXPANDER AND FLEX HD (ACELLULAR HYDRATED DERMIS);  Surgeon: Etter Sjogren, MD;  Location: [facility] OR;  Service: Plastics;  Laterality: Bilateral;  . Bunionectomy      REVIEW OF SYSTEMS:  Pertinent items are noted in HPI.   HEALTH MAINTENANCE:   PHYSICAL EXAMINATION: Blood pressure 144/81, pulse 95, temperature 97.7 F (36.5 C), temperature source Oral, resp. rate 18, height 5\' 4"  (1.626 m), weight 193 lb 11.2 oz (87.862 kg). Body mass index is 33.23 kg/(m^2). General: Patient is a well appearing female in no acute distress HEENT: PERRLA, sclerae anicteric no conjunctival pallor, MMM Neck: supple, no palpable adenopathy Lungs: clear to auscultation bilaterally, no wheezes, rhonchi, or rales Cardiovascular: regular rate rhythm, S1, S2, no murmurs, rubs or gallops Abdomen: Soft, non-tender, non-distended, normoactive bowel sounds, no HSM Extremities: warm and well perfused, no clubbing, cyanosis, or edema Skin: No rashes or lesions Neuro: Non-focal ECOG PERFORMANCE STATUS: 0 - Asymptomatic  LABORATORY DATA: Lab Results  Component Value Date   WBC 2.7* 11/24/2012   HGB 11.1* 11/24/2012   HCT 33.8* 11/24/2012   MCV 87.2 11/24/2012   PLT 316 11/24/2012      Chemistry       Component Value Date/Time   NA 145 11/24/2012 0814   NA 139 05/17/2012 0530   K 3.9 11/24/2012 0814   K 3.7 05/17/2012 0530   CL 104 07/30/2012 0955   CL 100 05/17/2012 0530   CO2 28 11/24/2012 0814   CO2 33* 05/17/2012 0530   BUN 7.4 11/24/2012 0814   BUN 4* 05/17/2012 0530   CREATININE 0.7 11/24/2012 0814   CREATININE 0.56 05/17/2012 0530      Component Value Date/Time   CALCIUM 9.6 11/24/2012 0814   CALCIUM 8.7 05/17/2012 0530   ALKPHOS 87 11/24/2012 0814   ALKPHOS 103 05/10/2012 1440   AST 21 11/24/2012 0814   AST 25 05/10/2012 1440   ALT 18 11/24/2012 0814   ALT 17 05/10/2012 1440   BILITOT 0.32 11/24/2012 0814   BILITOT 0.4 05/10/2012 1440    ADDITIONAL INFORMATION: 6. PROGNOSTIC INDICATORS - ACIS Results: IMMUNOHISTOCHEMICAL AND MORPHOMETRIC ANALYSIS BY THE AUTOMATED CELLULAR IMAGING SYSTEM (ACIS) Estrogen Receptor: 0%, NEGATIVE Progesterone Receptor: 0%, NEGATIVE Proliferation Marker Ki67: 92% COMMENT: The negative hormone receptor study(ies) in this case have an internal positive control. REFERENCE RANGE ESTROGEN RECEPTOR NEGATIVE <1% POSITIVE =>1% PROGESTERONE RECEPTOR NEGATIVE <1% POSITIVE =>1% All controls stained appropriately Pecola Leisure MD Pathologist, Electronic Signature ( Signed 05/21/2012) 6. CHROMOGENIC IN-SITU HYBRIDIZATION Results: HER-2/NEU BY CISH - NO AMPLIFICATION OF HER-2 DETECTED. 1 of 6 Duplicate copy FINAL for Willers, [name] G [phone]) ADDITIONAL INFORMATION:(continued) RESULT RATIO OF HER2: CEP 17 SIGNALS 1.19 AVERAGE HER2 COPY NUMBER PER CELL 1.85 REFERENCE RANGE NEGATIVE HER2/Chr17 Ratio <2.0 and Average HER2 copy number <4.0 EQUIVOCAL HER2/Chr17 Ratio <2.0 and Average HER2 copy number 4.0 and <6.0 POSITIVE HER2/Chr17 Ratio >=2.0 and/or Average HER2 copy number >=6.0 Pecola Leisure MD Pathologist, Electronic Signature ( Signed 05/20/2012) FINAL DIAGNOSIS Diagnosis 1. Lymph node, sentinel, biopsy, Left axillary - THERE IS NO  EVIDENCE OF CARCINOMA IN 1 OF 1 LYMPH NODE (0/1). 2. Lymph node, sentinel, biopsy, Left axillary - THERE IS NO EVIDENCE OF CARCINOMA IN 1 OF 1 LYMPH NODE (0/1). 3. Lymph node, sentinel, biopsy, Left axillary - THERE IS NO EVIDENCE OF CARCINOMA IN 1 OF 1 LYMPH NODE (0/1). 4. Lymph node, sentinel, biopsy, Left axillary - THERE IS NO EVIDENCE OF CARCINOMA IN 1 OF 1 LYMPH NODE (0/1). 5. Lymph node, sentinel, biopsy, Left axillary - THERE IS NO EVIDENCE OF CARCINOMA IN 1 OF 1 LYMPH NODE (0/1). 6. Breast, simple mastectomy, Right - INVASIVE DUCTAL CARCINOMA, GRADE III/III, SPANNING 1.2 CM. - DUCTAL CARCINOMA  IN SITU, HIGH GRADE. - LYMPHOVASCULAR INVASION IS IDENTIFIED. - THERE IS NO EVIDENCE OF CARCINOMA IN 1 OF 1 LYMPH NODE (0/1). - SEE ONCOLOGY TABLE BELOW. 7. Breast, simple mastectomy, Left - INVASIVE DUCTAL CARCINOMA, AT LEAST THREE FOCI, GRADE II/III, SPANNING 1.4 CM, 1.2 CM, AND 0.1 CM. - DUCTAL CARCINOMA IN SITU, INTERMEDIATE GRADE. - LYMPHOVASCULAR INVASION IS IDENTIFIED. - DUCTAL CARCINOMA IN SITU IS FOCALLY 0.2 CM TO THE ANTERIOR/INFERIOR SOFT TISSUE RESECTION MARGIN. - LOBULAR NEOPLASIA (LOBULAR CARCINOMA IN SITU). - THERE IS NO EVIDENCE OF CARCINOMA IN 2 OF 2 LYMPH NODES (0/2). - SEE ONCOLOGY TABLE BELOW. 8. Lymph node, biopsy, Left axillary - THERE IS NO EVIDENCE OF CARCINOMA IN 1 OF 1 LYMPH NODE (0/1). Microscopic Comment 6. BREAST, INVASIVE TUMOR, WITH LYMPH NODE SAMPLING Specimen, including laterality: Right breast Procedure: Simple mastectomy Grade: 3 2 of 6 Duplicate copy FINAL for Liberatore, [name] G ([id number]) Microscopic Comment(continued) Tubule formation: 3 Nuclear pleomorphism: 3 Mitotic:2 Tumor size (gross measurement): 1.2 cm Margins: Invasive, distance to closest margin: 1.3 cm to the deep margin (gross measurement) In-situ, distance to closest margin: 1.3 cm to the deep margin (gross measurement) Lymphovascular invasion: Present Ductal carcinoma in  situ: Present Grade: High grade Extensive intraductal component: Not identified Lobular neoplasia: Not identified in specimen #6 Tumor focality: Unifocal Treatment effect: N/A Extent of tumor: Confined to breast parenchyma Lymph nodes: # examined: 1 Lymph nodes with metastasis: 0 Breast prognostic profile: Will be performed on the current case and the results reported separately. TNM: pT1c, pN0 7. BREAST, INVASIVE TUMOR, WITH LYMPH NODE SAMPLING Specimen, including laterality: Left breast Procedure: Simple mastectomy Grade: 2 Tubule formation: 3 Nuclear pleomorphism: 2 Mitotic:2 Tumor size (gross measurements) 1.4 cm, 1.2 cm, and 0.1 cm Margins: Invasive, distance to closest margin: 0.7 cm to the anterior/inferior soft tissue resection margin In-situ, distance to closest margin: Focally 0.2 cm to the anterior/inferior soft tissue resection margin (glass slide measurement) Lymphovascular invasion: Present Ductal carcinoma in situ: Present Grade: Intermediate grade Extensive intraductal component: Yes Lobular neoplasia: Yes (lobular carcinoma in situ) Tumor focality: Multiple foci Treatment effect: N/A Extent of tumor: Confined to breast parenchyma Lymph nodes: # examined: 8 Lymph nodes with metastasis: 0 Breast prognostic profile: [phone] Estrogen receptor: 100% strong staining intensity Progesterone receptor: 11% strong staining intensity Her 2 neu: Amplification was detected. The ratio was 6.50. 3 of 6 Duplicate copy FINAL for Ridgley, [name] G ([id number]) Microscopic Comment(continued) Ki-67: 80% TNM: mpT1c, pN0 Comments: In addition to the two grossly identified nodules, there is invasive carcinoma present in random tissue submitted from the inferior lateral quadrant. The three foci of carcinoma are morphologically similar.   RADIOGRAPHIC STUDIES:  Nm Pet Image Initial (pi) Skull Base To Thigh  05/12/2012  *RADIOLOGY REPORT*  Clinical Data: Initial  treatment strategy for breast cancer.   NUCLEAR MEDICINE PET SKULL BASE TO THIGH  Fasting Blood Glucose:  143  Technique:  20 mCi F-18 FDG was injected intravenously. CT data was obtained and used for attenuation correction and anatomic localization only.  (This was not acquired as a diagnostic CT examination.) Additional exam technical data entered on technologist worksheet.  Comparison:  none  Findings:  Neck: No hypermetabolic lymph nodes in the neck.  Chest:  In the inferior medial aspect of the right breast there is a 10 mm nodule (image 99) with moderate  metabolic activity ( SUV max = 2.9).  No hypermetabolic mediastinal or hilar lymph nodes.  There is hypermetabolic activity in the thoracic inlet  which is felt to be vascular in nature.  There is activity adjacent to the thoracic aorta which is also felt to be vascular may relate to the hemiazygos vein.  Abdomen/Pelvis:  No abnormal hypermetabolic activity within the liver, pancreas, adrenal glands, or spleen.  No hypermetabolic lymph nodes in the abdomen or pelvis.  Skeleton:  No focal hypermetabolic activity to suggest skeletal metastasis.  IMPRESSION:  1.  No evidence of distant breast cancer metastasis. 2.  Hypermetabolic nodule within the inferior medial right breast   Original Report Authenticated By: Genevive Bi, M.D.    Nm Sentinel Node Inj-no Rpt (breast)  05/14/2012  CLINICAL DATA: cancer left breast   Sulfur colloid was injected intradermally by the nuclear medicine  technologist for breast cancer sentinel node localization.     Dg Chest Port 1 View  05/14/2012  *RADIOLOGY REPORT*  Clinical Data: Status post Port-A-Cath placement  PORTABLE CHEST - 1 VIEW  Comparison: None.  Findings: The cardiac shadow is within normal limits.  Bilateral tissue expanders as well as surgical drains are identified.  A right chest wall port is seen. The catheter tip is at the cavoatrial junction.  No pneumothorax is noted.  The lungs are clear bilaterally.   IMPRESSION: No evidence of pneumothorax following port placement.  Bilateral tissue expanders.   Original Report Authenticated By: Alcide Clever, M.D.    Dg Fluoro Guide Cv Line-no Report  05/14/2012  CLINICAL DATA: LEFT BREAST CANCER/PORT-A-CATH   FLOURO GUIDE CV LINE  Fluoroscopy was utilized by the requesting physician.  No radiographic  interpretation.      ASSESSMENT: 62 year old female with  #1 bilateral breast cancers on the right side she has triple-negative disease stage I left side stage I ER positive PR positive HER-2/neu positive disease. Patient is status post bilateral mastectomies. Of note on the right side the lymph node removed was not a sentinel lymph node. It is unclear what to do with this side in terms of whether or not to do further sentinel lymph node. Clinically patient seems to be doing well.  #2 we discussed her pathology in detail. We discussed chemotherapy to be given adjuvantly. We discussed Adriamycin Cytoxan given every 2 weeks for a total of 4 cycles followed by Taxol and Herceptin given weekly x12 weeks followed by Herceptin every 3 weeks to finish out a total of one year of therapy. We also discussed role of adjuvant hormonal therapy since the left side is ER positive.  #3 patient is now here (5/16) to begin cycle 1 day 1 of Adriamycin and Cytoxan to be given every 2 weeks for a total of 4 cycles. Patient is now status post 4 cycles of Adriamycin Cytoxan given from 07/02/2012 through 08/12/2012. Overall she tolerated the treatment well.  #4 patient will begin Taxol and Herceptin every week for a total of 12 weeks beginning 08/27/2012. Patient understands risks benefits and side effects. Status post one cycle on 08/27/2012 and then discontinued due to neuropathy.  #5 patient will switch to Abraxane and Herceptin 3 weeks on one week off to complete out 12 weeks of therapy.she began Abraxane and Herceptin starting on 09/03/2012. A total of 4 cycles is planned   PLAN:   #1 patient will proceed with day 1 cycle 4 of Abraxane and Herceptin today.  She is tolerating this well.  #2 echocardiogram shows an ejection fraction between 55 and 60%.  #3 patient's neuropathies are stable.  She will continue elevation and massage.    #4 she  will return in 2 week's time for cycle 4 day 8 of Abraxane and Herceptin.  All questions were answered. The patient knows to call the clinic with any problems, questions or concerns. We can certainly see the patient much sooner if necessary.  I spent 25 minutes counseling the patient face to face. The total time spent in the appointment was 30 minutes.  Drue Second, MD Medical/Oncology [facility] [phone] (beeper) [phone] (Office)

## 2012-11-25 ENCOUNTER — Telehealth: Payer: Self-pay | Admitting: Genetic Counselor

## 2012-11-25 NOTE — Telephone Encounter (Signed)
Left good news message on machine. 

## 2012-11-26 ENCOUNTER — Telehealth: Payer: Self-pay | Admitting: Oncology

## 2012-11-26 ENCOUNTER — Other Ambulatory Visit: Payer: BC Managed Care – PPO | Admitting: Lab

## 2012-11-26 ENCOUNTER — Ambulatory Visit: Payer: BC Managed Care – PPO | Admitting: Oncology

## 2012-11-26 ENCOUNTER — Ambulatory Visit: Payer: BC Managed Care – PPO

## 2012-11-26 ENCOUNTER — Telehealth: Payer: Self-pay | Admitting: *Deleted

## 2012-11-26 NOTE — Telephone Encounter (Signed)
, °

## 2012-11-26 NOTE — Telephone Encounter (Signed)
Per staff message and POF I have scheduled appts.  JMW  

## 2012-12-01 ENCOUNTER — Telehealth: Payer: Self-pay | Admitting: Genetic Counselor

## 2012-12-01 ENCOUNTER — Encounter: Payer: Self-pay | Admitting: Genetic Counselor

## 2012-12-01 NOTE — Telephone Encounter (Signed)
Revealed negative genetic test results, but with an MSH6 VUS.  No family history of colon or uterine cancer.

## 2012-12-03 ENCOUNTER — Ambulatory Visit: Payer: BC Managed Care – PPO | Admitting: Oncology

## 2012-12-03 ENCOUNTER — Other Ambulatory Visit (HOSPITAL_BASED_OUTPATIENT_CLINIC_OR_DEPARTMENT_OTHER): Payer: BC Managed Care – PPO | Admitting: Lab

## 2012-12-03 ENCOUNTER — Encounter: Payer: Self-pay | Admitting: Oncology

## 2012-12-03 ENCOUNTER — Ambulatory Visit (HOSPITAL_BASED_OUTPATIENT_CLINIC_OR_DEPARTMENT_OTHER): Payer: BC Managed Care – PPO

## 2012-12-03 ENCOUNTER — Other Ambulatory Visit: Payer: BC Managed Care – PPO | Admitting: Lab

## 2012-12-03 ENCOUNTER — Telehealth: Payer: Self-pay | Admitting: Oncology

## 2012-12-03 ENCOUNTER — Ambulatory Visit (HOSPITAL_BASED_OUTPATIENT_CLINIC_OR_DEPARTMENT_OTHER): Payer: BC Managed Care – PPO | Admitting: Oncology

## 2012-12-03 ENCOUNTER — Telehealth: Payer: Self-pay | Admitting: *Deleted

## 2012-12-03 VITALS — BP 152/86 | HR 88 | Temp 98.2°F | Resp 18 | Ht 64.0 in | Wt 195.2 lb

## 2012-12-03 DIAGNOSIS — Z170 Estrogen receptor positive status [ER+]: Secondary | ICD-10-CM

## 2012-12-03 DIAGNOSIS — Z9010 Acquired absence of unspecified breast and nipple: Secondary | ICD-10-CM

## 2012-12-03 DIAGNOSIS — C50112 Malignant neoplasm of central portion of left female breast: Secondary | ICD-10-CM

## 2012-12-03 DIAGNOSIS — C50919 Malignant neoplasm of unspecified site of unspecified female breast: Secondary | ICD-10-CM

## 2012-12-03 DIAGNOSIS — C50912 Malignant neoplasm of unspecified site of left female breast: Secondary | ICD-10-CM

## 2012-12-03 DIAGNOSIS — C50911 Malignant neoplasm of unspecified site of right female breast: Secondary | ICD-10-CM

## 2012-12-03 DIAGNOSIS — Z171 Estrogen receptor negative status [ER-]: Secondary | ICD-10-CM

## 2012-12-03 DIAGNOSIS — C50119 Malignant neoplasm of central portion of unspecified female breast: Secondary | ICD-10-CM

## 2012-12-03 DIAGNOSIS — Z5111 Encounter for antineoplastic chemotherapy: Secondary | ICD-10-CM

## 2012-12-03 LAB — COMPREHENSIVE METABOLIC PANEL (CC13)
ALT: 15 U/L (ref 0–55)
AST: 17 U/L (ref 5–34)
Albumin: 3.5 g/dL (ref 3.5–5.0)
Alkaline Phosphatase: 85 U/L (ref 40–150)
Anion Gap: 10 meq/L (ref 3–11)
BUN: 8.3 mg/dL (ref 7.0–26.0)
CO2: 29 meq/L (ref 22–29)
Calcium: 10 mg/dL (ref 8.4–10.4)
Chloride: 106 meq/L (ref 98–109)
Creatinine: 0.7 mg/dL (ref 0.6–1.1)
Glucose: 134 mg/dL (ref 70–140)
Potassium: 4.5 meq/L (ref 3.5–5.1)
Sodium: 145 meq/L (ref 136–145)
Total Bilirubin: 0.29 mg/dL (ref 0.20–1.20)
Total Protein: 6.6 g/dL (ref 6.4–8.3)

## 2012-12-03 LAB — CBC WITH DIFFERENTIAL/PLATELET
BASO%: 0.9 % (ref 0.0–2.0)
Basophils Absolute: 0 10*3/uL (ref 0.0–0.1)
EOS%: 1.7 % (ref 0.0–7.0)
Eosinophils Absolute: 0.1 10*3/uL (ref 0.0–0.5)
HCT: 34.2 % — ABNORMAL LOW (ref 34.8–46.6)
HGB: 11.3 g/dL — ABNORMAL LOW (ref 11.6–15.9)
LYMPH%: 22.9 % (ref 14.0–49.7)
MCH: 28.5 pg (ref 25.1–34.0)
MCHC: 33.1 g/dL (ref 31.5–36.0)
MCV: 86.1 fL (ref 79.5–101.0)
MONO#: 0.5 10*3/uL (ref 0.1–0.9)
MONO%: 11.2 % (ref 0.0–14.0)
NEUT#: 2.7 10*3/uL (ref 1.5–6.5)
NEUT%: 63.3 % (ref 38.4–76.8)
Platelets: 320 10*3/uL (ref 145–400)
RBC: 3.97 10*6/uL (ref 3.70–5.45)
RDW: 17.5 % — ABNORMAL HIGH (ref 11.2–14.5)
WBC: 4.3 10*3/uL (ref 3.9–10.3)
lymph#: 1 10*3/uL (ref 0.9–3.3)

## 2012-12-03 MED ORDER — DIPHENHYDRAMINE HCL 25 MG PO CAPS
50.0000 mg | ORAL_CAPSULE | Freq: Once | ORAL | Status: AC
  Administered 2012-12-03: 50 mg via ORAL

## 2012-12-03 MED ORDER — ONDANSETRON 8 MG/50ML IVPB (CHCC)
8.0000 mg | Freq: Once | INTRAVENOUS | Status: AC
  Administered 2012-12-03: 8 mg via INTRAVENOUS

## 2012-12-03 MED ORDER — DEXAMETHASONE SODIUM PHOSPHATE 10 MG/ML IJ SOLN
10.0000 mg | Freq: Once | INTRAMUSCULAR | Status: AC
  Administered 2012-12-03: 10 mg via INTRAVENOUS

## 2012-12-03 MED ORDER — ONDANSETRON 8 MG/NS 50 ML IVPB
INTRAVENOUS | Status: AC
  Filled 2012-12-03: qty 8

## 2012-12-03 MED ORDER — DIPHENHYDRAMINE HCL 25 MG PO CAPS
ORAL_CAPSULE | ORAL | Status: AC
  Filled 2012-12-03: qty 2

## 2012-12-03 MED ORDER — ACETAMINOPHEN 325 MG PO TABS
ORAL_TABLET | ORAL | Status: AC
  Filled 2012-12-03: qty 2

## 2012-12-03 MED ORDER — DEXAMETHASONE SODIUM PHOSPHATE 10 MG/ML IJ SOLN
INTRAMUSCULAR | Status: AC
  Filled 2012-12-03: qty 1

## 2012-12-03 MED ORDER — SODIUM CHLORIDE 0.9 % IV SOLN
Freq: Once | INTRAVENOUS | Status: AC
  Administered 2012-12-03: 10:00:00 via INTRAVENOUS

## 2012-12-03 MED ORDER — SODIUM CHLORIDE 0.9 % IJ SOLN
10.0000 mL | INTRAMUSCULAR | Status: DC | PRN
  Administered 2012-12-03: 10 mL
  Filled 2012-12-03: qty 10

## 2012-12-03 MED ORDER — HEPARIN SOD (PORK) LOCK FLUSH 100 UNIT/ML IV SOLN
500.0000 [IU] | Freq: Once | INTRAVENOUS | Status: AC | PRN
  Administered 2012-12-03: 500 [IU]
  Filled 2012-12-03: qty 5

## 2012-12-03 MED ORDER — PACLITAXEL PROTEIN-BOUND CHEMO INJECTION 100 MG
100.0000 mg/m2 | Freq: Once | INTRAVENOUS | Status: AC
  Administered 2012-12-03: 200 mg via INTRAVENOUS
  Filled 2012-12-03: qty 40

## 2012-12-03 MED ORDER — ACETAMINOPHEN 325 MG PO TABS
650.0000 mg | ORAL_TABLET | Freq: Once | ORAL | Status: AC
  Administered 2012-12-03: 650 mg via ORAL

## 2012-12-03 MED ORDER — TRASTUZUMAB CHEMO INJECTION 440 MG
2.0000 mg/kg | Freq: Once | INTRAVENOUS | Status: AC
  Administered 2012-12-03: 168 mg via INTRAVENOUS
  Filled 2012-12-03: qty 8

## 2012-12-03 NOTE — Telephone Encounter (Signed)
Per staff message and POF I have scheduled appts.  JMW  

## 2012-12-03 NOTE — Patient Instructions (Signed)
Proceed with chemotherapy/Herceptin today  Congratulations!!!!  I will see you back on 01/05/13 for every 3 week Herceptin

## 2012-12-03 NOTE — Progress Notes (Signed)
OFFICE PROGRESS NOTE  CC  [name] Aliment, MD [address] [place] [address] Dr. 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 Patient is status post bilateral mastectomy with immediate reconstruction  STAGE:  Right breast (mastectomy)  T1cN0 1.2 cm ER-/PR-/her-/ki-67 92% 0/1 nodes positive  Left breast (mastectomy with snl) T1cN0 1.4 cm, 1.2 cm, 0.1c ER 100%, PR 11%, her2neu positive 6.50, Ki-67 80%   PRIOR THERAPY:  #1screening mammogram performed that showed 2 areas of concern on the left side.calcifications were noted in the left. In the right breast a possible mass warranted further evaluation.   #2 On 03/29/2012 patient underwent a bilateral diagnostic mammogram and right breast ultrasound. A spot magnification images demonstrate suspicious group of pleomorphic microcalcifications over the outer lower left breast with additional suspicious group of pleomorphic microcalcifications over the outer midportion of the left periareolar region. Spot compression images of the right breast demonstrate persistence of a 1 cm density at the edge of the film in the deep third of the right inner breast.   #3Ultrasound performed showed no focal abnormality over the entire in her right breast to correspond to the mammographic density. Patient was recommended stereotactic core needle biopsy of the 2 groups of suspicious left breast microcalcifications. Because patient and her husband wanted bilateral mastectomies MRI was not performed.on 04/15/2012 patient had needle core biopsy performed of the 2 areas in the left breast. The left needle core biopsy in the lower breast revealed ductal carcinoma in situ with associated comedo necrosis and calcifications with the in situ carcinoma. It was ER +100% PR +12%. 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 Tumor was ER +100% PR +11% proliferation marker Ki-67 elevated at 80% HER-2/neu showed amplification with a ratio of 6.50.   #4 patient is now status post bilateral mastectomies performed on 05/14/2012 with immediate reconstruction. The final pathology revealed bilateral breast cancers. In the right breast tissue is noted to have a 1.2 cm invasive ductal carcinoma high-grade ER negative PR negative HER-2/neu negative with Ki-67 92% one node was negative for metastatic disease but this was not the sentinel lymph node. On the left side the patient had multifocal disease measuring 1.4 cm, 1.2 cm, 0.1 cm. Tumor was ER +100% PR +11% HER-2/neu positive with a ratio 6.50. Ki-67 was 80% 8 sentinel nodes were negative for metastatic disease.  #5 patient has had immediate reconstruction with expanders bilaterally.  #6s/p adjuvant chemotherapy initially consisting of Adriamycin Cytoxan every 2 weeks for a total of 4 cycles. Given 07/02/12 - 08/12/12  #7This will then be followed by Taxol and Herceptin to be given weekly for 12 weeks time.beginning 08/27/12. This was discontinued due to neuropathy.  #8 Abraxane and Herceptin days 1, day 8, day 15 on a 28 day cycle. Beginning 09/03/2012   CURRENT THERAPY: Adjuvant Abraxane and Herceptin cycle 4   INTERVAL HISTORY: [place] G Fare 62 y.o. female returns for followup visit. overall patient is doing well. She continues to tolerate the abraxane well.  She denies fevers, chills, nausea, vomiting, constipation, diarrhea.  The numbness is stable.  She does occasionally have numbness that resolves with elevating her legs and massaging her feet.   Remainder of the 10 point review of systems is negative.  MEDICAL HISTORY: Past Medical History  Diagnosis Date  . Heart murmur   . Diabetes mellitus without complication   . Osteopenia   . Cancer     breast  .  Bronchitis   . Pneumonia   . Hiatal hernia   . Sinus problem   . Diabetes mellitus   . Breast cancer 04/15/12    left-biopsy  . Breast cancer, right breast 05/14/12    right mastectomy    ALLERGIES:  is allergic to darvon and penicillins.  MEDICATIONS:  Current Outpatient Prescriptions  Medication Sig Dispense Refill  . Ascorbic Acid (VITAMIN C) 500 MG tablet Take 500 mg by mouth daily.        . Biotin 10 MG TABS Take by mouth. 4000 mcg daily      . calcitonin, salmon, (MIACALCIN/FORTICAL) 200 UNIT/ACT nasal spray Place 1 spray into the nose daily.      . Calcium Citrate (CITRACAL PO) Take by mouth.      . Cholecalciferol (VITAMIN D PO) Take 1 tablet by mouth daily.      . fluticasone (FLONASE) 50 MCG/ACT nasal spray Place 2 sprays into the nose daily.  16 g  2  . gabapentin (NEURONTIN) 100 MG capsule Take 1 capsule at bedtime  30 capsule  4  . KRILL OIL PO Take by mouth.      . lidocaine-prilocaine (EMLA) cream Apply topically as needed.  30 g  6  . Multiple Vitamin (MULTIVITAMIN PO) Take 1 tablet by mouth daily.       . pantoprazole (PROTONIX) 40 MG tablet Take 40 mg by mouth daily.        . Probiotic Product (PROBIOTIC DAILY PO) Take by mouth daily.      . rosuvastatin (CRESTOR) 5 MG tablet Take 5 mg by mouth every Monday, Wednesday, and Friday.      . sitaGLIPtin (JANUVIA) 100 MG tablet Take 100 mg by mouth daily.      Marland Kitchen UNABLE TO FIND Apply 1 Device topically once. Cranial prosthesis r/t chemotherapy treatment  1 Device  0   No current facility-administered medications for this visit.    SURGICAL HISTORY:  Past Surgical History  Procedure Laterality Date  . Cesarean section  1981, 1983  . Tummy tuck  1999  . Tonsillectomy    . Foot surgery    . Gastric bypass    . Wound debridement  01/14/2011    Procedure: DEBRIDEMENT ABDOMINAL WOUND;  Surgeon: Ernestene Mention, MD;  Location: [facility];  Service: General;  Laterality: N/A;  wound debridement and  debridement on the abdomen  . Cosmetic surgery    . Total mastectomy Bilateral 05/14/2012    Procedure: TOTAL MASTECTOMY;  Surgeon: Ernestene Mention, MD;  Location: [facility] OR;  Service: General;  Laterality: Bilateral;  . Axillary sentinel node biopsy Left 05/14/2012    Procedure: AXILLARY SENTINEL NODE BIOPSY;  Surgeon: Ernestene Mention, MD;  Location: [facility] OR;  Service: General;  Laterality: Left;  . Portacath placement N/A 05/14/2012    Procedure: INSERTION PORT-A-CATH;  Surgeon: Ernestene Mention, MD;  Location: [facility] OR;  Service: General;  Laterality: N/A;  . Breast reconstruction with placement of tissue expander and flex hd (acellular hydrated dermis) Bilateral 05/14/2012    Procedure: BREAST RECONSTRUCTION WITH PLACEMENT OF TISSUE EXPANDER AND FLEX HD (ACELLULAR HYDRATED DERMIS);  Surgeon: Etter Sjogren, MD;  Location: [facility] OR;  Service: Plastics;  Laterality: Bilateral;  . Bunionectomy      REVIEW OF SYSTEMS:  Pertinent items  are noted in HPI.   HEALTH MAINTENANCE:   PHYSICAL EXAMINATION: Blood pressure 152/86, pulse 88, temperature 98.2 F (36.8 C), temperature source Oral, resp. rate 18, height 5\' 4"  (1.626 m), weight 195 lb 3.2 oz (88.542 kg), SpO2 100.00%. Body mass index is 33.49 kg/(m^2). General: Patient is a well appearing female in no acute distress HEENT: PERRLA, sclerae anicteric no conjunctival pallor, MMM Neck: supple, no palpable adenopathy Lungs: clear to auscultation bilaterally, no wheezes, rhonchi, or rales Cardiovascular: regular rate rhythm, S1, S2, no murmurs, rubs or gallops Abdomen: Soft, non-tender, non-distended, normoactive bowel sounds, no HSM Extremities: warm and well perfused, no clubbing, cyanosis, or edema Skin: No rashes or lesions Neuro: Non-focal ECOG PERFORMANCE STATUS: 0 - Asymptomatic  LABORATORY DATA: Lab Results  Component Value Date   WBC 4.3 12/03/2012   HGB 11.3* 12/03/2012   HCT 34.2* 12/03/2012   MCV 86.1 12/03/2012   PLT 320 12/03/2012       Chemistry      Component Value Date/Time   NA 145 11/24/2012 0814   NA 139 05/17/2012 0530   K 3.9 11/24/2012 0814   K 3.7 05/17/2012 0530   CL 104 07/30/2012 0955   CL 100 05/17/2012 0530   CO2 28 11/24/2012 0814   CO2 33* 05/17/2012 0530   BUN 7.4 11/24/2012 0814   BUN 4* 05/17/2012 0530   CREATININE 0.7 11/24/2012 0814   CREATININE 0.56 05/17/2012 0530      Component Value Date/Time   CALCIUM 9.6 11/24/2012 0814   CALCIUM 8.7 05/17/2012 0530   ALKPHOS 87 11/24/2012 0814   ALKPHOS 103 05/10/2012 1440   AST 21 11/24/2012 0814   AST 25 05/10/2012 1440   ALT 18 11/24/2012 0814   ALT 17 05/10/2012 1440   BILITOT 0.32 11/24/2012 0814   BILITOT 0.4 05/10/2012 1440    ADDITIONAL INFORMATION: 6. PROGNOSTIC INDICATORS - ACIS Results: IMMUNOHISTOCHEMICAL AND MORPHOMETRIC ANALYSIS BY THE AUTOMATED CELLULAR IMAGING SYSTEM (ACIS) Estrogen Receptor: 0%, NEGATIVE Progesterone Receptor: 0%, NEGATIVE Proliferation Marker Ki67: 92% COMMENT: The negative hormone receptor study(ies) in this case have an internal positive control. REFERENCE RANGE ESTROGEN RECEPTOR NEGATIVE <1% POSITIVE =>1% PROGESTERONE RECEPTOR NEGATIVE <1% POSITIVE =>1% All controls stained appropriately Pecola Leisure MD Pathologist, Electronic Signature ( Signed 05/21/2012) 6. CHROMOGENIC IN-SITU HYBRIDIZATION Results: HER-2/NEU BY CISH - NO AMPLIFICATION OF HER-2 DETECTED. 1 of 6 Duplicate copy FINAL for [place], [place] G [phone]) ADDITIONAL INFORMATION:(continued) RESULT RATIO OF HER2: CEP 17 SIGNALS 1.19 AVERAGE HER2 COPY NUMBER PER CELL 1.85 REFERENCE RANGE NEGATIVE HER2/Chr17 Ratio <2.0 and Average HER2 copy number <4.0 EQUIVOCAL HER2/Chr17 Ratio <2.0 and Average HER2 copy number 4.0 and <6.0 POSITIVE HER2/Chr17 Ratio >=2.0 and/or Average HER2 copy number >=6.0 Pecola Leisure MD Pathologist, Electronic Signature ( Signed 05/20/2012) FINAL DIAGNOSIS Diagnosis 1. Lymph node, sentinel, biopsy, Left axillary -  THERE IS NO EVIDENCE OF CARCINOMA IN 1 OF 1 LYMPH NODE (0/1). 2. Lymph node, sentinel, biopsy, Left axillary - THERE IS NO EVIDENCE OF CARCINOMA IN 1 OF 1 LYMPH NODE (0/1). 3. Lymph node, sentinel, biopsy, Left axillary - THERE IS NO EVIDENCE OF CARCINOMA IN 1 OF 1 LYMPH NODE (0/1). 4. Lymph node, sentinel, biopsy, Left axillary - THERE IS NO EVIDENCE OF CARCINOMA IN 1 OF 1 LYMPH NODE (0/1). 5. Lymph node, sentinel, biopsy, Left axillary - THERE IS NO EVIDENCE OF CARCINOMA IN 1 OF 1 LYMPH NODE (0/1). 6. Breast, simple mastectomy, Right - INVASIVE DUCTAL CARCINOMA, GRADE III/III, SPANNING 1.2 CM. - DUCTAL CARCINOMA  IN SITU, HIGH GRADE. - LYMPHOVASCULAR INVASION IS IDENTIFIED. - THERE IS NO EVIDENCE OF CARCINOMA IN 1 OF 1 LYMPH NODE (0/1). - SEE ONCOLOGY TABLE BELOW. 7. Breast, simple mastectomy, Left - INVASIVE DUCTAL CARCINOMA, AT LEAST THREE FOCI, GRADE II/III, SPANNING 1.4 CM, 1.2 CM, AND 0.1 CM. - DUCTAL CARCINOMA IN SITU, INTERMEDIATE GRADE. - LYMPHOVASCULAR INVASION IS IDENTIFIED. - DUCTAL CARCINOMA IN SITU IS FOCALLY 0.2 CM TO THE ANTERIOR/INFERIOR SOFT TISSUE RESECTION MARGIN. - LOBULAR NEOPLASIA (LOBULAR CARCINOMA IN SITU). - THERE IS NO EVIDENCE OF CARCINOMA IN 2 OF 2 LYMPH NODES (0/2). - SEE ONCOLOGY TABLE BELOW. 8. Lymph node, biopsy, Left axillary - THERE IS NO EVIDENCE OF CARCINOMA IN 1 OF 1 LYMPH NODE (0/1). Microscopic Comment 6. BREAST, INVASIVE TUMOR, WITH LYMPH NODE SAMPLING Specimen, including laterality: Right breast Procedure: Simple mastectomy Grade: 3 2 of 6 Duplicate copy FINAL for Done, [place] G ([id number]) Microscopic Comment(continued) Tubule formation: 3 Nuclear pleomorphism: 3 Mitotic:2 Tumor size (gross measurement): 1.2 cm Margins: Invasive, distance to closest margin: 1.3 cm to the deep margin (gross measurement) In-situ, distance to closest margin: 1.3 cm to the deep margin (gross measurement) Lymphovascular invasion: Present Ductal  carcinoma in situ: Present Grade: High grade Extensive intraductal component: Not identified Lobular neoplasia: Not identified in specimen #6 Tumor focality: Unifocal Treatment effect: N/A Extent of tumor: Confined to breast parenchyma Lymph nodes: # examined: 1 Lymph nodes with metastasis: 0 Breast prognostic profile: Will be performed on the current case and the results reported separately. TNM: pT1c, pN0 7. BREAST, INVASIVE TUMOR, WITH LYMPH NODE SAMPLING Specimen, including laterality: Left breast Procedure: Simple mastectomy Grade: 2 Tubule formation: 3 Nuclear pleomorphism: 2 Mitotic:2 Tumor size (gross measurements) 1.4 cm, 1.2 cm, and 0.1 cm Margins: Invasive, distance to closest margin: 0.7 cm to the anterior/inferior soft tissue resection margin In-situ, distance to closest margin: Focally 0.2 cm to the anterior/inferior soft tissue resection margin (glass slide measurement) Lymphovascular invasion: Present Ductal carcinoma in situ: Present Grade: Intermediate grade Extensive intraductal component: Yes Lobular neoplasia: Yes (lobular carcinoma in situ) Tumor focality: Multiple foci Treatment effect: N/A Extent of tumor: Confined to breast parenchyma Lymph nodes: # examined: 8 Lymph nodes with metastasis: 0 Breast prognostic profile: [id number] Estrogen receptor: 100% strong staining intensity Progesterone receptor: 11% strong staining intensity Her 2 neu: Amplification was detected. The ratio was 6.50. 3 of 6 Duplicate copy FINAL for [place], [place] G ([id number]) Microscopic Comment(continued) Ki-67: 80% TNM: mpT1c, pN0 Comments: In addition to the two grossly identified nodules, there is invasive carcinoma present in random tissue submitted from the inferior lateral quadrant. The three foci of carcinoma are morphologically similar.   RADIOGRAPHIC STUDIES:  Nm Pet Image Initial (pi) Skull Base To Thigh  05/12/2012  *RADIOLOGY REPORT*  Clinical  Data: Initial treatment strategy for breast cancer.   NUCLEAR MEDICINE PET SKULL BASE TO THIGH  Fasting Blood Glucose:  143  Technique:  20 mCi F-18 FDG was injected intravenously. CT data was obtained and used for attenuation correction and anatomic localization only.  (This was not acquired as a diagnostic CT examination.) Additional exam technical data entered on technologist worksheet.  Comparison:  none  Findings:  Neck: No hypermetabolic lymph nodes in the neck.  Chest:  In the inferior medial aspect of the right breast there is a 10 mm nodule (image 99) with moderate  metabolic activity ( SUV max = 2.9).  No hypermetabolic mediastinal or hilar lymph nodes.  There is hypermetabolic activity in the thoracic inlet  which is felt to be vascular in nature.  There is activity adjacent to the thoracic aorta which is also felt to be vascular may relate to the hemiazygos vein.  Abdomen/Pelvis:  No abnormal hypermetabolic activity within the liver, pancreas, adrenal glands, or spleen.  No hypermetabolic lymph nodes in the abdomen or pelvis.  Skeleton:  No focal hypermetabolic activity to suggest skeletal metastasis.  IMPRESSION:  1.  No evidence of distant breast cancer metastasis. 2.  Hypermetabolic nodule within the inferior medial right breast   Original Report Authenticated By: Genevive Bi, M.D.    Nm Sentinel Node Inj-no Rpt (breast)  05/14/2012  CLINICAL DATA: cancer left breast   Sulfur colloid was injected intradermally by the nuclear medicine  technologist for breast cancer sentinel node localization.     Dg Chest Port 1 View  05/14/2012  *RADIOLOGY REPORT*  Clinical Data: Status post Port-A-Cath placement  PORTABLE CHEST - 1 VIEW  Comparison: None.  Findings: The cardiac shadow is within normal limits.  Bilateral tissue expanders as well as surgical drains are identified.  A right chest wall port is seen. The catheter tip is at the cavoatrial junction.  No pneumothorax is noted.  The lungs are clear  bilaterally.  IMPRESSION: No evidence of pneumothorax following port placement.  Bilateral tissue expanders.   Original Report Authenticated By: Alcide Clever, M.D.    Dg Fluoro Guide Cv Line-no Report  05/14/2012  CLINICAL DATA: LEFT BREAST CANCER/PORT-A-CATH   FLOURO GUIDE CV LINE  Fluoroscopy was utilized by the requesting physician.  No radiographic  interpretation.      ASSESSMENT: 62 year old female with  #1 bilateral breast cancers on the right side she has triple-negative disease stage I left side stage I ER positive PR positive HER-2/neu positive disease. Patient is status post bilateral mastectomies. Of note on the right side the lymph node removed was not a sentinel lymph node. It is unclear what to do with this side in terms of whether or not to do further sentinel lymph node. Clinically patient seems to be doing well.  #2 we discussed her pathology in detail. We discussed chemotherapy to be given adjuvantly. We discussed Adriamycin Cytoxan given every 2 weeks for a total of 4 cycles followed by Taxol and Herceptin given weekly x12 weeks followed by Herceptin every 3 weeks to finish out a total of one year of therapy. We also discussed role of adjuvant hormonal therapy since the left side is ER positive.  #3 patient is now here (5/16) to begin cycle 1 day 1 of Adriamycin and Cytoxan to be given every 2 weeks for a total of 4 cycles. Patient is now status post 4 cycles of Adriamycin Cytoxan given from 07/02/2012 through 08/12/2012. Overall she tolerated the treatment well.  #4 patient will begin Taxol and Herceptin every week for a total of 12 weeks beginning 08/27/2012. Patient understands risks benefits and side effects. Status post one cycle on 08/27/2012 and then discontinued due to neuropathy.  #5 patient will switch to Abraxane and Herceptin 3 weeks on one week off to complete out 12 weeks of therapy.she began Abraxane and Herceptin starting on 09/03/2012.she is s/p 4 cycles  PLAN:   #1proceed with final cycle of abraxane and herceptin today  #2 she will return in November to begin Herceptin every 3 weeksand arimidex 1 mg daily   All questions were answered. The patient knows to call the clinic with any problems, questions or concerns. We can certainly see the patient much sooner  if necessary.  I spent 25 minutes counseling the patient face to face. The total time spent in the appointment was 30 minutes.  Drue Second, MD Medical/Oncology [facility] [phone] (beeper) [phone] (Office)

## 2012-12-03 NOTE — Patient Instructions (Signed)
CONGRATULATIONS ON COMPLETING YOUR LAST CHEMOTHERAPY TODAY!!!!!   [facility] Discharge Instructions for Patients Receiving Chemotherapy  Today you received the following chemotherapy agents abraxane, herceptin  To help prevent nausea and vomiting after your treatment, we encourage you to take your nausea medication as needed  If you develop nausea and vomiting that is not controlled by your nausea medication, call the clinic.   BELOW ARE SYMPTOMS THAT SHOULD BE REPORTED IMMEDIATELY:  *FEVER GREATER THAN 100.5 F  *CHILLS WITH OR WITHOUT FEVER  NAUSEA AND VOMITING THAT IS NOT CONTROLLED WITH YOUR NAUSEA MEDICATION  *UNUSUAL SHORTNESS OF BREATH  *UNUSUAL BRUISING OR BLEEDING  TENDERNESS IN MOUTH AND THROAT WITH OR WITHOUT PRESENCE OF ULCERS  *URINARY PROBLEMS  *BOWEL PROBLEMS  UNUSUAL RASH Items with * indicate a potential emergency and should be followed up as soon as possible.  Feel free to call the clinic you have any questions or concerns. The clinic phone number is [phone].

## 2012-12-31 ENCOUNTER — Ambulatory Visit: Payer: BC Managed Care – PPO

## 2012-12-31 ENCOUNTER — Ambulatory Visit: Payer: BC Managed Care – PPO | Admitting: Adult Health

## 2012-12-31 ENCOUNTER — Other Ambulatory Visit: Payer: BC Managed Care – PPO | Admitting: Lab

## 2013-01-05 ENCOUNTER — Ambulatory Visit (HOSPITAL_BASED_OUTPATIENT_CLINIC_OR_DEPARTMENT_OTHER): Payer: BC Managed Care – PPO | Admitting: Oncology

## 2013-01-05 ENCOUNTER — Other Ambulatory Visit (HOSPITAL_BASED_OUTPATIENT_CLINIC_OR_DEPARTMENT_OTHER): Payer: BC Managed Care – PPO | Admitting: Lab

## 2013-01-05 ENCOUNTER — Ambulatory Visit (HOSPITAL_BASED_OUTPATIENT_CLINIC_OR_DEPARTMENT_OTHER): Payer: BC Managed Care – PPO

## 2013-01-05 VITALS — BP 143/82 | HR 98 | Temp 98.6°F | Resp 18 | Ht 64.0 in | Wt 193.6 lb

## 2013-01-05 DIAGNOSIS — C50119 Malignant neoplasm of central portion of unspecified female breast: Secondary | ICD-10-CM

## 2013-01-05 DIAGNOSIS — Z5112 Encounter for antineoplastic immunotherapy: Secondary | ICD-10-CM

## 2013-01-05 DIAGNOSIS — C50911 Malignant neoplasm of unspecified site of right female breast: Secondary | ICD-10-CM

## 2013-01-05 DIAGNOSIS — Z9010 Acquired absence of unspecified breast and nipple: Secondary | ICD-10-CM

## 2013-01-05 DIAGNOSIS — C50919 Malignant neoplasm of unspecified site of unspecified female breast: Secondary | ICD-10-CM

## 2013-01-05 DIAGNOSIS — C50912 Malignant neoplasm of unspecified site of left female breast: Secondary | ICD-10-CM

## 2013-01-05 DIAGNOSIS — Z171 Estrogen receptor negative status [ER-]: Secondary | ICD-10-CM

## 2013-01-05 DIAGNOSIS — Z170 Estrogen receptor positive status [ER+]: Secondary | ICD-10-CM

## 2013-01-05 LAB — COMPREHENSIVE METABOLIC PANEL (CC13)
ALT: 13 U/L (ref 0–55)
AST: 16 U/L (ref 5–34)
Albumin: 3.4 g/dL — ABNORMAL LOW (ref 3.5–5.0)
Alkaline Phosphatase: 91 U/L (ref 40–150)
Anion Gap: 11 mEq/L (ref 3–11)
BUN: 7.6 mg/dL (ref 7.0–26.0)
CO2: 30 mEq/L — ABNORMAL HIGH (ref 22–29)
Calcium: 10 mg/dL (ref 8.4–10.4)
Chloride: 103 mEq/L (ref 98–109)
Creatinine: 0.7 mg/dL (ref 0.6–1.1)
Glucose: 174 mg/dl — ABNORMAL HIGH (ref 70–140)
Potassium: 4 mEq/L (ref 3.5–5.1)
Sodium: 144 mEq/L (ref 136–145)
Total Bilirubin: 0.36 mg/dL (ref 0.20–1.20)
Total Protein: 6.6 g/dL (ref 6.4–8.3)

## 2013-01-05 LAB — CBC WITH DIFFERENTIAL/PLATELET
BASO%: 0.3 % (ref 0.0–2.0)
Basophils Absolute: 0 10*3/uL (ref 0.0–0.1)
EOS%: 0.7 % (ref 0.0–7.0)
Eosinophils Absolute: 0.1 10*3/uL (ref 0.0–0.5)
HCT: 35.4 % (ref 34.8–46.6)
HGB: 11.4 g/dL — ABNORMAL LOW (ref 11.6–15.9)
LYMPH%: 11.2 % — ABNORMAL LOW (ref 14.0–49.7)
MCH: 26.6 pg (ref 25.1–34.0)
MCHC: 32.1 g/dL (ref 31.5–36.0)
MCV: 82.9 fL (ref 79.5–101.0)
MONO#: 0.8 10*3/uL (ref 0.1–0.9)
MONO%: 8.3 % (ref 0.0–14.0)
NEUT#: 7.7 10*3/uL — ABNORMAL HIGH (ref 1.5–6.5)
NEUT%: 79.5 % — ABNORMAL HIGH (ref 38.4–76.8)
Platelets: 229 10*3/uL (ref 145–400)
RBC: 4.27 10*6/uL (ref 3.70–5.45)
RDW: 17.4 % — ABNORMAL HIGH (ref 11.2–14.5)
WBC: 9.7 10*3/uL (ref 3.9–10.3)
lymph#: 1.1 10*3/uL (ref 0.9–3.3)

## 2013-01-05 MED ORDER — DIPHENHYDRAMINE HCL 25 MG PO CAPS
ORAL_CAPSULE | ORAL | Status: AC
  Filled 2013-01-05: qty 1

## 2013-01-05 MED ORDER — TRASTUZUMAB CHEMO INJECTION 440 MG
6.0000 mg/kg | Freq: Once | INTRAVENOUS | Status: AC
  Administered 2013-01-05: 525 mg via INTRAVENOUS
  Filled 2013-01-05: qty 25

## 2013-01-05 MED ORDER — SODIUM CHLORIDE 0.9 % IJ SOLN
10.0000 mL | INTRAMUSCULAR | Status: DC | PRN
  Administered 2013-01-05: 10 mL
  Filled 2013-01-05: qty 10

## 2013-01-05 MED ORDER — ACETAMINOPHEN 325 MG PO TABS
650.0000 mg | ORAL_TABLET | Freq: Once | ORAL | Status: AC
  Administered 2013-01-05: 650 mg via ORAL

## 2013-01-05 MED ORDER — LETROZOLE 2.5 MG PO TABS: 2.5000 mg | ORAL_TABLET | Freq: Every day | ORAL | Status: DC

## 2013-01-05 MED ORDER — SODIUM CHLORIDE 0.9 % IV SOLN
Freq: Once | INTRAVENOUS | Status: AC
  Administered 2013-01-05: 09:00:00 via INTRAVENOUS

## 2013-01-05 MED ORDER — ACETAMINOPHEN 325 MG PO TABS
ORAL_TABLET | ORAL | Status: AC
  Filled 2013-01-05: qty 2

## 2013-01-05 MED ORDER — DIPHENHYDRAMINE HCL 25 MG PO CAPS
50.0000 mg | ORAL_CAPSULE | Freq: Once | ORAL | Status: AC
  Administered 2013-01-05: 25 mg via ORAL

## 2013-01-05 MED ORDER — AZITHROMYCIN 250 MG PO TABS: ORAL_TABLET | ORAL | Status: DC

## 2013-01-05 MED ORDER — HEPARIN SOD (PORK) LOCK FLUSH 100 UNIT/ML IV SOLN
500.0000 [IU] | Freq: Once | INTRAVENOUS | Status: AC | PRN
  Administered 2013-01-05: 500 [IU]
  Filled 2013-01-05: qty 5

## 2013-01-05 NOTE — Patient Instructions (Signed)
[facility] Discharge Instructions for Patients Receiving Chemotherapy  Today you received the following chemotherapy agents Herceptin  To help prevent nausea and vomiting after your treatment, we encourage you to take your nausea medication     If you develop nausea and vomiting that is not controlled by your nausea medication, call the clinic.   BELOW ARE SYMPTOMS THAT SHOULD BE REPORTED IMMEDIATELY:  *FEVER GREATER THAN 100.5 F  *CHILLS WITH OR WITHOUT FEVER  NAUSEA AND VOMITING THAT IS NOT CONTROLLED WITH YOUR NAUSEA MEDICATION  *UNUSUAL SHORTNESS OF BREATH  *UNUSUAL BRUISING OR BLEEDING  TENDERNESS IN MOUTH AND THROAT WITH OR WITHOUT PRESENCE OF ULCERS  *URINARY PROBLEMS  *BOWEL PROBLEMS  UNUSUAL RASH Items with * indicate a potential emergency and should be followed up as soon as possible.  Feel free to call the clinic you have any questions or concerns. The clinic phone number is [phone].    

## 2013-01-07 ENCOUNTER — Telehealth: Payer: Self-pay | Admitting: *Deleted

## 2013-01-07 NOTE — Telephone Encounter (Signed)
I have adjusted 12/30 appt 

## 2013-01-17 NOTE — Progress Notes (Signed)
OFFICE PROGRESS NOTE  CC  [name] Aliment, MD [address] [place] [address] Dr. 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 Patient is status post bilateral mastectomy with immediate reconstruction  STAGE:  Right breast (mastectomy)  T1cN0 1.2 cm ER-/PR-/her-/ki-67 92% 0/1 nodes positive  Left breast (mastectomy with snl) T1cN0 1.4 cm, 1.2 cm, 0.1c ER 100%, PR 11%, her2neu positive 6.50, Ki-67 80%   PRIOR THERAPY:  #1screening mammogram performed that showed 2 areas of concern on the left side.calcifications were noted in the left. In the right breast a possible mass warranted further evaluation.   #2 On 03/29/2012 patient underwent a bilateral diagnostic mammogram and right breast ultrasound. A spot magnification images demonstrate suspicious group of pleomorphic microcalcifications over the outer lower left breast with additional suspicious group of pleomorphic microcalcifications over the outer midportion of the left periareolar region. Spot compression images of the right breast demonstrate persistence of a 1 cm density at the edge of the film in the deep third of the right inner breast.   #3Ultrasound performed showed no focal abnormality over the entire in her right breast to correspond to the mammographic density. Patient was recommended stereotactic core needle biopsy of the 2 groups of suspicious left breast microcalcifications. Because patient and her husband wanted bilateral mastectomies MRI was not performed.on 04/15/2012 patient had needle core biopsy performed of the 2 areas in the left breast. The left needle core biopsy in the lower breast revealed ductal carcinoma in situ with associated comedo necrosis and calcifications with the in situ carcinoma. It was ER +100% PR +12%. The subareolar  needle core biopsy of the left breast revealed invasive ductal carcinoma grade 2-3 with DCIS. Tumor was ER +100% PR +11% proliferation marker Ki-67 elevated at 80% HER-2/neu showed amplification with a ratio of 6.50.   #4 patient is now status post bilateral mastectomies performed on 05/14/2012 with immediate reconstruction. The final pathology revealed bilateral breast cancers. In the right breast tissue is noted to have a 1.2 cm invasive ductal carcinoma high-grade ER negative PR negative HER-2/neu negative with Ki-67 92% one node was negative for metastatic disease but this was not the sentinel lymph node. On the left side the patient had multifocal disease measuring 1.4 cm, 1.2 cm, 0.1 cm. Tumor was ER +100% PR +11% HER-2/neu positive with a ratio 6.50. Ki-67 was 80% 8 sentinel nodes were negative for metastatic disease.  #5 patient has had immediate reconstruction with expanders bilaterally.  #6s/p adjuvant chemotherapy initially consisting of Adriamycin Cytoxan every 2 weeks for a total of 4 cycles. Given 07/02/12 - 08/12/12  #7This will then be followed by Taxol and Herceptin to be given weekly for 12 weeks time.beginning 08/27/12. This was discontinued due to neuropathy.  #8 Abraxane and Herceptin days 1, day 8, day 15 on a 28 day cycle. Beginning 09/03/2012 - 11/2012  #9 adjuvant Herceptin every 3 weeks beginning 01/05/2013   CURRENT THERAPY: adjuvant curative intent Herceptin every 3 weeks  INTERVAL HISTORY: [place] G Ki 62 y.o. female returns for followup visit. overall patient is doing well. She continues to tolerate the abraxane well.  She denies fevers, chills, nausea, vomiting, constipation, diarrhea.   Remainder of the 10 point review of systems is negative.  MEDICAL HISTORY: Past Medical History  Diagnosis Date  . Heart murmur   . Diabetes mellitus  without complication   . Osteopenia   . Cancer     breast  . Bronchitis   . Pneumonia   . Hiatal hernia   . Sinus  problem   . Diabetes mellitus   . Breast cancer 04/15/12    left-biopsy  . Breast cancer, right breast 05/14/12    right mastectomy    ALLERGIES:  is allergic to darvon and penicillins.  MEDICATIONS:  Current Outpatient Prescriptions  Medication Sig Dispense Refill  . Ascorbic Acid (VITAMIN C) 500 MG tablet Take 500 mg by mouth daily.        Marland Kitchen azithromycin (ZITHROMAX Z-PAK) 250 MG tablet Take 2 today then 1 a day until completed  6 each  1  . Biotin 10 MG TABS Take by mouth. 4000 mcg daily      . calcitonin, salmon, (MIACALCIN/FORTICAL) 200 UNIT/ACT nasal spray Place 1 spray into the nose daily.      . Calcium Citrate (CITRACAL PO) Take by mouth.      . Cholecalciferol (VITAMIN D PO) Take 1 tablet by mouth daily.      . fluticasone (FLONASE) 50 MCG/ACT nasal spray Place 2 sprays into the nose daily.  16 g  2  . gabapentin (NEURONTIN) 100 MG capsule Take 1 capsule at bedtime  30 capsule  4  . KRILL OIL PO Take by mouth.      . letrozole (FEMARA) 2.5 MG tablet Take 1 tablet (2.5 mg total) by mouth daily.  90 tablet  12  . lidocaine-prilocaine (EMLA) cream Apply topically as needed.  30 g  6  . Multiple Vitamin (MULTIVITAMIN PO) Take 1 tablet by mouth daily.       . pantoprazole (PROTONIX) 40 MG tablet Take 40 mg by mouth daily.        . Probiotic Product (PROBIOTIC DAILY PO) Take by mouth daily.      . rosuvastatin (CRESTOR) 5 MG tablet Take 5 mg by mouth every Monday, Wednesday, and Friday.      . sitaGLIPtin (JANUVIA) 100 MG tablet Take 100 mg by mouth daily.      Marland Kitchen UNABLE TO FIND Apply 1 Device topically once. Cranial prosthesis r/t chemotherapy treatment  1 Device  0   No current facility-administered medications for this visit.    SURGICAL HISTORY:  Past Surgical History  Procedure Laterality Date  . Cesarean section  1981, 1983  . Tummy tuck  1999  . Tonsillectomy    . Foot surgery    . Gastric bypass    . Wound debridement  01/14/2011    Procedure: DEBRIDEMENT ABDOMINAL  WOUND;  Surgeon: Ernestene Mention, MD;  Location: [facility];  Service: General;  Laterality: N/A;  wound debridement and debridement on the abdomen  . Cosmetic surgery    . Total mastectomy Bilateral 05/14/2012    Procedure: TOTAL MASTECTOMY;  Surgeon: Ernestene Mention, MD;  Location[facility] OR;  Service: General;  Laterality: Bilateral;  . Axillary sentinel node biopsy Left 05/14/2012    Procedure: AXILLARY SENTINEL NODE BIOPSY;  Surgeon: Ernestene Mention, MD;  Location: [facility] OR;  Service: General;  Laterality: Left;  . Portacath placement N/A 05/14/2012    Procedure: INSERTION PORT-A-CATH;  Surgeon: Ernestene Mention, MD;  Location: [facility] OR;  Service: General;  Laterality: N/A;  . Breast reconstruction with placement of tissue expander and flex hd (acellular hydrated dermis) Bilateral 05/14/2012    Procedure: BREAST RECONSTRUCTION WITH PLACEMENT OF TISSUE EXPANDER AND FLEX HD (  ACELLULAR HYDRATED DERMIS);  Surgeon: Etter Sjogren, MD;  Location: [facility] OR;  Service: Plastics;  Laterality: Bilateral;  . Bunionectomy      REVIEW OF SYSTEMS:  Pertinent items are noted in HPI.   HEALTH MAINTENANCE:   PHYSICAL EXAMINATION: Blood pressure 143/82, pulse 98, temperature 98.6 F (37 C), temperature source Oral, resp. rate 18, height 5\' 4"  (1.626 m), weight 193 lb 9.6 oz (87.816 kg). Body mass index is 33.21 kg/(m^2). General: Patient is a well appearing female in no acute distress HEENT: PERRLA, sclerae anicteric no conjunctival pallor, MMM Neck: supple, no palpable adenopathy Lungs: clear to auscultation bilaterally, no wheezes, rhonchi, or rales Cardiovascular: regular rate rhythm, S1, S2, no murmurs, rubs or gallops Abdomen: Soft, non-tender, non-distended, normoactive bowel sounds, no HSM Extremities: warm and well perfused, no clubbing, cyanosis, or edema Skin: No rashes or lesions Neuro: Non-focal ECOG PERFORMANCE STATUS: 0 - Asymptomatic  LABORATORY DATA: Lab Results  Component Value  Date   WBC 9.7 01/05/2013   HGB 11.4* 01/05/2013   HCT 35.4 01/05/2013   MCV 82.9 01/05/2013   PLT 229 01/05/2013      Chemistry      Component Value Date/Time   NA 144 01/05/2013 0758   NA 139 05/17/2012 0530   K 4.0 01/05/2013 0758   K 3.7 05/17/2012 0530   CL 104 07/30/2012 0955   CL 100 05/17/2012 0530   CO2 30* 01/05/2013 0758   CO2 33* 05/17/2012 0530   BUN 7.6 01/05/2013 0758   BUN 4* 05/17/2012 0530   CREATININE 0.7 01/05/2013 0758   CREATININE 0.56 05/17/2012 0530      Component Value Date/Time   CALCIUM 10.0 01/05/2013 0758   CALCIUM 8.7 05/17/2012 0530   ALKPHOS 91 01/05/2013 0758   ALKPHOS 103 05/10/2012 1440   AST 16 01/05/2013 0758   AST 25 05/10/2012 1440   ALT 13 01/05/2013 0758   ALT 17 05/10/2012 1440   BILITOT 0.36 01/05/2013 0758   BILITOT 0.4 05/10/2012 1440    ADDITIONAL INFORMATION: 6. PROGNOSTIC INDICATORS - ACIS Results: IMMUNOHISTOCHEMICAL AND MORPHOMETRIC ANALYSIS BY THE AUTOMATED CELLULAR IMAGING SYSTEM (ACIS) Estrogen Receptor: 0%, NEGATIVE Progesterone Receptor: 0%, NEGATIVE Proliferation Marker Ki67: 92% COMMENT: The negative hormone receptor study(ies) in this case have an internal positive control. REFERENCE RANGE ESTROGEN RECEPTOR NEGATIVE <1% POSITIVE =>1% PROGESTERONE RECEPTOR NEGATIVE <1% POSITIVE =>1% All controls stained appropriately Pecola Leisure MD Pathologist, Electronic Signature ( Signed 05/21/2012) 6. CHROMOGENIC IN-SITU HYBRIDIZATION Results: HER-2/NEU BY CISH - NO AMPLIFICATION OF HER-2 DETECTED. 1 of 6 Duplicate copy FINAL for [place], [place] G [phone]) ADDITIONAL INFORMATION:(continued) RESULT RATIO OF HER2: CEP 17 SIGNALS 1.19 AVERAGE HER2 COPY NUMBER PER CELL 1.85 REFERENCE RANGE NEGATIVE HER2/Chr17 Ratio <2.0 and Average HER2 copy number <4.0 EQUIVOCAL HER2/Chr17 Ratio <2.0 and Average HER2 copy number 4.0 and <6.0 POSITIVE HER2/Chr17 Ratio >=2.0 and/or Average HER2 copy number >=6.0 Pecola Leisure  MD Pathologist, Electronic Signature ( Signed 05/20/2012) FINAL DIAGNOSIS Diagnosis 1. Lymph node, sentinel, biopsy, Left axillary - THERE IS NO EVIDENCE OF CARCINOMA IN 1 OF 1 LYMPH NODE (0/1). 2. Lymph node, sentinel, biopsy, Left axillary - THERE IS NO EVIDENCE OF CARCINOMA IN 1 OF 1 LYMPH NODE (0/1). 3. Lymph node, sentinel, biopsy, Left axillary - THERE IS NO EVIDENCE OF CARCINOMA IN 1 OF 1 LYMPH NODE (0/1). 4. Lymph node, sentinel, biopsy, Left axillary - THERE IS NO EVIDENCE OF CARCINOMA IN 1 OF 1 LYMPH NODE (0/1). 5. Lymph node, sentinel, biopsy, Left axillary -  THERE IS NO EVIDENCE OF CARCINOMA IN 1 OF 1 LYMPH NODE (0/1). 6. Breast, simple mastectomy, Right - INVASIVE DUCTAL CARCINOMA, GRADE III/III, SPANNING 1.2 CM. - DUCTAL CARCINOMA IN SITU, HIGH GRADE. - LYMPHOVASCULAR INVASION IS IDENTIFIED. - THERE IS NO EVIDENCE OF CARCINOMA IN 1 OF 1 LYMPH NODE (0/1). - SEE ONCOLOGY TABLE BELOW. 7. Breast, simple mastectomy, Left - INVASIVE DUCTAL CARCINOMA, AT LEAST THREE FOCI, GRADE II/III, SPANNING 1.4 CM, 1.2 CM, AND 0.1 CM. - DUCTAL CARCINOMA IN SITU, INTERMEDIATE GRADE. - LYMPHOVASCULAR INVASION IS IDENTIFIED. - DUCTAL CARCINOMA IN SITU IS FOCALLY 0.2 CM TO THE ANTERIOR/INFERIOR SOFT TISSUE RESECTION MARGIN. - LOBULAR NEOPLASIA (LOBULAR CARCINOMA IN SITU). - THERE IS NO EVIDENCE OF CARCINOMA IN 2 OF 2 LYMPH NODES (0/2). - SEE ONCOLOGY TABLE BELOW. 8. Lymph node, biopsy, Left axillary - THERE IS NO EVIDENCE OF CARCINOMA IN 1 OF 1 LYMPH NODE (0/1). Microscopic Comment 6. BREAST, INVASIVE TUMOR, WITH LYMPH NODE SAMPLING Specimen, including laterality: Right breast Procedure: Simple mastectomy Grade: 3 2 of 6 Duplicate copy FINAL for [place], [place] G ([id number]) Microscopic Comment(continued) Tubule formation: 3 Nuclear pleomorphism: 3 Mitotic:2 Tumor size (gross measurement): 1.2 cm Margins: Invasive, distance to closest margin: 1.3 cm to the deep margin (gross  measurement) In-situ, distance to closest margin: 1.3 cm to the deep margin (gross measurement) Lymphovascular invasion: Present Ductal carcinoma in situ: Present Grade: High grade Extensive intraductal component: Not identified Lobular neoplasia: Not identified in specimen #6 Tumor focality: Unifocal Treatment effect: N/A Extent of tumor: Confined to breast parenchyma Lymph nodes: # examined: 1 Lymph nodes with metastasis: 0 Breast prognostic profile: Will be performed on the current case and the results reported separately. TNM: pT1c, pN0 7. BREAST, INVASIVE TUMOR, WITH LYMPH NODE SAMPLING Specimen, including laterality: Left breast Procedure: Simple mastectomy Grade: 2 Tubule formation: 3 Nuclear pleomorphism: 2 Mitotic:2 Tumor size (gross measurements) 1.4 cm, 1.2 cm, and 0.1 cm Margins: Invasive, distance to closest margin: 0.7 cm to the anterior/inferior soft tissue resection margin In-situ, distance to closest margin: Focally 0.2 cm to the anterior/inferior soft tissue resection margin (glass slide measurement) Lymphovascular invasion: Present Ductal carcinoma in situ: Present Grade: Intermediate grade Extensive intraductal component: Yes Lobular neoplasia: Yes (lobular carcinoma in situ) Tumor focality: Multiple foci Treatment effect: N/A Extent of tumor: Confined to breast parenchyma Lymph nodes: # examined: 8 Lymph nodes with metastasis: 0 Breast prognostic profile: [phone] Estrogen receptor: 100% strong staining intensity Progesterone receptor: 11% strong staining intensity Her 2 neu: Amplification was detected. The ratio was 6.50. 3 of 6 Duplicate copy FINAL for [place], [place] G ([id number]) Microscopic Comment(continued) Ki-67: 80% TNM: mpT1c, pN0 Comments: In addition to the two grossly identified nodules, there is invasive carcinoma present in random tissue submitted from the inferior lateral quadrant. The three foci of carcinoma are  morphologically similar.   RADIOGRAPHIC STUDIES:  Nm Pet Image Initial (pi) Skull Base To Thigh  05/12/2012  *RADIOLOGY REPORT*  Clinical Data: Initial treatment strategy for breast cancer.   NUCLEAR MEDICINE PET SKULL BASE TO THIGH  Fasting Blood Glucose:  143  Technique:  20 mCi F-18 FDG was injected intravenously. CT data was obtained and used for attenuation correction and anatomic localization only.  (This was not acquired as a diagnostic CT examination.) Additional exam technical data entered on technologist worksheet.  Comparison:  none  Findings:  Neck: No hypermetabolic lymph nodes in the neck.  Chest:  In the inferior medial aspect of the right breast there is a 10 mm  nodule (image 99) with moderate  metabolic activity ( SUV max = 2.9).  No hypermetabolic mediastinal or hilar lymph nodes.  There is hypermetabolic activity in the thoracic inlet which is felt to be vascular in nature.  There is activity adjacent to the thoracic aorta which is also felt to be vascular may relate to the hemiazygos vein.  Abdomen/Pelvis:  No abnormal hypermetabolic activity within the liver, pancreas, adrenal glands, or spleen.  No hypermetabolic lymph nodes in the abdomen or pelvis.  Skeleton:  No focal hypermetabolic activity to suggest skeletal metastasis.  IMPRESSION:  1.  No evidence of distant breast cancer metastasis. 2.  Hypermetabolic nodule within the inferior medial right breast   Original Report Authenticated By: Genevive Bi, M.D.    Nm Sentinel Node Inj-no Rpt (breast)  05/14/2012  CLINICAL DATA: cancer left breast   Sulfur colloid was injected intradermally by the nuclear medicine  technologist for breast cancer sentinel node localization.     Dg Chest Port 1 View  05/14/2012  *RADIOLOGY REPORT*  Clinical Data: Status post Port-A-Cath placement  PORTABLE CHEST - 1 VIEW  Comparison: None.  Findings: The cardiac shadow is within normal limits.  Bilateral tissue expanders as well as surgical drains  are identified.  A right chest wall port is seen. The catheter tip is at the cavoatrial junction.  No pneumothorax is noted.  The lungs are clear bilaterally.  IMPRESSION: No evidence of pneumothorax following port placement.  Bilateral tissue expanders.   Original Report Authenticated By: Alcide Clever, M.D.    Dg Fluoro Guide Cv Line-no Report  05/14/2012  CLINICAL DATA: LEFT BREAST CANCER/PORT-A-CATH   FLOURO GUIDE CV LINE  Fluoroscopy was utilized by the requesting physician.  No radiographic  interpretation.      ASSESSMENT: 62 year old female with  #1 bilateral breast cancers on the right side she has triple-negative disease stage I left side stage I ER positive PR positive HER-2/neu positive disease. Patient is status post bilateral mastectomies. Of note on the right side the lymph node removed was not a sentinel lymph node. It is unclear what to do with this side in terms of whether or not to do further sentinel lymph node. Clinically patient seems to be doing well.  #2status post Adriamycin Cytoxan given every 2 weeks for a total of 4 cycles followed by Taxol and Herceptin given weekly, however after 2-3 cycles patient could not tolerate the Taxol and therefore her treatment was changed to Abraxane and Herceptin. She completed Abraxane Herceptin combination on 10 /2014.  #3 patient to begin adjuvant antiestrogen therapy consisting of Arimidex 1 mg daily starting 01/05/2013. Total of 5 years of therapy is planned.  #4 patient will also begin adjuvant Herceptin every 3 weeks to finish out a total of one year of therapy.  PLAN:  #1 proceed with Herceptin infusion today.  #2 patient will also begin Arimidex 1 mg daily risks benefits and side effects of treatment were discussed with both patient and her husband in detail.  All questions were answered. The patient knows to call the clinic with any problems, questions or concerns. We can certainly see the patient much sooner if necessary.  I  spent 25 minutes counseling the patient face to face. The total time spent in the appointment was 30 minutes.  Drue Second, MD Medical/Oncology [facility] [phone] (beeper) [phone] (Office)

## 2013-01-21 ENCOUNTER — Ambulatory Visit: Payer: BC Managed Care – PPO

## 2013-01-21 ENCOUNTER — Ambulatory Visit: Payer: BC Managed Care – PPO | Admitting: Oncology

## 2013-01-21 ENCOUNTER — Other Ambulatory Visit: Payer: BC Managed Care – PPO | Admitting: Lab

## 2013-01-26 ENCOUNTER — Encounter: Payer: Self-pay | Admitting: Oncology

## 2013-01-26 ENCOUNTER — Telehealth: Payer: Self-pay | Admitting: *Deleted

## 2013-01-26 ENCOUNTER — Ambulatory Visit (HOSPITAL_BASED_OUTPATIENT_CLINIC_OR_DEPARTMENT_OTHER): Payer: BC Managed Care – PPO | Admitting: Oncology

## 2013-01-26 ENCOUNTER — Other Ambulatory Visit (HOSPITAL_BASED_OUTPATIENT_CLINIC_OR_DEPARTMENT_OTHER): Payer: BC Managed Care – PPO

## 2013-01-26 ENCOUNTER — Ambulatory Visit (HOSPITAL_BASED_OUTPATIENT_CLINIC_OR_DEPARTMENT_OTHER): Payer: BC Managed Care – PPO

## 2013-01-26 VITALS — BP 136/85 | HR 81 | Temp 98.3°F | Resp 18 | Ht 64.0 in | Wt 191.3 lb

## 2013-01-26 DIAGNOSIS — C50912 Malignant neoplasm of unspecified site of left female breast: Secondary | ICD-10-CM

## 2013-01-26 DIAGNOSIS — C50119 Malignant neoplasm of central portion of unspecified female breast: Secondary | ICD-10-CM

## 2013-01-26 DIAGNOSIS — Z171 Estrogen receptor negative status [ER-]: Secondary | ICD-10-CM

## 2013-01-26 DIAGNOSIS — C50919 Malignant neoplasm of unspecified site of unspecified female breast: Secondary | ICD-10-CM

## 2013-01-26 DIAGNOSIS — Z5112 Encounter for antineoplastic immunotherapy: Secondary | ICD-10-CM

## 2013-01-26 DIAGNOSIS — C50911 Malignant neoplasm of unspecified site of right female breast: Secondary | ICD-10-CM

## 2013-01-26 DIAGNOSIS — Z170 Estrogen receptor positive status [ER+]: Secondary | ICD-10-CM

## 2013-01-26 LAB — CBC WITH DIFFERENTIAL/PLATELET
BASO%: 1 % (ref 0.0–2.0)
Basophils Absolute: 0 10*3/uL (ref 0.0–0.1)
EOS%: 2.5 % (ref 0.0–7.0)
Eosinophils Absolute: 0.1 10*3/uL (ref 0.0–0.5)
HCT: 38.2 % (ref 34.8–46.6)
HGB: 12.5 g/dL (ref 11.6–15.9)
LYMPH%: 35 % (ref 14.0–49.7)
MCH: 27.2 pg (ref 25.1–34.0)
MCHC: 32.7 g/dL (ref 31.5–36.0)
MCV: 83.2 fL (ref 79.5–101.0)
MONO#: 0.5 10*3/uL (ref 0.1–0.9)
MONO%: 14.5 % — ABNORMAL HIGH (ref 0.0–14.0)
NEUT#: 1.7 10*3/uL (ref 1.5–6.5)
NEUT%: 47 % (ref 38.4–76.8)
Platelets: 278 10*3/uL (ref 145–400)
RBC: 4.59 10*6/uL (ref 3.70–5.45)
RDW: 17.5 % — ABNORMAL HIGH (ref 11.2–14.5)
WBC: 3.5 10*3/uL — ABNORMAL LOW (ref 3.9–10.3)
lymph#: 1.2 10*3/uL (ref 0.9–3.3)

## 2013-01-26 LAB — COMPREHENSIVE METABOLIC PANEL (CC13)
ALT: 19 U/L (ref 0–55)
AST: 21 U/L (ref 5–34)
Albumin: 3.8 g/dL (ref 3.5–5.0)
Alkaline Phosphatase: 94 U/L (ref 40–150)
Anion Gap: 12 mEq/L — ABNORMAL HIGH (ref 3–11)
BUN: 13.3 mg/dL (ref 7.0–26.0)
CO2: 27 mEq/L (ref 22–29)
Calcium: 10.2 mg/dL (ref 8.4–10.4)
Chloride: 104 mEq/L (ref 98–109)
Creatinine: 0.7 mg/dL (ref 0.6–1.1)
Glucose: 129 mg/dl (ref 70–140)
Potassium: 4.7 mEq/L (ref 3.5–5.1)
Sodium: 143 mEq/L (ref 136–145)
Total Bilirubin: 0.4 mg/dL (ref 0.20–1.20)
Total Protein: 7 g/dL (ref 6.4–8.3)

## 2013-01-26 MED ORDER — ACETAMINOPHEN 325 MG PO TABS
ORAL_TABLET | ORAL | Status: AC
  Filled 2013-01-26: qty 2

## 2013-01-26 MED ORDER — DIPHENHYDRAMINE HCL 25 MG PO CAPS
50.0000 mg | ORAL_CAPSULE | Freq: Once | ORAL | Status: AC
  Administered 2013-01-26: 50 mg via ORAL

## 2013-01-26 MED ORDER — HEPARIN SOD (PORK) LOCK FLUSH 100 UNIT/ML IV SOLN
500.0000 [IU] | Freq: Once | INTRAVENOUS | Status: AC | PRN
  Administered 2013-01-26: 500 [IU]
  Filled 2013-01-26: qty 5

## 2013-01-26 MED ORDER — SODIUM CHLORIDE 0.9 % IJ SOLN
10.0000 mL | INTRAMUSCULAR | Status: DC | PRN
  Administered 2013-01-26: 10 mL
  Filled 2013-01-26: qty 10

## 2013-01-26 MED ORDER — TRASTUZUMAB CHEMO INJECTION 440 MG
6.0000 mg/kg | Freq: Once | INTRAVENOUS | Status: AC
  Administered 2013-01-26: 525 mg via INTRAVENOUS
  Filled 2013-01-26: qty 25

## 2013-01-26 MED ORDER — ACETAMINOPHEN 325 MG PO TABS
650.0000 mg | ORAL_TABLET | Freq: Once | ORAL | Status: AC
  Administered 2013-01-26: 650 mg via ORAL

## 2013-01-26 MED ORDER — DIPHENHYDRAMINE HCL 25 MG PO CAPS
ORAL_CAPSULE | ORAL | Status: AC
  Filled 2013-01-26: qty 2

## 2013-01-26 MED ORDER — SODIUM CHLORIDE 0.9 % IV SOLN
Freq: Once | INTRAVENOUS | Status: AC
  Administered 2013-01-26: 10:00:00 via INTRAVENOUS

## 2013-01-26 NOTE — Patient Instructions (Signed)
[facility] Discharge Instructions for Patients Receiving Chemotherapy  Today you received the following chemotherapy agents Herceptin.  To help prevent nausea and vomiting after your treatment, we encourage you to take your nausea medication as prescribed.   If you develop nausea and vomiting that is not controlled by your nausea medication, call the clinic.   BELOW ARE SYMPTOMS THAT SHOULD BE REPORTED IMMEDIATELY:  *FEVER GREATER THAN 100.5 F  *CHILLS WITH OR WITHOUT FEVER  NAUSEA AND VOMITING THAT IS NOT CONTROLLED WITH YOUR NAUSEA MEDICATION  *UNUSUAL SHORTNESS OF BREATH  *UNUSUAL BRUISING OR BLEEDING  TENDERNESS IN MOUTH AND THROAT WITH OR WITHOUT PRESENCE OF ULCERS  *URINARY PROBLEMS  *BOWEL PROBLEMS  UNUSUAL RASH Items with * indicate a potential emergency and should be followed up as soon as possible.  Feel free to call the clinic you have any questions or concerns. The clinic phone number is [phone].    

## 2013-01-26 NOTE — Telephone Encounter (Signed)
Per staff message and POF I have scheduled appts.  JMW  

## 2013-01-26 NOTE — Progress Notes (Signed)
OFFICE PROGRESS NOTE  CC  [name], MD [address] [place] [address] Dr. 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 Patient is status post bilateral mastectomy with immediate reconstruction  STAGE:  Right breast (mastectomy)  T1cN0 1.2 cm ER-/PR-/her-/ki-67 92% 0/1 nodes positive  Left breast (mastectomy with snl) T1cN0 1.4 cm, 1.2 cm, 0.1c ER 100%, PR 11%, her2neu positive 6.50, Ki-67 80%   PRIOR THERAPY:  #1screening mammogram performed that showed 2 areas of concern on the left side.calcifications were noted in the left. In the right breast a possible mass warranted further evaluation.   #2 On 03/29/2012 patient underwent a bilateral diagnostic mammogram and right breast ultrasound. A spot magnification images demonstrate suspicious group of pleomorphic microcalcifications over the outer lower left breast with additional suspicious group of pleomorphic microcalcifications over the outer midportion of the left periareolar region. Spot compression images of the right breast demonstrate persistence of a 1 cm density at the edge of the film in the deep third of the right inner breast.   #3Ultrasound performed showed no focal abnormality over the entire in her right breast to correspond to the mammographic density. Patient was recommended stereotactic core needle biopsy of the 2 groups of suspicious left breast microcalcifications. Because patient and her husband wanted bilateral mastectomies MRI was not performed.on 04/15/2012 patient had needle core biopsy performed of the 2 areas in the left breast. The left needle core biopsy in the lower breast revealed ductal carcinoma in situ with associated comedo necrosis and calcifications with the in situ carcinoma. It was ER +100% PR +12%. The subareolar  needle core biopsy of the left breast revealed invasive ductal carcinoma grade 2-3 with DCIS. Tumor was ER +100% PR +11% proliferation marker Ki-67 elevated at 80% HER-2/neu showed amplification with a ratio of 6.50.   #4 patient is now status post bilateral mastectomies performed on 05/14/2012 with immediate reconstruction. The final pathology revealed bilateral breast cancers. In the right breast tissue is noted to have a 1.2 cm invasive ductal carcinoma high-grade ER negative PR negative HER-2/neu negative with Ki-67 92% one node was negative for metastatic disease but this was not the sentinel lymph node. On the left side the patient had multifocal disease measuring 1.4 cm, 1.2 cm, 0.1 cm. Tumor was ER +100% PR +11% HER-2/neu positive with a ratio 6.50. Ki-67 was 80% 8 sentinel nodes were negative for metastatic disease.  #5 patient has had immediate reconstruction with expanders bilaterally.  #6s/p adjuvant chemotherapy initially consisting of Adriamycin Cytoxan every 2 weeks for a total of 4 cycles. Given 07/02/12 - 08/12/12  #7This will then be followed by Taxol and Herceptin to be given weekly for 12 weeks time.beginning 08/27/12. This was discontinued due to neuropathy.  #8 Abraxane and Herceptin days 1, day 8, day 15 on a 28 day cycle. Beginning 09/03/2012 - 11/2012  #9 curative intent adjuvant Herceptin every 3 weeks beginning 01/05/2013  #10 curative intent adjuvant letrozole 2.5 mg daily starting 01/05/2013. Total of 5 years of therapy is planned   CURRENT THERAPY: adjuvant curative intent Herceptin every 3 weeks  INTERVAL HISTORY: [name] [name] 62 y.o. female returns for followup visit. overall patient is doing well. She tolerated the Herceptin quite nicely 3 weeks ago. She is on letrozole she is tolerating that as well. Patient denies any fevers chills night  sweats headaches shortness of breath chest pains palpitations no myalgias and arthralgias. She does have hot flashes.  Remainder of the template review of systems is negative. MEDICAL HISTORY: Past Medical History  Diagnosis Date  . Heart murmur   . Diabetes mellitus without complication   . Osteopenia   . Cancer     breast  . Bronchitis   . Pneumonia   . Hiatal hernia   . Sinus problem   . Diabetes mellitus   . Breast cancer 04/15/12    left-biopsy  . Breast cancer, right breast 05/14/12    right mastectomy    ALLERGIES:  is allergic to darvon and penicillins.  MEDICATIONS:  Current Outpatient Prescriptions  Medication Sig Dispense Refill  . Ascorbic Acid (VITAMIN C) 500 MG tablet Take 500 mg by mouth daily.        . Biotin 10 MG TABS Take by mouth. 4000 mcg daily      . calcitonin, salmon, (MIACALCIN/FORTICAL) 200 UNIT/ACT nasal spray Place 1 spray into the nose daily.      . Calcium Citrate (CITRACAL PO) Take by mouth.      . Cholecalciferol (VITAMIN D PO) Take 1 tablet by mouth daily.      . fluticasone (FLONASE) 50 MCG/ACT nasal spray Place 2 sprays into the nose daily.  16 [name]  2  . gabapentin (NEURONTIN) 100 MG capsule Take 1 capsule at bedtime  30 capsule  4  . KRILL OIL PO Take by mouth.      . letrozole (FEMARA) 2.5 MG tablet Take 1 tablet (2.5 mg total) by mouth daily.  90 tablet  12  . lidocaine-prilocaine (EMLA) cream Apply topically as needed.  30 [name]  6  . Multiple Vitamin (MULTIVITAMIN PO) Take 1 tablet by mouth daily.       . pantoprazole (PROTONIX) 40 MG tablet Take 40 mg by mouth daily.        . Probiotic Product (PROBIOTIC DAILY PO) Take by mouth daily.      . rosuvastatin (CRESTOR) 5 MG tablet Take 5 mg by mouth every Monday, Wednesday, and Friday.      . sitaGLIPtin (JANUVIA) 100 MG tablet Take 100 mg by mouth daily.      Marland Kitchen UNABLE TO FIND Apply 1 Device topically once. Cranial prosthesis r/t chemotherapy treatment  1 Device  0  . azithromycin (ZITHROMAX Z-PAK) 250 MG tablet Take 2 today then 1 a day until completed  6 each  1   No current facility-administered medications  for this visit.    SURGICAL HISTORY:  Past Surgical History  Procedure Laterality Date  . Cesarean section  1981, 1983  . Tummy tuck  1999  . Tonsillectomy    . Foot surgery    . Gastric bypass    . Wound debridement  01/14/2011    Procedure: DEBRIDEMENT ABDOMINAL WOUND;  Surgeon: Ernestene Mention, MD;  Location: [facility];  Service: General;  Laterality: N/A;  wound debridement and debridement on the abdomen  . Cosmetic surgery    . Total mastectomy Bilateral 05/14/2012    Procedure: TOTAL MASTECTOMY;  Surgeon: Ernestene Mention, MD;  Location: [facility] OR;  Service: General;  Laterality: Bilateral;  . Axillary sentinel node biopsy Left 05/14/2012    Procedure: AXILLARY SENTINEL NODE BIOPSY;  Surgeon: Ernestene Mention, MD;  Location: [facility] OR;  Service: General;  Laterality: Left;  . Portacath placement N/A 05/14/2012    Procedure: INSERTION PORT-A-CATH;  Surgeon: Angelia Mould  Derrell Lolling, MD;  Location: [facility] OR;  Service: General;  Laterality: N/A;  . Breast reconstruction with placement of tissue expander and flex hd (acellular hydrated dermis) Bilateral 05/14/2012    Procedure: BREAST RECONSTRUCTION WITH PLACEMENT OF TISSUE EXPANDER AND FLEX HD (ACELLULAR HYDRATED DERMIS);  Surgeon: Etter Sjogren, MD;  Location: [facility] OR;  Service: Plastics;  Laterality: Bilateral;  . Bunionectomy      REVIEW OF SYSTEMS:  Pertinent items are noted in HPI.   HEALTH MAINTENANCE:   PHYSICAL EXAMINATION: Blood pressure 136/85, pulse 81, temperature 98.3 F (36.8 C), temperature source Oral, resp. rate 18, height 5\' 4"  (1.626 m), weight 191 lb 4.8 oz (86.773 kg). Body mass index is 32.82 kg/(m^2). General: Patient is a well appearing female in no acute distress HEENT: PERRLA, sclerae anicteric no conjunctival pallor, MMM Neck: supple, no palpable adenopathy Lungs: clear to auscultation bilaterally, no wheezes, rhonchi, or rales Cardiovascular: regular rate rhythm, S1, S2, no murmurs, rubs or gallops Abdomen:  Soft, non-tender, non-distended, normoactive bowel sounds, no HSM Extremities: warm and well perfused, no clubbing, cyanosis, or edema Skin: No rashes or lesions Neuro: Non-focal ECOG PERFORMANCE STATUS: 0 - Asymptomatic  LABORATORY DATA: Lab Results  Component Value Date   WBC 3.5* 01/26/2013   HGB 12.5 01/26/2013   HCT 38.2 01/26/2013   MCV 83.2 01/26/2013   PLT 278 01/26/2013      Chemistry      Component Value Date/Time   NA 144 01/05/2013 0758   NA 139 05/17/2012 0530   K 4.0 01/05/2013 0758   K 3.7 05/17/2012 0530   CL 104 07/30/2012 0955   CL 100 05/17/2012 0530   CO2 30* 01/05/2013 0758   CO2 33* 05/17/2012 0530   BUN 7.6 01/05/2013 0758   BUN 4* 05/17/2012 0530   CREATININE 0.7 01/05/2013 0758   CREATININE 0.56 05/17/2012 0530      Component Value Date/Time   CALCIUM 10.0 01/05/2013 0758   CALCIUM 8.7 05/17/2012 0530   ALKPHOS 91 01/05/2013 0758   ALKPHOS 103 05/10/2012 1440   AST 16 01/05/2013 0758   AST 25 05/10/2012 1440   ALT 13 01/05/2013 0758   ALT 17 05/10/2012 1440   BILITOT 0.36 01/05/2013 0758   BILITOT 0.4 05/10/2012 1440    ADDITIONAL INFORMATION: 6. PROGNOSTIC INDICATORS - ACIS Results: IMMUNOHISTOCHEMICAL AND MORPHOMETRIC ANALYSIS BY THE AUTOMATED CELLULAR IMAGING SYSTEM (ACIS) Estrogen Receptor: 0%, NEGATIVE Progesterone Receptor: 0%, NEGATIVE Proliferation Marker Ki67: 92% COMMENT: The negative hormone receptor study(ies) in this case have an internal positive control. REFERENCE RANGE ESTROGEN RECEPTOR NEGATIVE <1% POSITIVE =>1% PROGESTERONE RECEPTOR NEGATIVE <1% POSITIVE =>1% All controls stained appropriately Pecola Leisure MD Pathologist, Electronic Signature ( Signed 05/21/2012) 6. CHROMOGENIC IN-SITU HYBRIDIZATION Results: HER-2/NEU BY CISH - NO AMPLIFICATION OF HER-2 DETECTED. 1 of 6 Duplicate copy FINAL for Leask, [name] [name] [phone]) ADDITIONAL INFORMATION:(continued) RESULT RATIO OF HER2: CEP 17 SIGNALS 1.19 AVERAGE HER2  COPY NUMBER PER CELL 1.85 REFERENCE RANGE NEGATIVE HER2/Chr17 Ratio <2.0 and Average HER2 copy number <4.0 EQUIVOCAL HER2/Chr17 Ratio <2.0 and Average HER2 copy number 4.0 and <6.0 POSITIVE HER2/Chr17 Ratio >=2.0 and/or Average HER2 copy number >=6.0 Pecola Leisure MD Pathologist, Electronic Signature ( Signed 05/20/2012) FINAL DIAGNOSIS Diagnosis 1. Lymph node, sentinel, biopsy, Left axillary - THERE IS NO EVIDENCE OF CARCINOMA IN 1 OF 1 LYMPH NODE (0/1). 2. Lymph node, sentinel, biopsy, Left axillary - THERE IS NO EVIDENCE OF CARCINOMA IN 1 OF 1 LYMPH NODE (0/1). 3. Lymph node, sentinel, biopsy, Left axillary -  THERE IS NO EVIDENCE OF CARCINOMA IN 1 OF 1 LYMPH NODE (0/1). 4. Lymph node, sentinel, biopsy, Left axillary - THERE IS NO EVIDENCE OF CARCINOMA IN 1 OF 1 LYMPH NODE (0/1). 5. Lymph node, sentinel, biopsy, Left axillary - THERE IS NO EVIDENCE OF CARCINOMA IN 1 OF 1 LYMPH NODE (0/1). 6. Breast, simple mastectomy, Right - INVASIVE DUCTAL CARCINOMA, GRADE III/III, SPANNING 1.2 CM. - DUCTAL CARCINOMA IN SITU, HIGH GRADE. - LYMPHOVASCULAR INVASION IS IDENTIFIED. - THERE IS NO EVIDENCE OF CARCINOMA IN 1 OF 1 LYMPH NODE (0/1). - SEE ONCOLOGY TABLE BELOW. 7. Breast, simple mastectomy, Left - INVASIVE DUCTAL CARCINOMA, AT LEAST THREE FOCI, GRADE II/III, SPANNING 1.4 CM, 1.2 CM, AND 0.1 CM. - DUCTAL CARCINOMA IN SITU, INTERMEDIATE GRADE. - LYMPHOVASCULAR INVASION IS IDENTIFIED. - DUCTAL CARCINOMA IN SITU IS FOCALLY 0.2 CM TO THE ANTERIOR/INFERIOR SOFT TISSUE RESECTION MARGIN. - LOBULAR NEOPLASIA (LOBULAR CARCINOMA IN SITU). - THERE IS NO EVIDENCE OF CARCINOMA IN 2 OF 2 LYMPH NODES (0/2). - SEE ONCOLOGY TABLE BELOW. 8. Lymph node, biopsy, Left axillary - THERE IS NO EVIDENCE OF CARCINOMA IN 1 OF 1 LYMPH NODE (0/1). Microscopic Comment 6. BREAST, INVASIVE TUMOR, WITH LYMPH NODE SAMPLING Specimen, including laterality: Right breast Procedure: Simple mastectomy Grade: 3 2 of  6 Duplicate copy FINAL for Bellmore, [name] [name] ([id number]) Microscopic Comment(continued) Tubule formation: 3 Nuclear pleomorphism: 3 Mitotic:2 Tumor size (gross measurement): 1.2 cm Margins: Invasive, distance to closest margin: 1.3 cm to the deep margin (gross measurement) In-situ, distance to closest margin: 1.3 cm to the deep margin (gross measurement) Lymphovascular invasion: Present Ductal carcinoma in situ: Present Grade: High grade Extensive intraductal component: Not identified Lobular neoplasia: Not identified in specimen #6 Tumor focality: Unifocal Treatment effect: N/A Extent of tumor: Confined to breast parenchyma Lymph nodes: # examined: 1 Lymph nodes with metastasis: 0 Breast prognostic profile: Will be performed on the current case and the results reported separately. TNM: pT1c, pN0 7. BREAST, INVASIVE TUMOR, WITH LYMPH NODE SAMPLING Specimen, including laterality: Left breast Procedure: Simple mastectomy Grade: 2 Tubule formation: 3 Nuclear pleomorphism: 2 Mitotic:2 Tumor size (gross measurements) 1.4 cm, 1.2 cm, and 0.1 cm Margins: Invasive, distance to closest margin: 0.7 cm to the anterior/inferior soft tissue resection margin In-situ, distance to closest margin: Focally 0.2 cm to the anterior/inferior soft tissue resection margin (glass slide measurement) Lymphovascular invasion: Present Ductal carcinoma in situ: Present Grade: Intermediate grade Extensive intraductal component: Yes Lobular neoplasia: Yes (lobular carcinoma in situ) Tumor focality: Multiple foci Treatment effect: N/A Extent of tumor: Confined to breast parenchyma Lymph nodes: # examined: 8 Lymph nodes with metastasis: 0 Breast prognostic profile: [phone] Estrogen receptor: 100% strong staining intensity Progesterone receptor: 11% strong staining intensity Her 2 neu: Amplification was detected. The ratio was 6.50. 3 of 6 Duplicate copy FINAL for Tani, [name] [name]  ([id number]) Microscopic Comment(continued) Ki-67: 80% TNM: mpT1c, pN0 Comments: In addition to the two grossly identified nodules, there is invasive carcinoma present in random tissue submitted from the inferior lateral quadrant. The three foci of carcinoma are morphologically similar.   RADIOGRAPHIC STUDIES:  Nm Pet Image Initial (pi) Skull Base To Thigh  05/12/2012  *RADIOLOGY REPORT*  Clinical Data: Initial treatment strategy for breast cancer.   NUCLEAR MEDICINE PET SKULL BASE TO THIGH  Fasting Blood Glucose:  143  Technique:  20 mCi F-18 FDG was injected intravenously. CT data was obtained and used for attenuation correction and anatomic localization only.  (This was not acquired as a diagnostic  CT examination.) Additional exam technical data entered on technologist worksheet.  Comparison:  none  Findings:  Neck: No hypermetabolic lymph nodes in the neck.  Chest:  In the inferior medial aspect of the right breast there is a 10 mm nodule (image 99) with moderate  metabolic activity ( SUV max = 2.9).  No hypermetabolic mediastinal or hilar lymph nodes.  There is hypermetabolic activity in the thoracic inlet which is felt to be vascular in nature.  There is activity adjacent to the thoracic aorta which is also felt to be vascular may relate to the hemiazygos vein.  Abdomen/Pelvis:  No abnormal hypermetabolic activity within the liver, pancreas, adrenal glands, or spleen.  No hypermetabolic lymph nodes in the abdomen or pelvis.  Skeleton:  No focal hypermetabolic activity to suggest skeletal metastasis.  IMPRESSION:  1.  No evidence of distant breast cancer metastasis. 2.  Hypermetabolic nodule within the inferior medial right breast   Original Report Authenticated By: Genevive Bi, M.D.    Nm Sentinel Node Inj-no Rpt (breast)  05/14/2012  CLINICAL DATA: cancer left breast   Sulfur colloid was injected intradermally by the nuclear medicine  technologist for breast cancer sentinel node  localization.     Dg Chest Port 1 View  05/14/2012  *RADIOLOGY REPORT*  Clinical Data: Status post Port-A-Cath placement  PORTABLE CHEST - 1 VIEW  Comparison: None.  Findings: The cardiac shadow is within normal limits.  Bilateral tissue expanders as well as surgical drains are identified.  A right chest wall port is seen. The catheter tip is at the cavoatrial junction.  No pneumothorax is noted.  The lungs are clear bilaterally.  IMPRESSION: No evidence of pneumothorax following port placement.  Bilateral tissue expanders.   Original Report Authenticated By: Alcide Clever, M.D.    Dg Fluoro Guide Cv Line-no Report  05/14/2012  CLINICAL DATA: LEFT BREAST CANCER/PORT-A-CATH   FLOURO GUIDE CV LINE  Fluoroscopy was utilized by the requesting physician.  No radiographic  interpretation.      ASSESSMENT: 62 year old female with  #1 bilateral breast cancers on the right side she has triple-negative disease stage I left side stage I ER positive PR positive HER-2/neu positive disease. Patient is status post bilateral mastectomies. Of note on the right side the lymph node removed was not a sentinel lymph node. It is unclear what to do with this side in terms of whether or not to do further sentinel lymph node. Clinically patient seems to be doing well.  #2status post Adriamycin Cytoxan given every 2 weeks for a total of 4 cycles followed by Taxol and Herceptin given weekly, however after 2-3 cycles patient could not tolerate the Taxol and therefore her treatment was changed to Abraxane and Herceptin. She completed Abraxane Herceptin combination on 10 /2014.  #3 patient to begin adjuvant antiestrogen therapy consisting of Arimidex 1 mg daily starting 01/05/2013. Total of 5 years of therapy is planned.  #4 patient will also begin adjuvant Herceptin every 3 weeks to finish out a total of one year of therapy.  PLAN:  #1 proceed with Herceptin infusion today.  #2 patient will also begin letrozole mg daily  risks benefits and side effects of treatment were discussed with both patient and her husband in detail.  #3 proceed with echocardiogram for EF follow up  4 patient is recommended to continue taking gabapentin 100 mg at nighttime for hot flashes  #4 we will see patient back in 3 weeks  All questions were answered. The patient knows  to call the clinic with any problems, questions or concerns. We can certainly see the patient much sooner if necessary.  I spent 25 minutes counseling the patient face to face. The total time spent in the appointment was 30 minutes.  Drue Second, MD Medical/Oncology [facility] [phone] (beeper) [phone] (Office)

## 2013-01-26 NOTE — Telephone Encounter (Signed)
appts made and printed. Pt is aware that tx's will be added. i emailed MW to add the tx's...td 

## 2013-02-11 ENCOUNTER — Ambulatory Visit: Payer: BC Managed Care – PPO | Admitting: Oncology

## 2013-02-11 ENCOUNTER — Other Ambulatory Visit: Payer: BC Managed Care – PPO | Admitting: Lab

## 2013-02-11 ENCOUNTER — Ambulatory Visit: Payer: BC Managed Care – PPO

## 2013-02-15 ENCOUNTER — Other Ambulatory Visit (HOSPITAL_BASED_OUTPATIENT_CLINIC_OR_DEPARTMENT_OTHER): Payer: BC Managed Care – PPO

## 2013-02-15 ENCOUNTER — Other Ambulatory Visit: Payer: BC Managed Care – PPO | Admitting: Lab

## 2013-02-15 ENCOUNTER — Telehealth: Payer: Self-pay | Admitting: Oncology

## 2013-02-15 ENCOUNTER — Ambulatory Visit (HOSPITAL_BASED_OUTPATIENT_CLINIC_OR_DEPARTMENT_OTHER): Payer: BC Managed Care – PPO | Admitting: Adult Health

## 2013-02-15 ENCOUNTER — Encounter: Payer: Self-pay | Admitting: Adult Health

## 2013-02-15 ENCOUNTER — Ambulatory Visit: Payer: BC Managed Care – PPO | Admitting: Oncology

## 2013-02-15 ENCOUNTER — Telehealth: Payer: Self-pay | Admitting: *Deleted

## 2013-02-15 ENCOUNTER — Ambulatory Visit: Payer: BC Managed Care – PPO

## 2013-02-15 VITALS — BP 146/82 | HR 89 | Temp 98.1°F | Resp 18 | Ht 64.0 in | Wt 191.2 lb

## 2013-02-15 DIAGNOSIS — C50919 Malignant neoplasm of unspecified site of unspecified female breast: Secondary | ICD-10-CM

## 2013-02-15 DIAGNOSIS — C50911 Malignant neoplasm of unspecified site of right female breast: Secondary | ICD-10-CM

## 2013-02-15 DIAGNOSIS — C50119 Malignant neoplasm of central portion of unspecified female breast: Secondary | ICD-10-CM

## 2013-02-15 DIAGNOSIS — Z170 Estrogen receptor positive status [ER+]: Secondary | ICD-10-CM

## 2013-02-15 DIAGNOSIS — E2839 Other primary ovarian failure: Secondary | ICD-10-CM

## 2013-02-15 LAB — COMPREHENSIVE METABOLIC PANEL (CC13)
ALT: 18 U/L (ref 0–55)
AST: 18 U/L (ref 5–34)
Albumin: 3.9 g/dL (ref 3.5–5.0)
Alkaline Phosphatase: 102 U/L (ref 40–150)
Anion Gap: 14 mEq/L — ABNORMAL HIGH (ref 3–11)
BUN: 14.1 mg/dL (ref 7.0–26.0)
CO2: 27 mEq/L (ref 22–29)
Calcium: 9.9 mg/dL (ref 8.4–10.4)
Chloride: 107 mEq/L (ref 98–109)
Creatinine: 0.8 mg/dL (ref 0.6–1.1)
Glucose: 110 mg/dl (ref 70–140)
Potassium: 4.1 mEq/L (ref 3.5–5.1)
Sodium: 147 mEq/L — ABNORMAL HIGH (ref 136–145)
Total Bilirubin: 0.31 mg/dL (ref 0.20–1.20)
Total Protein: 7.1 g/dL (ref 6.4–8.3)

## 2013-02-15 LAB — CBC WITH DIFFERENTIAL/PLATELET
BASO%: 0.2 % (ref 0.0–2.0)
Basophils Absolute: 0 10*3/uL (ref 0.0–0.1)
EOS%: 1.6 % (ref 0.0–7.0)
Eosinophils Absolute: 0.1 10*3/uL (ref 0.0–0.5)
HCT: 38.8 % (ref 34.8–46.6)
HGB: 12.3 g/dL (ref 11.6–15.9)
LYMPH%: 20.7 % (ref 14.0–49.7)
MCH: 26.6 pg (ref 25.1–34.0)
MCHC: 31.7 g/dL (ref 31.5–36.0)
MCV: 84 fL (ref 79.5–101.0)
MONO#: 0.6 10*3/uL (ref 0.1–0.9)
MONO%: 10.8 % (ref 0.0–14.0)
NEUT#: 3.6 10*3/uL (ref 1.5–6.5)
NEUT%: 66.7 % (ref 38.4–76.8)
Platelets: 256 10*3/uL (ref 145–400)
RBC: 4.62 10*6/uL (ref 3.70–5.45)
RDW: 15.9 % — ABNORMAL HIGH (ref 11.2–14.5)
WBC: 5.5 10*3/uL (ref 3.9–10.3)
lymph#: 1.1 10*3/uL (ref 0.9–3.3)

## 2013-02-15 NOTE — Telephone Encounter (Signed)
, °

## 2013-02-15 NOTE — Telephone Encounter (Signed)
appt for echo made and printed...td 

## 2013-02-15 NOTE — Progress Notes (Signed)
OFFICE PROGRESS NOTE  CC  [name], MD [address] [place] [address] Dr. 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 Patient is status post bilateral mastectomy with immediate reconstruction  STAGE:  Right breast (mastectomy)  T1cN0 1.2 cm ER-/PR-/her-/ki-67 92% 0/1 nodes positive  Left breast (mastectomy with snl) T1cN0 1.4 cm, 1.2 cm, 0.1c ER 100%, PR 11%, her2neu positive 6.50, Ki-67 80%   PRIOR THERAPY:  #1screening mammogram performed that showed 2 areas of concern on the left side.calcifications were noted in the left. In the right breast a possible mass warranted further evaluation.   #2 On 03/29/2012 patient underwent a bilateral diagnostic mammogram and right breast ultrasound. A spot magnification images demonstrate suspicious group of pleomorphic microcalcifications over the outer lower left breast with additional suspicious group of pleomorphic microcalcifications over the outer midportion of the left periareolar region. Spot compression images of the right breast demonstrate persistence of a 1 cm density at the edge of the film in the deep third of the right inner breast.   #3Ultrasound performed showed no focal abnormality over the entire in her right breast to correspond to the mammographic density. Patient was recommended stereotactic core needle biopsy of the 2 groups of suspicious left breast microcalcifications. Because patient and her husband wanted bilateral mastectomies MRI was not performed.on 04/15/2012 patient had needle core biopsy performed of the 2 areas in the left breast. The left needle core biopsy in the lower breast revealed ductal carcinoma in situ with associated comedo necrosis and calcifications with the in situ carcinoma. It was ER +100% PR +12%. The subareolar  needle core biopsy of the left breast revealed invasive ductal carcinoma grade 2-3 with DCIS. Tumor was ER +100% PR +11% proliferation marker Ki-67 elevated at 80% HER-2/neu showed amplification with a ratio of 6.50.   #4 patient is now status post bilateral mastectomies performed on 05/14/2012 with immediate reconstruction. The final pathology revealed bilateral breast cancers. In the right breast tissue is noted to have a 1.2 cm invasive ductal carcinoma high-grade ER negative PR negative HER-2/neu negative with Ki-67 92% one node was negative for metastatic disease but this was not the sentinel lymph node. On the left side the patient had multifocal disease measuring 1.4 cm, 1.2 cm, 0.1 cm. Tumor was ER +100% PR +11% HER-2/neu positive with a ratio 6.50. Ki-67 was 80% 8 sentinel nodes were negative for metastatic disease.  #5 patient has had immediate reconstruction with expanders bilaterally.  #6s/p adjuvant chemotherapy initially consisting of Adriamycin Cytoxan every 2 weeks for a total of 4 cycles. Given 07/02/12 - 08/12/12  #7This will then be followed by Taxol and Herceptin to be given weekly for 12 weeks time.beginning 08/27/12. This was discontinued due to neuropathy.  #8 Abraxane and Herceptin days 1, day 8, day 15 on a 28 day cycle. Beginning 09/03/2012 - 11/2012  #9 curative intent adjuvant Herceptin every 3 weeks beginning 01/05/2013  #10 curative intent adjuvant letrozole 2.5 mg daily starting 01/05/2013. Total of 5 years of therapy is planned   CURRENT THERAPY: adjuvant curative intent Herceptin every 3 weeks, daily Letrozole  INTERVAL HISTORY: [name] [name] 62 y.o. female returns for followup visit. overall patient is doing well. She is taking the Letrozole daily. She does endorse hot flashes, mild joint aches, and vaginal dryness relieved with lubricant.  Her last  bone density was in 2008.  She has not had an echo since July 2014.  She denies any chest pain, palpitations,  fatigue, DOE, PND, orthopnea, or any further concerns.    MEDICAL HISTORY: Past Medical History  Diagnosis Date  . Heart murmur   . Diabetes mellitus without complication   . Osteopenia   . Cancer     breast  . Bronchitis   . Pneumonia   . Hiatal hernia   . Sinus problem   . Diabetes mellitus   . Breast cancer 04/15/12    left-biopsy  . Breast cancer, right breast 05/14/12    right mastectomy    ALLERGIES:  is allergic to darvon and penicillins.  MEDICATIONS:  Current Outpatient Prescriptions  Medication Sig Dispense Refill  . Ascorbic Acid (VITAMIN C) 500 MG tablet Take 500 mg by mouth daily.        Marland Kitchen azithromycin (ZITHROMAX Z-PAK) 250 MG tablet Take 2 today then 1 a day until completed  6 each  1  . Biotin 10 MG TABS Take by mouth. 4000 mcg daily      . calcitonin, salmon, (MIACALCIN/FORTICAL) 200 UNIT/ACT nasal spray Place 1 spray into the nose daily.      . Calcium Citrate (CITRACAL PO) Take by mouth.      . Cholecalciferol (VITAMIN D PO) Take 1 tablet by mouth daily.      Marland Kitchen KRILL OIL PO Take by mouth.      . letrozole (FEMARA) 2.5 MG tablet Take 1 tablet (2.5 mg total) by mouth daily.  90 tablet  12  . lidocaine-prilocaine (EMLA) cream Apply topically as needed.  30 g  6  . Multiple Vitamin (MULTIVITAMIN PO) Take 1 tablet by mouth daily.       . pantoprazole (PROTONIX) 40 MG tablet Take 40 mg by mouth daily.        . Probiotic Product (PROBIOTIC DAILY PO) Take by mouth daily.      . rosuvastatin (CRESTOR) 5 MG tablet Take 5 mg by mouth every Monday, Wednesday, and Friday.      . sitaGLIPtin (JANUVIA) 100 MG tablet Take 100 mg by mouth daily.      . fluticasone (FLONASE) 50 MCG/ACT nasal spray Place 2 sprays into the nose daily.  16 g  2  . gabapentin (NEURONTIN) 100 MG capsule Take 1 capsule at bedtime  30 capsule  4  . UNABLE TO FIND Apply 1 Device topically once. Cranial prosthesis r/t chemotherapy treatment  1 Device  0   No current facility-administered medications  for this visit.    SURGICAL HISTORY:  Past Surgical History  Procedure Laterality Date  . Cesarean section  1981, 1983  . Tummy tuck  1999  . Tonsillectomy    . Foot surgery    . Gastric bypass    . Wound debridement  01/14/2011    Procedure: DEBRIDEMENT ABDOMINAL WOUND;  Surgeon: Ernestene Mention, MD;  Location: [facility];  Service: General;  Laterality: N/A;  wound debridement and debridement on the abdomen  . Cosmetic surgery    . Total mastectomy Bilateral 05/14/2012    Procedure: TOTAL MASTECTOMY;  Surgeon: Ernestene Mention, MD;  Location: [facility] OR;  Service: General;  Laterality: Bilateral;  . Axillary sentinel node biopsy Left 05/14/2012    Procedure: AXILLARY SENTINEL NODE BIOPSY;  Surgeon: Ernestene Mention, MD;  Location: [facility] OR;  Service: General;  Laterality: Left;  . Portacath placement N/A 05/14/2012  Procedure: INSERTION PORT-A-CATH;  Surgeon: Ernestene Mention, MD;  Location: [facility] OR;  Service: General;  Laterality: N/A;  . Breast reconstruction with placement of tissue expander and flex hd (acellular hydrated dermis) Bilateral 05/14/2012    Procedure: BREAST RECONSTRUCTION WITH PLACEMENT OF TISSUE EXPANDER AND FLEX HD (ACELLULAR HYDRATED DERMIS);  Surgeon: Etter Sjogren, MD;  Location: [facility] OR;  Service: Plastics;  Laterality: Bilateral;  . Bunionectomy      REVIEW OF SYSTEMS:  A 10 point review of systems was conducted and is otherwise negative except for what is noted above.    PHYSICAL EXAMINATION: Blood pressure 146/82, pulse 89, temperature 98.1 F (36.7 C), temperature source Oral, resp. rate 18, height 5\' 4"  (1.626 m), weight 191 lb 3.2 oz (86.728 kg). Body mass index is 32.8 kg/(m^2). GENERAL: Patient is a well appearing female in no acute distress HEENT:  Sclerae anicteric.  Oropharynx clear and moist. No ulcerations or evidence of oropharyngeal candidiasis. Neck is supple.  NODES:  No cervical, supraclavicular, or axillary lymphadenopathy palpated.   BREAST EXAM:  Deferred. LUNGS:  Clear to auscultation bilaterally.  No wheezes or rhonchi. HEART:  Regular rate and rhythm. No murmur appreciated. ABDOMEN:  Soft, nontender.  Positive, normoactive bowel sounds. No organomegaly palpated. MSK:  No focal spinal tenderness to palpation. Full range of motion bilaterally in the upper extremities. EXTREMITIES:  No peripheral edema.   SKIN:  Clear with no obvious rashes or skin changes. No nail dyscrasia. NEURO:  Nonfocal. Well oriented.  Appropriate affect. ECOG PERFORMANCE STATUS: 0 - Asymptomatic  LABORATORY DATA: Lab Results  Component Value Date   WBC 5.5 02/15/2013   HGB 12.3 02/15/2013   HCT 38.8 02/15/2013   MCV 84.0 02/15/2013   PLT 256 02/15/2013      Chemistry      Component Value Date/Time   NA 143 01/26/2013 0803   NA 139 05/17/2012 0530   K 4.7 01/26/2013 0803   K 3.7 05/17/2012 0530   CL 104 07/30/2012 0955   CL 100 05/17/2012 0530   CO2 27 01/26/2013 0803   CO2 33* 05/17/2012 0530   BUN 13.3 01/26/2013 0803   BUN 4* 05/17/2012 0530   CREATININE 0.7 01/26/2013 0803   CREATININE 0.56 05/17/2012 0530      Component Value Date/Time   CALCIUM 10.2 01/26/2013 0803   CALCIUM 8.7 05/17/2012 0530   ALKPHOS 94 01/26/2013 0803   ALKPHOS 103 05/10/2012 1440   AST 21 01/26/2013 0803   AST 25 05/10/2012 1440   ALT 19 01/26/2013 0803   ALT 17 05/10/2012 1440   BILITOT 0.40 01/26/2013 0803   BILITOT 0.4 05/10/2012 1440    ADDITIONAL INFORMATION: 6. PROGNOSTIC INDICATORS - ACIS Results: IMMUNOHISTOCHEMICAL AND MORPHOMETRIC ANALYSIS BY THE AUTOMATED CELLULAR IMAGING SYSTEM (ACIS) Estrogen Receptor: 0%, NEGATIVE Progesterone Receptor: 0%, NEGATIVE Proliferation Marker Ki67: 92% COMMENT: The negative hormone receptor study(ies) in this case have an internal positive control. REFERENCE RANGE ESTROGEN RECEPTOR NEGATIVE <1% POSITIVE =>1% PROGESTERONE RECEPTOR NEGATIVE <1% POSITIVE =>1% All controls stained appropriately Pecola Leisure MD Pathologist, Electronic Signature ( Signed 05/21/2012) 6. CHROMOGENIC IN-SITU HYBRIDIZATION Results: HER-2/NEU BY CISH - NO AMPLIFICATION OF HER-2 DETECTED. 1 of 6 Duplicate copy FINAL for [place], [place] G [phone]) ADDITIONAL INFORMATION:(continued) RESULT RATIO OF HER2: CEP 17 SIGNALS 1.19 AVERAGE HER2 COPY NUMBER PER CELL 1.85 REFERENCE RANGE NEGATIVE HER2/Chr17 Ratio <2.0 and Average HER2 copy number <4.0 EQUIVOCAL HER2/Chr17 Ratio <2.0 and Average HER2 copy number 4.0 and <6.0 POSITIVE HER2/Chr17 Ratio >=2.0  and/or Average HER2 copy number >=6.0 Pecola Leisure MD Pathologist, Electronic Signature ( Signed 05/20/2012) FINAL DIAGNOSIS Diagnosis 1. Lymph node, sentinel, biopsy, Left axillary - THERE IS NO EVIDENCE OF CARCINOMA IN 1 OF 1 LYMPH NODE (0/1). 2. Lymph node, sentinel, biopsy, Left axillary - THERE IS NO EVIDENCE OF CARCINOMA IN 1 OF 1 LYMPH NODE (0/1). 3. Lymph node, sentinel, biopsy, Left axillary - THERE IS NO EVIDENCE OF CARCINOMA IN 1 OF 1 LYMPH NODE (0/1). 4. Lymph node, sentinel, biopsy, Left axillary - THERE IS NO EVIDENCE OF CARCINOMA IN 1 OF 1 LYMPH NODE (0/1). 5. Lymph node, sentinel, biopsy, Left axillary - THERE IS NO EVIDENCE OF CARCINOMA IN 1 OF 1 LYMPH NODE (0/1). 6. Breast, simple mastectomy, Right - INVASIVE DUCTAL CARCINOMA, GRADE III/III, SPANNING 1.2 CM. - DUCTAL CARCINOMA IN SITU, HIGH GRADE. - LYMPHOVASCULAR INVASION IS IDENTIFIED. - THERE IS NO EVIDENCE OF CARCINOMA IN 1 OF 1 LYMPH NODE (0/1). - SEE ONCOLOGY TABLE BELOW. 7. Breast, simple mastectomy, Left - INVASIVE DUCTAL CARCINOMA, AT LEAST THREE FOCI, GRADE II/III, SPANNING 1.4 CM, 1.2 CM, AND 0.1 CM. - DUCTAL CARCINOMA IN SITU, INTERMEDIATE GRADE. - LYMPHOVASCULAR INVASION IS IDENTIFIED. - DUCTAL CARCINOMA IN SITU IS FOCALLY 0.2 CM TO THE ANTERIOR/INFERIOR SOFT TISSUE RESECTION MARGIN. - LOBULAR NEOPLASIA (LOBULAR CARCINOMA IN SITU). - THERE IS NO EVIDENCE OF CARCINOMA IN  2 OF 2 LYMPH NODES (0/2). - SEE ONCOLOGY TABLE BELOW. 8. Lymph node, biopsy, Left axillary - THERE IS NO EVIDENCE OF CARCINOMA IN 1 OF 1 LYMPH NODE (0/1). Microscopic Comment 6. BREAST, INVASIVE TUMOR, WITH LYMPH NODE SAMPLING Specimen, including laterality: Right breast Procedure: Simple mastectomy Grade: 3 2 of 6 Duplicate copy FINAL for [place], [place] G ([id number]) Microscopic Comment(continued) Tubule formation: 3 Nuclear pleomorphism: 3 Mitotic:2 Tumor size (gross measurement): 1.2 cm Margins: Invasive, distance to closest margin: 1.3 cm to the deep margin (gross measurement) In-situ, distance to closest margin: 1.3 cm to the deep margin (gross measurement) Lymphovascular invasion: Present Ductal carcinoma in situ: Present Grade: High grade Extensive intraductal component: Not identified Lobular neoplasia: Not identified in specimen #6 Tumor focality: Unifocal Treatment effect: N/A Extent of tumor: Confined to breast parenchyma Lymph nodes: # examined: 1 Lymph nodes with metastasis: 0 Breast prognostic profile: Will be performed on the current case and the results reported separately. TNM: pT1c, pN0 7. BREAST, INVASIVE TUMOR, WITH LYMPH NODE SAMPLING Specimen, including laterality: Left breast Procedure: Simple mastectomy Grade: 2 Tubule formation: 3 Nuclear pleomorphism: 2 Mitotic:2 Tumor size (gross measurements) 1.4 cm, 1.2 cm, and 0.1 cm Margins: Invasive, distance to closest margin: 0.7 cm to the anterior/inferior soft tissue resection margin In-situ, distance to closest margin: Focally 0.2 cm to the anterior/inferior soft tissue resection margin (glass slide measurement) Lymphovascular invasion: Present Ductal carcinoma in situ: Present Grade: Intermediate grade Extensive intraductal component: Yes Lobular neoplasia: Yes (lobular carcinoma in situ) Tumor focality: Multiple foci Treatment effect: N/A Extent of tumor: Confined to breast  parenchyma Lymph nodes: # examined: 8 Lymph nodes with metastasis: 0 Breast prognostic profile: [phone] Estrogen receptor: 100% strong staining intensity Progesterone receptor: 11% strong staining intensity Her 2 neu: Amplification was detected. The ratio was 6.50. 3 of 6 Duplicate copy FINAL for [place], [place] G ([id number]) Microscopic Comment(continued) Ki-67: 80% TNM: mpT1c, pN0 Comments: In addition to the two grossly identified nodules, there is invasive carcinoma present in random tissue submitted from the inferior lateral quadrant. The three foci of carcinoma are morphologically similar.   RADIOGRAPHIC STUDIES:  Nm  Pet Image Initial (pi) Skull Base To Thigh  05/12/2012  *RADIOLOGY REPORT*  Clinical Data: Initial treatment strategy for breast cancer.   NUCLEAR MEDICINE PET SKULL BASE TO THIGH  Fasting Blood Glucose:  143  Technique:  20 mCi F-18 FDG was injected intravenously. CT data was obtained and used for attenuation correction and anatomic localization only.  (This was not acquired as a diagnostic CT examination.) Additional exam technical data entered on technologist worksheet.  Comparison:  none  Findings:  Neck: No hypermetabolic lymph nodes in the neck.  Chest:  In the inferior medial aspect of the right breast there is a 10 mm nodule (image 99) with moderate  metabolic activity ( SUV max = 2.9).  No hypermetabolic mediastinal or hilar lymph nodes.  There is hypermetabolic activity in the thoracic inlet which is felt to be vascular in nature.  There is activity adjacent to the thoracic aorta which is also felt to be vascular may relate to the hemiazygos vein.  Abdomen/Pelvis:  No abnormal hypermetabolic activity within the liver, pancreas, adrenal glands, or spleen.  No hypermetabolic lymph nodes in the abdomen or pelvis.  Skeleton:  No focal hypermetabolic activity to suggest skeletal metastasis.  IMPRESSION:  1.  No evidence of distant breast cancer metastasis. 2.   Hypermetabolic nodule within the inferior medial right breast   Original Report Authenticated By: Genevive Bi, M.D.    Nm Sentinel Node Inj-no Rpt (breast)  05/14/2012  CLINICAL DATA: cancer left breast   Sulfur colloid was injected intradermally by the nuclear medicine  technologist for breast cancer sentinel node localization.     Dg Chest Port 1 View  05/14/2012  *RADIOLOGY REPORT*  Clinical Data: Status post Port-A-Cath placement  PORTABLE CHEST - 1 VIEW  Comparison: None.  Findings: The cardiac shadow is within normal limits.  Bilateral tissue expanders as well as surgical drains are identified.  A right chest wall port is seen. The catheter tip is at the cavoatrial junction.  No pneumothorax is noted.  The lungs are clear bilaterally.  IMPRESSION: No evidence of pneumothorax following port placement.  Bilateral tissue expanders.   Original Report Authenticated By: Alcide Clever, M.D.    Dg Fluoro Guide Cv Line-no Report  05/14/2012  CLINICAL DATA: LEFT BREAST CANCER/PORT-A-CATH   FLOURO GUIDE CV LINE  Fluoroscopy was utilized by the requesting physician.  No radiographic  interpretation.      ASSESSMENT: 62 year old female with  #1 bilateral breast cancers on the right side she has triple-negative disease stage I left side stage I ER positive PR positive HER-2/neu positive disease. Patient is status post bilateral mastectomies. Of note on the right side the lymph node removed was not a sentinel lymph node. It is unclear what to do with this side in terms of whether or not to do further sentinel lymph node. Clinically patient seems to be doing well.  #2status post Adriamycin Cytoxan given every 2 weeks for a total of 4 cycles followed by Taxol and Herceptin given weekly, however after 2-3 cycles patient could not tolerate the Taxol and therefore her treatment was changed to Abraxane and Herceptin. She completed Abraxane Herceptin combination on 10 /2014.  #3 patient to begin adjuvant  antiestrogen therapy consisting of Letrozole 2.5 mg daily starting 01/05/2013. Total of 5 years of therapy is planned.  #4 patient will also begin adjuvant Herceptin every 3 weeks to finish out a total of one year of therapy.  PLAN:  #1 Patient is doing well today.  She will not receive Herceptin today, due to the fact that her last echocardiogram was in July, 2014.  She is asymptomatic, but in reviewing this case with Dr. Darnelle Catalan, he agrees that holding it is the safe thing to do for the patient until the echo is performed.  I reviewed this with the patient and she and her husband understand and are in agreement.    #2 She has not had a bone density in several years.  I will order one today.  She will continue taking Letrozole daily.  She is tolerating it moderately well.    #3  She will return in 3 weeks for labs, evaluation, and Herceptin therapy.    All questions were answered. The patient knows to call the clinic with any problems, questions or concerns. We can certainly see the patient much sooner if necessary.  I spent 25 minutes counseling the patient face to face. The total time spent in the appointment was 30 minutes.  Illa Level, NP Medical Oncology [facility] [phone]

## 2013-02-16 ENCOUNTER — Other Ambulatory Visit (HOSPITAL_COMMUNITY): Payer: BC Managed Care – PPO

## 2013-03-01 ENCOUNTER — Ambulatory Visit (HOSPITAL_COMMUNITY)
Admission: RE | Admit: 2013-03-01 | Discharge: 2013-03-01 | Disposition: A | Discharge status: Home / Self Care | Payer: BC Managed Care – PPO | Source: Ambulatory Visit | Attending: Adult Health | Admitting: Adult Health

## 2013-03-01 ENCOUNTER — Ambulatory Visit (HOSPITAL_COMMUNITY): Payer: BC Managed Care – PPO | Attending: Oncology | Admitting: Cardiology

## 2013-03-01 ENCOUNTER — Encounter: Payer: Self-pay | Admitting: Cardiovascular Disease

## 2013-03-01 DIAGNOSIS — R0609 Other forms of dyspnea: Secondary | ICD-10-CM | POA: Insufficient documentation

## 2013-03-01 DIAGNOSIS — C50911 Malignant neoplasm of unspecified site of right female breast: Secondary | ICD-10-CM

## 2013-03-01 DIAGNOSIS — E2839 Other primary ovarian failure: Secondary | ICD-10-CM

## 2013-03-01 DIAGNOSIS — E119 Type 2 diabetes mellitus without complications: Secondary | ICD-10-CM | POA: Insufficient documentation

## 2013-03-01 DIAGNOSIS — Z780 Asymptomatic menopausal state: Secondary | ICD-10-CM | POA: Insufficient documentation

## 2013-03-01 DIAGNOSIS — R011 Cardiac murmur, unspecified: Secondary | ICD-10-CM | POA: Insufficient documentation

## 2013-03-01 DIAGNOSIS — Z13820 Encounter for screening for osteoporosis: Secondary | ICD-10-CM | POA: Insufficient documentation

## 2013-03-01 DIAGNOSIS — R0602 Shortness of breath: Secondary | ICD-10-CM

## 2013-03-01 DIAGNOSIS — R0989 Other specified symptoms and signs involving the circulatory and respiratory systems: Secondary | ICD-10-CM | POA: Insufficient documentation

## 2013-03-01 DIAGNOSIS — C50919 Malignant neoplasm of unspecified site of unspecified female breast: Secondary | ICD-10-CM | POA: Insufficient documentation

## 2013-03-01 NOTE — Progress Notes (Signed)
Echo performed. 

## 2013-03-09 ENCOUNTER — Ambulatory Visit: Payer: BC Managed Care – PPO

## 2013-03-09 ENCOUNTER — Telehealth: Payer: Self-pay | Admitting: *Deleted

## 2013-03-09 ENCOUNTER — Telehealth: Payer: Self-pay | Admitting: Oncology

## 2013-03-09 ENCOUNTER — Encounter: Payer: Self-pay | Admitting: Oncology

## 2013-03-09 ENCOUNTER — Ambulatory Visit (HOSPITAL_BASED_OUTPATIENT_CLINIC_OR_DEPARTMENT_OTHER): Payer: BC Managed Care – PPO | Admitting: Oncology

## 2013-03-09 ENCOUNTER — Encounter (HOSPITAL_COMMUNITY)
Admission: RE | Admit: 2013-03-09 | Discharge: 2013-03-09 | Disposition: A | Discharge status: Home / Self Care | Payer: BC Managed Care – PPO | Source: Ambulatory Visit | Attending: Oncology | Admitting: Oncology

## 2013-03-09 ENCOUNTER — Other Ambulatory Visit (HOSPITAL_BASED_OUTPATIENT_CLINIC_OR_DEPARTMENT_OTHER): Payer: BC Managed Care – PPO

## 2013-03-09 VITALS — BP 142/86 | HR 84 | Temp 98.7°F | Resp 18 | Ht 64.0 in | Wt 195.0 lb

## 2013-03-09 DIAGNOSIS — C50919 Malignant neoplasm of unspecified site of unspecified female breast: Secondary | ICD-10-CM | POA: Insufficient documentation

## 2013-03-09 DIAGNOSIS — I509 Heart failure, unspecified: Secondary | ICD-10-CM | POA: Insufficient documentation

## 2013-03-09 DIAGNOSIS — C50911 Malignant neoplasm of unspecified site of right female breast: Secondary | ICD-10-CM

## 2013-03-09 HISTORY — DX: Heart failure, unspecified: I50.9

## 2013-03-09 LAB — COMPREHENSIVE METABOLIC PANEL (CC13)
ALT: 19 U/L (ref 0–55)
AST: 20 U/L (ref 5–34)
Albumin: 3.7 g/dL (ref 3.5–5.0)
Alkaline Phosphatase: 91 U/L (ref 40–150)
Anion Gap: 12 mEq/L — ABNORMAL HIGH (ref 3–11)
BUN: 15.8 mg/dL (ref 7.0–26.0)
CO2: 30 mEq/L — ABNORMAL HIGH (ref 22–29)
Calcium: 10.3 mg/dL (ref 8.4–10.4)
Chloride: 105 mEq/L (ref 98–109)
Creatinine: 0.7 mg/dL (ref 0.6–1.1)
Glucose: 130 mg/dl (ref 70–140)
Potassium: 4.8 mEq/L (ref 3.5–5.1)
Sodium: 146 mEq/L — ABNORMAL HIGH (ref 136–145)
Total Bilirubin: 0.44 mg/dL (ref 0.20–1.20)
Total Protein: 6.8 g/dL (ref 6.4–8.3)

## 2013-03-09 LAB — CBC WITH DIFFERENTIAL/PLATELET
BASO%: 0.4 % (ref 0.0–2.0)
Basophils Absolute: 0 10*3/uL (ref 0.0–0.1)
EOS%: 2 % (ref 0.0–7.0)
Eosinophils Absolute: 0.1 10*3/uL (ref 0.0–0.5)
HCT: 39.3 % (ref 34.8–46.6)
HGB: 12.5 g/dL (ref 11.6–15.9)
LYMPH%: 27.9 % (ref 14.0–49.7)
MCH: 26.8 pg (ref 25.1–34.0)
MCHC: 31.8 g/dL (ref 31.5–36.0)
MCV: 84.3 fL (ref 79.5–101.0)
MONO#: 0.5 10*3/uL (ref 0.1–0.9)
MONO%: 10.5 % (ref 0.0–14.0)
NEUT#: 2.7 10*3/uL (ref 1.5–6.5)
NEUT%: 59.2 % (ref 38.4–76.8)
Platelets: 275 10*3/uL (ref 145–400)
RBC: 4.66 10*6/uL (ref 3.70–5.45)
RDW: 16.1 % — ABNORMAL HIGH (ref 11.2–14.5)
WBC: 4.6 10*3/uL (ref 3.9–10.3)
lymph#: 1.3 10*3/uL (ref 0.9–3.3)

## 2013-03-09 MED ORDER — TECHNETIUM TC 99M-LABELED RED BLOOD CELLS IV KIT
23.0000 | PACK | Freq: Once | INTRAVENOUS | Status: AC | PRN
  Administered 2013-03-09: 23 via INTRAVENOUS

## 2013-03-09 NOTE — Telephone Encounter (Signed)
Per staff message and POF I have scheduled appts.  JMW  

## 2013-03-09 NOTE — Progress Notes (Signed)
OFFICE PROGRESS NOTE  CC  [name] Greenland, MD [address] [place] [address] Dr. 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 Patient is status post bilateral mastectomy with immediate reconstruction  STAGE:  Right breast (mastectomy)  T1cN0 1.2 cm ER-/PR-/her-/ki-67 92% 0/1 nodes positive  Left breast (mastectomy with snl) T1cN0 1.4 cm, 1.2 cm, 0.1c ER 100%, PR 11%, her2neu positive 6.50, Ki-67 80%   PRIOR THERAPY:  #1screening mammogram performed that showed 2 areas of concern on the left side.calcifications were noted in the left. In the right breast a possible mass warranted further evaluation.   #2 On 03/29/2012 patient underwent a bilateral diagnostic mammogram and right breast ultrasound. A spot magnification images demonstrate suspicious group of pleomorphic microcalcifications over the outer lower left breast with additional suspicious group of pleomorphic microcalcifications over the outer midportion of the left periareolar region. Spot compression images of the right breast demonstrate persistence of a 1 cm density at the edge of the film in the deep third of the right inner breast.   #3Ultrasound performed showed no focal abnormality over the entire in her right breast to correspond to the mammographic density. Patient was recommended stereotactic core needle biopsy of the 2 groups of suspicious left breast microcalcifications. Because patient and her husband wanted bilateral mastectomies MRI was not performed.on 04/15/2012 patient had needle core biopsy performed of the 2 areas in the left breast. The left needle core biopsy in the lower breast revealed ductal carcinoma in situ with associated comedo necrosis and calcifications with the in situ carcinoma. It was ER +100% PR +12%. The subareolar  needle core biopsy of the left breast revealed invasive ductal carcinoma grade 2-3 with DCIS. Tumor was ER +100% PR +11% proliferation marker Ki-67 elevated at 80% HER-2/neu showed amplification with a ratio of 6.50.   #4 patient is now status post bilateral mastectomies performed on 05/14/2012 with immediate reconstruction. The final pathology revealed bilateral breast cancers. In the right breast tissue is noted to have a 1.2 cm invasive ductal carcinoma high-grade ER negative PR negative HER-2/neu negative with Ki-67 92% one node was negative for metastatic disease but this was not the sentinel lymph node. On the left side the patient had multifocal disease measuring 1.4 cm, 1.2 cm, 0.1 cm. Tumor was ER +100% PR +11% HER-2/neu positive with a ratio 6.50. Ki-67 was 80% 8 sentinel nodes were negative for metastatic disease.  #5 patient has had immediate reconstruction with expanders bilaterally.  #6s/p adjuvant chemotherapy initially consisting of Adriamycin Cytoxan every 2 weeks for a total of 4 cycles. Given 07/02/12 - 08/12/12  #7This will then be followed by Taxol and Herceptin to be given weekly for 12 weeks time.beginning 08/27/12. This was discontinued due to neuropathy.  #8 Abraxane and Herceptin days 1, day 8, day 15 on a 28 day cycle. Beginning 09/03/2012 - 11/2012  #9 curative intent adjuvant Herceptin every 3 weeks beginning 01/05/2013  #10 curative intent adjuvant letrozole 2.5 mg daily starting 01/05/2013. Total of 5 years of therapy is planned   CURRENT THERAPY: adjuvant curative intent Herceptin every 3 weeks, daily Letrozole  INTERVAL HISTORY: [name] Olivar 63 y.o. female returns for followup visit. overall patient is doing well. She is taking the Letrozole daily. She does endorse hot flashes, mild joint aches, and vaginal dryness relieved with lubricant.  Her last  bone density was in 2008.  Patient had an echocardiogram which did show an EF of 50-55. This was down from 55-60.  I discussed the findings of this echo with the patient. I also spoke to Dr. Daneen Schick patient's cardiologist regarding the fall in ejection fraction. We discussed doing MUGA scan. This is now ordered. In the meantime we are planning on holding her Herceptin today. If the MUGA scan reveals an EF greater than 55% then we will plan on reinstituting the Herceptin. If it is lower than patient will be seen by Dr. Tamala Julian.  She denies any chest pain, palpitations, fatigue, DOE, PND, orthopnea, or any further concerns.    MEDICAL HISTORY: Past Medical History  Diagnosis Date  . Heart murmur   . Diabetes mellitus without complication   . Osteopenia   . Cancer     breast  . Bronchitis   . Pneumonia   . Hiatal hernia   . Sinus problem   . Diabetes mellitus   . Breast cancer 04/15/12    left-biopsy  . Breast cancer, right breast 05/14/12    right mastectomy  . CHF with unknown LVEF 03/09/2013    ALLERGIES:  is allergic to darvon and penicillins.  MEDICATIONS:  Current Outpatient Prescriptions  Medication Sig Dispense Refill  . Ascorbic Acid (VITAMIN C) 500 MG tablet Take 500 mg by mouth daily.        Marland Kitchen azithromycin (ZITHROMAX Z-PAK) 250 MG tablet Take 2 today then 1 a day until completed  6 each  1  . Biotin 10 MG TABS Take by mouth. 4000 mcg daily      . calcitonin, salmon, (MIACALCIN/FORTICAL) 200 UNIT/ACT nasal spray Place 1 spray into the nose daily.      . Calcium Citrate (CITRACAL PO) Take by mouth.      . Cholecalciferol (VITAMIN D PO) Take 1 tablet by mouth daily.      . fluticasone (FLONASE) 50 MCG/ACT nasal spray Place 2 sprays into the nose daily.  16 g  2  . gabapentin (NEURONTIN) 100 MG capsule Take 1 capsule at bedtime  30 capsule  4  . KRILL OIL PO Take by mouth.      . letrozole (FEMARA) 2.5 MG tablet Take 1 tablet (2.5 mg total) by mouth daily.  90 tablet  12  . lidocaine-prilocaine (EMLA) cream Apply topically as needed.  30 g  6  . Multiple Vitamin (MULTIVITAMIN PO) Take 1  tablet by mouth daily.       . pantoprazole (PROTONIX) 40 MG tablet Take 40 mg by mouth daily.        . Probiotic Product (PROBIOTIC DAILY PO) Take by mouth daily.      . rosuvastatin (CRESTOR) 5 MG tablet Take 5 mg by mouth every Monday, Wednesday, and Friday.      . sitaGLIPtin (JANUVIA) 100 MG tablet Take 100 mg by mouth daily.      Marland Kitchen UNABLE TO FIND Apply 1 Device topically once. Cranial prosthesis r/t chemotherapy treatment  1 Device  0   No current facility-administered medications for this visit.    SURGICAL HISTORY:  Past Surgical History  Procedure Laterality Date  . Cesarean section  1981, 1983  . Tummy tuck  1999  . Tonsillectomy    . Foot surgery    . Gastric bypass    . Wound debridement  01/14/2011    Procedure: DEBRIDEMENT ABDOMINAL WOUND;  Surgeon: Adin Hector, MD;  Location: [facility];  Service: General;  Laterality: N/A;  wound debridement and debridement on the abdomen  . Cosmetic surgery    . Total mastectomy Bilateral 05/14/2012    Procedure: TOTAL MASTECTOMY;  Surgeon: Adin Hector, MD;  Location: [facility];  Service: General;  Laterality: Bilateral;  . Axillary sentinel node biopsy Left 05/14/2012    Procedure: AXILLARY SENTINEL NODE BIOPSY;  Surgeon: Adin Hector, MD;  Location: [facility];  Service: General;  Laterality: Left;  . Portacath placement N/A 05/14/2012    Procedure: INSERTION PORT-A-CATH;  Surgeon: Adin Hector, MD;  Location: [facility];  Service: General;  Laterality: N/A;  . Breast reconstruction with placement of tissue expander and flex hd (acellular hydrated dermis) Bilateral 05/14/2012    Procedure: BREAST RECONSTRUCTION WITH PLACEMENT OF TISSUE EXPANDER AND FLEX HD (ACELLULAR HYDRATED DERMIS);  Surgeon: Crissie Reese, MD;  Location: [facility];  Service: Plastics;  Laterality: Bilateral;  . Bunionectomy      REVIEW OF SYSTEMS:  A 10 point review of systems was conducted and is otherwise negative except for what is noted above.     PHYSICAL EXAMINATION: Blood pressure 142/86, pulse 84, temperature 98.7 F (37.1 C), temperature source Oral, resp. rate 18, height '5\' 4"'  (1.626 m), weight 195 lb (88.451 kg). Body mass index is 33.46 kg/(m^2). GENERAL: Patient is a well appearing female in no acute distress HEENT:  Sclerae anicteric.  Oropharynx clear and moist. No ulcerations or evidence of oropharyngeal candidiasis. Neck is supple.  NODES:  No cervical, supraclavicular, or axillary lymphadenopathy palpated.  BREAST EXAM:  Deferred. LUNGS:  Clear to auscultation bilaterally.  No wheezes or rhonchi. HEART:  Regular rate and rhythm. No murmur appreciated. ABDOMEN:  Soft, nontender.  Positive, normoactive bowel sounds. No organomegaly palpated. MSK:  No focal spinal tenderness to palpation. Full range of motion bilaterally in the upper extremities. EXTREMITIES:  No peripheral edema.   SKIN:  Clear with no obvious rashes or skin changes. No nail dyscrasia. NEURO:  Nonfocal. Well oriented.  Appropriate affect. ECOG PERFORMANCE STATUS: 0 - Asymptomatic  LABORATORY DATA: Lab Results  Component Value Date   WBC 4.6 03/09/2013   HGB 12.5 03/09/2013   HCT 39.3 03/09/2013   MCV 84.3 03/09/2013   PLT 275 03/09/2013      Chemistry      Component Value Date/Time   NA 147* 02/15/2013 1346   NA 139 05/17/2012 0530   K 4.1 02/15/2013 1346   K 3.7 05/17/2012 0530   CL 104 07/30/2012 0955   CL 100 05/17/2012 0530   CO2 27 02/15/2013 1346   CO2 33* 05/17/2012 0530   BUN 14.1 02/15/2013 1346   BUN 4* 05/17/2012 0530   CREATININE 0.8 02/15/2013 1346   CREATININE 0.56 05/17/2012 0530      Component Value Date/Time   CALCIUM 9.9 02/15/2013 1346   CALCIUM 8.7 05/17/2012 0530   ALKPHOS 102 02/15/2013 1346   ALKPHOS 103 05/10/2012 1440   AST 18 02/15/2013 1346   AST 25 05/10/2012 1440   ALT 18 02/15/2013 1346   ALT 17 05/10/2012 1440   BILITOT 0.31 02/15/2013 1346   BILITOT 0.4 05/10/2012 1440    ADDITIONAL INFORMATION: 6.  PROGNOSTIC INDICATORS - ACIS Results: IMMUNOHISTOCHEMICAL AND MORPHOMETRIC ANALYSIS BY THE AUTOMATED CELLULAR IMAGING SYSTEM (ACIS) Estrogen Receptor: 0%, NEGATIVE Progesterone Receptor: 0%, NEGATIVE Proliferation Marker Ki67: 92% COMMENT: The negative hormone receptor study(ies) in this case have an internal positive control. REFERENCE RANGE ESTROGEN RECEPTOR NEGATIVE <1% POSITIVE =>1% PROGESTERONE RECEPTOR NEGATIVE <  1% POSITIVE =>1% All controls stained appropriately Enid Cutter MD Pathologist, Electronic Signature ( Signed 05/21/2012) 6. CHROMOGENIC IN-SITU HYBRIDIZATION Results: HER-2/NEU BY CISH - NO AMPLIFICATION OF HER-2 DETECTED. 1 of 6 Duplicate copy FINAL for Scherman, [name] G [phone]) ADDITIONAL INFORMATION:(continued) RESULT RATIO OF HER2: CEP 17 SIGNALS 1.19 AVERAGE HER2 COPY NUMBER PER CELL 1.85 REFERENCE RANGE NEGATIVE HER2/Chr17 Ratio <2.0 and Average HER2 copy number <4.0 EQUIVOCAL HER2/Chr17 Ratio <2.0 and Average HER2 copy number 4.0 and <6.0 POSITIVE HER2/Chr17 Ratio >=2.0 and/or Average HER2 copy number >=6.0 Enid Cutter MD Pathologist, Electronic Signature ( Signed 05/20/2012) FINAL DIAGNOSIS Diagnosis 1. Lymph node, sentinel, biopsy, Left axillary - THERE IS NO EVIDENCE OF CARCINOMA IN 1 OF 1 LYMPH NODE (0/1). 2. Lymph node, sentinel, biopsy, Left axillary - THERE IS NO EVIDENCE OF CARCINOMA IN 1 OF 1 LYMPH NODE (0/1). 3. Lymph node, sentinel, biopsy, Left axillary - THERE IS NO EVIDENCE OF CARCINOMA IN 1 OF 1 LYMPH NODE (0/1). 4. Lymph node, sentinel, biopsy, Left axillary - THERE IS NO EVIDENCE OF CARCINOMA IN 1 OF 1 LYMPH NODE (0/1). 5. Lymph node, sentinel, biopsy, Left axillary - THERE IS NO EVIDENCE OF CARCINOMA IN 1 OF 1 LYMPH NODE (0/1). 6. Breast, simple mastectomy, Right - INVASIVE DUCTAL CARCINOMA, GRADE III/III, SPANNING 1.2 CM. - DUCTAL CARCINOMA IN SITU, HIGH GRADE. - LYMPHOVASCULAR INVASION IS IDENTIFIED. - THERE IS NO  EVIDENCE OF CARCINOMA IN 1 OF 1 LYMPH NODE (0/1). - SEE ONCOLOGY TABLE BELOW. 7. Breast, simple mastectomy, Left - INVASIVE DUCTAL CARCINOMA, AT LEAST THREE FOCI, GRADE II/III, SPANNING 1.4 CM, 1.2 CM, AND 0.1 CM. - DUCTAL CARCINOMA IN SITU, INTERMEDIATE GRADE. - LYMPHOVASCULAR INVASION IS IDENTIFIED. - DUCTAL CARCINOMA IN SITU IS FOCALLY 0.2 CM TO THE ANTERIOR/INFERIOR SOFT TISSUE RESECTION MARGIN. - LOBULAR NEOPLASIA (LOBULAR CARCINOMA IN SITU). - THERE IS NO EVIDENCE OF CARCINOMA IN 2 OF 2 LYMPH NODES (0/2). - SEE ONCOLOGY TABLE BELOW. 8. Lymph node, biopsy, Left axillary - THERE IS NO EVIDENCE OF CARCINOMA IN 1 OF 1 LYMPH NODE (0/1). Microscopic Comment 6. BREAST, INVASIVE TUMOR, WITH LYMPH NODE SAMPLING Specimen, including laterality: Right breast Procedure: Simple mastectomy Grade: 3 2 of 6 Duplicate copy FINAL for Swann, [name] G ([id number]) Microscopic Comment(continued) Tubule formation: 3 Nuclear pleomorphism: 3 Mitotic:2 Tumor size (gross measurement): 1.2 cm Margins: Invasive, distance to closest margin: 1.3 cm to the deep margin (gross measurement) In-situ, distance to closest margin: 1.3 cm to the deep margin (gross measurement) Lymphovascular invasion: Present Ductal carcinoma in situ: Present Grade: High grade Extensive intraductal component: Not identified Lobular neoplasia: Not identified in specimen #6 Tumor focality: Unifocal Treatment effect: N/A Extent of tumor: Confined to breast parenchyma Lymph nodes: # examined: 1 Lymph nodes with metastasis: 0 Breast prognostic profile: Will be performed on the current case and the results reported separately. TNM: pT1c, pN0 7. BREAST, INVASIVE TUMOR, WITH LYMPH NODE SAMPLING Specimen, including laterality: Left breast Procedure: Simple mastectomy Grade: 2 Tubule formation: 3 Nuclear pleomorphism: 2 Mitotic:2 Tumor size (gross measurements) 1.4 cm, 1.2 cm, and 0.1 cm Margins: Invasive, distance to  closest margin: 0.7 cm to the anterior/inferior soft tissue resection margin In-situ, distance to closest margin: Focally 0.2 cm to the anterior/inferior soft tissue resection margin (glass slide measurement) Lymphovascular invasion: Present Ductal carcinoma in situ: Present Grade: Intermediate grade Extensive intraductal component: Yes Lobular neoplasia: Yes (lobular carcinoma in situ) Tumor focality: Multiple foci Treatment effect: N/A Extent of tumor: Confined to breast parenchyma Lymph nodes: # examined: 8  Lymph nodes with metastasis: 0 Breast prognostic profile: [phone] Estrogen receptor: 100% strong staining intensity Progesterone receptor: 11% strong staining intensity Her 2 neu: Amplification was detected. The ratio was 7.25. 3 of 6 Duplicate copy FINAL for Liskey, [name] G ([id number]) Microscopic Comment(continued) Ki-67: 80% TNM: mpT1c, pN0 Comments: In addition to the two grossly identified nodules, there is invasive carcinoma present in random tissue submitted from the inferior lateral quadrant. The three foci of carcinoma are morphologically similar.   RADIOGRAPHIC STUDIES:  Nm Pet Image Initial (pi) Skull Base To Thigh  05/12/2012  *RADIOLOGY REPORT*  Clinical Data: Initial treatment strategy for breast cancer.   NUCLEAR MEDICINE PET SKULL BASE TO THIGH  Fasting Blood Glucose:  143  Technique:  20 mCi F-18 FDG was injected intravenously. CT data was obtained and used for attenuation correction and anatomic localization only.  (This was not acquired as a diagnostic CT examination.) Additional exam technical data entered on technologist worksheet.  Comparison:  none  Findings:  Neck: No hypermetabolic lymph nodes in the neck.  Chest:  In the inferior medial aspect of the right breast there is a 10 mm nodule (image 99) with moderate  metabolic activity ( SUV max = 2.9).  No hypermetabolic mediastinal or hilar lymph nodes.  There is hypermetabolic activity in the  thoracic inlet which is felt to be vascular in nature.  There is activity adjacent to the thoracic aorta which is also felt to be vascular may relate to the hemiazygos vein.  Abdomen/Pelvis:  No abnormal hypermetabolic activity within the liver, pancreas, adrenal glands, or spleen.  No hypermetabolic lymph nodes in the abdomen or pelvis.  Skeleton:  No focal hypermetabolic activity to suggest skeletal metastasis.  IMPRESSION:  1.  No evidence of distant breast cancer metastasis. 2.  Hypermetabolic nodule within the inferior medial right breast   Original Report Authenticated By: Suzy Bouchard, M.D.    Nm Sentinel Node Inj-no Rpt (breast)  05/14/2012  CLINICAL DATA: cancer left breast   Sulfur colloid was injected intradermally by the nuclear medicine  technologist for breast cancer sentinel node localization.     Dg Chest Port 1 View  05/14/2012  *RADIOLOGY REPORT*  Clinical Data: Status post Port-A-Cath placement  PORTABLE CHEST - 1 VIEW  Comparison: None.  Findings: The cardiac shadow is within normal limits.  Bilateral tissue expanders as well as surgical drains are identified.  A right chest wall port is seen. The catheter tip is at the cavoatrial junction.  No pneumothorax is noted.  The lungs are clear bilaterally.  IMPRESSION: No evidence of pneumothorax following port placement.  Bilateral tissue expanders.   Original Report Authenticated By: Inez Catalina, M.D.    Dg Fluoro Guide Cv Line-no Report  05/14/2012  CLINICAL DATA: LEFT BREAST CANCER/PORT-A-CATH   FLOURO GUIDE CV LINE  Fluoroscopy was utilized by the requesting physician.  No radiographic  interpretation.      ASSESSMENT: 63 year old female with  #1 bilateral breast cancers on the right side she has triple-negative disease stage I left side stage I ER positive PR positive HER-2/neu positive disease. Patient is status post bilateral mastectomies. Of note on the right side the lymph node removed was not a sentinel lymph node. It is  unclear what to do with this side in terms of whether or not to do further sentinel lymph node. Clinically patient seems to be doing well.  #2status post Adriamycin Cytoxan given every 2 weeks for a total of 4 cycles followed by Taxol and  Herceptin given weekly, however after 2-3 cycles patient could not tolerate the Taxol and therefore her treatment was changed to Abraxane and Herceptin. She completed Abraxane Herceptin combination on 10 /2014.  #3 patient to begin adjuvant antiestrogen therapy consisting of Letrozole 2.5 mg daily starting 01/05/2013. Total of 5 years of therapy is planned.  #4 patient will also begin adjuvant Herceptin every 3 weeks to finish out a total of one year of therapy.  PLAN:  #1 proceed with further evaluation of heart with MUGA scan. I have ordered a MUGA scan today and will be done at 1:00 today.  #2 we will hold Herceptin today. If the MUGA is okay then we will proceed with treatment tomorrow  #3 patient will be seen back in 3 weeks' time for her next Herceptin.  All questions were answered. The patient knows to call the clinic with any problems, questions or concerns. We can certainly see the patient much sooner if necessary.  I spent 25 minutes counseling the patient face to face. The total time spent in the appointment was 30 minutes.  Marcy Panning, MD Medical/Oncology [facility] [phone] (beeper) [phone] (Office)  03/09/2013, 8:47 AM

## 2013-03-10 ENCOUNTER — Ambulatory Visit (HOSPITAL_BASED_OUTPATIENT_CLINIC_OR_DEPARTMENT_OTHER): Payer: BC Managed Care – PPO

## 2013-03-10 ENCOUNTER — Other Ambulatory Visit: Payer: Self-pay | Admitting: Oncology

## 2013-03-10 VITALS — BP 133/73 | HR 77 | Temp 97.3°F | Resp 18

## 2013-03-10 DIAGNOSIS — C50119 Malignant neoplasm of central portion of unspecified female breast: Secondary | ICD-10-CM

## 2013-03-10 DIAGNOSIS — C50911 Malignant neoplasm of unspecified site of right female breast: Secondary | ICD-10-CM

## 2013-03-10 DIAGNOSIS — Z5112 Encounter for antineoplastic immunotherapy: Secondary | ICD-10-CM

## 2013-03-10 DIAGNOSIS — C50919 Malignant neoplasm of unspecified site of unspecified female breast: Secondary | ICD-10-CM

## 2013-03-10 DIAGNOSIS — C50912 Malignant neoplasm of unspecified site of left female breast: Secondary | ICD-10-CM

## 2013-03-10 MED ORDER — SODIUM CHLORIDE 0.9 % IV SOLN
Freq: Once | INTRAVENOUS | Status: AC
  Administered 2013-03-10: 12:00:00 via INTRAVENOUS

## 2013-03-10 MED ORDER — HEPARIN SOD (PORK) LOCK FLUSH 100 UNIT/ML IV SOLN
500.0000 [IU] | Freq: Once | INTRAVENOUS | Status: AC | PRN
  Administered 2013-03-10: 500 [IU]
  Filled 2013-03-10: qty 5

## 2013-03-10 MED ORDER — DIPHENHYDRAMINE HCL 25 MG PO CAPS
ORAL_CAPSULE | ORAL | Status: AC
  Filled 2013-03-10: qty 2

## 2013-03-10 MED ORDER — SODIUM CHLORIDE 0.9 % IJ SOLN
10.0000 mL | INTRAMUSCULAR | Status: DC | PRN
  Administered 2013-03-10: 10 mL
  Filled 2013-03-10: qty 10

## 2013-03-10 MED ORDER — DIPHENHYDRAMINE HCL 25 MG PO CAPS
50.0000 mg | ORAL_CAPSULE | Freq: Once | ORAL | Status: AC
  Administered 2013-03-10: 50 mg via ORAL

## 2013-03-10 MED ORDER — ACETAMINOPHEN 325 MG PO TABS
ORAL_TABLET | ORAL | Status: AC
  Filled 2013-03-10: qty 2

## 2013-03-10 MED ORDER — ACETAMINOPHEN 325 MG PO TABS
650.0000 mg | ORAL_TABLET | Freq: Once | ORAL | Status: AC
  Administered 2013-03-10: 650 mg via ORAL

## 2013-03-10 MED ORDER — SODIUM CHLORIDE 0.9 % IV SOLN
6.0000 mg/kg | Freq: Once | INTRAVENOUS | Status: AC
  Administered 2013-03-10: 525 mg via INTRAVENOUS
  Filled 2013-03-10: qty 25

## 2013-03-23 ENCOUNTER — Telehealth: Payer: Self-pay | Admitting: *Deleted

## 2013-03-23 NOTE — Telephone Encounter (Signed)
Called pt with results of bone density. No answer but left a detailed message to answering service. Recommended that pt keeps taking her calcium and Vitamin D and to follow up with testing in 2 years. Message to be forwarded to  Charlestine Massed, NP.

## 2013-03-29 ENCOUNTER — Other Ambulatory Visit (HOSPITAL_BASED_OUTPATIENT_CLINIC_OR_DEPARTMENT_OTHER): Payer: BC Managed Care – PPO

## 2013-03-29 ENCOUNTER — Ambulatory Visit (HOSPITAL_BASED_OUTPATIENT_CLINIC_OR_DEPARTMENT_OTHER): Payer: BC Managed Care – PPO | Admitting: Oncology

## 2013-03-29 ENCOUNTER — Telehealth: Payer: Self-pay | Admitting: *Deleted

## 2013-03-29 ENCOUNTER — Ambulatory Visit (HOSPITAL_BASED_OUTPATIENT_CLINIC_OR_DEPARTMENT_OTHER): Payer: BC Managed Care – PPO

## 2013-03-29 ENCOUNTER — Encounter: Payer: Self-pay | Admitting: Oncology

## 2013-03-29 VITALS — BP 145/84 | HR 82 | Temp 98.6°F | Resp 20 | Ht 64.0 in | Wt 196.6 lb

## 2013-03-29 DIAGNOSIS — C50911 Malignant neoplasm of unspecified site of right female breast: Secondary | ICD-10-CM

## 2013-03-29 DIAGNOSIS — Z170 Estrogen receptor positive status [ER+]: Secondary | ICD-10-CM

## 2013-03-29 DIAGNOSIS — Z5112 Encounter for antineoplastic immunotherapy: Secondary | ICD-10-CM

## 2013-03-29 DIAGNOSIS — C50119 Malignant neoplasm of central portion of unspecified female breast: Secondary | ICD-10-CM

## 2013-03-29 DIAGNOSIS — M949 Disorder of cartilage, unspecified: Secondary | ICD-10-CM

## 2013-03-29 DIAGNOSIS — M8580 Other specified disorders of bone density and structure, unspecified site: Secondary | ICD-10-CM

## 2013-03-29 DIAGNOSIS — C50919 Malignant neoplasm of unspecified site of unspecified female breast: Secondary | ICD-10-CM

## 2013-03-29 DIAGNOSIS — C50912 Malignant neoplasm of unspecified site of left female breast: Secondary | ICD-10-CM

## 2013-03-29 DIAGNOSIS — M899 Disorder of bone, unspecified: Secondary | ICD-10-CM

## 2013-03-29 LAB — CBC WITH DIFFERENTIAL/PLATELET
BASO%: 0.5 % (ref 0.0–2.0)
Basophils Absolute: 0 10*3/uL (ref 0.0–0.1)
EOS%: 2.4 % (ref 0.0–7.0)
Eosinophils Absolute: 0.1 10*3/uL (ref 0.0–0.5)
HCT: 37.7 % (ref 34.8–46.6)
HGB: 12.1 g/dL (ref 11.6–15.9)
LYMPH%: 22.4 % (ref 14.0–49.7)
MCH: 26.9 pg (ref 25.1–34.0)
MCHC: 32.1 g/dL (ref 31.5–36.0)
MCV: 84 fL (ref 79.5–101.0)
MONO#: 0.5 10*3/uL (ref 0.1–0.9)
MONO%: 12.3 % (ref 0.0–14.0)
NEUT#: 2.7 10*3/uL (ref 1.5–6.5)
NEUT%: 62.4 % (ref 38.4–76.8)
Platelets: 243 10*3/uL (ref 145–400)
RBC: 4.49 10*6/uL (ref 3.70–5.45)
RDW: 15.3 % — ABNORMAL HIGH (ref 11.2–14.5)
WBC: 4.2 10*3/uL (ref 3.9–10.3)
lymph#: 1 10*3/uL (ref 0.9–3.3)

## 2013-03-29 LAB — COMPREHENSIVE METABOLIC PANEL (CC13)
ALT: 20 U/L (ref 0–55)
AST: 22 U/L (ref 5–34)
Albumin: 3.9 g/dL (ref 3.5–5.0)
Alkaline Phosphatase: 95 U/L (ref 40–150)
Anion Gap: 10 mEq/L (ref 3–11)
BUN: 15.4 mg/dL (ref 7.0–26.0)
CO2: 31 mEq/L — ABNORMAL HIGH (ref 22–29)
Calcium: 10.2 mg/dL (ref 8.4–10.4)
Chloride: 104 mEq/L (ref 98–109)
Creatinine: 0.7 mg/dL (ref 0.6–1.1)
Glucose: 137 mg/dl (ref 70–140)
Potassium: 4.2 mEq/L (ref 3.5–5.1)
Sodium: 145 mEq/L (ref 136–145)
Total Bilirubin: 0.34 mg/dL (ref 0.20–1.20)
Total Protein: 6.8 g/dL (ref 6.4–8.3)

## 2013-03-29 MED ORDER — ACETAMINOPHEN 325 MG PO TABS
ORAL_TABLET | ORAL | Status: AC
  Filled 2013-03-29: qty 2

## 2013-03-29 MED ORDER — SODIUM CHLORIDE 0.9 % IJ SOLN
10.0000 mL | INTRAMUSCULAR | Status: DC | PRN
  Administered 2013-03-29: 10 mL
  Filled 2013-03-29: qty 10

## 2013-03-29 MED ORDER — TRASTUZUMAB CHEMO INJECTION 440 MG
6.0000 mg/kg | Freq: Once | INTRAVENOUS | Status: AC
  Administered 2013-03-29: 525 mg via INTRAVENOUS
  Filled 2013-03-29: qty 25

## 2013-03-29 MED ORDER — DIPHENHYDRAMINE HCL 25 MG PO CAPS
50.0000 mg | ORAL_CAPSULE | Freq: Once | ORAL | Status: AC
  Administered 2013-03-29: 50 mg via ORAL

## 2013-03-29 MED ORDER — HEPARIN SOD (PORK) LOCK FLUSH 100 UNIT/ML IV SOLN
500.0000 [IU] | Freq: Once | INTRAVENOUS | Status: AC | PRN
  Administered 2013-03-29: 500 [IU]
  Filled 2013-03-29: qty 5

## 2013-03-29 MED ORDER — DIPHENHYDRAMINE HCL 25 MG PO CAPS
ORAL_CAPSULE | ORAL | Status: AC
  Filled 2013-03-29: qty 2

## 2013-03-29 MED ORDER — SODIUM CHLORIDE 0.9 % IV SOLN
Freq: Once | INTRAVENOUS | Status: AC
  Administered 2013-03-29: 09:00:00 via INTRAVENOUS

## 2013-03-29 MED ORDER — ACETAMINOPHEN 325 MG PO TABS
650.0000 mg | ORAL_TABLET | Freq: Once | ORAL | Status: AC
  Administered 2013-03-29: 650 mg via ORAL

## 2013-03-29 NOTE — Progress Notes (Signed)
OFFICE PROGRESS NOTE  CC  [name] Greenland, MD [address] [place] [address] Dr. 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 Patient is status post bilateral mastectomy with immediate reconstruction  STAGE:  Right breast (mastectomy)  T1cN0 1.2 cm ER-/PR-/her-/ki-67 92% 0/1 nodes positive  Left breast (mastectomy with snl) T1cN0 1.4 cm, 1.2 cm, 0.1c ER 100%, PR 11%, her2neu positive 6.50, Ki-67 80%   PRIOR THERAPY:  #1screening mammogram performed that showed 2 areas of concern on the left side.calcifications were noted in the left. In the right breast a possible mass warranted further evaluation.   #2 On 03/29/2012 patient underwent a bilateral diagnostic mammogram and right breast ultrasound. A spot magnification images demonstrate suspicious group of pleomorphic microcalcifications over the outer lower left breast with additional suspicious group of pleomorphic microcalcifications over the outer midportion of the left periareolar region. Spot compression images of the right breast demonstrate persistence of a 1 cm density at the edge of the film in the deep third of the right inner breast.   #3Ultrasound performed showed no focal abnormality over the entire in her right breast to correspond to the mammographic density. Patient was recommended stereotactic core needle biopsy of the 2 groups of suspicious left breast microcalcifications. Because patient and her husband wanted bilateral mastectomies MRI was not performed.on 04/15/2012 patient had needle core biopsy performed of the 2 areas in the left breast. The left needle core biopsy in the lower breast revealed ductal carcinoma in situ with associated comedo necrosis and calcifications with the in situ carcinoma. It was ER +100% PR +12%. The subareolar  needle core biopsy of the left breast revealed invasive ductal carcinoma grade 2-3 with DCIS. Tumor was ER +100% PR +11% proliferation marker Ki-67 elevated at 80% HER-2/neu showed amplification with a ratio of 6.50.   #4 patient is now status post bilateral mastectomies performed on 05/14/2012 with immediate reconstruction. The final pathology revealed bilateral breast cancers. In the right breast tissue is noted to have a 1.2 cm invasive ductal carcinoma high-grade ER negative PR negative HER-2/neu negative with Ki-67 92% one node was negative for metastatic disease but this was not the sentinel lymph node. On the left side the patient had multifocal disease measuring 1.4 cm, 1.2 cm, 0.1 cm. Tumor was ER +100% PR +11% HER-2/neu positive with a ratio 6.50. Ki-67 was 80% 8 sentinel nodes were negative for metastatic disease.  #5 patient has had immediate reconstruction with expanders bilaterally.  #6s/p adjuvant chemotherapy initially consisting of Adriamycin Cytoxan every 2 weeks for a total of 4 cycles. Given 07/02/12 - 08/12/12  #7This will then be followed by Taxol and Herceptin to be given weekly for 12 weeks time.beginning 08/27/12. This was discontinued due to neuropathy.  #8 Abraxane and Herceptin days 1, day 8, day 15 on a 28 day cycle. Beginning 09/03/2012 - 11/2012  #9 curative intent adjuvant Herceptin every 3 weeks beginning 01/05/2013  #10 curative intent adjuvant letrozole 2.5 mg daily starting 01/05/2013. Total of 5 years of therapy is planned   CURRENT THERAPY: adjuvant curative intent Herceptin every 3 weeks, daily Letrozole  INTERVAL HISTORY: [name] [name] 63 y.o. female returns for followup visit. overall patient is doing well. She is taking the Letrozole daily. She does endorse hot flashes, mild joint aches, and vaginal dryness relieved with lubricant.   Patient  is doing well clinically. She does have joint pain especially in the knees. She does complain of fatigue. She is  experiencing hot flashes since being on letrozole. She had a bone density scan performed which reveals osteopenia. She is not able to tolerate bisphosphonates specifically Boniva. She had some bone loss under mandible. She was taking intranasal calcium but is no longer taking it due to intolerance. She denies any headaches double vision blurring of vision back pain no fevers or chills. No bleeding problems. She does have fatigue. She is not exercising he had. But she is gong to begin this in the next few weeks. She is planning to go to [place].  MEDICAL HISTORY: Past Medical History  Diagnosis Date  . Heart murmur   . Diabetes mellitus without complication   . Osteopenia   . Cancer     breast  . Bronchitis   . Pneumonia   . Hiatal hernia   . Sinus problem   . Diabetes mellitus   . Breast cancer 04/15/12    left-biopsy  . Breast cancer, right breast 05/14/12    right mastectomy  . CHF with unknown LVEF 03/09/2013    ALLERGIES:  is allergic to darvon and penicillins.  MEDICATIONS:  Current Outpatient Prescriptions  Medication Sig Dispense Refill  . Ascorbic Acid (VITAMIN C) 500 MG tablet Take 500 mg by mouth daily.        Marland Kitchen azithromycin (ZITHROMAX Z-PAK) 250 MG tablet Take 2 today then 1 a day until completed  6 each  1  . Biotin 10 MG TABS Take by mouth. 4000 mcg daily      . calcitonin, salmon, (MIACALCIN/FORTICAL) 200 UNIT/ACT nasal spray Place 1 spray into the nose daily.      . Calcium Citrate (CITRACAL PO) Take by mouth.      . Cholecalciferol (VITAMIN D PO) Take 1 tablet by mouth daily.      . fluticasone (FLONASE) 50 MCG/ACT nasal spray Place 2 sprays into the nose daily.  16 [name]  2  . gabapentin (NEURONTIN) 100 MG capsule Take 1 capsule at bedtime  30 capsule  4  . KRILL OIL PO Take by mouth.      . letrozole (FEMARA) 2.5 MG tablet Take 1 tablet (2.5 mg total) by mouth daily.  90 tablet  12  . lidocaine-prilocaine (EMLA) cream Apply topically as needed.  30 [name]  6  . Multiple  Vitamin (MULTIVITAMIN PO) Take 1 tablet by mouth daily.       . pantoprazole (PROTONIX) 40 MG tablet Take 40 mg by mouth daily.        . Probiotic Product (PROBIOTIC DAILY PO) Take by mouth daily.      . rosuvastatin (CRESTOR) 5 MG tablet Take 5 mg by mouth every Monday, Wednesday, and Friday.      . sitaGLIPtin (JANUVIA) 100 MG tablet Take 100 mg by mouth daily.      Marland Kitchen UNABLE TO FIND Apply 1 Device topically once. Cranial prosthesis r/t chemotherapy treatment  1 Device  0   No current facility-administered medications for this visit.    SURGICAL HISTORY:  Past Surgical History  Procedure Laterality Date  . Cesarean section  1981, 1983  . Tummy tuck  1999  . Tonsillectomy    . Foot surgery    . Gastric bypass    . Wound debridement  01/14/2011    Procedure: DEBRIDEMENT ABDOMINAL WOUND;  Surgeon: Adin Hector, MD;  Location: [facility];  Service: General;  Laterality: N/A;  wound debridement and debridement on the abdomen  . Cosmetic surgery    . Total mastectomy Bilateral 05/14/2012    Procedure: TOTAL MASTECTOMY;  Surgeon: Adin Hector, MD;  Location: [facility];  Service: General;  Laterality: Bilateral;  . Axillary sentinel node biopsy Left 05/14/2012    Procedure: AXILLARY SENTINEL NODE BIOPSY;  Surgeon: Adin Hector, MD;  Location: [facility];  Service: General;  Laterality: Left;  . Portacath placement N/A 05/14/2012    Procedure: INSERTION PORT-A-CATH;  Surgeon: Adin Hector, MD;  Location: [facility];  Service: General;  Laterality: N/A;  . Breast reconstruction with placement of tissue expander and flex hd (acellular hydrated dermis) Bilateral 05/14/2012    Procedure: BREAST RECONSTRUCTION WITH PLACEMENT OF TISSUE EXPANDER AND FLEX HD (ACELLULAR HYDRATED DERMIS);  Surgeon: Crissie Reese, MD;  Location: [facility];  Service: Plastics;  Laterality: Bilateral;  . Bunionectomy      REVIEW OF SYSTEMS:  A 10 point review of systems was conducted and is otherwise negative  except for what is noted above.    PHYSICAL EXAMINATION: Blood pressure 145/84, pulse 82, temperature 98.6 F (37 C), temperature source Oral, resp. rate 20, height 5' 4" (1.626 m), weight 196 lb 9.6 oz (89.177 kg). Body mass index is 33.73 kg/(m^2). GENERAL: Patient is a well appearing female in no acute distress HEENT:  Sclerae anicteric.  Oropharynx clear and moist. No ulcerations or evidence of oropharyngeal candidiasis. Neck is supple.  NODES:  No cervical, supraclavicular, or axillary lymphadenopathy palpated.  BREAST EXAM:  Deferred. LUNGS:  Clear to auscultation bilaterally.  No wheezes or rhonchi. HEART:  Regular rate and rhythm. No murmur appreciated. ABDOMEN:  Soft, nontender.  Positive, normoactive bowel sounds. No organomegaly palpated. MSK:  No focal spinal tenderness to palpation. Full range of motion bilaterally in the upper extremities. EXTREMITIES:  No peripheral edema.   SKIN:  Clear with no obvious rashes or skin changes. No nail dyscrasia. NEURO:  Nonfocal. Well oriented.  Appropriate affect. ECOG PERFORMANCE STATUS: 0 - Asymptomatic  LABORATORY DATA: Lab Results  Component Value Date   WBC 4.2 03/29/2013   HGB 12.1 03/29/2013   HCT 37.7 03/29/2013   MCV 84.0 03/29/2013   PLT 243 03/29/2013      Chemistry      Component Value Date/Time   NA 146* 03/09/2013 0807   NA 139 05/17/2012 0530   K 4.8 03/09/2013 0807   K 3.7 05/17/2012 0530   CL 104 07/30/2012 0955   CL 100 05/17/2012 0530   CO2 30* 03/09/2013 0807   CO2 33* 05/17/2012 0530   BUN 15.8 03/09/2013 0807   BUN 4* 05/17/2012 0530   CREATININE 0.7 03/09/2013 0807   CREATININE 0.56 05/17/2012 0530      Component Value Date/Time   CALCIUM 10.3 03/09/2013 0807   CALCIUM 8.7 05/17/2012 0530   ALKPHOS 91 03/09/2013 0807   ALKPHOS 103 05/10/2012 1440   AST 20 03/09/2013 0807   AST 25 05/10/2012 1440   ALT 19 03/09/2013 0807   ALT 17 05/10/2012 1440   BILITOT 0.44 03/09/2013 0807   BILITOT 0.4 05/10/2012 1440     ADDITIONAL INFORMATION: 6. PROGNOSTIC INDICATORS - ACIS Results: IMMUNOHISTOCHEMICAL AND MORPHOMETRIC ANALYSIS BY THE AUTOMATED CELLULAR IMAGING SYSTEM (ACIS) Estrogen Receptor: 0%, NEGATIVE Progesterone Receptor: 0%, NEGATIVE Proliferation Marker Ki67: 92% COMMENT: The negative hormone receptor study(ies) in this case have an internal positive control. REFERENCE RANGE ESTROGEN RECEPTOR NEGATIVE <1% POSITIVE =>1% PROGESTERONE  RECEPTOR NEGATIVE <1% POSITIVE =>1% All controls stained appropriately Enid Cutter MD Pathologist, Electronic Signature ( Signed 05/21/2012) 6. CHROMOGENIC IN-SITU HYBRIDIZATION Results: HER-2/NEU BY CISH - NO AMPLIFICATION OF HER-2 DETECTED. 1 of 6 Duplicate copy FINAL for [name] [name], [name] [name]) ADDITIONAL INFORMATION:(continued) RESULT RATIO OF HER2: CEP 17 SIGNALS 1.19 AVERAGE HER2 COPY NUMBER PER CELL 1.85 REFERENCE RANGE NEGATIVE HER2/Chr17 Ratio <2.0 and Average HER2 copy number <4.0 EQUIVOCAL HER2/Chr17 Ratio <2.0 and Average HER2 copy number 4.0 and <6.0 POSITIVE HER2/Chr17 Ratio >=2.0 and/or Average HER2 copy number >=6.0 Enid Cutter MD Pathologist, Electronic Signature ( Signed 05/20/2012) FINAL DIAGNOSIS Diagnosis 1. Lymph node, sentinel, biopsy, Left axillary - THERE IS NO EVIDENCE OF CARCINOMA IN 1 OF 1 LYMPH NODE (0/1). 2. Lymph node, sentinel, biopsy, Left axillary - THERE IS NO EVIDENCE OF CARCINOMA IN 1 OF 1 LYMPH NODE (0/1). 3. Lymph node, sentinel, biopsy, Left axillary - THERE IS NO EVIDENCE OF CARCINOMA IN 1 OF 1 LYMPH NODE (0/1). 4. Lymph node, sentinel, biopsy, Left axillary - THERE IS NO EVIDENCE OF CARCINOMA IN 1 OF 1 LYMPH NODE (0/1). 5. Lymph node, sentinel, biopsy, Left axillary - THERE IS NO EVIDENCE OF CARCINOMA IN 1 OF 1 LYMPH NODE (0/1). 6. Breast, simple mastectomy, Right - INVASIVE DUCTAL CARCINOMA, GRADE III/III, SPANNING 1.2 CM. - DUCTAL CARCINOMA IN SITU, HIGH GRADE. - LYMPHOVASCULAR INVASION IS  IDENTIFIED. - THERE IS NO EVIDENCE OF CARCINOMA IN 1 OF 1 LYMPH NODE (0/1). - SEE ONCOLOGY TABLE BELOW. 7. Breast, simple mastectomy, Left - INVASIVE DUCTAL CARCINOMA, AT LEAST THREE FOCI, GRADE II/III, SPANNING 1.4 CM, 1.2 CM, AND 0.1 CM. - DUCTAL CARCINOMA IN SITU, INTERMEDIATE GRADE. - LYMPHOVASCULAR INVASION IS IDENTIFIED. - DUCTAL CARCINOMA IN SITU IS FOCALLY 0.2 CM TO THE ANTERIOR/INFERIOR SOFT TISSUE RESECTION MARGIN. - LOBULAR NEOPLASIA (LOBULAR CARCINOMA IN SITU). - THERE IS NO EVIDENCE OF CARCINOMA IN 2 OF 2 LYMPH NODES (0/2). - SEE ONCOLOGY TABLE BELOW. 8. Lymph node, biopsy, Left axillary - THERE IS NO EVIDENCE OF CARCINOMA IN 1 OF 1 LYMPH NODE (0/1). Microscopic Comment 6. BREAST, INVASIVE TUMOR, WITH LYMPH NODE SAMPLING Specimen, including laterality: Right breast Procedure: Simple mastectomy Grade: 3 2 of 6 Duplicate copy FINAL for [name] [name], [name] [name] ([id number]) Microscopic Comment(continued) Tubule formation: 3 Nuclear pleomorphism: 3 Mitotic:2 Tumor size (gross measurement): 1.2 cm Margins: Invasive, distance to closest margin: 1.3 cm to the deep margin (gross measurement) In-situ, distance to closest margin: 1.3 cm to the deep margin (gross measurement) Lymphovascular invasion: Present Ductal carcinoma in situ: Present Grade: High grade Extensive intraductal component: Not identified Lobular neoplasia: Not identified in specimen #6 Tumor focality: Unifocal Treatment effect: N/A Extent of tumor: Confined to breast parenchyma Lymph nodes: # examined: 1 Lymph nodes with metastasis: 0 Breast prognostic profile: Will be performed on the current case and the results reported separately. TNM: pT1c, pN0 7. BREAST, INVASIVE TUMOR, WITH LYMPH NODE SAMPLING Specimen, including laterality: Left breast Procedure: Simple mastectomy Grade: 2 Tubule formation: 3 Nuclear pleomorphism: 2 Mitotic:2 Tumor size (gross measurements) 1.4 cm, 1.2 cm, and 0.1  cm Margins: Invasive, distance to closest margin: 0.7 cm to the anterior/inferior soft tissue resection margin In-situ, distance to closest margin: Focally 0.2 cm to the anterior/inferior soft tissue resection margin (glass slide measurement) Lymphovascular invasion: Present Ductal carcinoma in situ: Present Grade: Intermediate grade Extensive intraductal component: Yes Lobular neoplasia: Yes (lobular carcinoma in situ) Tumor focality: Multiple foci Treatment effect: N/A Extent of tumor: Confined to breast parenchyma Lymph nodes: #  examined: 8 Lymph nodes with metastasis: 0 Breast prognostic profile: [phone] Estrogen receptor: 100% strong staining intensity Progesterone receptor: 11% strong staining intensity Her 2 neu: Amplification was detected. The ratio was 0.86. 3 of 6 Duplicate copy FINAL for [name] [name], [name] [name] ([id number]) Microscopic Comment(continued) Ki-67: 80% TNM: mpT1c, pN0 Comments: In addition to the two grossly identified nodules, there is invasive carcinoma present in random tissue submitted from the inferior lateral quadrant. The three foci of carcinoma are morphologically similar.   RADIOGRAPHIC STUDIES:  Nm Pet Image Initial (pi) Skull Base To Thigh  05/12/2012  *RADIOLOGY REPORT*  Clinical Data: Initial treatment strategy for breast cancer.   NUCLEAR MEDICINE PET SKULL BASE TO THIGH  Fasting Blood Glucose:  143  Technique:  20 mCi F-18 FDG was injected intravenously. CT data was obtained and used for attenuation correction and anatomic localization only.  (This was not acquired as a diagnostic CT examination.) Additional exam technical data entered on technologist worksheet.  Comparison:  none  Findings:  Neck: No hypermetabolic lymph nodes in the neck.  Chest:  In the inferior medial aspect of the right breast there is a 10 mm nodule (image 99) with moderate  metabolic activity ( SUV max = 2.9).  No hypermetabolic mediastinal or hilar lymph nodes.  There  is hypermetabolic activity in the thoracic inlet which is felt to be vascular in nature.  There is activity adjacent to the thoracic aorta which is also felt to be vascular may relate to the hemiazygos vein.  Abdomen/Pelvis:  No abnormal hypermetabolic activity within the liver, pancreas, adrenal glands, or spleen.  No hypermetabolic lymph nodes in the abdomen or pelvis.  Skeleton:  No focal hypermetabolic activity to suggest skeletal metastasis.  IMPRESSION:  1.  No evidence of distant breast cancer metastasis. 2.  Hypermetabolic nodule within the inferior medial right breast   Original Report Authenticated By: Suzy Bouchard, M.D.    Nm Sentinel Node Inj-no Rpt (breast)  05/14/2012  CLINICAL DATA: cancer left breast   Sulfur colloid was injected intradermally by the nuclear medicine  technologist for breast cancer sentinel node localization.     Dg Chest Port 1 View  05/14/2012  *RADIOLOGY REPORT*  Clinical Data: Status post Port-A-Cath placement  PORTABLE CHEST - 1 VIEW  Comparison: None.  Findings: The cardiac shadow is within normal limits.  Bilateral tissue expanders as well as surgical drains are identified.  A right chest wall port is seen. The catheter tip is at the cavoatrial junction.  No pneumothorax is noted.  The lungs are clear bilaterally.  IMPRESSION: No evidence of pneumothorax following port placement.  Bilateral tissue expanders.   Original Report Authenticated By: Inez Catalina, M.D.    Dg Fluoro Guide Cv Line-no Report  05/14/2012  CLINICAL DATA: LEFT BREAST CANCER/PORT-A-CATH   FLOURO GUIDE CV LINE  Fluoroscopy was utilized by the requesting physician.  No radiographic  interpretation.      ASSESSMENT: 63 year old female with  #1 bilateral breast cancers on the right side she has triple-negative disease stage I left side stage I ER positive PR positive HER-2/neu positive disease. Patient is status post bilateral mastectomies. Of note on the right side the lymph node removed was  not a sentinel lymph node. It is unclear what to do with this side in terms of whether or not to do further sentinel lymph node. Clinically patient seems to be doing well.  #2status post Adriamycin Cytoxan given every 2 weeks for a total of 4 cycles followed by  Taxol and Herceptin given weekly, however after 2-3 cycles patient could not tolerate the Taxol and therefore her treatment was changed to Abraxane and Herceptin. She completed Abraxane Herceptin combination on 10 /2014.  #3 patient to begin adjuvant antiestrogen therapy consisting of Letrozole 2.5 mg daily starting 01/05/2013. Total of 5 years of therapy is planned.  #4 she began adjuvant Herceptin every 3 weeks to finish out a total of one year of therapy.  #5 arthritis: Likely due to letrozole have encouraged her to exercise.  #6 osteopenia: We discussed possibility of starting her on Zometa or prolia. She is gong to be seeing her dentist very soon for further evaluation regarding her job.  PLAN:  #1 patient will proceed with Herceptin today.  #2 she will have next MUGA scan performed in April 2015.  #3 she will return in March for her next Herceptin.  All questions were answered. The patient knows to call the clinic with any problems, questions or concerns. We can certainly see the patient much sooner if necessary.  I spent 25 minutes counseling the patient face to face. The total time spent in the appointment was 30 minutes.  Marcy Panning, MD Medical/Oncology [facility] [phone] (beeper) [phone] (Office)  03/29/2013, 8:36 AM

## 2013-03-29 NOTE — Patient Instructions (Signed)
[facility] Discharge Instructions for Patients Receiving Chemotherapy  Today you received the following biotherapy agent: Herceptin  To help prevent nausea and vomiting after your treatment, we encourage you to take your nausea medication as prescribed.   If you develop nausea and vomiting that is not controlled by your nausea medication, call the clinic.   BELOW ARE SYMPTOMS THAT SHOULD BE REPORTED IMMEDIATELY:  *FEVER GREATER THAN 100.5 F  *CHILLS WITH OR WITHOUT FEVER  NAUSEA AND VOMITING THAT IS NOT CONTROLLED WITH YOUR NAUSEA MEDICATION  *UNUSUAL SHORTNESS OF BREATH  *UNUSUAL BRUISING OR BLEEDING  TENDERNESS IN MOUTH AND THROAT WITH OR WITHOUT PRESENCE OF ULCERS  *URINARY PROBLEMS  *BOWEL PROBLEMS  UNUSUAL RASH Items with * indicate a potential emergency and should be followed up as soon as possible.  Feel free to call the clinic you have any questions or concerns. The clinic phone number is [phone].

## 2013-03-29 NOTE — Telephone Encounter (Signed)
Per staff message and POF I have scheduled appts.  JMW  

## 2013-04-06 ENCOUNTER — Encounter (INDEPENDENT_AMBULATORY_CARE_PROVIDER_SITE_OTHER): Payer: Self-pay | Admitting: General Surgery

## 2013-04-20 ENCOUNTER — Ambulatory Visit (HOSPITAL_BASED_OUTPATIENT_CLINIC_OR_DEPARTMENT_OTHER): Payer: BC Managed Care – PPO

## 2013-04-20 ENCOUNTER — Telehealth: Payer: Self-pay | Admitting: Oncology

## 2013-04-20 ENCOUNTER — Other Ambulatory Visit (HOSPITAL_BASED_OUTPATIENT_CLINIC_OR_DEPARTMENT_OTHER): Payer: BC Managed Care – PPO

## 2013-04-20 ENCOUNTER — Ambulatory Visit (HOSPITAL_BASED_OUTPATIENT_CLINIC_OR_DEPARTMENT_OTHER): Payer: BC Managed Care – PPO | Admitting: Oncology

## 2013-04-20 ENCOUNTER — Telehealth: Payer: Self-pay | Admitting: *Deleted

## 2013-04-20 ENCOUNTER — Encounter: Payer: Self-pay | Admitting: Oncology

## 2013-04-20 VITALS — BP 139/85 | HR 80 | Temp 99.0°F | Resp 18 | Ht 64.0 in | Wt 196.6 lb

## 2013-04-20 DIAGNOSIS — Z170 Estrogen receptor positive status [ER+]: Secondary | ICD-10-CM

## 2013-04-20 DIAGNOSIS — R5381 Other malaise: Secondary | ICD-10-CM

## 2013-04-20 DIAGNOSIS — C50119 Malignant neoplasm of central portion of unspecified female breast: Secondary | ICD-10-CM

## 2013-04-20 DIAGNOSIS — R5383 Other fatigue: Secondary | ICD-10-CM

## 2013-04-20 DIAGNOSIS — C50919 Malignant neoplasm of unspecified site of unspecified female breast: Secondary | ICD-10-CM

## 2013-04-20 DIAGNOSIS — C50912 Malignant neoplasm of unspecified site of left female breast: Secondary | ICD-10-CM

## 2013-04-20 DIAGNOSIS — C50911 Malignant neoplasm of unspecified site of right female breast: Secondary | ICD-10-CM

## 2013-04-20 DIAGNOSIS — Z5112 Encounter for antineoplastic immunotherapy: Secondary | ICD-10-CM

## 2013-04-20 DIAGNOSIS — M129 Arthropathy, unspecified: Secondary | ICD-10-CM

## 2013-04-20 LAB — CBC WITH DIFFERENTIAL/PLATELET
BASO%: 1.2 % (ref 0.0–2.0)
Basophils Absolute: 0.1 10*3/uL (ref 0.0–0.1)
EOS%: 2.3 % (ref 0.0–7.0)
Eosinophils Absolute: 0.1 10*3/uL (ref 0.0–0.5)
HCT: 39.6 % (ref 34.8–46.6)
HGB: 12.9 g/dL (ref 11.6–15.9)
LYMPH%: 24.5 % (ref 14.0–49.7)
MCH: 27 pg (ref 25.1–34.0)
MCHC: 32.6 g/dL (ref 31.5–36.0)
MCV: 82.9 fL (ref 79.5–101.0)
MONO#: 0.5 10*3/uL (ref 0.1–0.9)
MONO%: 11.4 % (ref 0.0–14.0)
NEUT#: 2.7 10*3/uL (ref 1.5–6.5)
NEUT%: 60.6 % (ref 38.4–76.8)
Platelets: 301 10*3/uL (ref 145–400)
RBC: 4.78 10*6/uL (ref 3.70–5.45)
RDW: 15.2 % — ABNORMAL HIGH (ref 11.2–14.5)
WBC: 4.5 10*3/uL (ref 3.9–10.3)
lymph#: 1.1 10*3/uL (ref 0.9–3.3)

## 2013-04-20 LAB — COMPREHENSIVE METABOLIC PANEL (CC13)
ALT: 20 U/L (ref 0–55)
AST: 21 U/L (ref 5–34)
Albumin: 3.8 g/dL (ref 3.5–5.0)
Alkaline Phosphatase: 100 U/L (ref 40–150)
Anion Gap: 11 mEq/L (ref 3–11)
BUN: 13.5 mg/dL (ref 7.0–26.0)
CO2: 30 mEq/L — ABNORMAL HIGH (ref 22–29)
Calcium: 10.1 mg/dL (ref 8.4–10.4)
Chloride: 103 mEq/L (ref 98–109)
Creatinine: 0.7 mg/dL (ref 0.6–1.1)
Glucose: 116 mg/dl (ref 70–140)
Potassium: 3.5 mEq/L (ref 3.5–5.1)
Sodium: 144 mEq/L (ref 136–145)
Total Bilirubin: 0.23 mg/dL (ref 0.20–1.20)
Total Protein: 7.1 g/dL (ref 6.4–8.3)

## 2013-04-20 MED ORDER — DIPHENHYDRAMINE HCL 25 MG PO CAPS
50.0000 mg | ORAL_CAPSULE | Freq: Once | ORAL | Status: AC
  Administered 2013-04-20: 50 mg via ORAL
  Filled 2013-04-20: qty 2

## 2013-04-20 MED ORDER — ACETAMINOPHEN 325 MG PO TABS
650.0000 mg | ORAL_TABLET | Freq: Once | ORAL | Status: AC
  Administered 2013-04-20: 650 mg via ORAL

## 2013-04-20 MED ORDER — SODIUM CHLORIDE 0.9 % IV SOLN
Freq: Once | INTRAVENOUS | Status: AC
  Administered 2013-04-20: 09:00:00 via INTRAVENOUS

## 2013-04-20 MED ORDER — SODIUM CHLORIDE 0.9 % IV SOLN
6.0000 mg/kg | Freq: Once | INTRAVENOUS | Status: AC
  Administered 2013-04-20: 525 mg via INTRAVENOUS
  Filled 2013-04-20: qty 25

## 2013-04-20 MED ORDER — ACETAMINOPHEN 325 MG PO TABS
ORAL_TABLET | ORAL | Status: AC
  Filled 2013-04-20: qty 2

## 2013-04-20 MED ORDER — HEPARIN SOD (PORK) LOCK FLUSH 100 UNIT/ML IV SOLN
500.0000 [IU] | Freq: Once | INTRAVENOUS | Status: AC | PRN
  Administered 2013-04-20: 500 [IU]
  Filled 2013-04-20: qty 5

## 2013-04-20 MED ORDER — SODIUM CHLORIDE 0.9 % IJ SOLN
10.0000 mL | INTRAMUSCULAR | Status: DC | PRN
  Administered 2013-04-20: 10 mL
  Filled 2013-04-20: qty 10

## 2013-04-20 NOTE — Telephone Encounter (Signed)
, °

## 2013-04-20 NOTE — Patient Instructions (Signed)
[facility] Discharge Instructions for Patients Receiving Chemotherapy  Today you received the following chemotherapy agents Herceptin.  To help prevent nausea and vomiting after your treatment, we encourage you to take your nausea medication as prescribed.    If you develop nausea and vomiting that is not controlled by your nausea medication, call the clinic.   BELOW ARE SYMPTOMS THAT SHOULD BE REPORTED IMMEDIATELY:  *FEVER GREATER THAN 100.5 F  *CHILLS WITH OR WITHOUT FEVER  NAUSEA AND VOMITING THAT IS NOT CONTROLLED WITH YOUR NAUSEA MEDICATION  *UNUSUAL SHORTNESS OF BREATH  *UNUSUAL BRUISING OR BLEEDING  TENDERNESS IN MOUTH AND THROAT WITH OR WITHOUT PRESENCE OF ULCERS  *URINARY PROBLEMS  *BOWEL PROBLEMS  UNUSUAL RASH Items with * indicate a potential emergency and should be followed up as soon as possible.  Feel free to call the clinic should you have any questions or concerns. The clinic phone number is [phone].  It was my pleasure to take care of you today!  Thank you!  Leeanne Rio, RN

## 2013-04-20 NOTE — Telephone Encounter (Signed)
Per staff message and POF I have scheduled appts.  JMW  

## 2013-04-24 NOTE — Progress Notes (Signed)
OFFICE PROGRESS NOTE  CC  [name] Greenland, MD [address] [place]  [address] Dr. 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 Patient is status post bilateral mastectomy with immediate reconstruction  STAGE:  Right breast (mastectomy)  T1cN0 1.2 cm ER-/PR-/her-/ki-67 92% 0/1 nodes positive  Left breast (mastectomy with snl) T1cN0 1.4 cm, 1.2 cm, 0.1c ER 100%, PR 11%, her2neu positive 6.50, Ki-67 80%  Breast cancer, right   Primary site: Breast (Bilateral)   Staging method: AJCC 7th Edition   Clinical: Stage IA (T1, N0, cM0)   Pathologic: Stage IA (T1, N0, cM0) signed by [name] Robinson, MD on 04/24/2013  3:58 PM   Summary: Stage IA (T1, N0, cM0)  PRIOR THERAPY:  #1screening mammogram performed that showed 2 areas of concern on the left side.calcifications were noted in the left. In the right breast a possible mass warranted further evaluation.   #2 On 03/29/2012 patient underwent a bilateral diagnostic mammogram and right breast ultrasound. A spot magnification images demonstrate suspicious group of pleomorphic microcalcifications over the outer lower left breast with additional suspicious group of pleomorphic microcalcifications over the outer midportion of the left periareolar region. Spot compression images of the right breast demonstrate persistence of a 1 cm density at the edge of the film in the deep third of the right inner breast.   #3Ultrasound performed showed no focal abnormality over the entire in her right breast to correspond to the mammographic density. Patient was recommended stereotactic core needle biopsy of the 2 groups of suspicious left breast microcalcifications. Because patient and her husband wanted bilateral mastectomies MRI was not performed.on 04/15/2012 patient had needle core  biopsy performed of the 2 areas in the left breast. The left needle core biopsy in the lower breast revealed ductal carcinoma in situ with associated comedo necrosis and calcifications with the in situ carcinoma. It was ER +100% PR +12%. 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 Tumor was ER +100% PR +11% proliferation marker Ki-67 elevated at 80% HER-2/neu showed amplification with a ratio of 6.50.   #4 patient is now status post bilateral mastectomies performed on 05/14/2012 with immediate reconstruction. The final pathology revealed bilateral breast cancers. In the right breast tissue is noted to have a 1.2 cm invasive ductal carcinoma high-grade ER negative PR negative HER-2/neu negative with Ki-67 92% one node was negative for metastatic disease but this was not the sentinel lymph node. On the left side the patient had multifocal disease measuring 1.4 cm, 1.2 cm, 0.1 cm. Tumor was ER +100% PR +11% HER-2/neu positive with a ratio 6.50. Ki-67 was 80% 8 sentinel nodes were negative for metastatic disease.  #5 patient has had immediate reconstruction with expanders bilaterally.  #6s/p adjuvant chemotherapy initially consisting of Adriamycin Cytoxan every 2 weeks for a total of 4 cycles. Given 07/02/12 - 08/12/12  #7This will then be followed by Taxol and Herceptin to be given weekly for 12 weeks time.beginning 08/27/12. This was discontinued due to neuropathy.  #8 Abraxane and Herceptin days 1, day 8, day 15 on a 28 day cycle. Beginning 09/03/2012 - 11/2012  #9 curative intent adjuvant Herceptin every 3 weeks beginning 01/05/2013  #10 curative intent adjuvant letrozole 2.5 mg daily starting 01/05/2013. Total of 5 years of therapy is planned   CURRENT  THERAPY: adjuvant curative intent Herceptin every 3 weeks, daily Letrozole  INTERVAL HISTORY: [name] [name] 63 y.o. female returns for followup visit.   Patient is doing well  clinically. She does have joint pain especially in the knees. She does complain of fatigue. She is experiencing hot flashes since being on letrozole. She had a bone density scan performed which reveals osteopenia.She denies any headaches double vision blurring of vision back pain no fevers or chills. No bleeding problems. She does have fatigue. Remainder of the 10 point review of systems is negative.   MEDICAL HISTORY: Past Medical History  Diagnosis Date  . Heart murmur   . Diabetes mellitus without complication   . Osteopenia   . Cancer     breast  . Bronchitis   . Pneumonia   . Hiatal hernia   . Sinus problem   . Diabetes mellitus   . Breast cancer 04/15/12    left-biopsy  . Breast cancer, right breast 05/14/12    right mastectomy  . CHF with unknown LVEF 03/09/2013    ALLERGIES:  is allergic to darvon and penicillins.  MEDICATIONS:  Current Outpatient Prescriptions  Medication Sig Dispense Refill  . Ascorbic Acid (VITAMIN C) 500 MG tablet Take 500 mg by mouth daily.        Marland Kitchen azithromycin (ZITHROMAX Z-PAK) 250 MG tablet Take 2 today then 1 a day until completed  6 each  1  . Biotin 10 MG TABS Take by mouth. 4000 mcg daily      . calcitonin, salmon, (MIACALCIN/FORTICAL) 200 UNIT/ACT nasal spray Place 1 spray into the nose daily.      . Calcium Citrate (CITRACAL PO) Take by mouth.      . Cholecalciferol (VITAMIN D PO) Take 1 tablet by mouth daily.      . fluticasone (FLONASE) 50 MCG/ACT nasal spray Place 2 sprays into the nose daily.  16 g  2  . gabapentin (NEURONTIN) 100 MG capsule Take 1 capsule at bedtime  30 capsule  4  . KRILL OIL PO Take by mouth.      . letrozole (FEMARA) 2.5 MG tablet Take 1 tablet (2.5 mg total) by mouth daily.  90 tablet  12  . lidocaine-prilocaine (EMLA) cream Apply topically as needed.  30 g  6  . Multiple Vitamin (MULTIVITAMIN PO) Take 1 tablet by mouth daily.       . pantoprazole (PROTONIX) 40 MG tablet Take 40 mg by mouth daily.        .  Probiotic Product (PROBIOTIC DAILY PO) Take by mouth daily.      . rosuvastatin (CRESTOR) 5 MG tablet Take 5 mg by mouth every Monday, Wednesday, and Friday.      . sitaGLIPtin (JANUVIA) 100 MG tablet Take 100 mg by mouth daily.      Marland Kitchen UNABLE TO FIND Apply 1 Device topically once. Cranial prosthesis r/t chemotherapy treatment  1 Device  0   No current facility-administered medications for this visit.    SURGICAL HISTORY:  Past Surgical History  Procedure Laterality Date  . Cesarean section  1981, 1983  . Tummy tuck  1999  . Tonsillectomy    . Foot surgery    . Gastric bypass    . Wound debridement  01/14/2011    Procedure: DEBRIDEMENT ABDOMINAL WOUND;  Surgeon: Adin Hector, MD;  Location: [facility];  Service: General;  Laterality: N/A;  wound debridement and debridement on the abdomen  . Cosmetic surgery    .  Total mastectomy Bilateral 05/14/2012    Procedure: TOTAL MASTECTOMY;  Surgeon: Adin Hector, MD;  Location: [facility];  Service: General;  Laterality: Bilateral;  . Axillary sentinel node biopsy Left 05/14/2012    Procedure: AXILLARY SENTINEL NODE BIOPSY;  Surgeon: Adin Hector, MD;  Location: [facility];  Service: General;  Laterality: Left;  . Portacath placement N/A 05/14/2012    Procedure: INSERTION PORT-A-CATH;  Surgeon: Adin Hector, MD;  Location: [facility];  Service: General;  Laterality: N/A;  . Breast reconstruction with placement of tissue expander and flex hd (acellular hydrated dermis) Bilateral 05/14/2012    Procedure: BREAST RECONSTRUCTION WITH PLACEMENT OF TISSUE EXPANDER AND FLEX HD (ACELLULAR HYDRATED DERMIS);  Surgeon: Crissie Reese, MD;  Location: [facility];  Service: Plastics;  Laterality: Bilateral;  . Bunionectomy      REVIEW OF SYSTEMS:  A 10 point review of systems was conducted and is otherwise negative except for what is noted above.    PHYSICAL EXAMINATION: Blood pressure 139/85, pulse 80, temperature 99 F (37.2 C), temperature source  Oral, resp. rate 18, height '5\' 4"'  (1.626 m), weight 196 lb 9.6 oz (89.177 kg). Body mass index is 33.73 kg/(m^2). GENERAL: Patient is a well appearing female in no acute distress HEENT:  Sclerae anicteric.  Oropharynx clear and moist. No ulcerations or evidence of oropharyngeal candidiasis. Neck is supple.  NODES:  No cervical, supraclavicular, or axillary lymphadenopathy palpated.  BREAST EXAM:  Deferred. LUNGS:  Clear to auscultation bilaterally.  No wheezes or rhonchi. HEART:  Regular rate and rhythm. No murmur appreciated. ABDOMEN:  Soft, nontender.  Positive, normoactive bowel sounds. No organomegaly palpated. MSK:  No focal spinal tenderness to palpation. Full range of motion bilaterally in the upper extremities. EXTREMITIES:  No peripheral edema.   SKIN:  Clear with no obvious rashes or skin changes. No nail dyscrasia. NEURO:  Nonfocal. Well oriented.  Appropriate affect. ECOG PERFORMANCE STATUS: 0 - Asymptomatic  LABORATORY DATA: Lab Results  Component Value Date   WBC 4.5 04/20/2013   HGB 12.9 04/20/2013   HCT 39.6 04/20/2013   MCV 82.9 04/20/2013   PLT 301 04/20/2013      Chemistry      Component Value Date/Time   NA 144 04/20/2013 0814   NA 139 05/17/2012 0530   K 3.5 04/20/2013 0814   K 3.7 05/17/2012 0530   CL 104 07/30/2012 0955   CL 100 05/17/2012 0530   CO2 30* 04/20/2013 0814   CO2 33* 05/17/2012 0530   BUN 13.5 04/20/2013 0814   BUN 4* 05/17/2012 0530   CREATININE 0.7 04/20/2013 0814   CREATININE 0.56 05/17/2012 0530      Component Value Date/Time   CALCIUM 10.1 04/20/2013 0814   CALCIUM 8.7 05/17/2012 0530   ALKPHOS 100 04/20/2013 0814   ALKPHOS 103 05/10/2012 1440   AST 21 04/20/2013 0814   AST 25 05/10/2012 1440   ALT 20 04/20/2013 0814   ALT 17 05/10/2012 1440   BILITOT 0.23 04/20/2013 0814   BILITOT 0.4 05/10/2012 1440    ADDITIONAL INFORMATION: 6. PROGNOSTIC INDICATORS - ACIS Results: IMMUNOHISTOCHEMICAL AND MORPHOMETRIC ANALYSIS BY THE AUTOMATED CELLULAR IMAGING SYSTEM  (ACIS) Estrogen Receptor: 0%, NEGATIVE Progesterone Receptor: 0%, NEGATIVE Proliferation Marker Ki67: 92% COMMENT: The negative hormone receptor study(ies) in this case have an internal positive control. REFERENCE RANGE ESTROGEN RECEPTOR NEGATIVE <1% POSITIVE =>1% PROGESTERONE RECEPTOR NEGATIVE <1% POSITIVE =>1% All controls stained appropriately Enid Cutter MD Pathologist, Electronic Signature ( Signed 05/21/2012) 6. CHROMOGENIC IN-SITU  HYBRIDIZATION Results: HER-2/NEU BY CISH - NO AMPLIFICATION OF HER-2 DETECTED. 1 of 6 Duplicate copy FINAL for Brass, [name] G [phone]) ADDITIONAL INFORMATION:(continued) RESULT RATIO OF HER2: CEP 17 SIGNALS 1.19 AVERAGE HER2 COPY NUMBER PER CELL 1.85 REFERENCE RANGE NEGATIVE HER2/Chr17 Ratio <2.0 and Average HER2 copy number <4.0 EQUIVOCAL HER2/Chr17 Ratio <2.0 and Average HER2 copy number 4.0 and <6.0 POSITIVE HER2/Chr17 Ratio >=2.0 and/or Average HER2 copy number >=6.0 Enid Cutter MD Pathologist, Electronic Signature ( Signed 05/20/2012) FINAL DIAGNOSIS Diagnosis 1. Lymph node, sentinel, biopsy, Left axillary - THERE IS NO EVIDENCE OF CARCINOMA IN 1 OF 1 LYMPH NODE (0/1). 2. Lymph node, sentinel, biopsy, Left axillary - THERE IS NO EVIDENCE OF CARCINOMA IN 1 OF 1 LYMPH NODE (0/1). 3. Lymph node, sentinel, biopsy, Left axillary - THERE IS NO EVIDENCE OF CARCINOMA IN 1 OF 1 LYMPH NODE (0/1). 4. Lymph node, sentinel, biopsy, Left axillary - THERE IS NO EVIDENCE OF CARCINOMA IN 1 OF 1 LYMPH NODE (0/1). 5. Lymph node, sentinel, biopsy, Left axillary - THERE IS NO EVIDENCE OF CARCINOMA IN 1 OF 1 LYMPH NODE (0/1). 6. Breast, simple mastectomy, Right - INVASIVE DUCTAL CARCINOMA, GRADE III/III, SPANNING 1.2 CM. - DUCTAL CARCINOMA IN SITU, HIGH GRADE. - LYMPHOVASCULAR INVASION IS IDENTIFIED. - THERE IS NO EVIDENCE OF CARCINOMA IN 1 OF 1 LYMPH NODE (0/1). - SEE ONCOLOGY TABLE BELOW. 7. Breast, simple mastectomy, Left - INVASIVE  DUCTAL CARCINOMA, AT LEAST THREE FOCI, GRADE II/III, SPANNING 1.4 CM, 1.2 CM, AND 0.1 CM. - DUCTAL CARCINOMA IN SITU, INTERMEDIATE GRADE. - LYMPHOVASCULAR INVASION IS IDENTIFIED. - DUCTAL CARCINOMA IN SITU IS FOCALLY 0.2 CM TO THE ANTERIOR/INFERIOR SOFT TISSUE RESECTION MARGIN. - LOBULAR NEOPLASIA (LOBULAR CARCINOMA IN SITU). - THERE IS NO EVIDENCE OF CARCINOMA IN 2 OF 2 LYMPH NODES (0/2). - SEE ONCOLOGY TABLE BELOW. 8. Lymph node, biopsy, Left axillary - THERE IS NO EVIDENCE OF CARCINOMA IN 1 OF 1 LYMPH NODE (0/1). Microscopic Comment 6. BREAST, INVASIVE TUMOR, WITH LYMPH NODE SAMPLING Specimen, including laterality: Right breast Procedure: Simple mastectomy Grade: 3 2 of 6 Duplicate copy FINAL for Penagos, [name] G ([id number]) Microscopic Comment(continued) Tubule formation: 3 Nuclear pleomorphism: 3 Mitotic:2 Tumor size (gross measurement): 1.2 cm Margins: Invasive, distance to closest margin: 1.3 cm to the deep margin (gross measurement) In-situ, distance to closest margin: 1.3 cm to the deep margin (gross measurement) Lymphovascular invasion: Present Ductal carcinoma in situ: Present Grade: High grade Extensive intraductal component: Not identified Lobular neoplasia: Not identified in specimen #6 Tumor focality: Unifocal Treatment effect: N/A Extent of tumor: Confined to breast parenchyma Lymph nodes: # examined: 1 Lymph nodes with metastasis: 0 Breast prognostic profile: Will be performed on the current case and the results reported separately. TNM: pT1c, pN0 7. BREAST, INVASIVE TUMOR, WITH LYMPH NODE SAMPLING Specimen, including laterality: Left breast Procedure: Simple mastectomy Grade: 2 Tubule formation: 3 Nuclear pleomorphism: 2 Mitotic:2 Tumor size (gross measurements) 1.4 cm, 1.2 cm, and 0.1 cm Margins: Invasive, distance to closest margin: 0.7 cm to the anterior/inferior soft tissue resection margin In-situ, distance to closest margin: Focally 0.2 cm  to the anterior/inferior soft tissue resection margin (glass slide measurement) Lymphovascular invasion: Present Ductal carcinoma in situ: Present Grade: Intermediate grade Extensive intraductal component: Yes Lobular neoplasia: Yes (lobular carcinoma in situ) Tumor focality: Multiple foci Treatment effect: N/A Extent of tumor: Confined to breast parenchyma Lymph nodes: # examined: 8 Lymph nodes with metastasis: 0 Breast prognostic profile: [phone] Estrogen receptor: 100% strong staining intensity Progesterone receptor: 11% strong  staining intensity Her 2 neu: Amplification was detected. The ratio was 2.95. 3 of 6 Duplicate copy FINAL for Leitz, [name] G ([id number]) Microscopic Comment(continued) Ki-67: 80% TNM: mpT1c, pN0 Comments: In addition to the two grossly identified nodules, there is invasive carcinoma present in random tissue submitted from the inferior lateral quadrant. The three foci of carcinoma are morphologically similar.   RADIOGRAPHIC STUDIES:  Nm Pet Image Initial (pi) Skull Base To Thigh  05/12/2012  *RADIOLOGY REPORT*  Clinical Data: Initial treatment strategy for breast cancer.   NUCLEAR MEDICINE PET SKULL BASE TO THIGH  Fasting Blood Glucose:  143  Technique:  20 mCi F-18 FDG was injected intravenously. CT data was obtained and used for attenuation correction and anatomic localization only.  (This was not acquired as a diagnostic CT examination.) Additional exam technical data entered on technologist worksheet.  Comparison:  none  Findings:  Neck: No hypermetabolic lymph nodes in the neck.  Chest:  In the inferior medial aspect of the right breast there is a 10 mm nodule (image 99) with moderate  metabolic activity ( SUV max = 2.9).  No hypermetabolic mediastinal or hilar lymph nodes.  There is hypermetabolic activity in the thoracic inlet which is felt to be vascular in nature.  There is activity adjacent to the thoracic aorta which is also felt to be  vascular may relate to the hemiazygos vein.  Abdomen/Pelvis:  No abnormal hypermetabolic activity within the liver, pancreas, adrenal glands, or spleen.  No hypermetabolic lymph nodes in the abdomen or pelvis.  Skeleton:  No focal hypermetabolic activity to suggest skeletal metastasis.  IMPRESSION:  1.  No evidence of distant breast cancer metastasis. 2.  Hypermetabolic nodule within the inferior medial right breast   Original Report Authenticated By: Suzy Bouchard, M.D.    Nm Sentinel Node Inj-no Rpt (breast)  05/14/2012  CLINICAL DATA: cancer left breast   Sulfur colloid was injected intradermally by the nuclear medicine  technologist for breast cancer sentinel node localization.     Dg Chest Port 1 View  05/14/2012  *RADIOLOGY REPORT*  Clinical Data: Status post Port-A-Cath placement  PORTABLE CHEST - 1 VIEW  Comparison: None.  Findings: The cardiac shadow is within normal limits.  Bilateral tissue expanders as well as surgical drains are identified.  A right chest wall port is seen. The catheter tip is at the cavoatrial junction.  No pneumothorax is noted.  The lungs are clear bilaterally.  IMPRESSION: No evidence of pneumothorax following port placement.  Bilateral tissue expanders.   Original Report Authenticated By: Inez Catalina, M.D.    Dg Fluoro Guide Cv Line-no Report  05/14/2012  CLINICAL DATA: LEFT BREAST CANCER/PORT-A-CATH   FLOURO GUIDE CV LINE  Fluoroscopy was utilized by the requesting physician.  No radiographic  interpretation.      ASSESSMENT: 63 year old female with  #1 bilateral breast cancers on the right side she has triple-negative disease stage I left side stage I ER positive PR positive HER-2/neu positive disease. Patient is status post bilateral mastectomies. Of note on the right side the lymph node removed was not a sentinel lymph node. It is unclear what to do with this side in terms of whether or not to do further sentinel lymph node. Clinically patient seems to be doing  well.  #2status post Adriamycin Cytoxan given every 2 weeks for a total of 4 cycles followed by Taxol and Herceptin given weekly, however after 2-3 cycles patient could not tolerate the Taxol and therefore her treatment was changed  to Abraxane and Herceptin. She completed Abraxane Herceptin combination on 10 /2014.  #3 patient to begin adjuvant antiestrogen therapy consisting of Letrozole 2.5 mg daily starting 01/05/2013. Total of 5 years of therapy is planned.  #4 she began adjuvant Herceptin every 3 weeks to finish out a total of one year of therapy.  #5 arthritis: Likely due to letrozole have encouraged her to exercise.  #6 osteopenia: We discussed possibility of starting her on Zometa or prolia. She is gong to be seeing her dentist very soon for further evaluation regarding her job.  PLAN:  #1 patient will proceed with Herceptin today.  #2 she will have next MUGA scan performed in April 2015.  #3 Follow up: she will be seen back in 3 weeks with office visit, blood work and next herceptin  All questions were answered. The patient knows to call the clinic with any problems, questions or concerns. We can certainly see the patient much sooner if necessary.  I spent 20 minutes counseling the patient face to face. The total time spent in the appointment was 30 minutes.  Marcy Panning, MD Medical/Oncology [facility] [phone] (beeper) [phone] (Office)

## 2013-04-26 ENCOUNTER — Ambulatory Visit (HOSPITAL_COMMUNITY): Payer: BC Managed Care – PPO

## 2013-05-18 ENCOUNTER — Encounter: Payer: Self-pay | Admitting: Gastroenterology

## 2013-05-18 ENCOUNTER — Ambulatory Visit (HOSPITAL_BASED_OUTPATIENT_CLINIC_OR_DEPARTMENT_OTHER): Payer: BC Managed Care – PPO | Admitting: Oncology

## 2013-05-18 ENCOUNTER — Encounter: Payer: Self-pay | Admitting: Oncology

## 2013-05-18 ENCOUNTER — Other Ambulatory Visit (HOSPITAL_BASED_OUTPATIENT_CLINIC_OR_DEPARTMENT_OTHER): Payer: BC Managed Care – PPO

## 2013-05-18 ENCOUNTER — Telehealth: Payer: Self-pay | Admitting: Oncology

## 2013-05-18 ENCOUNTER — Ambulatory Visit (HOSPITAL_BASED_OUTPATIENT_CLINIC_OR_DEPARTMENT_OTHER): Payer: BC Managed Care – PPO

## 2013-05-18 VITALS — BP 152/87 | HR 75 | Temp 97.8°F | Resp 18 | Ht 64.0 in | Wt 197.7 lb

## 2013-05-18 DIAGNOSIS — C50919 Malignant neoplasm of unspecified site of unspecified female breast: Secondary | ICD-10-CM

## 2013-05-18 DIAGNOSIS — R52 Pain, unspecified: Secondary | ICD-10-CM

## 2013-05-18 DIAGNOSIS — C50119 Malignant neoplasm of central portion of unspecified female breast: Secondary | ICD-10-CM

## 2013-05-18 DIAGNOSIS — C50911 Malignant neoplasm of unspecified site of right female breast: Secondary | ICD-10-CM

## 2013-05-18 DIAGNOSIS — R269 Unspecified abnormalities of gait and mobility: Secondary | ICD-10-CM

## 2013-05-18 DIAGNOSIS — M549 Dorsalgia, unspecified: Secondary | ICD-10-CM

## 2013-05-18 DIAGNOSIS — C50912 Malignant neoplasm of unspecified site of left female breast: Secondary | ICD-10-CM

## 2013-05-18 DIAGNOSIS — Z5112 Encounter for antineoplastic immunotherapy: Secondary | ICD-10-CM

## 2013-05-18 DIAGNOSIS — R42 Dizziness and giddiness: Secondary | ICD-10-CM

## 2013-05-18 LAB — COMPREHENSIVE METABOLIC PANEL (CC13)
ALT: 19 U/L (ref 0–55)
AST: 23 U/L (ref 5–34)
Albumin: 3.6 g/dL (ref 3.5–5.0)
Alkaline Phosphatase: 96 U/L (ref 40–150)
Anion Gap: 10 mEq/L (ref 3–11)
BUN: 11 mg/dL (ref 7.0–26.0)
CO2: 28 mEq/L (ref 22–29)
Calcium: 9.9 mg/dL (ref 8.4–10.4)
Chloride: 105 mEq/L (ref 98–109)
Creatinine: 0.7 mg/dL (ref 0.6–1.1)
Glucose: 144 mg/dl — ABNORMAL HIGH (ref 70–140)
Potassium: 4.2 mEq/L (ref 3.5–5.1)
Sodium: 143 mEq/L (ref 136–145)
Total Bilirubin: 0.39 mg/dL (ref 0.20–1.20)
Total Protein: 6.7 g/dL (ref 6.4–8.3)

## 2013-05-18 LAB — CBC WITH DIFFERENTIAL/PLATELET
BASO%: 0.6 % (ref 0.0–2.0)
Basophils Absolute: 0 10*3/uL (ref 0.0–0.1)
EOS%: 2.1 % (ref 0.0–7.0)
Eosinophils Absolute: 0.1 10*3/uL (ref 0.0–0.5)
HCT: 38.1 % (ref 34.8–46.6)
HGB: 12.2 g/dL (ref 11.6–15.9)
LYMPH%: 29.5 % (ref 14.0–49.7)
MCH: 26.8 pg (ref 25.1–34.0)
MCHC: 32.2 g/dL (ref 31.5–36.0)
MCV: 83.2 fL (ref 79.5–101.0)
MONO#: 0.5 10*3/uL (ref 0.1–0.9)
MONO%: 11.7 % (ref 0.0–14.0)
NEUT#: 2.5 10*3/uL (ref 1.5–6.5)
NEUT%: 56.1 % (ref 38.4–76.8)
Platelets: 244 10*3/uL (ref 145–400)
RBC: 4.58 10*6/uL (ref 3.70–5.45)
RDW: 14.7 % — ABNORMAL HIGH (ref 11.2–14.5)
WBC: 4.5 10*3/uL (ref 3.9–10.3)
lymph#: 1.3 10*3/uL (ref 0.9–3.3)

## 2013-05-18 MED ORDER — SODIUM CHLORIDE 0.9 % IV SOLN
Freq: Once | INTRAVENOUS | Status: AC
  Administered 2013-05-18: 09:00:00 via INTRAVENOUS

## 2013-05-18 MED ORDER — ACETAMINOPHEN 325 MG PO TABS
650.0000 mg | ORAL_TABLET | Freq: Once | ORAL | Status: AC
  Administered 2013-05-18: 650 mg via ORAL

## 2013-05-18 MED ORDER — DIPHENHYDRAMINE HCL 25 MG PO CAPS
50.0000 mg | ORAL_CAPSULE | Freq: Once | ORAL | Status: AC
  Administered 2013-05-18: 50 mg via ORAL

## 2013-05-18 MED ORDER — ACETAMINOPHEN 325 MG PO TABS
ORAL_TABLET | ORAL | Status: AC
  Filled 2013-05-18: qty 2

## 2013-05-18 MED ORDER — SODIUM CHLORIDE 0.9 % IJ SOLN
10.0000 mL | INTRAMUSCULAR | Status: DC | PRN
  Administered 2013-05-18: 10 mL
  Filled 2013-05-18: qty 10

## 2013-05-18 MED ORDER — DIPHENHYDRAMINE HCL 25 MG PO CAPS
ORAL_CAPSULE | ORAL | Status: AC
  Filled 2013-05-18: qty 2

## 2013-05-18 MED ORDER — TRASTUZUMAB CHEMO INJECTION 440 MG
6.0000 mg/kg | Freq: Once | INTRAVENOUS | Status: AC
  Administered 2013-05-18: 525 mg via INTRAVENOUS
  Filled 2013-05-18: qty 25

## 2013-05-18 MED ORDER — HEPARIN SOD (PORK) LOCK FLUSH 100 UNIT/ML IV SOLN
500.0000 [IU] | Freq: Once | INTRAVENOUS | Status: AC | PRN
  Administered 2013-05-18: 500 [IU]
  Filled 2013-05-18: qty 5

## 2013-05-18 NOTE — Patient Instructions (Signed)
Proceed with MRI of the back as well as brain MRI. We discussed the reasoning for it.  For GI issues I will refer you to Dr. Owens Loffler with gastroenterology.  I will see you back in 3 weeks or sooner if need arises

## 2013-05-18 NOTE — Progress Notes (Signed)
[facility] OFFICE PROGRESS NOTE  Patient Care Team: [name], MD as PCP - General (Internal Medicine) Dr. 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 Patient is status post bilateral mastectomy with immediate reconstruction  STAGE:  Right breast (mastectomy)  T1cN0  1.2 cm ER-/PR-/her-/ki-67 92%  0/1 nodes positive  Left breast (mastectomy with snl)  T1cN0  1.4 cm, 1.2 cm, 0.1c  ER 100%, PR 11%, her2neu positive 6.50, Ki-67 80%  Breast cancer, right  Primary site: Breast (Bilateral)  Staging method: AJCC 7th Edition  Clinical: Stage IA (T1, N0, cM0)  Pathologic: Stage IA (T1, N0, cM0) signed by Deatra Robinson, MD on 04/24/2013 3:58 PM  Summary: Stage IA (T1, N0, cM0)     SUMMARY OF ONCOLOGIC HISTORY: #1screening mammogram performed that showed 2 areas of concern on the left side.calcifications were noted in the left. In the right breast a possible mass warranted further evaluation.  #2 On 03/29/2012 patient underwent a bilateral diagnostic mammogram and right breast ultrasound. A spot magnification images demonstrate suspicious group of pleomorphic microcalcifications over the outer lower left breast with additional suspicious group of pleomorphic microcalcifications over the outer midportion of the left periareolar region. Spot compression images of the right breast demonstrate persistence of a 1 cm density at the edge of the film in the deep third of the right inner breast.  #3Ultrasound performed showed no focal abnormality over the entire in her right breast to correspond to the mammographic density. Patient was recommended stereotactic core needle biopsy of the 2 groups of suspicious left breast microcalcifications. Because patient and her husband wanted bilateral mastectomies MRI was not performed.on  04/15/2012 patient had needle core biopsy performed of the 2 areas in the left breast. The left needle core biopsy in the lower breast revealed ductal carcinoma in situ with associated comedo necrosis and calcifications with the in situ carcinoma. It was ER +100% PR +12%. 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 Tumor was ER +100% PR +11% proliferation marker Ki-67 elevated at 80% HER-2/neu showed amplification with a ratio of 6.50.  #4 patient is now status post bilateral mastectomies performed on 05/14/2012 with immediate reconstruction. The final pathology revealed bilateral breast cancers. In the right breast tissue is noted to have a 1.2 cm invasive ductal carcinoma high-grade ER negative PR negative HER-2/neu negative with Ki-67 92% one node was negative for metastatic disease but this was not the sentinel lymph node. On the left side the patient had multifocal disease measuring 1.4 cm, 1.2 cm, 0.1 cm. Tumor was ER +100% PR +11% HER-2/neu positive with a ratio 6.50. Ki-67 was 80% 8 sentinel nodes were negative for metastatic disease.  #5 patient has had immediate reconstruction with expanders bilaterally.  #6s/p adjuvant chemotherapy initially consisting of Adriamycin Cytoxan every 2 weeks for a total of 4 cycles. Given 07/02/12 - 08/12/12  #7This will then be followed by Taxol and Herceptin to be given weekly for 12 weeks time.beginning 08/27/12. This was discontinued due to neuropathy.  #8 Abraxane and Herceptin days 1, day 8, day 15 on a 28 day cycle. Beginning 09/03/2012 - 11/2012  #9 curative intent adjuvant Herceptin every 3 weeks beginning 01/05/2013  #10 curative intent adjuvant letrozole 2.5 mg daily starting 01/05/2013. Total of 5 years of therapy is planned  CURRENT THERAPY: adjuvant curative intent Herceptin every 3 weeks, daily Letrozole    INTERVAL HISTORY: [name] 63 y.o. female returns for followup prior  to treatment with Herceptin adjuvantly. Primary complaint today is lower back pain which is ongoing starting around the mid thorax. She also is complaining about having increasing headaches as well as some difficulty with walking. She has significant joint pain. She has not had any fevers chills or night sweats no shortness of breath chest pains or palpitations. No easy bruising or bleeding. She does have hot flashes which are tolerable. Remainder of the 10 point review of systems is negative and as below.  I have reviewed the past medical history, past surgical history, social history and family history with the patient and they are unchanged from previous note.  ALLERGIES:  is allergic to darvon and penicillins.  MEDICATIONS:  Current Outpatient Prescriptions  Medication Sig Dispense Refill  . Ascorbic Acid (VITAMIN C) 500 MG tablet Take 500 mg by mouth daily.        . Biotin 10 MG TABS Take by mouth. 4000 mcg daily      . Calcium Citrate (CITRACAL PO) Take by mouth.      . Cholecalciferol (VITAMIN D PO) Take 1 tablet by mouth daily.      . fluticasone (FLONASE) 50 MCG/ACT nasal spray Place 2 sprays into the nose daily.  16 g  2  . KRILL OIL PO Take by mouth.      . letrozole (FEMARA) 2.5 MG tablet Take 1 tablet (2.5 mg total) by mouth daily.  90 tablet  12  . lidocaine-prilocaine (EMLA) cream Apply topically as needed.  30 g  6  . Multiple Vitamin (MULTIVITAMIN PO) Take 1 tablet by mouth daily.       . pantoprazole (PROTONIX) 40 MG tablet Take 40 mg by mouth daily.        . pantoprazole (PROTONIX) 40 MG tablet Take 40 mg by mouth every other day.      . Probiotic Product (PROBIOTIC DAILY PO) Take by mouth daily.      . rosuvastatin (CRESTOR) 5 MG tablet Take 5 mg by mouth every Monday, Wednesday, and Friday.      . sitaGLIPtin (JANUVIA) 100 MG tablet Take 100 mg by mouth daily.      . calcitonin, salmon, (MIACALCIN/FORTICAL) 200 UNIT/ACT nasal spray Place 1 spray into the nose daily.       Marland Kitchen gabapentin (NEURONTIN) 100 MG capsule Take 1 capsule at bedtime  30 capsule  4  . UNABLE TO FIND Apply 1 Device topically once. Cranial prosthesis r/t chemotherapy treatment  1 Device  0   No current facility-administered medications for this visit.    REVIEW OF SYSTEMS:   Constitutional: Denies fevers, chills or abnormal weight loss Eyes: Denies blurriness of vision Ears, nose, mouth, throat, and face: Denies mucositis or sore throat Respiratory: Denies cough, dyspnea or wheezes Cardiovascular: Denies palpitation, chest discomfort or lower extremity swelling Gastrointestinal:  Denies nausea, heartburn or change in bowel habits Skin: Denies abnormal skin rashes Lymphatics: Denies new lymphadenopathy or easy bruising Neurological:Denies numbness, tingling or new weaknesses Behavioral/Psych: Mood is stable, no new changes    PHYSICAL EXAMINATION: ECOG PERFORMANCE STATUS: 1 - Symptomatic but completely ambulatory  Filed Vitals:   05/18/13 0825  BP: 152/87  Pulse: 75  Temp: 97.8 F (36.6 C)  Resp: 18   Filed Weights   05/18/13 0825  Weight: 197 lb 11.2 oz (89.676 kg)  GENERAL:alert, no distress and comfortable SKIN: skin color, texture, turgor are normal, no rashes or significant lesions EYES: normal, Conjunctiva are pink and non-injected, sclera clear OROPHARYNX:no exudate, no erythema and lips, buccal mucosa, and tongue normal  NECK: supple, thyroid normal size, non-tender, without nodularity LYMPH:  no palpable lymphadenopathy in the cervical, axillary or inguinal LUNGS: clear to auscultation and percussion with normal breathing effort HEART: regular rate & rhythm and no murmurs and no lower extremity edema ABDOMEN:abdomen soft, non-tender and normal bowel sounds Musculoskeletal:no cyanosis of digits and no clubbing  NEURO: alert & oriented x 3 with fluent speech, no focal motor/sensory deficits  LABORATORY DATA:  I have reviewed the data as listed    Component  Value Date/Time   NA 144 04/20/2013 0814   NA 139 05/17/2012 0530   K 3.5 04/20/2013 0814   K 3.7 05/17/2012 0530   CL 104 07/30/2012 0955   CL 100 05/17/2012 0530   CO2 30* 04/20/2013 0814   CO2 33* 05/17/2012 0530   GLUCOSE 116 04/20/2013 0814   GLUCOSE 138* 07/30/2012 0955   GLUCOSE 132* 05/17/2012 0530   BUN 13.5 04/20/2013 0814   BUN 4* 05/17/2012 0530   CREATININE 0.7 04/20/2013 0814   CREATININE 0.56 05/17/2012 0530   CALCIUM 10.1 04/20/2013 0814   CALCIUM 8.7 05/17/2012 0530   PROT 7.1 04/20/2013 0814   PROT 7.4 05/10/2012 1440   ALBUMIN 3.8 04/20/2013 0814   ALBUMIN 4.1 05/10/2012 1440   AST 21 04/20/2013 0814   AST 25 05/10/2012 1440   ALT 20 04/20/2013 0814   ALT 17 05/10/2012 1440   ALKPHOS 100 04/20/2013 0814   ALKPHOS 103 05/10/2012 1440   BILITOT 0.23 04/20/2013 0814   BILITOT 0.4 05/10/2012 1440   GFRNONAA >90 05/17/2012 0530   GFRAA >90 05/17/2012 0530    No results found for this basename: SPEP, UPEP,  kappa and lambda light chains    Lab Results  Component Value Date   WBC 4.5 05/18/2013   NEUTROABS 2.5 05/18/2013   HGB 12.2 05/18/2013   HCT 38.1 05/18/2013   MCV 83.2 05/18/2013   PLT 244 05/18/2013      Chemistry      Component Value Date/Time   NA 144 04/20/2013 0814   NA 139 05/17/2012 0530   K 3.5 04/20/2013 0814   K 3.7 05/17/2012 0530   CL 104 07/30/2012 0955   CL 100 05/17/2012 0530   CO2 30* 04/20/2013 0814   CO2 33* 05/17/2012 0530   BUN 13.5 04/20/2013 0814   BUN 4* 05/17/2012 0530   CREATININE 0.7 04/20/2013 0814   CREATININE 0.56 05/17/2012 0530      Component Value Date/Time   CALCIUM 10.1 04/20/2013 0814   CALCIUM 8.7 05/17/2012 0530   ALKPHOS 100 04/20/2013 0814   ALKPHOS 103 05/10/2012 1440   AST 21 04/20/2013 0814   AST 25 05/10/2012 1440   ALT 20 04/20/2013 0814   ALT 17 05/10/2012 1440   BILITOT 0.23 04/20/2013 0814   BILITOT 0.4 05/10/2012 1440       RADIOGRAPHIC STUDIES: I have personally reviewed the radiological images as listed and agreed with the findings in the report. No results  found.    ASSESSMENT & PLAN:  63 year old female with  #1 history of bilateral breast cancers on the right she had triple-negative stage I disease on the left stage I ER/PR positive HER-2/neu positive disease. She underwent bilateral mastectomies with reconstruction. She subsequently received adjuvant chemotherapy consisting of  Adriamycin Cytoxan given dose dense for 4 cycles followed by Taxol Herceptin weekly for 2-3 cycles but due to intolerability her treatment was then switched to Abraxane and Herceptin. She completed D. Abraxane and Herceptin combination on October 2014.  #2 she has been continued on adjuvant antiestrogen therapy with letrozole 2.5 mg starting 01/05/2013. She is also receiving adjuvant Herceptin every 3 weeks. We are monitoring her echocardiograms. Her next echocardiogram/[place] will be performed in April.  #3 new back pain/gait disturbances: Unclear etiology I do worry about possible recurrence we will do MRIs of the thoracic spine as well as the lumbar spine. I have discussed this with the patient.  #4 new headaches: Concerning for possible metastasis we will do MRI of the brain.  #5 aches and pain: Could be secondary to letrozole I will also hold her letrozole for the next 3 weeks or so to see if some of the issues resolve. If they do then we may have to switch drugs. She understands this. However she also understands that we need to do radiologic studies to make sure she doesn't have recurrence of disease.  #6 followup: Patient will be scheduled to see [place] back in 3 weeks' time however we will plan on seeing her sooner if need arises.  No orders of the defined types were placed in this encounter.   All questions were answered. The patient knows to call the clinic with any problems, questions or concerns. No barriers to learning was detected. I spent 25 minutes counseling the patient face to face. The total time spent in the appointment was 30 minutes and more than 50% was  on counseling and review of test results     Marcy Panning, MD 05/18/2013 8:36 AM

## 2013-05-18 NOTE — Telephone Encounter (Signed)
, °

## 2013-05-18 NOTE — Patient Instructions (Signed)
[facility] Discharge Instructions for Patients Receiving Chemotherapy  Today you received the following chemotherapy agents Herceptin.  To help prevent nausea and vomiting after your treatment, we encourage you to take your nausea medication as prescribed.   If you develop nausea and vomiting that is not controlled by your nausea medication, call the clinic.   BELOW ARE SYMPTOMS THAT SHOULD BE REPORTED IMMEDIATELY:  *FEVER GREATER THAN 100.5 F  *CHILLS WITH OR WITHOUT FEVER  NAUSEA AND VOMITING THAT IS NOT CONTROLLED WITH YOUR NAUSEA MEDICATION  *UNUSUAL SHORTNESS OF BREATH  *UNUSUAL BRUISING OR BLEEDING  TENDERNESS IN MOUTH AND THROAT WITH OR WITHOUT PRESENCE OF ULCERS  *URINARY PROBLEMS  *BOWEL PROBLEMS  UNUSUAL RASH Items with * indicate a potential emergency and should be followed up as soon as possible.  Feel free to call the clinic you have any questions or concerns. The clinic phone number is [phone].    

## 2013-05-19 ENCOUNTER — Encounter: Payer: Self-pay | Admitting: Oncology

## 2013-05-19 NOTE — Progress Notes (Signed)
[employer] approved mri brain @ [facility] 05/21/13 Josem Kaufmann [id number] and mri lumbar [id number] spine [id number] on 05/25/13.

## 2013-05-21 ENCOUNTER — Other Ambulatory Visit: Payer: BC Managed Care – PPO

## 2013-05-24 ENCOUNTER — Other Ambulatory Visit: Payer: BC Managed Care – PPO

## 2013-05-25 ENCOUNTER — Ambulatory Visit
Admission: RE | Admit: 2013-05-25 | Discharge: 2013-05-25 | Disposition: A | Discharge status: Home / Self Care | Payer: BC Managed Care – PPO | Source: Ambulatory Visit | Attending: Oncology | Admitting: Oncology

## 2013-05-25 DIAGNOSIS — C50911 Malignant neoplasm of unspecified site of right female breast: Secondary | ICD-10-CM

## 2013-05-25 MED ORDER — GADOBENATE DIMEGLUMINE 529 MG/ML IV SOLN
19.0000 mL | Freq: Once | INTRAVENOUS | Status: AC | PRN
  Administered 2013-05-25: 19 mL via INTRAVENOUS

## 2013-05-30 ENCOUNTER — Ambulatory Visit
Admission: RE | Admit: 2013-05-30 | Discharge: 2013-05-30 | Disposition: A | Discharge status: Home / Self Care | Payer: BC Managed Care – PPO | Source: Ambulatory Visit | Attending: Oncology | Admitting: Oncology

## 2013-05-30 DIAGNOSIS — C50911 Malignant neoplasm of unspecified site of right female breast: Secondary | ICD-10-CM

## 2013-05-30 MED ORDER — GADOBENATE DIMEGLUMINE 529 MG/ML IV SOLN
18.0000 mL | Freq: Once | INTRAVENOUS | Status: AC | PRN
  Administered 2013-05-30: 18 mL via INTRAVENOUS

## 2013-05-31 ENCOUNTER — Ambulatory Visit (INDEPENDENT_AMBULATORY_CARE_PROVIDER_SITE_OTHER): Payer: BC Managed Care – PPO | Admitting: General Surgery

## 2013-06-01 ENCOUNTER — Other Ambulatory Visit: Payer: Self-pay | Admitting: Oncology

## 2013-06-08 ENCOUNTER — Encounter: Payer: Self-pay | Admitting: Adult Health

## 2013-06-08 ENCOUNTER — Telehealth: Payer: Self-pay | Admitting: *Deleted

## 2013-06-08 ENCOUNTER — Other Ambulatory Visit (HOSPITAL_BASED_OUTPATIENT_CLINIC_OR_DEPARTMENT_OTHER): Payer: BC Managed Care – PPO

## 2013-06-08 ENCOUNTER — Ambulatory Visit (HOSPITAL_BASED_OUTPATIENT_CLINIC_OR_DEPARTMENT_OTHER): Payer: BC Managed Care – PPO | Admitting: Adult Health

## 2013-06-08 ENCOUNTER — Ambulatory Visit (HOSPITAL_BASED_OUTPATIENT_CLINIC_OR_DEPARTMENT_OTHER): Payer: BC Managed Care – PPO

## 2013-06-08 VITALS — BP 152/76 | HR 70 | Temp 98.1°F | Resp 18 | Ht 64.0 in | Wt 195.7 lb

## 2013-06-08 DIAGNOSIS — C50119 Malignant neoplasm of central portion of unspecified female breast: Secondary | ICD-10-CM

## 2013-06-08 DIAGNOSIS — C50919 Malignant neoplasm of unspecified site of unspecified female breast: Secondary | ICD-10-CM

## 2013-06-08 DIAGNOSIS — Z170 Estrogen receptor positive status [ER+]: Secondary | ICD-10-CM

## 2013-06-08 DIAGNOSIS — Z5112 Encounter for antineoplastic immunotherapy: Secondary | ICD-10-CM

## 2013-06-08 DIAGNOSIS — C50911 Malignant neoplasm of unspecified site of right female breast: Secondary | ICD-10-CM

## 2013-06-08 DIAGNOSIS — C50912 Malignant neoplasm of unspecified site of left female breast: Secondary | ICD-10-CM

## 2013-06-08 LAB — CBC WITH DIFFERENTIAL/PLATELET
BASO%: 0.5 % (ref 0.0–2.0)
Basophils Absolute: 0 10*3/uL (ref 0.0–0.1)
EOS%: 1.7 % (ref 0.0–7.0)
Eosinophils Absolute: 0.1 10*3/uL (ref 0.0–0.5)
HCT: 38.3 % (ref 34.8–46.6)
HGB: 12.4 g/dL (ref 11.6–15.9)
LYMPH%: 25 % (ref 14.0–49.7)
MCH: 27 pg (ref 25.1–34.0)
MCHC: 32.3 g/dL (ref 31.5–36.0)
MCV: 83.7 fL (ref 79.5–101.0)
MONO#: 0.5 10*3/uL (ref 0.1–0.9)
MONO%: 10.3 % (ref 0.0–14.0)
NEUT#: 3.1 10*3/uL (ref 1.5–6.5)
NEUT%: 62.5 % (ref 38.4–76.8)
Platelets: 258 10*3/uL (ref 145–400)
RBC: 4.58 10*6/uL (ref 3.70–5.45)
RDW: 14.5 % (ref 11.2–14.5)
WBC: 4.9 10*3/uL (ref 3.9–10.3)
lymph#: 1.2 10*3/uL (ref 0.9–3.3)

## 2013-06-08 LAB — COMPREHENSIVE METABOLIC PANEL (CC13)
ALT: 18 U/L (ref 0–55)
AST: 23 U/L (ref 5–34)
Albumin: 3.6 g/dL (ref 3.5–5.0)
Alkaline Phosphatase: 95 U/L (ref 40–150)
Anion Gap: 10 mEq/L (ref 3–11)
BUN: 10.8 mg/dL (ref 7.0–26.0)
CO2: 27 mEq/L (ref 22–29)
Calcium: 10 mg/dL (ref 8.4–10.4)
Chloride: 106 mEq/L (ref 98–109)
Creatinine: 0.7 mg/dL (ref 0.6–1.1)
Glucose: 173 mg/dl — ABNORMAL HIGH (ref 70–140)
Potassium: 4.4 mEq/L (ref 3.5–5.1)
Sodium: 144 mEq/L (ref 136–145)
Total Bilirubin: 0.38 mg/dL (ref 0.20–1.20)
Total Protein: 6.6 g/dL (ref 6.4–8.3)

## 2013-06-08 MED ORDER — DIPHENHYDRAMINE HCL 25 MG PO CAPS
50.0000 mg | ORAL_CAPSULE | Freq: Once | ORAL | Status: AC
  Administered 2013-06-08: 50 mg via ORAL

## 2013-06-08 MED ORDER — SODIUM CHLORIDE 0.9 % IJ SOLN
10.0000 mL | INTRAMUSCULAR | Status: DC | PRN
  Administered 2013-06-08: 10 mL
  Filled 2013-06-08: qty 10

## 2013-06-08 MED ORDER — ACETAMINOPHEN 325 MG PO TABS
ORAL_TABLET | ORAL | Status: AC
  Filled 2013-06-08: qty 2

## 2013-06-08 MED ORDER — SODIUM CHLORIDE 0.9 % IV SOLN
Freq: Once | INTRAVENOUS | Status: AC
  Administered 2013-06-08: 10:00:00 via INTRAVENOUS

## 2013-06-08 MED ORDER — DIPHENHYDRAMINE HCL 25 MG PO CAPS
ORAL_CAPSULE | ORAL | Status: AC
  Filled 2013-06-08: qty 2

## 2013-06-08 MED ORDER — ACETAMINOPHEN 325 MG PO TABS
650.0000 mg | ORAL_TABLET | Freq: Once | ORAL | Status: AC
  Administered 2013-06-08: 650 mg via ORAL

## 2013-06-08 MED ORDER — HEPARIN SOD (PORK) LOCK FLUSH 100 UNIT/ML IV SOLN
500.0000 [IU] | Freq: Once | INTRAVENOUS | Status: AC | PRN
Start: 2013-06-08 — End: 2013-06-08
  Administered 2013-06-08: 500 [IU]
  Filled 2013-06-08: qty 5

## 2013-06-08 MED ORDER — TRASTUZUMAB CHEMO INJECTION 440 MG
6.0000 mg/kg | Freq: Once | INTRAVENOUS | Status: AC
  Administered 2013-06-08: 525 mg via INTRAVENOUS
  Filled 2013-06-08: qty 25

## 2013-06-08 NOTE — Progress Notes (Signed)
Hematology and Oncology Follow Up Visit  [name] [name] [id number] [birth year] 63 y.o. 06/09/2013 3:45 PM     Principle Diagnosis:[name] [name] 63 y.o. female with bilateral breast cancer.     Prior Therapy:  #1screening mammogram performed that showed 2 areas of concern on the left side.calcifications were noted in the left. In the right breast a possible mass warranted further evaluation.   #2 On 03/29/2012 patient underwent a bilateral diagnostic mammogram and right breast ultrasound. A spot magnification images demonstrate suspicious group of pleomorphic microcalcifications over the outer lower left breast with additional suspicious group of pleomorphic microcalcifications over the outer midportion of the left periareolar region. Spot compression images of the right breast demonstrate persistence of a 1 cm density at the edge of the film in the deep third of the right inner breast.   #3 Ultrasound performed showed no focal abnormality over the entire in her right breast to correspond to the mammographic density. Patient was recommended stereotactic core needle biopsy of the 2 groups of suspicious left breast microcalcifications. Because patient and her husband wanted bilateral mastectomies MRI was not performed.on 04/15/2012 patient had needle core biopsy performed of the 2 areas in the left breast. The left needle core biopsy in the lower breast revealed ductal carcinoma in situ with associated comedo necrosis and calcifications with the in situ carcinoma. It was ER +100% PR +12%. 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 Tumor was ER +100% PR +11% proliferation marker Ki-67 elevated at 80% HER-2/neu showed amplification with a ratio of 6.50.   #4 patient is now status post bilateral mastectomies performed on 05/14/2012 with immediate reconstruction. The final pathology revealed bilateral breast cancers. In the right  breast tissue is noted to have a 1.2 cm invasive ductal carcinoma high-grade ER negative PR negative HER-2/neu negative with Ki-67 92% one node was negative for metastatic disease but this was not the sentinel lymph node. On the left side the patient had multifocal disease measuring 1.4 cm, 1.2 cm, 0.1 cm. Tumor was ER +100% PR +11% HER-2/neu positive with a ratio 6.50. Ki-67 was 80% 8 sentinel nodes were negative for metastatic disease.   #5 patient has had immediate reconstruction with expanders bilaterally.   #6s/p adjuvant chemotherapy initially consisting of Adriamycin Cytoxan every 2 weeks for a total of 4 cycles. Given 07/02/12 - 08/12/12   #7This will then be followed by Taxol and Herceptin to be given weekly for 12 weeks time.beginning 08/27/12. This was discontinued due to neuropathy.   #8 Abraxane and Herceptin days 1, day 8, day 15 on a 28 day cycle. Beginning 09/03/2012 - 11/2012   #9 curative intent adjuvant Herceptin every 3 weeks beginning 01/05/2013   #10 curative intent adjuvant letrozole 2.5 mg daily starting 01/05/2013. Total of 5 years of therapy is planned    Current therapy: Herceptin every three weeks, Letrozole currently on hold.    Interim History: [name] [name] 63 y.o. female with bilateral breast cancer who is here today for evaluation prior to Herceptin therapy.  She was recently on Letrozole that was started on 01/05/13.  She had developed increasing stiffness in her joints and was instructed to stop her Letrozole 3 weeks ago.  The stiffness is much improved, however it is not completely gone.  Otherwise, she denies chest pain, palpitations, DOE, lower extremity swelling, new pain, or any further concerns.    Medications:  Current Outpatient Prescriptions  Medication Sig  Dispense Refill  . Ascorbic Acid (VITAMIN C) 500 MG tablet Take 500 mg by mouth daily.        . Biotin 10 MG TABS Take by mouth. 4000 mcg daily      . calcitonin, salmon, (MIACALCIN/FORTICAL)  200 UNIT/ACT nasal spray Place 1 spray into the nose daily.      . Calcium Citrate (CITRACAL PO) Take by mouth.      . Cholecalciferol (VITAMIN D PO) Take 1 tablet by mouth daily.      . fluticasone (FLONASE) 50 MCG/ACT nasal spray Place 2 sprays into the nose daily.  16 g  2  . gabapentin (NEURONTIN) 100 MG capsule Take 1 capsule at bedtime  30 capsule  4  . KRILL OIL PO Take by mouth.      . letrozole (FEMARA) 2.5 MG tablet Take 1 tablet (2.5 mg total) by mouth daily.  90 tablet  12  . lidocaine-prilocaine (EMLA) cream Apply topically as needed.  30 g  6  . Multiple Vitamin (MULTIVITAMIN PO) Take 1 tablet by mouth daily.       . pantoprazole (PROTONIX) 40 MG tablet Take 40 mg by mouth every other day.      . Probiotic Product (PROBIOTIC DAILY PO) Take by mouth daily.      . rosuvastatin (CRESTOR) 5 MG tablet Take 5 mg by mouth every Monday, Wednesday, and Friday.      . sitaGLIPtin (JANUVIA) 100 MG tablet Take 100 mg by mouth daily.      Marland Kitchen UNABLE TO FIND Apply 1 Device topically once. Cranial prosthesis r/t chemotherapy treatment  1 Device  0   No current facility-administered medications for this visit.     Allergies:  Allergies  Allergen Reactions  . Darvon Hives and Itching    All over body  . Penicillins Hives and Itching    All over body    Medical History: Past Medical History  Diagnosis Date  . Heart murmur   . Diabetes mellitus without complication   . Osteopenia   . Cancer     breast  . Bronchitis   . Pneumonia   . Hiatal hernia   . Sinus problem   . Diabetes mellitus   . Breast cancer 04/15/12    left-biopsy  . Breast cancer, right breast 05/14/12    right mastectomy  . CHF with unknown LVEF 03/09/2013    Surgical History:  Past Surgical History  Procedure Laterality Date  . Cesarean section  1981, 1983  . Tummy tuck  1999  . Tonsillectomy    . Foot surgery    . Gastric bypass    . Wound debridement  01/14/2011    Procedure: DEBRIDEMENT ABDOMINAL  WOUND;  Surgeon: Adin Hector, MD;  Location: [facility];  Service: General;  Laterality: N/A;  wound debridement and debridement on the abdomen  . Cosmetic surgery    . Total mastectomy Bilateral 05/14/2012    Procedure: TOTAL MASTECTOMY;  Surgeon: Adin Hector, MD;  Location: [facility];  Service: General;  Laterality: Bilateral;  . Axillary sentinel node biopsy Left 05/14/2012    Procedure: AXILLARY SENTINEL NODE BIOPSY;  Surgeon: Adin Hector, MD;  Location: [facility];  Service: General;  Laterality: Left;  . Portacath placement N/A 05/14/2012    Procedure: INSERTION PORT-A-CATH;  Surgeon: Adin Hector, MD;  Location: [facility];  Service: General;  Laterality: N/A;  . Breast reconstruction with placement of tissue expander and flex hd (  acellular hydrated dermis) Bilateral 05/14/2012    Procedure: BREAST RECONSTRUCTION WITH PLACEMENT OF TISSUE EXPANDER AND FLEX HD (ACELLULAR HYDRATED DERMIS);  Surgeon: Crissie Reese, MD;  Location: [facility];  Service: Plastics;  Laterality: Bilateral;  . Bunionectomy       Review of Systems: A 10 point review of systems was conducted and is otherwise negative except for what is noted above.     Physical Exam: Blood pressure 152/76, pulse 70, temperature 98.1 F (36.7 C), temperature source Oral, resp. rate 18, height '5\' 4"'  (1.626 m), weight 195 lb 11.2 oz (88.769 kg). GENERAL: Patient is a well appearing female in no acute distress HEENT:  Sclerae anicteric.  Oropharynx clear and moist. No ulcerations or evidence of oropharyngeal candidiasis. Neck is supple.  NODES:  No cervical, supraclavicular, or axillary lymphadenopathy palpated.  BREAST EXAM:  Deferred. LUNGS:  Clear to auscultation bilaterally.  No wheezes or rhonchi. HEART:  Regular rate and rhythm. No murmur appreciated. ABDOMEN:  Soft, nontender.  Positive, normoactive bowel sounds. No organomegaly palpated. MSK:  No focal spinal tenderness to palpation. Full range of motion  bilaterally in the upper extremities. EXTREMITIES:  No peripheral edema.   SKIN:  Clear with no obvious rashes or skin changes. No nail dyscrasia. NEURO:  Nonfocal. Well oriented.  Appropriate affect. ECOG PERFORMANCE STATUS: 1 - Symptomatic but completely ambulatory   Lab Results: Lab Results  Component Value Date   WBC 4.9 06/08/2013   HGB 12.4 06/08/2013   HCT 38.3 06/08/2013   MCV 83.7 06/08/2013   PLT 258 06/08/2013     Chemistry      Component Value Date/Time   NA 144 06/08/2013 0806   NA 139 05/17/2012 0530   K 4.4 06/08/2013 0806   K 3.7 05/17/2012 0530   CL 104 07/30/2012 0955   CL 100 05/17/2012 0530   CO2 27 06/08/2013 0806   CO2 33* 05/17/2012 0530   BUN 10.8 06/08/2013 0806   BUN 4* 05/17/2012 0530   CREATININE 0.7 06/08/2013 0806   CREATININE 0.56 05/17/2012 0530      Component Value Date/Time   CALCIUM 10.0 06/08/2013 0806   CALCIUM 8.7 05/17/2012 0530   ALKPHOS 95 06/08/2013 0806   ALKPHOS 103 05/10/2012 1440   AST 23 06/08/2013 0806   AST 25 05/10/2012 1440   ALT 18 06/08/2013 0806   ALT 17 05/10/2012 1440   BILITOT 0.38 06/08/2013 0806   BILITOT 0.4 05/10/2012 1440     Assessment and Plan: [name] G Dhawan 63 y.o. female with  1. Bilateral breast cancer.  The patient is doing well today.  She is currently receiving adjuvant every three week herceptin and tolerating it well.  She will proceed with this today.  I reviewed her labs with her in detail and they are stable.  We discussed her not taking Letrozole, and how much her joint pain has improved.  Due to this, we will cotninue to hold Letrozole.  I discussed the possiblity of starting Arimidex daily with the patient.  She is in agreement.  I gave the patient information today and we will f/u regarding this in 3 weeks for a final decision.  2. Cardiac:  The patient has no symptoms of DOE, orthopnea, chest pain, palpitations or any further concerns.  She did undergo an echo on 03/01/13 that demonstrated a LVEF of 50-55%.   This was further evaluated with a MUGA that demonstrated an EF of 66%.  Her next Muga is on 06/09/13.  The  patient will return in 3 weeks for labs and evaluation prior to receiving Herceptin therapy.   She knows to call us in the interim for any questions or concerns.  We can certainly see her sooner if needed.  I spent 25 minutes counseling the patient face to face.  The total time spent in the appointment was 30 minutes.  Minette Headland, [place] [phone] 06/09/2013 3:45 PM

## 2013-06-08 NOTE — Telephone Encounter (Signed)
Per POF I have sccheduled appts

## 2013-06-08 NOTE — Patient Instructions (Signed)
Continue to not take Letrozole.  We will re-evaluate your stiffness in 3 weeks and then discuss starting Arimidex.     Anastrozole tablets What is this medicine? ANASTROZOLE (an AS troe zole) is used to treat breast cancer in women who have gone through menopause. Some types of breast cancer depend on estrogen to grow, and this medicine can stop tumor growth by blocking estrogen production. This medicine may be used for other purposes; ask your health care provider or pharmacist if you have questions. COMMON BRAND NAME(S): Arimidex What should I tell my health care provider before I take this medicine? They need to know if you have any of these conditions: -liver disease -an unusual or allergic reaction to anastrozole, other medicines, foods, dyes, or preservatives -pregnant or trying to get pregnant -breast-feeding How should I use this medicine? Take this medicine by mouth with a glass of water. Follow the directions on the prescription label. You can take this medicine with or without food. Take your doses at regular intervals. Do not take your medicine more often than directed. Do not stop taking except on the advice of your doctor or health care professional. Talk to your pediatrician regarding the use of this medicine in children. Special care may be needed. Overdosage: If you think you have taken too much of this medicine contact a poison control center or emergency room at once. NOTE: This medicine is only for you. Do not share this medicine with others. What if I miss a dose? If you miss a dose, take it as soon as you can. If it is almost time for your next dose, take only that dose. Do not take double or extra doses. What may interact with this medicine? Do not take this medicine with any of the following medications: -female hormones, like estrogens or progestins and birth control pills This medicine may also interact with the following medications: -tamoxifen This list may not  describe all possible interactions. Give your health care provider a list of all the medicines, herbs, non-prescription drugs, or dietary supplements you use. Also tell them if you smoke, drink alcohol, or use illegal drugs. Some items may interact with your medicine. What should I watch for while using this medicine? Visit your doctor or health care professional for regular checks on your progress. Let your doctor or health care professional know about any unusual vaginal bleeding. Do not treat yourself for diarrhea, nausea, vomiting or other side effects. Ask your doctor or health care professional for advice. What side effects may I notice from receiving this medicine? Side effects that you should report to your doctor or health care professional as soon as possible: -allergic reactions like skin rash, itching or hives, swelling of the face, lips, or tongue -any new or unusual symptoms -breathing problems -chest pain -leg pain or swelling -vomiting Side effects that usually do not require medical attention (report to your doctor or health care professional if they continue or are bothersome): -back or bone pain -cough, or throat infection -diarrhea or constipation -dizziness -headache -hot flashes -loss of appetite -nausea -sweating -weakness and tiredness -weight gain This list may not describe all possible side effects. Call your doctor for medical advice about side effects. You may report side effects to FDA at 1-800-FDA-1088. Where should I keep my medicine? Keep out of the reach of children. Store at room temperature between 20 and 25 degrees C (68 and 77 degrees F). Throw away any unused medicine after the expiration date. NOTE: This  sheet is a summary. It may not cover all possible information. If you have questions about this medicine, talk to your doctor, pharmacist, or health care provider.  2014, Elsevier/Gold Standard. (2007-04-16 16:31:52)

## 2013-06-08 NOTE — Patient Instructions (Signed)
[facility] Discharge Instructions for Patients Receiving Chemotherapy  Today you received the following chemotherapy agents: Herpcetin  To help prevent nausea and vomiting after your treatment, we encourage you to take your nausea medication as prescribed.    If you develop nausea and vomiting that is not controlled by your nausea medication, call the clinic.   BELOW ARE SYMPTOMS THAT SHOULD BE REPORTED IMMEDIATELY:  *FEVER GREATER THAN 100.5 F  *CHILLS WITH OR WITHOUT FEVER  NAUSEA AND VOMITING THAT IS NOT CONTROLLED WITH YOUR NAUSEA MEDICATION  *UNUSUAL SHORTNESS OF BREATH  *UNUSUAL BRUISING OR BLEEDING  TENDERNESS IN MOUTH AND THROAT WITH OR WITHOUT PRESENCE OF ULCERS  *URINARY PROBLEMS  *BOWEL PROBLEMS  UNUSUAL RASH Items with * indicate a potential emergency and should be followed up as soon as possible.  Feel free to call the clinic you have any questions or concerns. The clinic phone number is [phone].    

## 2013-06-09 ENCOUNTER — Ambulatory Visit (HOSPITAL_COMMUNITY)
Admission: RE | Admit: 2013-06-09 | Discharge: 2013-06-09 | Disposition: A | Discharge status: Home / Self Care | Payer: BC Managed Care – PPO | Source: Ambulatory Visit | Attending: Oncology | Admitting: Oncology

## 2013-06-09 DIAGNOSIS — C50911 Malignant neoplasm of unspecified site of right female breast: Secondary | ICD-10-CM

## 2013-06-09 DIAGNOSIS — Z79899 Other long term (current) drug therapy: Secondary | ICD-10-CM | POA: Insufficient documentation

## 2013-06-09 DIAGNOSIS — Z09 Encounter for follow-up examination after completed treatment for conditions other than malignant neoplasm: Secondary | ICD-10-CM | POA: Insufficient documentation

## 2013-06-09 DIAGNOSIS — C50919 Malignant neoplasm of unspecified site of unspecified female breast: Secondary | ICD-10-CM | POA: Insufficient documentation

## 2013-06-09 MED ORDER — TECHNETIUM TC 99M-LABELED RED BLOOD CELLS IV KIT
23.0000 | PACK | Freq: Once | INTRAVENOUS | Status: AC | PRN
  Administered 2013-06-09: 23 via INTRAVENOUS

## 2013-06-14 ENCOUNTER — Ambulatory Visit (HOSPITAL_COMMUNITY): Payer: BC Managed Care – PPO

## 2013-06-29 ENCOUNTER — Ambulatory Visit (HOSPITAL_BASED_OUTPATIENT_CLINIC_OR_DEPARTMENT_OTHER): Payer: BC Managed Care – PPO | Admitting: Adult Health

## 2013-06-29 ENCOUNTER — Telehealth: Payer: Self-pay | Admitting: Adult Health

## 2013-06-29 ENCOUNTER — Ambulatory Visit (HOSPITAL_BASED_OUTPATIENT_CLINIC_OR_DEPARTMENT_OTHER): Payer: BC Managed Care – PPO

## 2013-06-29 ENCOUNTER — Other Ambulatory Visit: Payer: BC Managed Care – PPO

## 2013-06-29 ENCOUNTER — Other Ambulatory Visit (HOSPITAL_BASED_OUTPATIENT_CLINIC_OR_DEPARTMENT_OTHER): Payer: BC Managed Care – PPO

## 2013-06-29 ENCOUNTER — Encounter: Payer: Self-pay | Admitting: Adult Health

## 2013-06-29 ENCOUNTER — Ambulatory Visit: Payer: BC Managed Care – PPO | Admitting: Oncology

## 2013-06-29 VITALS — BP 146/82 | HR 83 | Temp 98.4°F | Resp 18 | Ht 64.0 in | Wt 196.0 lb

## 2013-06-29 DIAGNOSIS — C50912 Malignant neoplasm of unspecified site of left female breast: Secondary | ICD-10-CM

## 2013-06-29 DIAGNOSIS — C50919 Malignant neoplasm of unspecified site of unspecified female breast: Secondary | ICD-10-CM

## 2013-06-29 DIAGNOSIS — C50119 Malignant neoplasm of central portion of unspecified female breast: Secondary | ICD-10-CM

## 2013-06-29 DIAGNOSIS — C50319 Malignant neoplasm of lower-inner quadrant of unspecified female breast: Secondary | ICD-10-CM

## 2013-06-29 DIAGNOSIS — C50911 Malignant neoplasm of unspecified site of right female breast: Secondary | ICD-10-CM

## 2013-06-29 DIAGNOSIS — Z79811 Long term (current) use of aromatase inhibitors: Secondary | ICD-10-CM

## 2013-06-29 DIAGNOSIS — Z5112 Encounter for antineoplastic immunotherapy: Secondary | ICD-10-CM

## 2013-06-29 DIAGNOSIS — C50519 Malignant neoplasm of lower-outer quadrant of unspecified female breast: Secondary | ICD-10-CM

## 2013-06-29 LAB — CBC WITH DIFFERENTIAL/PLATELET
BASO%: 0.7 % (ref 0.0–2.0)
Basophils Absolute: 0 10*3/uL (ref 0.0–0.1)
EOS%: 2.6 % (ref 0.0–7.0)
Eosinophils Absolute: 0.1 10*3/uL (ref 0.0–0.5)
HCT: 38.5 % (ref 34.8–46.6)
HGB: 12.5 g/dL (ref 11.6–15.9)
LYMPH%: 24.1 % (ref 14.0–49.7)
MCH: 27.4 pg (ref 25.1–34.0)
MCHC: 32.6 g/dL (ref 31.5–36.0)
MCV: 84.2 fL (ref 79.5–101.0)
MONO#: 0.4 10*3/uL (ref 0.1–0.9)
MONO%: 10.5 % (ref 0.0–14.0)
NEUT#: 2.6 10*3/uL (ref 1.5–6.5)
NEUT%: 62.1 % (ref 38.4–76.8)
Platelets: 245 10*3/uL (ref 145–400)
RBC: 4.57 10*6/uL (ref 3.70–5.45)
RDW: 15.1 % — ABNORMAL HIGH (ref 11.2–14.5)
WBC: 4.3 10*3/uL (ref 3.9–10.3)
lymph#: 1 10*3/uL (ref 0.9–3.3)

## 2013-06-29 LAB — COMPREHENSIVE METABOLIC PANEL (CC13)
ALT: 18 U/L (ref 0–55)
AST: 21 U/L (ref 5–34)
Albumin: 3.6 g/dL (ref 3.5–5.0)
Alkaline Phosphatase: 94 U/L (ref 40–150)
Anion Gap: 11 mEq/L (ref 3–11)
BUN: 9.2 mg/dL (ref 7.0–26.0)
CO2: 26 mEq/L (ref 22–29)
Calcium: 10.1 mg/dL (ref 8.4–10.4)
Chloride: 106 mEq/L (ref 98–109)
Creatinine: 0.7 mg/dL (ref 0.6–1.1)
Glucose: 152 mg/dl — ABNORMAL HIGH (ref 70–140)
Potassium: 5 mEq/L (ref 3.5–5.1)
Sodium: 143 mEq/L (ref 136–145)
Total Bilirubin: 0.38 mg/dL (ref 0.20–1.20)
Total Protein: 6.6 g/dL (ref 6.4–8.3)

## 2013-06-29 MED ORDER — ACETAMINOPHEN 325 MG PO TABS
ORAL_TABLET | ORAL | Status: AC
  Filled 2013-06-29: qty 2

## 2013-06-29 MED ORDER — DIPHENHYDRAMINE HCL 25 MG PO CAPS
50.0000 mg | ORAL_CAPSULE | Freq: Once | ORAL | Status: AC
  Administered 2013-06-29: 50 mg via ORAL

## 2013-06-29 MED ORDER — TRASTUZUMAB CHEMO INJECTION 440 MG
6.0000 mg/kg | Freq: Once | INTRAVENOUS | Status: AC
  Administered 2013-06-29: 525 mg via INTRAVENOUS
  Filled 2013-06-29: qty 25

## 2013-06-29 MED ORDER — SODIUM CHLORIDE 0.9 % IV SOLN
Freq: Once | INTRAVENOUS | Status: AC
  Administered 2013-06-29: 11:00:00 via INTRAVENOUS

## 2013-06-29 MED ORDER — ACETAMINOPHEN 325 MG PO TABS
650.0000 mg | ORAL_TABLET | Freq: Once | ORAL | Status: AC
Start: 2013-06-29 — End: 2013-06-29
  Administered 2013-06-29: 650 mg via ORAL

## 2013-06-29 MED ORDER — SODIUM CHLORIDE 0.9 % IJ SOLN
10.0000 mL | INTRAMUSCULAR | Status: DC | PRN
  Administered 2013-06-29: 10 mL
  Filled 2013-06-29: qty 10

## 2013-06-29 MED ORDER — DIPHENHYDRAMINE HCL 25 MG PO CAPS
ORAL_CAPSULE | ORAL | Status: AC
  Filled 2013-06-29: qty 2

## 2013-06-29 MED ORDER — HEPARIN SOD (PORK) LOCK FLUSH 100 UNIT/ML IV SOLN
500.0000 [IU] | Freq: Once | INTRAVENOUS | Status: AC | PRN
  Administered 2013-06-29: 500 [IU]
  Filled 2013-06-29: qty 5

## 2013-06-29 NOTE — Telephone Encounter (Signed)
per pof to move 6/24 appt to 7/1-per [facility] verbally to move trmts 3 weeks out(2trmts left) to coincide with 3 wks trmts from 7/1-gave pt copy of sch

## 2013-06-29 NOTE — Patient Instructions (Signed)
[name] Discharge Instructions for Patients Receiving Chemotherapy  Today you received the following chemotherapy agents Herceptin.  To help prevent nausea and vomiting after your treatment, we encourage you to take your nausea medication (no anti-emetics ordered)  If needed call MD office.  If you develop nausea and vomiting that is not controlled by your nausea medication, call the clinic.   BELOW ARE SYMPTOMS THAT SHOULD BE REPORTED IMMEDIATELY:  *FEVER GREATER THAN 100.5 F  *CHILLS WITH OR WITHOUT FEVER  NAUSEA AND VOMITING THAT IS NOT CONTROLLED WITH YOUR NAUSEA MEDICATION  *UNUSUAL SHORTNESS OF BREATH  *UNUSUAL BRUISING OR BLEEDING  TENDERNESS IN MOUTH AND THROAT WITH OR WITHOUT PRESENCE OF ULCERS  *URINARY PROBLEMS  *BOWEL PROBLEMS  UNUSUAL RASH Items with * indicate a potential emergency and should be followed up as soon as possible.  Feel free to call the clinic you have any questions or concerns. The clinic phone number is [phone].

## 2013-06-29 NOTE — Progress Notes (Signed)
ID: [place] G Maione OB: 18-Jan-1951  MR#: [id number]  TRR#:[id number]  PCP: [name], MD GYN:  Dr Normand Sloop SU: Dr. Claud Kelp OTHER MD: Dr. Odis Luster, plastic surgery  CHIEF COMPLAINT: 63 year old [place], [place] woman with right breast T1 N0, stage IA, invasive ductal carcinoma, grade III, ER negative, PR negative, Ki-67 of 92%, HER-2/neu negative and left breast T1 N0, stage IA invasive ductal carcinoma, grade II, ER 100%, PR 11%, Ki-67 80%, HER-2/neu positive.    BREAST CANCER HISTORY:  #1screening mammogram performed that showed 2 areas of concern on the left side.calcifications were noted in the left. In the right breast a possible mass warranted further evaluation.   #2 On 03/29/2012 patient underwent a bilateral diagnostic mammogram and right breast ultrasound. A spot magnification images demonstrate suspicious group of pleomorphic microcalcifications over the outer lower left breast with additional suspicious group of pleomorphic microcalcifications over the outer midportion of the left periareolar region. Spot compression images of the right breast demonstrate persistence of a 1 cm density at the edge of the film in the deep third of the right inner breast.   #3 Ultrasound performed showed no focal abnormality over the entire in her right breast to correspond to the mammographic density. Patient was recommended stereotactic core needle biopsy of the 2 groups of suspicious left breast microcalcifications. Because patient and her husband wanted bilateral mastectomies MRI was not performed.on 04/15/2012 patient had needle core biopsy performed of the 2 areas in the left breast. The left needle core biopsy in the lower breast revealed ductal carcinoma in situ with associated comedo necrosis and calcifications with the in situ carcinoma. It was ER +100% PR +12%. 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 Tumor was ER +100% PR  +11% proliferation marker Ki-67 elevated at 80% HER-2/neu showed amplification with a ratio of 6.50.   CURRENT THERAPY: adjuvant Herceptin every three weeks.  Letrozole daily on hold.    INTERVAL HISTORY:  Patient is here for evaluation prior to receiving herceptin today.  She is doing well.  She denies fevers, chills, nausea, vomiting, constipation, diarrhea, orthopnea, PND, lower extremity swelling.  She does have a very occasional mild winded feeling.  Her weight is stable.  Otherwise, a 10 point ROS is neg.   REVIEW OF SYSTEMS: A 10 point review of systems was conducted and is otherwise negative except for what is noted above.     PAST MEDICAL HISTORY: Past Medical History  Diagnosis Date  . Heart murmur   . Diabetes mellitus without complication   . Osteopenia   . Cancer     breast  . Bronchitis   . Pneumonia   . Hiatal hernia   . Sinus problem   . Diabetes mellitus   . Breast cancer 04/15/12    left-biopsy  . Breast cancer, right breast 05/14/12    right mastectomy  . CHF with unknown LVEF 03/09/2013    PAST SURGICAL HISTORY: Past Surgical History  Procedure Laterality Date  . Cesarean section  1981, 1983  . Tummy tuck  1999  . Tonsillectomy    . Foot surgery    . Gastric bypass    . Wound debridement  01/14/2011    Procedure: DEBRIDEMENT ABDOMINAL WOUND;  Surgeon: Ernestene Mention, MD;  Location: [facility];  Service: General;  Laterality: N/A;  wound debridement and debridement on the abdomen  . Cosmetic surgery    . Total mastectomy  Bilateral 05/14/2012    Procedure: TOTAL MASTECTOMY;  Surgeon: Adin Hector, MD;  Location: [facility];  Service: General;  Laterality: Bilateral;  . Axillary sentinel node biopsy Left 05/14/2012    Procedure: AXILLARY SENTINEL NODE BIOPSY;  Surgeon: Adin Hector, MD;  Location: [facility];  Service: General;  Laterality: Left;  . Portacath placement N/A 05/14/2012    Procedure: INSERTION PORT-A-CATH;  Surgeon: Adin Hector, MD;  Location: [facility];  Service: General;  Laterality: N/A;  . Breast reconstruction with placement of tissue expander and flex hd (acellular hydrated dermis) Bilateral 05/14/2012    Procedure: BREAST RECONSTRUCTION WITH PLACEMENT OF TISSUE EXPANDER AND FLEX HD (ACELLULAR HYDRATED DERMIS);  Surgeon: Crissie Reese, MD;  Location: [facility];  Service: Plastics;  Laterality: Bilateral;  . Bunionectomy      FAMILY HISTORY Family History  Problem Relation Age of Onset  . Heart attack Father   . Diabetes Father   . Heart attack Brother   . Diabetes Brother   . Diabetes Sister   . Breast cancer Sister 53  . Diabetes Mother   . Breast cancer Mother 54  . Parkinson's disease Sister   . Heart attack Brother     GYNECOLOGIC HISTORY: menarche at age 41, G56 P2, LMP 2002, no h/o abnormal pap smears or sexually transmitted infections  SOCIAL HISTORY: Lives with husband of 13 years, Governor Rooks. [place], in a house in [place], [place].  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     ADVANCED DIRECTIVES:  In place.  Copies in medical records.     HEALTH MAINTENANCE: History  Substance Use Topics  . Smoking status: Former Research scientist (life sciences)  . Smokeless tobacco: Never Used  . Alcohol Use: 1.2 - 1.8 oz/week    2-3 Glasses of wine per week      Mammogram: s/p bilateral mastectomy Colonoscopy: 2014, 5 year f/u recommended Bone Density Scan: 03/01/2013 Pap Smear: 2014 Eye Exam: 2014 Vitamin D Level:  By Dr. Baird Cancer  Lipid Panel: by Dr. Baird Cancer   Allergies  Allergen Reactions  . Darvon Hives and Itching    All over body  . Penicillins Hives and Itching    All over body    Current Outpatient Prescriptions  Medication Sig Dispense Refill  . Ascorbic Acid (VITAMIN C) 500 MG tablet Take 500 mg by mouth daily.        . Biotin 10 MG TABS Take by mouth. 4000 mcg daily      . Calcium Citrate (CITRACAL PO) Take by mouth.      . Cholecalciferol (VITAMIN D PO) Take 1 tablet by mouth daily.       Mariane Baumgarten Sodium (COLACE PO) Take by mouth.      Marland Kitchen KRILL OIL PO Take by mouth.      . letrozole (FEMARA) 2.5 MG tablet Take 1 tablet (2.5 mg total) by mouth daily.  90 tablet  12  . lidocaine-prilocaine (EMLA) cream Apply topically as needed.  30 g  6  . Multiple Vitamin (MULTIVITAMIN PO) Take 1 tablet by mouth daily.       . Probiotic Product (PROBIOTIC DAILY PO) Take by mouth daily.      . rosuvastatin (CRESTOR) 5 MG tablet Take 5 mg by mouth every Monday, Wednesday, and Friday.      . sitaGLIPtin (JANUVIA) 100 MG tablet Take 100 mg by mouth daily.      Marland Kitchen UNABLE TO FIND Apply 1 Device topically once. Cranial  prosthesis r/t chemotherapy treatment  1 Device  0   No current facility-administered medications for this visit.    OBJECTIVE: Filed Vitals:   06/29/13 0854  BP: 146/82  Pulse: 83  Temp: 98.4 F (36.9 C)  Resp: 18     Body mass index is 33.63 kg/(m^2).     GENERAL: Patient is a well appearing female in no acute distress HEENT:  Sclerae anicteric.  Oropharynx clear and moist. No ulcerations or evidence of oropharyngeal candidiasis. Neck is supple.  NODES:  No cervical, supraclavicular, or axillary lymphadenopathy palpated.  BREAST EXAM:  Deferred. LUNGS:  Clear to auscultation bilaterally.  No wheezes or rhonchi. HEART:  Regular rate and rhythm. No murmur appreciated. ABDOMEN:  Soft, nontender.  Positive, normoactive bowel sounds. No organomegaly palpated. MSK:  No focal spinal tenderness to palpation. Full range of motion bilaterally in the upper extremities. EXTREMITIES:  No peripheral edema.   SKIN:  Clear with no obvious rashes or skin changes. No nail dyscrasia. NEURO:  Nonfocal. Well oriented.  Appropriate affect. ECOG FS:1 - Symptomatic but completely ambulatory  LAB RESULTS:  CMP     Component Value Date/Time   NA 143 06/29/2013 0814   NA 139 05/17/2012 0530   K 5.0 06/29/2013 0814   K 3.7 05/17/2012 0530   CL 104 07/30/2012 0955   CL 100 05/17/2012 0530    CO2 26 06/29/2013 0814   CO2 33* 05/17/2012 0530   GLUCOSE 152* 06/29/2013 0814   GLUCOSE 138* 07/30/2012 0955   GLUCOSE 132* 05/17/2012 0530   BUN 9.2 06/29/2013 0814   BUN 4* 05/17/2012 0530   CREATININE 0.7 06/29/2013 0814   CREATININE 0.56 05/17/2012 0530   CALCIUM 10.1 06/29/2013 0814   CALCIUM 8.7 05/17/2012 0530   PROT 6.6 06/29/2013 0814   PROT 7.4 05/10/2012 1440   ALBUMIN 3.6 06/29/2013 0814   ALBUMIN 4.1 05/10/2012 1440   AST 21 06/29/2013 0814   AST 25 05/10/2012 1440   ALT 18 06/29/2013 0814   ALT 17 05/10/2012 1440   ALKPHOS 94 06/29/2013 0814   ALKPHOS 103 05/10/2012 1440   BILITOT 0.38 06/29/2013 0814   BILITOT 0.4 05/10/2012 1440   GFRNONAA >90 05/17/2012 0530   GFRAA >90 05/17/2012 0530    I No results found for this basename: SPEP,  UPEP,   kappa and lambda light chains    Lab Results  Component Value Date   WBC 4.3 06/29/2013   NEUTROABS 2.6 06/29/2013   HGB 12.5 06/29/2013   HCT 38.5 06/29/2013   MCV 84.2 06/29/2013   PLT 245 06/29/2013      Chemistry      Component Value Date/Time   NA 143 06/29/2013 0814   NA 139 05/17/2012 0530   K 5.0 06/29/2013 0814   K 3.7 05/17/2012 0530   CL 104 07/30/2012 0955   CL 100 05/17/2012 0530   CO2 26 06/29/2013 0814   CO2 33* 05/17/2012 0530   BUN 9.2 06/29/2013 0814   BUN 4* 05/17/2012 0530   CREATININE 0.7 06/29/2013 0814   CREATININE 0.56 05/17/2012 0530      Component Value Date/Time   CALCIUM 10.1 06/29/2013 0814   CALCIUM 8.7 05/17/2012 0530   ALKPHOS 94 06/29/2013 0814   ALKPHOS 103 05/10/2012 1440   AST 21 06/29/2013 0814   AST 25 05/10/2012 1440   ALT 18 06/29/2013 0814   ALT 17 05/10/2012 1440   BILITOT 0.38 06/29/2013 0814   BILITOT 0.4 05/10/2012 1440  Lab Results  Component Value Date   LABCA2 9 05/10/2012    No components found with this basename: [id number]    No results found for this basename: INR,  in the last 168 hours  Urinalysis    Component Value Date/Time   COLORURINE YELLOW 05/10/2012 [place] 05/10/2012 1444   LABSPEC 1.009 05/10/2012 1444   PHURINE 7.0 05/10/2012 1444   GLUCOSEU NEGATIVE 05/10/2012 1444   HGBUR NEGATIVE 05/10/2012 1444   BILIRUBINUR NEGATIVE 05/10/2012 1444   KETONESUR NEGATIVE 05/10/2012 1444   PROTEINUR NEGATIVE 05/10/2012 1444   UROBILINOGEN 0.2 05/10/2012 1444   NITRITE NEGATIVE 05/10/2012 1444   LEUKOCYTESUR MODERATE* 05/10/2012 1444    STUDIES: Mr Jeri Cos Wo Contrast  05/31/2013   CLINICAL DATA:  Headaches and nausea. History breast cancer. Question metastases.  EXAM: MRI HEAD WITHOUT AND WITH CONTRAST  TECHNIQUE: Multiplanar, multiecho pulse sequences of the brain and surrounding structures were obtained without and with intravenous contrast.  CONTRAST:  71m MULTIHANCE GADOBENATE DIMEGLUMINE 529 MG/ML IV SOLN  COMPARISON:  None.  FINDINGS: Mild generalized atrophy is present. Periventricular and subcortical white matter changes are advanced for age. No acute infarct, hemorrhage, or mass lesion is present.  Postcontrast images demonstrate no pathologic enhancement to suggest metastatic disease of the brain or meninges.  Flow is present in the major intracranial arteries. Mild mucosal thickening is present in the left maxillary sinus along with a small fluid level. The remaining paranasal sinuses and the mastoid air cells are clear.  IMPRESSION: 1. No evidence for metastatic disease of the brain. 2. No acute intracranial abnormality. 3. Periventricular and subcortical white matter disease is advanced for age. The finding is nonspecific but can be seen in the setting of chronic microvascular ischemia, a demyelinating process such as multiple sclerosis, vasculitis, complicated migraine headaches, or as the sequelae of a prior infectious or inflammatory process. 4. Acute left maxillary sinus disease.   Electronically Signed   By: CLawrence SantiagoM.D.   On: 0Apr 14, 201514:42   Nm Cardiac Muga Rest  06/09/2013   CLINICAL DATA:  Cardiac toxic chemotherapy, right  breast cancer on herceptin treatment  EXAM: NUCLEAR MEDICINE CARDIAC BLOOD POOL IMAGING (MUGA)  TECHNIQUE: Cardiac multi-gated acquisition was performed at rest following intravenous injection of Tc-961mabeled red blood cells.  RADIOPHARMACEUTICALS:  23MILLI CURIE ULTRATAG TECHNETIUM TC 6M-LABELED RED BLOOD CELLS IV KIT  COMPARISON:  03/09/2013  FINDINGS: Calculated left ventricular ejection fraction is 53%, normal.  This represents a decrease since the previous exam when the LVEF was calculated at 66%.  Cine analysis of the gated blood pool in 3 projections demonstrates normal left ventricular motion.  IMPRESSION: Normal left ventricular ejection fraction of 53% decreased since the 66% calculated on the previous study of 03/09/2013.  Normal left ventricular wall motion.   Electronically Signed   By: MaLavonia Dana.D.   On: 06/09/2013 15:39    ASSESSMENT: [id number] right breast T1 N0, stage IA, invasive ductal carcinoma, grade III, ER negative, PR negative, Ki-67 of 92%, HER-2/neu negative and left breast T1 N0, stage IA invasive ductal carcinoma, grade II, ER 100%, PR 11%, Ki-67 80%, HER-2/neu positive.    #4 patient is status post bilateral mastectomies performed on 05/14/2012 with immediate reconstruction. The final pathology revealed bilateral breast cancers. In the right breast tissue is noted to have a 1.2 cm invasive ductal carcinoma high-grade ER negative PR negative HER-2/neu negative with Ki-67 92% one node  was negative for metastatic disease but this was not the sentinel lymph node. On the left side the patient had multifocal disease measuring 1.4 cm, 1.2 cm, 0.1 cm. Tumor was ER +100% PR +11% HER-2/neu positive with a ratio 6.50. Ki-67 was 80% 8 sentinel nodes were negative for metastatic disease.   #5 patient has had immediate reconstruction with expanders bilaterally.   #6s/p adjuvant chemotherapy initially consisting of Adriamycin Cytoxan every 2 weeks for a total of  4 cycles. Given 07/02/12 - 08/12/12   #7This will then be followed by Taxol and Herceptin to be given weekly for 12 weeks time.beginning 08/27/12. This was discontinued due to neuropathy.   #8 Abraxane and Herceptin days 1, day 8, day 15 on a 28 day cycle. Beginning 09/03/2012 - 11/2012   #9 curative intent adjuvant Herceptin every 3 weeks beginning 01/05/2013   #10 curative intent adjuvant letrozole 2.5 mg daily starting 01/05/2013. Total of 5 years of therapy is planned   PLAN:  Patient is doing well today.  Her labs are stable.  I reviewed her MUGA with her and her husband today.  Her lvef is still normal and 53%.  It is 10% lower than the previous MUGA in January.  I discussed the risk of cardiotoxicity and Herceptin with the patient in detail.  I reviewed that the drop in the MUGA can be an early indicator of Herceptin toxicity.  The patient and the husband verbalized their understanding, but with the LVEF being normal they will proceed with treatment and will discuss with Dr. Berdine Addison, her cardiologist.  I told the patient that I would like to refer her to Dr. Haroldine Laws for evaluation prior to her next appointment and they said that they would get back to me about it.   The patient will return in 3 weeks for labs, evaluation, and possible herceptin.   She knows to call us in the interim for any questions or concerns.  We can certainly see her sooner if needed.  I spent 40 minutes counseling the patient face to face.  The total time spent in the appointment was 50 minutes.   Minette Headland, [place] [phone] 06/29/2013 9:53 AM

## 2013-07-12 ENCOUNTER — Ambulatory Visit: Payer: BC Managed Care – PPO | Admitting: Gastroenterology

## 2013-07-14 ENCOUNTER — Telehealth: Payer: Self-pay | Admitting: Physician Assistant

## 2013-07-14 ENCOUNTER — Telehealth: Payer: Self-pay | Admitting: *Deleted

## 2013-07-14 ENCOUNTER — Ambulatory Visit (INDEPENDENT_AMBULATORY_CARE_PROVIDER_SITE_OTHER): Payer: BC Managed Care – PPO | Admitting: Interventional Cardiology

## 2013-07-14 ENCOUNTER — Telehealth: Payer: Self-pay

## 2013-07-14 ENCOUNTER — Encounter: Payer: Self-pay | Admitting: Interventional Cardiology

## 2013-07-14 ENCOUNTER — Telehealth: Payer: Self-pay | Admitting: Adult Health

## 2013-07-14 VITALS — BP 132/76 | HR 85 | Ht 64.0 in | Wt 192.0 lb

## 2013-07-14 DIAGNOSIS — R0600 Dyspnea, unspecified: Secondary | ICD-10-CM

## 2013-07-14 DIAGNOSIS — R0609 Other forms of dyspnea: Secondary | ICD-10-CM

## 2013-07-14 DIAGNOSIS — C50911 Malignant neoplasm of unspecified site of right female breast: Secondary | ICD-10-CM

## 2013-07-14 DIAGNOSIS — C50912 Malignant neoplasm of unspecified site of left female breast: Secondary | ICD-10-CM

## 2013-07-14 DIAGNOSIS — R0789 Other chest pain: Secondary | ICD-10-CM

## 2013-07-14 DIAGNOSIS — C50919 Malignant neoplasm of unspecified site of unspecified female breast: Secondary | ICD-10-CM

## 2013-07-14 DIAGNOSIS — T451X5A Adverse effect of antineoplastic and immunosuppressive drugs, initial encounter: Secondary | ICD-10-CM

## 2013-07-14 DIAGNOSIS — R0989 Other specified symptoms and signs involving the circulatory and respiratory systems: Secondary | ICD-10-CM

## 2013-07-14 LAB — CK TOTAL AND CKMB (NOT AT ARMC)
CK, MB: <1 ng/mL (ref 0.3–4.0)
Total CK: 87 U/L (ref 7–177)

## 2013-07-14 LAB — BASIC METABOLIC PANEL
BUN: 8 mg/dL (ref 6–23)
CO2: 32 mEq/L (ref 19–32)
Calcium: 9.9 mg/dL (ref 8.4–10.5)
Chloride: 104 mEq/L (ref 96–112)
Creatinine, Ser: 0.7 mg/dL (ref 0.4–1.2)
GFR: 110.41 mL/min (ref >60.00)
Glucose, Bld: 117 mg/dL — ABNORMAL HIGH (ref 70–99)
Potassium: 4.3 mEq/L (ref 3.5–5.1)
Sodium: 144 mEq/L (ref 135–145)

## 2013-07-14 LAB — TROPONIN I: Troponin I: <0.3 ng/mL (ref <0.30)

## 2013-07-14 LAB — D-DIMER, QUANTITATIVE (NOT AT ARMC): D-Dimer, Quant: 0.66 ug/mL-FEU — ABNORMAL HIGH (ref 0.00–0.48)

## 2013-07-14 LAB — HEMOGLOBIN: Hemoglobin: 12.7 g/dL (ref 12.0–15.0)

## 2013-07-14 LAB — BRAIN NATRIURETIC PEPTIDE: Pro B Natriuretic peptide (BNP): 88 pg/mL (ref 0.0–100.0)

## 2013-07-14 NOTE — Telephone Encounter (Signed)
recommendations ifs for her to have a chest ct asap.adc her I would call early in the morning after talking with our CT dept.to have it done first thing in the morning.pt verbalized understanding

## 2013-07-14 NOTE — Patient Instructions (Addendum)
Your physician recommends that you continue on your current medications as directed. Please refer to the Current Medication list given to you today.  Labs Today: Bmet, Bnp, D-Dimer, Cpkmb, Troponin, Hemoglobin  Your physician recommends that you schedule a follow-up appointment as needed

## 2013-07-14 NOTE — Progress Notes (Signed)
Patient ID: [name] [name], female   DOB: [birth year], 63 y.o.   MRN: [id number]   Date: 07/14/2013 ID: [name] G Mcdonald, DOB 1950/07/06, MRN [id number] PCP: [name], MD  Reason: Dyspnea and chest discomfort  ASSESSMENT;  1. Dyspnea and chest discomfort starting after Herceptin therapy ~13 days ago 2. History of bilateral breast cancer now status post chemotherapy (Adria, etc) and receiving Herceptin, now cycle 6 3. Diabetes mellitus 4. Herceptin therapy with decrease in LVEF from 65% to 53% by most recent evaluation 4 weeks ago  PLAN:  1. EKG today shows no evidence of acute injury or arrhythmia. The tracing is normal.  2. BNP, troponin I., CK-MB, d-dimer, and hemoglobin 3. We'll get a basic metabolic panel 4. Consider diuretic therapy if BNP is elevated. 5. With the drop in LVEF, cessation of Herceptin therapy should be considered 6. Will get CT angiography chest if d-dimer is elevated   SUBJECTIVE: [name] G Safi is a 63 y.o. female who is undergoing therapy for breast cancer. She is status post chemotherapy and is now on Herceptin every 3 weeks. Approximately 10 days ago she received the most recent treatment, her 6. Since that time she has noted progressive dyspnea and chest discomfort. A MUGA scan done within the past week demonstrated an EF of 53% which is a fall from a pre-therapy he half of 65%. She denies orthopnea. There is no lower extremity swelling, tachycardia palpitations, or syncope.   Allergies  Allergen Reactions  . Darvon Hives and Itching    All over body  . Penicillins Hives and Itching    All over body    Current Outpatient Prescriptions on File Prior to Visit  Medication Sig Dispense Refill  . Ascorbic Acid (VITAMIN C) 500 MG tablet Take 500 mg by mouth daily.        . Biotin 10 MG TABS Take by mouth. 4000 mcg daily      . Calcium Citrate (CITRACAL PO) Take by mouth.      . Cholecalciferol (VITAMIN D PO) Take 1 tablet by mouth daily.        Mariane Baumgarten Sodium (COLACE PO) Take by mouth.      Marland Kitchen KRILL OIL PO Take by mouth.      . letrozole (FEMARA) 2.5 MG tablet Take 1 tablet (2.5 mg total) by mouth daily.  90 tablet  12  . lidocaine-prilocaine (EMLA) cream Apply topically as needed.  30 g  6  . Multiple Vitamin (MULTIVITAMIN PO) Take 1 tablet by mouth daily.       . Probiotic Product (PROBIOTIC DAILY PO) Take by mouth daily.      . rosuvastatin (CRESTOR) 5 MG tablet Take 5 mg by mouth every Monday, Wednesday, and Friday.      . sitaGLIPtin (JANUVIA) 100 MG tablet Take 100 mg by mouth daily.      Marland Kitchen UNABLE TO FIND Apply 1 Device topically once. Cranial prosthesis r/t chemotherapy treatment  1 Device  0   No current facility-administered medications on file prior to visit.    Past Medical History  Diagnosis Date  . Heart murmur   . Diabetes mellitus without complication   . Osteopenia   . Cancer     breast  . Bronchitis   . Pneumonia   . Hiatal hernia   . Sinus problem   . Diabetes mellitus   . Breast cancer 04/15/12    left-biopsy  . Breast cancer, right breast 05/14/12    right mastectomy  .  CHF with unknown LVEF 03/09/2013    Past Surgical History  Procedure Laterality Date  . Cesarean section  1981, 1983  . Tummy tuck  1999  . Tonsillectomy    . Foot surgery    . Gastric bypass    . Wound debridement  01/14/2011    Procedure: DEBRIDEMENT ABDOMINAL WOUND;  Surgeon: Adin Hector, MD;  Location: [place];  Service: General;  Laterality: N/A;  wound debridement and debridement on the abdomen  . Cosmetic surgery    . Total mastectomy Bilateral 05/14/2012    Procedure: TOTAL MASTECTOMY;  Surgeon: Adin Hector, MD;  Location: [place];  Service: General;  Laterality: Bilateral;  . Axillary sentinel node biopsy Left 05/14/2012    Procedure: AXILLARY SENTINEL NODE BIOPSY;  Surgeon: Adin Hector, MD;  Location: [place];  Service: General;  Laterality: Left;  . Portacath placement N/A 05/14/2012     Procedure: INSERTION PORT-A-CATH;  Surgeon: Adin Hector, MD;  Location: [place];  Service: General;  Laterality: N/A;  . Breast reconstruction with placement of tissue expander and flex hd (acellular hydrated dermis) Bilateral 05/14/2012    Procedure: BREAST RECONSTRUCTION WITH PLACEMENT OF TISSUE EXPANDER AND FLEX HD (ACELLULAR HYDRATED DERMIS);  Surgeon: Crissie Reese, MD;  Location: [place];  Service: Plastics;  Laterality: Bilateral;  . Bunionectomy      History   Social History  . Marital Status: Married    Spouse Name: Joe    Number of Children: 2  . Years of Education: 18   Occupational History  . Glass blower/designer    Social History Main Topics  . Smoking status: Former Research scientist (life sciences)  . Smokeless tobacco: Never Used  . Alcohol Use: 1.2 - 1.8 oz/week    2-3 Glasses of wine per week  . Drug Use: No  . Sexual Activity: Yes    Partners: Male    Birth Control/ Protection: Post-menopausal   Other Topics Concern  . Not on file   Social History Narrative  . No narrative on file    Family History  Problem Relation Age of Onset  . Heart attack Father   . Diabetes Father   . Heart attack Brother   . Diabetes Brother   . Diabetes Sister   . Breast cancer Sister 27  . Diabetes Mother   . Breast cancer Mother 22  . Parkinson's disease Sister   . Heart attack Brother     ROS: No chills or fever. No chest wall tenderness. Not sleeping well.. Other systems negative for complaints.  OBJECTIVE: BP 132/76  Pulse 85  Ht 5\' 4"  (1.626 m)  Wt 192 lb (87.091 kg)  BMI 32.94 kg/m2,  General: No acute distress, appears healthy and in no distress although when she lies flat there is evidence of increased respiratory effort HEENT: normal but with mild conjunctival pallor Neck: JVD moderate elevation. Carotids absent bruit Chest: Clear Cardiac: Murmur: Soft systolic flow murmur. Gallop: S4. Rhythm: Normal. Other: Normal Abdomen: Bruit: Absent. Pulsation: None Extremities: Edema: Absent.  Pulses: 2+ Neuro: Normal Psych: Normal  ECG: Normal

## 2013-07-14 NOTE — Telephone Encounter (Signed)
Message copied by Lamar Laundry on Thu Jul 14, 2013  6:25 PM ------      Message from: Daneen Schick      Created: Thu Jul 14, 2013  5:34 PM       Elevated D-dimer. Needs chest CT with contrast ASAP! ------

## 2013-07-14 NOTE — Telephone Encounter (Signed)
Lab called.  DDimer  0.66.  Tarri Fuller

## 2013-07-14 NOTE — Telephone Encounter (Signed)
Spouse called reporting "[name] [name] is not doing well, would you like to speak with her?"  Replied yes I would like to speak with her but he says "now she can't, she feels bad.  She is a patient of Dr. Humphrey Rolls, on Herceptin, having pain to left chest, short of breath and tired."  Informed Mr. Vasconez [name] [name] needs to go to the emergency room due to chest pain.  "She had a MUGA and her function dropped from 66% to 53%."   Again stated she needs to go to the nearest emergency room.  Will notify providers.

## 2013-07-15 ENCOUNTER — Telehealth: Payer: Self-pay

## 2013-07-15 ENCOUNTER — Telehealth: Payer: Self-pay | Admitting: *Deleted

## 2013-07-15 ENCOUNTER — Other Ambulatory Visit: Payer: Self-pay | Admitting: Adult Health

## 2013-07-15 ENCOUNTER — Ambulatory Visit (INDEPENDENT_AMBULATORY_CARE_PROVIDER_SITE_OTHER)
Admission: RE | Admit: 2013-07-15 | Discharge: 2013-07-15 | Disposition: A | Discharge status: Home / Self Care | Payer: BC Managed Care – PPO | Source: Ambulatory Visit | Attending: Interventional Cardiology | Admitting: Interventional Cardiology

## 2013-07-15 DIAGNOSIS — R0600 Dyspnea, unspecified: Secondary | ICD-10-CM

## 2013-07-15 DIAGNOSIS — R0609 Other forms of dyspnea: Secondary | ICD-10-CM

## 2013-07-15 DIAGNOSIS — R0989 Other specified symptoms and signs involving the circulatory and respiratory systems: Secondary | ICD-10-CM

## 2013-07-15 MED ORDER — IOHEXOL 350 MG/ML SOLN
80.0000 mL | Freq: Once | INTRAVENOUS | Status: AC | PRN
  Administered 2013-07-15: 80 mL via INTRAVENOUS

## 2013-07-15 NOTE — Telephone Encounter (Signed)
Follow Up:  Pt is returning Lisa's call. Requests a call back on husbands cell number [phone]

## 2013-07-15 NOTE — Telephone Encounter (Signed)
lmom on all lines avaliable for pt.called to give pt chest st results.lmtcb

## 2013-07-15 NOTE — Telephone Encounter (Signed)
returned pt call.adv pt the chest ct was negative for PE.adv pt that a message was fwd to Dr.Smith. adv pt I will call back if Dr.Smith recommends any further work up.pt verbalized understanding.

## 2013-07-15 NOTE — Telephone Encounter (Signed)
pt husband adv that pt chest ct is scheduled today at 10:45am pt to arrive at 10:30am.pt husband verbalized understanding

## 2013-07-15 NOTE — Telephone Encounter (Signed)
Patient called and left message requesting to speak with Mendel Ryder regarding appts with Cardiology, Dr Jana Hakim and questions of Herceptin. Attempted to call patient back at home as requested, no answer or voicemail available.

## 2013-07-17 NOTE — Telephone Encounter (Signed)
Patient has appt with Dr. Haroldine Laws on Monday, 6/2 at 1120.  I have put in the scheduling request for her to see Dr. Jana Hakim.  I called and spoke in detail with the patient about these two things on Thursday, 5/29 in the afternoon.    Thanks,  Norris

## 2013-07-18 ENCOUNTER — Ambulatory Visit (HOSPITAL_COMMUNITY)
Admission: RE | Admit: 2013-07-18 | Discharge: 2013-07-18 | Disposition: A | Discharge status: Home / Self Care | Payer: BC Managed Care – PPO | Source: Ambulatory Visit | Attending: Internal Medicine | Admitting: Internal Medicine

## 2013-07-18 ENCOUNTER — Other Ambulatory Visit: Payer: Self-pay

## 2013-07-18 ENCOUNTER — Telehealth: Payer: Self-pay

## 2013-07-18 ENCOUNTER — Encounter (HOSPITAL_COMMUNITY): Payer: Self-pay

## 2013-07-18 VITALS — BP 120/62 | HR 87 | Ht 64.0 in | Wt 192.4 lb

## 2013-07-18 DIAGNOSIS — R079 Chest pain, unspecified: Secondary | ICD-10-CM

## 2013-07-18 DIAGNOSIS — C50911 Malignant neoplasm of unspecified site of right female breast: Secondary | ICD-10-CM

## 2013-07-18 DIAGNOSIS — C50919 Malignant neoplasm of unspecified site of unspecified female breast: Secondary | ICD-10-CM | POA: Insufficient documentation

## 2013-07-18 DIAGNOSIS — R0989 Other specified symptoms and signs involving the circulatory and respiratory systems: Secondary | ICD-10-CM

## 2013-07-18 DIAGNOSIS — T451X5A Adverse effect of antineoplastic and immunosuppressive drugs, initial encounter: Secondary | ICD-10-CM

## 2013-07-18 DIAGNOSIS — Z87891 Personal history of nicotine dependence: Secondary | ICD-10-CM | POA: Insufficient documentation

## 2013-07-18 DIAGNOSIS — I427 Cardiomyopathy due to drug and external agent: Secondary | ICD-10-CM

## 2013-07-18 DIAGNOSIS — I2510 Atherosclerotic heart disease of native coronary artery without angina pectoris: Secondary | ICD-10-CM

## 2013-07-18 DIAGNOSIS — R0600 Dyspnea, unspecified: Secondary | ICD-10-CM

## 2013-07-18 DIAGNOSIS — I429 Cardiomyopathy, unspecified: Secondary | ICD-10-CM

## 2013-07-18 DIAGNOSIS — R0609 Other forms of dyspnea: Secondary | ICD-10-CM

## 2013-07-18 MED ORDER — CARVEDILOL 3.125 MG PO TABS: 3.1250 mg | ORAL_TABLET | Freq: Two times a day (BID) | ORAL | Status: DC

## 2013-07-18 NOTE — Patient Instructions (Addendum)
Follow in 1 month   Take carvedilol 3.125 mg twice a day  Do the following things EVERYDAY: 1) Weigh yourself in the morning before breakfast. Write it down and keep it in a log. 2) Take your medicines as prescribed 3) Eat low salt foods-Limit salt (sodium) to 2000 mg per day.  4) Stay as active as you can everyday 5) Limit all fluids for the day to less than 2 liters

## 2013-07-18 NOTE — Progress Notes (Signed)
Patient ID: [name] G Leaton, female   DOB: 01/28/1951, 63 y.o.   MRN: [id number]   PCP: [name], MD  GYN: Dr Charlesetta Garibaldi  SU: Dr. Fanny Skates  OTHER MD: Dr. Harlow Mares, plastic surgery Cardiologist: Dr Tamala Julian    HPI: Mrs Oppenheimer is a 63 year old with a history of right breast T1 N0, stage IA, invasive ductal carcinoma, grade III, HER-2/neu negative and  L breast cancer  T1 N0, stage IA invasive ductal carcinoma, grade II,HER-2/neu positive.  She is received Adriamycin Cytoxan for a total of 4 cycles. Given 07/02/12 - 08/12/12 followed by Taxol and Herceptin to be given weekly for 12 weeks time beginning 08/27/12.  She stopped Herceptin earlier in May due to chest pain and neck pain. She was evaluated by Dr Tamala Julian in late May with recommendations for CT of chest which was negative for PE but did show advanced coronary artery atherosclerosis.    02/2013 EF 55-60% Lateral S' 8.7 no GLS measured  Muga 05/2013 -Normal left ventricular ejection fraction of 53% decreased since the 63% calculated on the previous study of 03/09/2013.  She has had increased dyspnea with exertion after her last Herceptin treatment. Complains of chest pain at rest or exertion. Can last up to 1 hour. Unable to walk her usual 3 miles on the treadmill. Prior to chemo she could walk 6 miles on the treadmill.   FH: Father CAD MI SH: Lives with husband. Former smoker quit 8 years ago.   Review of Systems:     Cardiac Review of Systems: {Y] = yes _0  = no  Chest Pain [Y    ]  Resting SOB [   ] Exertional SOB  [Y  ]  Orthopnea [+  ]   Pedal Edema [   ]    Palpitations [  ] Syncope  [  ]   Presyncope [   ]  General Review of Systems: [Y] = yes [  ]=no Constitional: recent weight change [ Y ]; anorexia [  ]; fatigue [Y  ]; nausea [  ]; night sweats [  ]; fever [  ]; or chills [  ];                                                                                                                                          Dental:  poor dentition[  ]; Last Dentist visit:   Eye : blurred vision [  ]; diplopia [   ]; vision changes [  ];  Amaurosis fugax[  ]; Resp: cough [  ];  wheezing[  ];  hemoptysis[  ]; shortness of breath[ Y ]; paroxysmal nocturnal dyspnea[  ]; dyspnea on exertion[Y  ]; or orthopnea[  ];  GI:  gallstones[  ], vomiting[  ];  dysphagia[  ]; melena[  ];  hematochezia [  ]; heartburn[  ];   Hx  of  Colonoscopy[  ]; GU: kidney stones [  ]; hematuria[  ];   dysuria [  ];  nocturia[  ];  history of     obstruction [  ];                 Skin: rash, swelling[  ];, hair loss[  ];  peripheral edema[  ];  or itching[  ]; Musculosketetal: myalgias[  ];  joint swelling[  ];  joint erythema[  ];  joint pain[  ];  back pain[  ];  Heme/Lymph: bruising[  ];  bleeding[  ];  anemia[  ];  Neuro: TIA[  ];  headaches[  ];  stroke[  ];  vertigo[  ];  seizures[  ];   paresthesias[  ];  difficulty walking[  ];  Psych:depression[  ]; anxiety[  ];  Endocrine: diabetes[Y  ];  thyroid dysfunction[  ];  Immunizations: Flu [  ]; Pneumococcal[  ];  Other:    Past Medical History  Diagnosis Date  . Heart murmur   . Diabetes mellitus without complication   . Osteopenia   . Cancer     breast  . Bronchitis   . Pneumonia   . Hiatal hernia   . Sinus problem   . Diabetes mellitus   . Breast cancer 04/15/12    left-biopsy  . Breast cancer, right breast 05/14/12    right mastectomy  . CHF with unknown LVEF 03/09/2013    Current Outpatient Prescriptions  Medication Sig Dispense Refill  . Ascorbic Acid (VITAMIN C) 500 MG tablet Take 500 mg by mouth daily.        . Biotin 10 MG TABS Take by mouth. 4000 mcg daily      . Calcium Citrate (CITRACAL PO) Take by mouth.      . Cholecalciferol (VITAMIN D PO) Take 1 tablet by mouth daily.      Mariane Baumgarten Sodium (COLACE PO) Take by mouth.      Marland Kitchen KRILL OIL PO Take by mouth.      . letrozole (FEMARA) 2.5 MG tablet Take 1 tablet (2.5 mg total) by mouth daily.  90 tablet  12  .  lidocaine-prilocaine (EMLA) cream Apply topically as needed.  30 g  6  . Multiple Vitamin (MULTIVITAMIN PO) Take 1 tablet by mouth daily.       . Probiotic Product (PROBIOTIC DAILY PO) Take by mouth daily.      . rosuvastatin (CRESTOR) 5 MG tablet Take 5 mg by mouth every Monday, Wednesday, and Friday.      . sitaGLIPtin (JANUVIA) 100 MG tablet Take 100 mg by mouth daily.      Marland Kitchen UNABLE TO FIND Apply 1 Device topically once. Cranial prosthesis r/t chemotherapy treatment  1 Device  0   No current facility-administered medications for this encounter.     Allergies  Allergen Reactions  . Darvon Hives and Itching    All over body  . Penicillins Hives and Itching    All over body    History   Social History  . Marital Status: Married    Spouse Name: Joe    Number of Children: 2  . Years of Education: 18   Occupational History  . Glass blower/designer    Social History Main Topics  . Smoking status: Former Research scientist (life sciences)  . Smokeless tobacco: Never Used  . Alcohol Use: 1.2 - 1.8 oz/week    2-3 Glasses of wine per week  . Drug Use: No  .  Sexual Activity: Yes    Partners: Male    Birth Control/ Protection: Post-menopausal   Other Topics Concern  . Not on file   Social History Narrative  . No narrative on file    Family History  Problem Relation Age of Onset  . Heart attack Father   . Diabetes Father   . Heart attack Brother   . Diabetes Brother   . Diabetes Sister   . Breast cancer Sister 85  . Diabetes Mother   . Breast cancer Mother 58  . Parkinson's disease Sister   . Heart attack Brother     PHYSICAL EXAM: Filed Vitals:   07/18/13 1117  BP: 120/62  Pulse: 87   General:  Well appearing. No respiratory difficulty Husband present  HEENT: normal Neck: supple. no JVD. Carotids 2+ bilat; no bruits. No lymphadenopathy or thryomegaly appreciated. Cor: PMI nondisplaced. Regular rate & rhythm. No rubs, gallops or murmurs. Lungs: clear Abdomen: soft, nontender, nondistended.  No hepatosplenomegaly. No bruits or masses. Good bowel sounds. Extremities: no cyanosis, clubbing, rash, edema Neuro: alert & oriented x 3, cranial nerves grossly intact. moves all 4 extremities w/o difficulty. Affect pleasant.    No results found for this or any previous visit (from the past 24 hour(s)). No results found.   ASSESSMENT & PLAN:  1. R/L Breast Cancer - Rright breast T1 N0, stage IA, invasive ductal carcinoma, grade III, HER-2/neu negative and  L breast cancer  T1 N0, stage IA invasive ductal carcinoma, grade II,HER-2/neu positive.She is received Adriamycin Cytoxan for a total of 4 cycles. Given 07/02/12 - 08/12/12 followed by Taxol and Herceptin. She completed Herceptin early due to chest pain in early May.   Explained that there is about a 30% chance of cardiotoxicity due to Adriamycin and Herceptin. Dr Haroldine Laws discussed and reviewed ECHO and Muga. Mild reduction noted on Muga. Add carvedilol 3.125 mg twice a day.  Check ECHO.   2. CP- Refer back to Dr Tamala Julian. Can consider stress test.     Follow up in 1 month with an ECHO   Amy D Clegg NP-C  12:31 PM  Patient seen and examined with Darrick Grinder, NP. We discussed all aspects of the encounter. I agree with the assessment and plan as stated above.   Difficult situation. I reviewed her most recent echo and EF looks normal. However there has been nearly a 15% drop in EF by MUGA which is concerning for chemo-induced cardiotoxicity. This has been accompanied by signifcant DOE despite no clear evidence of HF on exam. At this point we will repeat echo and add carvedilol 3.125 bid for cardio-protection. I discussed with Dr. Tamala Julian who will also f/u with stress test given her symptoms and significant coronary calcifications seen on CT.   Continue to hold Herceptin for now. We discussed possibly restarting this in the future but she is very reluctant. We can revisit in time.   I will see back in 1 month.   Shaune Pascal Bensimhon,MD 9:11  PM

## 2013-07-18 NOTE — Telephone Encounter (Signed)
pt given Dr.Smith recommendations to have a lexiscan.pt given verbal instructions.verbal instructions mailed.pt aware of lexi sch for 6/10 @12pm .pt verbalized understanding.

## 2013-07-19 ENCOUNTER — Telehealth: Payer: Self-pay | Admitting: Oncology

## 2013-07-19 DIAGNOSIS — T451X5A Adverse effect of antineoplastic and immunosuppressive drugs, initial encounter: Secondary | ICD-10-CM | POA: Insufficient documentation

## 2013-07-19 DIAGNOSIS — I427 Cardiomyopathy due to drug and external agent: Secondary | ICD-10-CM | POA: Insufficient documentation

## 2013-07-19 DIAGNOSIS — R0600 Dyspnea, unspecified: Secondary | ICD-10-CM | POA: Insufficient documentation

## 2013-07-19 NOTE — Telephone Encounter (Signed)
, °

## 2013-07-20 ENCOUNTER — Other Ambulatory Visit: Payer: BC Managed Care – PPO

## 2013-07-20 ENCOUNTER — Ambulatory Visit: Payer: BC Managed Care – PPO

## 2013-07-20 ENCOUNTER — Ambulatory Visit: Payer: BC Managed Care – PPO | Admitting: Oncology

## 2013-07-20 ENCOUNTER — Ambulatory Visit: Payer: BC Managed Care – PPO | Admitting: Adult Health

## 2013-07-25 ENCOUNTER — Ambulatory Visit (HOSPITAL_BASED_OUTPATIENT_CLINIC_OR_DEPARTMENT_OTHER): Payer: BC Managed Care – PPO | Admitting: Oncology

## 2013-07-25 ENCOUNTER — Ambulatory Visit (HOSPITAL_COMMUNITY)
Admission: RE | Admit: 2013-07-25 | Discharge: 2013-07-25 | Disposition: A | Discharge status: Home / Self Care | Payer: BC Managed Care – PPO | Source: Ambulatory Visit | Attending: Internal Medicine | Admitting: Internal Medicine

## 2013-07-25 ENCOUNTER — Telehealth: Payer: Self-pay | Admitting: Oncology

## 2013-07-25 VITALS — BP 135/61 | HR 82 | Temp 99.2°F | Resp 18 | Ht 64.0 in | Wt 192.8 lb

## 2013-07-25 DIAGNOSIS — IMO0001 Reserved for inherently not codable concepts without codable children: Secondary | ICD-10-CM

## 2013-07-25 DIAGNOSIS — Z79811 Long term (current) use of aromatase inhibitors: Secondary | ICD-10-CM

## 2013-07-25 DIAGNOSIS — Z9010 Acquired absence of unspecified breast and nipple: Secondary | ICD-10-CM

## 2013-07-25 DIAGNOSIS — C50911 Malignant neoplasm of unspecified site of right female breast: Secondary | ICD-10-CM

## 2013-07-25 DIAGNOSIS — I519 Heart disease, unspecified: Secondary | ICD-10-CM

## 2013-07-25 DIAGNOSIS — C50019 Malignant neoplasm of nipple and areola, unspecified female breast: Secondary | ICD-10-CM | POA: Insufficient documentation

## 2013-07-25 DIAGNOSIS — C50919 Malignant neoplasm of unspecified site of unspecified female breast: Secondary | ICD-10-CM

## 2013-07-25 DIAGNOSIS — I509 Heart failure, unspecified: Secondary | ICD-10-CM

## 2013-07-25 DIAGNOSIS — C50912 Malignant neoplasm of unspecified site of left female breast: Secondary | ICD-10-CM

## 2013-07-25 NOTE — Telephone Encounter (Signed)
per pof to sch appts-sch appt & gave pt copy of sch

## 2013-07-25 NOTE — Telephone Encounter (Signed)
per pof to sch pt appt-gave pt copy of sch °

## 2013-07-25 NOTE — Progress Notes (Signed)
[place]  Telephone:(336) [phone] Fax:(336) [phone]     ID: [name] [name] OB: [birth year]  MR#: [id number]  UTM#:[id number]  PCP: [name], MD GYN:  Crawford Givens SU: Fanny Skates OTHER MD: Crissie Reese, Daneen Schick  CHIEF COMPLAINT: bilateral breast cancer CURRENT TREATMENT: anti-estrogen therapy  BREAST CANCER HISTORY: From Dr. Dana Allan original intake note, 04/27/2012:  "[name] [name] is a 63 y.o. female. With medical history significant for diabetes, heart murmurs, hiatal hernia. Patient's labs normal mammogram was 2 years ago. This year she went on to have a screening mammogram performed that showed 2 areas of concern on the left side.calcifications were noted in the left. In the right breast a possible mass warrants in further evaluation.  On 03/29/2012 patient underwent a bilateral diagnostic mammogram and right breast ultrasound. A spot magnification images demonstrate suspicious group of pleomorphic microcalcifications over the outer lower left breast with additional suspicious group of pleomorphic microcalcifications over the outer midportion of the left periareolar region. Spot compression images of the right breast demonstrate persistence of a 1 cm density at the edge of the film in the deep third of the right inner breast. Ultrasound performed showed no focal abnormality over the entire in her right breast to correspond to the mammographic density. Patient was recommended stereotactic core needle biopsy of the 2 groups of suspicious left breast microcalcifications. Because patient and her husband wanted bilateral mastectomies MRI was not performed.on 04/15/2012 patient had needle core biopsy performed of the 2 areas in the left breast. The left needle core biopsy in the lower breast revealed ductal carcinoma in situ with associated comedo necrosis and calcifications with the in situ carcinoma. It was ER +100% PR +12%. 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 Tumor was ER +100% PR +11% proliferation marker Ki-67 elevated at 80% HER-2/neu showed amplification with a ratio of 6.50. Patient was seen by Dr. Fanny Skates. Patient and her family desire bilateral mastectomies with immediate reconstruction. She is now seen in medical oncology for discussion of adjuvant therapy since patient does have a HER-2 positive ER positive breast cancer. Overall she's doing well she is accompanied by her husband and she is without any complaints. Her case was discussed at the multidisciplinary breast conference. Pathology and radiology were reviewed."  Her subsequent history is detailed below  INTERVAL HISTORY: Returns today with her husband Joe for followup of her breast cancer. She is establishing herself on my service today. [name] was receiving trastuzumab every 3 weeks until mid May. She had an echocardiogram 03/01/2013 which showed a normal ejection fraction in the 50-55% range, but nevertheless representing a significant drop. A cardiac MUGA 06/09/2013 confirmed an ejection fraction of 53%, decreased from 66% on the rest MUGA obtained 03/09/2013. The patient was referred to Dr. Pierre Bali who reviewed all prior cardiac studies. He felt that even though the most recent echo looks normal, given a 15% drop in the ejection fraction there was sufficient concern regarding chemotherapy induced cardiotoxicity that he recommended holding Herceptin. He also added carvedilol to the patient's medications.  REVIEW OF SYSTEMS: [name] still feels "not back to baseline". She used to exercise extensively almost every day, but now she gets tired more easily. She thinks this may be getting better, possibly because of the beta blocker just added. She also has pain in her chest and joints and back. This is relatively new. Because the symptoms could easily be due  to letrozole, Dr.Khan took her off  letrozole for 6 weeks. A few months ago. There was absolutely no improvement in her arthralgias or myalgias, and so the letrozole was reserved. She has some seasonal allergy symptoms, can be short of breath even at rest sometimes, occasionally has nausea and vomiting and of course she is status post gastric bypass surgery at [facility] complicated by perforation of a marginal ulcer at the gastrojejunostomy site. She feels forgetful. She denies anxiety or depression. She is having moderate hot flashes and significant vaginal dryness symptoms. A detailed review of systems is otherwise noncontributory  PAST MEDICAL HISTORY: Past Medical History  Diagnosis Date  . Heart murmur   . Diabetes mellitus without complication   . Osteopenia   . Cancer     breast  . Bronchitis   . Pneumonia   . Hiatal hernia   . Sinus problem   . Diabetes mellitus   . Breast cancer 04/15/12    left-biopsy  . Breast cancer, right breast 05/14/12    right mastectomy  . CHF with unknown LVEF 03/09/2013    PAST SURGICAL HISTORY: Past Surgical History  Procedure Laterality Date  . Cesarean section  1981, 1983  . Tummy tuck  1999  . Tonsillectomy    . Foot surgery    . Gastric bypass    . Wound debridement  01/14/2011    Procedure: DEBRIDEMENT ABDOMINAL WOUND;  Surgeon: Adin Hector, MD;  Location: [place];  Service: General;  Laterality: N/A;  wound debridement and debridement on the abdomen  . Cosmetic surgery    . Total mastectomy Bilateral 05/14/2012    Procedure: TOTAL MASTECTOMY;  Surgeon: Adin Hector, MD;  Location: [place];  Service: General;  Laterality: Bilateral;  . Axillary sentinel node biopsy Left 05/14/2012    Procedure: AXILLARY SENTINEL NODE BIOPSY;  Surgeon: Adin Hector, MD;  Location: [place];  Service: General;  Laterality: Left;  . Portacath placement N/A 05/14/2012    Procedure: INSERTION PORT-A-CATH;  Surgeon: Adin Hector, MD;  Location: [facility] [facility];  Service: General;   Laterality: N/A;  . Breast reconstruction with placement of tissue expander and flex hd (acellular hydrated dermis) Bilateral 05/14/2012    Procedure: BREAST RECONSTRUCTION WITH PLACEMENT OF TISSUE EXPANDER AND FLEX HD (ACELLULAR HYDRATED DERMIS);  Surgeon: Crissie Reese, MD;  Location: [place];  Service: Plastics;  Laterality: Bilateral;  . Bunionectomy      FAMILY HISTORY Family History  Problem Relation Age of Onset  . Heart attack Father   . Diabetes Father   . Heart attack Brother   . Diabetes Brother   . Diabetes Sister   . Breast cancer Sister 34  . Diabetes Mother   . Breast cancer Mother 14  . Parkinson's disease Sister   . Heart attack Brother    the patient's father died 2 days after prostate cancer surgery, possibly from a blood clot. The patient's mother lived to be 56 years old. She had been diagnosed with breast cancer around the age of 20. The patient has 3 brothers, 4 sisters. One sister was diagnosed with breast cancer at the age of 16. There is no history of ovarian cancer in the family. The patient underwent genetic testing October 2014, with a VUS as the only finding  GYNECOLOGIC HISTORY:  Menarche age 29, first live birth age 17, the patient is [place] P2. She went through the change of life approximately age 36. 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 He is Broadus John "Joe" Grimley, who is a former judge and currently a Architectural technologist. The patient's daughter Garnette Scheuermann is an attorney in [place]. The patient's son Broadus John completed a Masters in Librarian, academic in [place]. The patient has 2 granddaughters.    ADVANCED DIRECTIVES:    HEALTH MAINTENANCE: History  Substance Use Topics  . Smoking status: Former Research scientist (life sciences)  . Smokeless tobacco: Never Used  . Alcohol Use: 1.2 - 1.8 oz/week    2-3 Glasses of wine per week     Colonoscopy:  PAP:  Bone density: January 2015  Lipid  panel:  Allergies  Allergen Reactions  . Darvon Hives and Itching    All over body  . Penicillins Hives and Itching    All over body    Current Outpatient Prescriptions  Medication Sig Dispense Refill  . Ascorbic Acid (VITAMIN C) 500 MG tablet Take 500 mg by mouth daily.        . Biotin 10 MG TABS Take by mouth. 4000 mcg daily      . Calcium Citrate (CITRACAL PO) Take by mouth.      . carvedilol (COREG) 3.125 MG tablet Take 1 tablet (3.125 mg total) by mouth 2 (two) times daily with a meal.  60 tablet  3  . Cholecalciferol (VITAMIN D PO) Take 1 tablet by mouth daily.      Mariane Baumgarten Sodium (COLACE PO) Take by mouth.      Marland Kitchen KRILL OIL PO Take by mouth.      . letrozole (FEMARA) 2.5 MG tablet Take 1 tablet (2.5 mg total) by mouth daily.  90 tablet  12  . lidocaine-prilocaine (EMLA) cream Apply topically as needed.  30 g  6  . Multiple Vitamin (MULTIVITAMIN PO) Take 1 tablet by mouth daily.       . Probiotic Product (PROBIOTIC DAILY PO) Take by mouth daily.      . rosuvastatin (CRESTOR) 5 MG tablet Take 5 mg by mouth every Monday, Wednesday, and Friday.      . sitaGLIPtin (JANUVIA) 100 MG tablet Take 100 mg by mouth daily.      Marland Kitchen UNABLE TO FIND Apply 1 Device topically once. Cranial prosthesis r/t chemotherapy treatment  1 Device  0   No current facility-administered medications for this visit.    OBJECTIVE: Middle-aged [place] American woman who appears stated age 66 Vitals:   07/25/13 0902  BP: 135/61  Pulse: 82  Temp: 99.2 F (37.3 C)  Resp: 18     Body mass index is 33.08 kg/(m^2).    ECOG FS:1 - Symptomatic but completely ambulatory  Ocular: Sclerae unicteric, pupils round and equal Ear-nose-throat: Oropharynx clear and moist Lymphatic: No cervical or supraclavicular adenopathy Lungs no rales or rhonchi, good excursion bilaterally Heart regular rate and rhythm, no murmur appreciated Abd soft, nontender, positive bowel sounds, scars showing no unusual induration or  dehiscence MSK no focal spinal tenderness, no upper extremity lymphedema Neuro: non-focal, well-oriented, appropriate affect Breasts: Status post bilateral mastectomies with expander is in place. There is no evidence of local recurrence. Both axillae are benign.  LAB RESULTS:  CMP     Component Value Date/Time   NA 144 07/14/2013 1516   NA 143 06/29/2013 0814   K 4.3 07/14/2013 1516   K 5.0 06/29/2013 0814   CL 104 07/14/2013 1516   CL 104 07/30/2012 0955   CO2 32 07/14/2013 1516  CO2 26 06/29/2013 0814   GLUCOSE 117* 07/14/2013 1516   GLUCOSE 152* 06/29/2013 0814   GLUCOSE 138* 07/30/2012 0955   BUN 8 07/14/2013 1516   BUN 9.2 06/29/2013 0814   CREATININE 0.7 07/14/2013 1516   CREATININE 0.7 06/29/2013 0814   CALCIUM 9.9 07/14/2013 1516   CALCIUM 10.1 06/29/2013 0814   PROT 6.6 06/29/2013 0814   PROT 7.4 05/10/2012 1440   ALBUMIN 3.6 06/29/2013 0814   ALBUMIN 4.1 05/10/2012 1440   AST 21 06/29/2013 0814   AST 25 05/10/2012 1440   ALT 18 06/29/2013 0814   ALT 17 05/10/2012 1440   ALKPHOS 94 06/29/2013 0814   ALKPHOS 103 05/10/2012 1440   BILITOT 0.38 06/29/2013 0814   BILITOT 0.4 05/10/2012 1440   GFRNONAA >90 05/17/2012 0530   GFRAA >90 05/17/2012 0530    I No results found for this basename: SPEP, UPEP,  kappa and lambda light chains    Lab Results  Component Value Date   WBC 4.3 06/29/2013   NEUTROABS 2.6 06/29/2013   HGB 12.7 07/14/2013   HCT 38.5 06/29/2013   MCV 84.2 06/29/2013   PLT 245 06/29/2013      Chemistry      Component Value Date/Time   NA 144 07/14/2013 1516   NA 143 06/29/2013 0814   K 4.3 07/14/2013 1516   K 5.0 06/29/2013 0814   CL 104 07/14/2013 1516   CL 104 07/30/2012 0955   CO2 32 07/14/2013 1516   CO2 26 06/29/2013 0814   BUN 8 07/14/2013 1516   BUN 9.2 06/29/2013 0814   CREATININE 0.7 07/14/2013 1516   CREATININE 0.7 06/29/2013 0814      Component Value Date/Time   CALCIUM 9.9 07/14/2013 1516   CALCIUM 10.1 06/29/2013 0814   ALKPHOS 94 06/29/2013 0814   ALKPHOS 103  05/10/2012 1440   AST 21 06/29/2013 0814   AST 25 05/10/2012 1440   ALT 18 06/29/2013 0814   ALT 17 05/10/2012 1440   BILITOT 0.38 06/29/2013 0814   BILITOT 0.4 05/10/2012 1440       Lab Results  Component Value Date   LABCA2 9 05/10/2012    No components found with this basename: [id number]    No results found for this basename: INR,  in the last 168 hours  Urinalysis    Component Value Date/Time   COLORURINE YELLOW 05/10/2012 [place] 05/10/2012 1444   LABSPEC 1.009 05/10/2012 1444   PHURINE 7.0 05/10/2012 1444   GLUCOSEU NEGATIVE 05/10/2012 1444   HGBUR NEGATIVE 05/10/2012 1444   BILIRUBINUR NEGATIVE 05/10/2012 1444   KETONESUR NEGATIVE 05/10/2012 1444   PROTEINUR NEGATIVE 05/10/2012 1444   UROBILINOGEN 0.2 05/10/2012 1444   NITRITE NEGATIVE 05/10/2012 1444   LEUKOCYTESUR MODERATE* 05/10/2012 1444    STUDIES: Ct Angio Chest Pe W/cm &/or Wo Cm  07/15/2013   CLINICAL DATA:  Short of breath for 6 weeks. Elevated D-dimer. Bilateral breast cancer diagnosed 3/14.  EXAM: CT ANGIOGRAPHY CHEST WITH CONTRAST  TECHNIQUE: Multidetector CT imaging of the chest was performed using the standard protocol during bolus administration of intravenous contrast. Multiplanar CT image reconstructions and MIPs were obtained to evaluate the vascular anatomy.  CONTRAST:  37m OMNIPAQUE IOHEXOL 350 MG/ML SOLN  COMPARISON:  05/14/2012 chest radiograph. PET 05/12/2012. No prior diagnostic chest CT.  FINDINGS: Lungs/Pleura:  No nodules or airspace opacities.  No pleural fluid.  Heart/Mediastinum: The quality of this examination for evaluation of pulmonary embolism is good. Mild limitations  secondary to beam hardening artifact from breast implant tissue expanders.  No evidence of pulmonary embolism.  A right-sided Port-A-Cath with tip at low SVC.  Left axillary node dissection. No axillary adenopathy. No subpectoral adenopathy. Aortic and branch vessel atherosclerosis. Heart size upper normal, without  pericardial effusion. Coronary artery atherosclerosis, including within the LAD on image 44/series 5  No mediastinal or hilar adenopathy. Mildly dilated esophagus with fluid within.  Upper Abdomen: Normal imaged portions of the liver and spleen. Right hemidiaphragm elevation. Surgical changes about the proximal stomach. Mild left adrenal thickening. Normal imaged portions of the right adrenal gland, kidneys.  Bones/Musculoskeletal: Calcification about the right rotator cuff, suggesting chronic calcific tendinitis.  Review of the MIP images confirms the above findings.  IMPRESSION: 1.  No evidence of pulmonary embolism. 2. Age advanced coronary artery atherosclerosis. Recommend assessment of coronary risk factors and consideration of medical therapy. 3. Esophageal air fluid level suggests dysmotility or gastroesophageal reflux.   Electronically Signed   By: Abigail Miyamoto M.D.   On: 07/15/2013 11:39    ASSESSMENT: 63 y.o. BRCA negative [name] [name] woman status post bilateral mastectomies 05/14/2012  (a) on the left, mpT1c pN0, stage IA invasive ductal carcinoma, grade 2, estrogen receptor 100% positive, progesterone receptor 11% positive, with an MIB-1 of 80%, and HER-2 amplified, the ratio being 6.50.  (b) on the right,pT1c pN0, stage IA invasive ductal carcinoma, grade 3, triple negative, with an MIB-1 of 92%  (1) treated adjuvantly with doxorubicin and cyclophosphamide in dose dense fashion x4, completed 08/13/2012, followed by weekly Abraxane x12 given with concurrent trastuzumab, completed 12/03/2012  (2) receive trastuzumab between 08/27/2012 and 06/29/2013, discontinued because of concerns regarding weakening of the heart muscle  (3) letrozole started November 2014; with fatigue and multiple arthralgias/myalgias, letrozole was held for 6 weeks with no improvement in symptoms, and so it was resumed.   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 There were no mutations in the genetic panel tested, which included ATM, BARD1, BRCA1, BRCA2, BRIP1, CDH1, CHEK2, EPCAM, FANCC, MLH1, MSH2, MSH6, NBN, PALB2, PMS2, PTEN, RAD51C, RAD51D, STK11, TP53, and XRCC2   PLAN: We spent approximately 45 minutes going over [name] [name]'s situation. Given the concerns regarding weakening of the heart muscle, even though her ejection fraction remains good, I think we are done with trastuzumab and therefore she may have her port removed. This will allow her to complete her implant reconstruction under Dr. Harlow Mares.  We discussed the different options and antiestrogen therapy, but if she was off the letrozole for 6 weeks with absolutely no improvement in symptoms, then the letrozole is not the culprit. Instead I encouraged her to go back to her exercise program, with a lot of warm up some stretching as well as weight-bearing. If she is very persistent and as she may be able to regain her formerly excellent functional status.  She is already on calcium and vitamin D and understands the need for weight-bearing exercise given the concern regarding bone density loss with letrozole.  At this point, then, the plan from our point of view is to continue letrozole for 5 years. She will see me again in 3 months, and we will likely see her every 3 months for the next year, then every 6 months thereafter. She will not need mammography, breast MRI, or other breast imaging in the absence of specific symptoms or suspicious findings by exam.  [name] has a good understanding of the overall plan. She agrees with it. She knows the goal of treatment in her case is cure. She will call with any problems that may develop before her next visit here.  Chauncey Cruel, MD   07/25/2013 9:21 AM

## 2013-07-26 ENCOUNTER — Other Ambulatory Visit: Payer: Self-pay | Admitting: *Deleted

## 2013-07-27 ENCOUNTER — Ambulatory Visit (HOSPITAL_COMMUNITY): Payer: BC Managed Care – PPO | Attending: Cardiology | Admitting: Radiology

## 2013-07-27 VITALS — BP 162/80 | Ht 64.0 in | Wt 190.0 lb

## 2013-07-27 DIAGNOSIS — E119 Type 2 diabetes mellitus without complications: Secondary | ICD-10-CM | POA: Insufficient documentation

## 2013-07-27 DIAGNOSIS — I2510 Atherosclerotic heart disease of native coronary artery without angina pectoris: Secondary | ICD-10-CM

## 2013-07-27 DIAGNOSIS — R0602 Shortness of breath: Secondary | ICD-10-CM

## 2013-07-27 DIAGNOSIS — Z87891 Personal history of nicotine dependence: Secondary | ICD-10-CM | POA: Insufficient documentation

## 2013-07-27 DIAGNOSIS — R079 Chest pain, unspecified: Secondary | ICD-10-CM

## 2013-07-27 DIAGNOSIS — Z8249 Family history of ischemic heart disease and other diseases of the circulatory system: Secondary | ICD-10-CM | POA: Insufficient documentation

## 2013-07-27 MED ORDER — TECHNETIUM TC 99M SESTAMIBI GENERIC - CARDIOLITE
10.0000 | Freq: Once | INTRAVENOUS | Status: AC | PRN
  Administered 2013-07-27: 10 via INTRAVENOUS

## 2013-07-27 MED ORDER — REGADENOSON 0.4 MG/5ML IV SOLN
0.4000 mg | Freq: Once | INTRAVENOUS | Status: AC
  Administered 2013-07-27: 0.4 mg via INTRAVENOUS

## 2013-07-27 MED ORDER — TECHNETIUM TC 99M SESTAMIBI GENERIC - CARDIOLITE
30.0000 | Freq: Once | INTRAVENOUS | Status: AC | PRN
  Administered 2013-07-27: 30 via INTRAVENOUS

## 2013-07-27 NOTE — Progress Notes (Signed)
[facility] [address] [place], [place] [address] [phone]    Cardiology Nuclear Med Study  [name] [name] is a 63 y.o. female     MRN : [id number]     DOB: 04-Jul-1950  Procedure Date: 07/27/2013  Nuclear Med Background Indication for Stress Test:  Evaluation for Ischemia History:No Known History of CAD;Previous Nuclear Study 10 yrs ago;Repeat Echo 08/04/13 EF 55-60%; Cardiac Risk Factors: Family History - CAD, History of Smoking, Lipids and NIDDM  Symptoms:  Chest Pain   Nuclear Pre-Procedure Caffeine/Decaff Intake:  None> 12 hrs NPO After: 7:00am   Lungs:  clear O2 Sat: 97% on room air. IV 0.9% NS with Angio Cath:  22g  IV Site: R Antecubital x 1, tolerated well IV Started by:  Irven Baltimore, RN  Chest Size (in):  36 Cup Size: C, Bilateral Expanders  Height: 5\' 4"  (1.626 m)  Weight:  190 lb (86.183 kg)  BMI:  Body mass index is 32.6 kg/(m^2). Tech Comments:  Patient took [place] with breakfast, and held Coreg this am. Irven Baltimore, RN.    Nuclear Med Study 1 or 2 day study: 1 day  Stress Test Type:  Lexiscan  Reading MD: N/A  Order Authorizing Provider:  Olen Pel MD  Resting Radionuclide: Technetium 56m Sestamibi  Resting Radionuclide Dose: 11.0 mCi   Stress Radionuclide:  Technetium 62m Sestamibi  Stress Radionuclide Dose: 33.0  mCi          Stress Protocol Rest HR: 79 Stress HR: 133  Rest BP: 162/80 Stress BP: 167/74  Exercise Time (min): n/a METS: n/a   Predicted Max HR: 157 bpm % Max HR: 84.71 bpm Rate Pressure Product: 22211   Dose of Adenosine (mg):  n/a Dose of Lexiscan: 0.4 mg  Dose of Atropine (mg): n/a Dose of Dobutamine: n/a mcg/kg/min (at max HR)  Stress Test Technologist: Ileene Hutchinson, EMT-P  Nuclear Technologist:  Charlton Amor, CNMT     Rest Procedure:  Myocardial perfusion imaging was performed at rest 45 minutes following the intravenous administration of Technetium 19m Sestamibi. Rest ECG: NSR  with non-specific ST-T wave changes  Stress Procedure:  The patient received IV Lexiscan 0.4 mg over 15-seconds.  Technetium 59m Sestamibi injected at 30-seconds.  Quantitative spect images were obtained after a 45 minute delay. Stress ECG: No significant change from baseline ECG  QPS Raw Data Images:  Mild breast attenuation.  Normal left ventricular size. Stress Images:  Apical attenuation Rest Images:  Apical attenuation Subtraction (SDS):  No evidence of ischemia. Transient Ischemic Dilatation (Normal <1.22):  1.01 Lung/Heart Ratio (Normal <0.45):  0.41  Quantitative Gated Spect Images QGS EDV:  113 ml QGS ESV:  52 ml  Impression Exercise Capacity:  Lexiscan with low level exercise. BP Response:  Normal blood pressure response. Clinical Symptoms:  Tired ECG Impression:  No significant ST segment change suggestive of ischemia. Comparison with Prior Nuclear Study: No images to compare  Overall Impression:  Low risk stress nuclear study Mild attenuation of the distal anterior wall and apex thought due to breast attenuation No ischemia.  LV Ejection Fraction: 54%.  LV Wall Motion:  NL LV Function; NL Wall Motion   Jenkins Rouge

## 2013-07-29 ENCOUNTER — Encounter: Payer: Self-pay | Admitting: Gastroenterology

## 2013-07-29 ENCOUNTER — Ambulatory Visit (INDEPENDENT_AMBULATORY_CARE_PROVIDER_SITE_OTHER): Payer: BC Managed Care – PPO | Admitting: Gastroenterology

## 2013-07-29 VITALS — BP 132/66 | HR 68 | Ht 64.0 in | Wt 192.4 lb

## 2013-07-29 DIAGNOSIS — R109 Unspecified abdominal pain: Secondary | ICD-10-CM

## 2013-07-29 DIAGNOSIS — R112 Nausea with vomiting, unspecified: Secondary | ICD-10-CM

## 2013-07-29 NOTE — Progress Notes (Signed)
HPI: This is a   very pleasant 63 year old woman whom I am meeting for the first time today.  Gastric bypass surgery 2010 Cammie Sickle at [facility] roux -en y :: lost 80-90 pounds. February 2012; Emergency surgery for Exploratory laparotomy with closure and omental patch of perforated marginal ulcer with placement of a 22-French feeding gastrostomy tube as well as upper endoscopy by Dr. Alphonsa Overall. This was all done on April 16, 2010.  Feeding tube in pace for 3 months.  She had required 3-4 EGD diltaions of GJ anastomosis that was causing vomiting.  She continues to have problems with vomiting, food not going down; same symptoms as prior to her perforation.  She takes protonix. Takes this 1-2 times per day.  Lately off it for a month, notices difference.  Has gained weight since chemo with steroids: 40-50 pounds in past year.  Vomiting is intermittent. 5-8 times per month.  After she eats, she will usually be uncomfortable and most of those times she will vomit much of what she just ate.   Review of systems: Pertinent positive and negative review of systems were noted in the above HPI section. Complete review of systems was performed and was otherwise normal.    Past Medical History  Diagnosis Date  . Heart murmur   . Diabetes mellitus without complication   . Osteopenia   . Cancer     breast  . Bronchitis   . Pneumonia   . Hiatal hernia   . Sinus problem   . Diabetes mellitus   . Breast cancer 04/15/12    left-biopsy  . Breast cancer, right breast 05/14/12    right mastectomy  . CHF with unknown LVEF 03/09/2013    Past Surgical History  Procedure Laterality Date  . Cesarean section  1981, 1983  . Tummy tuck  1999  . Tonsillectomy    . Foot surgery    . Gastric bypass    . Wound debridement  01/14/2011    Procedure: DEBRIDEMENT ABDOMINAL WOUND;  Surgeon: Adin Hector, MD;  Location: [place];  Service: General;  Laterality: N/A;  wound  debridement and debridement on the abdomen  . Cosmetic surgery    . Total mastectomy Bilateral 05/14/2012    Procedure: TOTAL MASTECTOMY;  Surgeon: Adin Hector, MD;  Location: [place];  Service: General;  Laterality: Bilateral;  . Axillary sentinel node biopsy Left 05/14/2012    Procedure: AXILLARY SENTINEL NODE BIOPSY;  Surgeon: Adin Hector, MD;  Location: [place];  Service: General;  Laterality: Left;  . Portacath placement N/A 05/14/2012    Procedure: INSERTION PORT-A-CATH;  Surgeon: Adin Hector, MD;  Location: [place];  Service: General;  Laterality: N/A;  . Breast reconstruction with placement of tissue expander and flex hd (acellular hydrated dermis) Bilateral 05/14/2012    Procedure: BREAST RECONSTRUCTION WITH PLACEMENT OF TISSUE EXPANDER AND FLEX HD (ACELLULAR HYDRATED DERMIS);  Surgeon: Crissie Reese, MD;  Location: [place];  Service: Plastics;  Laterality: Bilateral;  . Bunionectomy      Current Outpatient Prescriptions  Medication Sig Dispense Refill  . Ascorbic Acid (VITAMIN C) 500 MG tablet Take 500 mg by mouth daily.        . Biotin 10 MG TABS Take by mouth. 4000 mcg daily      . Calcium Citrate (CITRACAL PO) Take by mouth.      . carvedilol (COREG) 3.125 MG tablet Take 1 tablet (3.125 mg total) by mouth 2 (two)  times daily with a meal.  60 tablet  3  . Cholecalciferol (VITAMIN D PO) Take 1 tablet by mouth daily.      Mariane Baumgarten Sodium (COLACE PO) Take by mouth.      Marland Kitchen KRILL OIL PO Take by mouth.      . letrozole (FEMARA) 2.5 MG tablet Take 1 tablet (2.5 mg total) by mouth daily.  90 tablet  12  . lidocaine-prilocaine (EMLA) cream Apply topically as needed.  30 g  6  . Multiple Vitamin (MULTIVITAMIN PO) Take 1 tablet by mouth daily.       . Probiotic Product (PROBIOTIC DAILY PO) Take by mouth daily.      . rosuvastatin (CRESTOR) 5 MG tablet Take 5 mg by mouth every Monday, Wednesday, and Friday.      . sitaGLIPtin (JANUVIA) 100 MG tablet Take 100 mg by mouth daily.      Marland Kitchen  UNABLE TO FIND Apply 1 Device topically once. Cranial prosthesis r/t chemotherapy treatment  1 Device  0   No current facility-administered medications for this visit.    Allergies as of 07/29/2013 - Review Complete 07/29/2013  Allergen Reaction Noted  . Darvon Hives and Itching 06/24/2010  . Penicillins Hives and Itching 06/24/2010    Family History  Problem Relation Age of Onset  . Heart attack Father   . Diabetes Father   . Heart attack Brother   . Diabetes Brother   . Diabetes Sister   . Breast cancer Sister 73  . Diabetes Mother   . Breast cancer Mother 83  . Parkinson's disease Sister   . Heart attack Brother     History   Social History  . Marital Status: Married    Spouse Name: Joe    Number of Children: 2  . Years of Education: 18   Occupational History  . Glass blower/designer    Social History Main Topics  . Smoking status: Former Research scientist (life sciences)  . Smokeless tobacco: Never Used  . Alcohol Use: 1.2 - 1.8 oz/week    2-3 Glasses of wine per week  . Drug Use: No  . Sexual Activity: Yes    Partners: Male    Birth Control/ Protection: Post-menopausal   Other Topics Concern  . Not on file   Social History Narrative  . No narrative on file       Physical Exam: BP 132/66  Pulse 68  Ht 5\' 4"  (1.626 m)  Wt 192 lb 6.4 oz (87.272 kg)  BMI 33.01 kg/m2 Constitutional: generally well-appearing Psychiatric: alert and oriented x3 Eyes: extraocular movements intact Mouth: oral pharynx moist, no lesions Neck: supple no lymphadenopathy Cardiovascular: heart regular rate and rhythm Lungs: clear to auscultation bilaterally Abdomen: soft, nontender, nondistended, no obvious ascites, no peritoneal signs, normal bowel sounds Extremities: no lower extremity edema bilaterally Skin: no lesions on visible extremities    Assessment and plan: 63 y.o. female with  intermittent vomiting, complex gastric history  I do think she is having trouble with her gastro-jejunostomy  anastomosis as she had prior to her marginal ulcer perforation. The Roux-en-Y gastric bypass that she had 2010 was followed by 3-4 EGD dilations of her anastomosis for vomiting periodically. She developed an ulcer at the site which also perforated. Since that emergency surgery she has continued to be bothered by the same intermittent vomiting episodes. 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 Repeating dilation is certainly a possible course here. The fact that she had  ulcerated at the anastomosis makes that more risky and she and her husband understand. A longer-term solution may be revision of her bypass. For now I am going to have her get an upper GI barium examination to best understand her anatomy, outside is this anastomosis? After having that information I will communicate with her surgeon who she puts a large amount of face into for good reason. He will discuss endoscopic dilation versus revision of her bypass versus clinical observation versus other.

## 2013-07-29 NOTE — Patient Instructions (Addendum)
UGI: to define anatomy, how tight is the gastro-jejunostomy stricture? I was unable to schedule appt radiology was not answering I will call later and schedule and call the pt back Will communicate with Dr. Dalbert Batman.

## 2013-08-02 ENCOUNTER — Ambulatory Visit (HOSPITAL_COMMUNITY)
Admission: RE | Admit: 2013-08-02 | Discharge: 2013-08-02 | Disposition: A | Discharge status: Home / Self Care | Payer: BC Managed Care – PPO | Source: Ambulatory Visit | Attending: Gastroenterology | Admitting: Gastroenterology

## 2013-08-02 DIAGNOSIS — R1013 Epigastric pain: Secondary | ICD-10-CM

## 2013-08-02 DIAGNOSIS — Z9884 Bariatric surgery status: Secondary | ICD-10-CM | POA: Insufficient documentation

## 2013-08-02 DIAGNOSIS — R109 Unspecified abdominal pain: Secondary | ICD-10-CM

## 2013-08-02 DIAGNOSIS — K3189 Other diseases of stomach and duodenum: Secondary | ICD-10-CM | POA: Insufficient documentation

## 2013-08-02 DIAGNOSIS — R112 Nausea with vomiting, unspecified: Secondary | ICD-10-CM | POA: Insufficient documentation

## 2013-08-03 ENCOUNTER — Telehealth: Payer: Self-pay

## 2013-08-03 NOTE — Telephone Encounter (Signed)
Message copied by Lamar Laundry on Wed Aug 03, 2013  3:51 PM ------      Message from: Daneen Schick      Created: Wed Aug 03, 2013  3:09 PM       No blood flow abnormality noted on Nuclear stress. EF 54% ------

## 2013-08-03 NOTE — Telephone Encounter (Signed)
called to give pt myoview results.lmtcb on all phone numbers listed for pt

## 2013-08-04 NOTE — Telephone Encounter (Signed)
Message copied by Lamar Laundry on Thu Aug 04, 2013  9:48 AM ------      Message from: Daneen Schick      Created: Wed Aug 03, 2013  3:09 PM       No blood flow abnormality noted on Nuclear stress. EF 54% ------

## 2013-08-04 NOTE — Telephone Encounter (Signed)
pt given myoview results.No blood flow abnormality noted on Nuclear stress. EF 54%.pt verbalized understanding.

## 2013-08-10 ENCOUNTER — Ambulatory Visit: Payer: BC Managed Care – PPO | Admitting: Oncology

## 2013-08-10 ENCOUNTER — Other Ambulatory Visit: Payer: BC Managed Care – PPO

## 2013-08-10 ENCOUNTER — Ambulatory Visit: Payer: BC Managed Care – PPO

## 2013-08-16 ENCOUNTER — Encounter (INDEPENDENT_AMBULATORY_CARE_PROVIDER_SITE_OTHER): Payer: BC Managed Care – PPO | Admitting: General Surgery

## 2013-08-16 ENCOUNTER — Ambulatory Visit (INDEPENDENT_AMBULATORY_CARE_PROVIDER_SITE_OTHER): Payer: BC Managed Care – PPO | Admitting: General Surgery

## 2013-08-17 ENCOUNTER — Ambulatory Visit: Payer: BC Managed Care – PPO

## 2013-08-17 ENCOUNTER — Ambulatory Visit (INDEPENDENT_AMBULATORY_CARE_PROVIDER_SITE_OTHER): Payer: BC Managed Care – PPO | Admitting: General Surgery

## 2013-08-17 ENCOUNTER — Encounter (INDEPENDENT_AMBULATORY_CARE_PROVIDER_SITE_OTHER): Payer: Self-pay | Admitting: General Surgery

## 2013-08-17 ENCOUNTER — Ambulatory Visit: Payer: BC Managed Care – PPO | Admitting: Adult Health

## 2013-08-17 ENCOUNTER — Other Ambulatory Visit: Payer: BC Managed Care – PPO

## 2013-08-17 VITALS — BP 118/70 | HR 80 | Temp 98.0°F | Resp 14 | Ht 64.0 in | Wt 191.2 lb

## 2013-08-17 DIAGNOSIS — C50912 Malignant neoplasm of unspecified site of left female breast: Secondary | ICD-10-CM

## 2013-08-17 DIAGNOSIS — C50919 Malignant neoplasm of unspecified site of unspecified female breast: Secondary | ICD-10-CM

## 2013-08-17 DIAGNOSIS — C50911 Malignant neoplasm of unspecified site of right female breast: Secondary | ICD-10-CM

## 2013-08-17 NOTE — Patient Instructions (Signed)
you will be scheduled for Port-A-Cath removal under sedation in the near future at your convenience.  Be sure to keep your appointment with Dr. Jana Hakim in September.

## 2013-08-17 NOTE — Progress Notes (Signed)
Patient ID: [name] G Gilbert, female   DOB: 06/01/1950, 63 y.o.   MRN: [id number]  History:  Ms. Meenan returns to discuss Port-A-Cath removal. She has completed her chemotherapy and is now followed by Dr. Jana Hakim. She states that he examine her breasts and all the regional lymph nodes just 2 weeks ago. She would like to get the Port-A-Cath out so that Dr. Harlow Mares can continue expanding her tissue expanders. Breast cancer history is summarized below. This patient underwent surgery for what turned out to be bilateral breast cancer on 05/14/2012.  Port-A-Cath was inserted, left total mastectomy and sentinel node biopsy performed. Right total mastectomy performed.  Pathology on the left showed multifocal cancers, stage T1c., N0, grade 2, receptor positive, Her-2 +  A cancer was found in the right breast, T1c,., N0, triple negative breast cancer, grade 3. There was one non-sentinel node found.  Dr. Humphrey Rolls did not think that she needs radiation therapy and the appointment with Dr. Pablo Ledger was canceled. She has received adjuvant chemotherapy.Excised surgically she is doing well. Range of motion of shoulders is good. Skin is healthy. She still has her tissue expanders in place but has not had much expansion lately.   Past history, family history, social history are documented on the chart, unchanged, and noncontributory except as described above. ROS:  10 system review of systems is negative except as described above.   Exam:  Patient looks well. Her husband is with her.  Neck reveals no adenopathy or mass  Lungs: Clear to auscultation bilaterally.Port right infraclavicular area looks normal. Incision healed. No fluid.  Assessment:  Left breast cancer, multifocal, receptor positive, T1c,. N0, Her-2 +  Right breast cancer, triple negative, T1c, N0.  Recovered uneventfully following above-described bilateral mastectomy with reconstruction  Desires Port-A-Cath removal  Plan:  We discussed  Port-A-Cath removal. We discussed doing this under local and sedation and she requested this be done under IV sedation. We'll schedule for Port-A-Cath removal under IV sedation and a surgery center at her request. I discussed the indications, details, techniques, and numerous risk of the surgery with her. She and her husband are where the risk of bleeding, infection, other wound problems. She understands all of these issues. All of her questions are answered. She agrees with this plan.      Edsel Petrin. Dalbert Batman, M.D., [facility]  General and Minimally invasive Surgery  Breast and Colorectal Surgery  Office: [phone]  Pager: [phone]

## 2013-08-24 ENCOUNTER — Ambulatory Visit (HOSPITAL_COMMUNITY)
Admission: RE | Admit: 2013-08-24 | Discharge: 2013-08-24 | Disposition: A | Discharge status: Home / Self Care | Payer: BC Managed Care – PPO | Source: Ambulatory Visit | Attending: Internal Medicine | Admitting: Internal Medicine

## 2013-08-24 VITALS — BP 122/62 | HR 75 | Wt 192.5 lb

## 2013-08-24 DIAGNOSIS — R0600 Dyspnea, unspecified: Secondary | ICD-10-CM

## 2013-08-24 DIAGNOSIS — R0989 Other specified symptoms and signs involving the circulatory and respiratory systems: Secondary | ICD-10-CM | POA: Insufficient documentation

## 2013-08-24 DIAGNOSIS — I427 Cardiomyopathy due to drug and external agent: Secondary | ICD-10-CM

## 2013-08-24 DIAGNOSIS — R0609 Other forms of dyspnea: Secondary | ICD-10-CM | POA: Insufficient documentation

## 2013-08-24 DIAGNOSIS — E785 Hyperlipidemia, unspecified: Secondary | ICD-10-CM | POA: Insufficient documentation

## 2013-08-24 DIAGNOSIS — I509 Heart failure, unspecified: Secondary | ICD-10-CM | POA: Insufficient documentation

## 2013-08-24 DIAGNOSIS — M899 Disorder of bone, unspecified: Secondary | ICD-10-CM | POA: Insufficient documentation

## 2013-08-24 DIAGNOSIS — I429 Cardiomyopathy, unspecified: Secondary | ICD-10-CM | POA: Insufficient documentation

## 2013-08-24 DIAGNOSIS — T451X5A Adverse effect of antineoplastic and immunosuppressive drugs, initial encounter: Secondary | ICD-10-CM | POA: Insufficient documentation

## 2013-08-24 DIAGNOSIS — Z885 Allergy status to narcotic agent status: Secondary | ICD-10-CM | POA: Insufficient documentation

## 2013-08-24 DIAGNOSIS — M949 Disorder of cartilage, unspecified: Secondary | ICD-10-CM

## 2013-08-24 DIAGNOSIS — E119 Type 2 diabetes mellitus without complications: Secondary | ICD-10-CM | POA: Insufficient documentation

## 2013-08-24 DIAGNOSIS — Z87891 Personal history of nicotine dependence: Secondary | ICD-10-CM | POA: Insufficient documentation

## 2013-08-24 DIAGNOSIS — Z9010 Acquired absence of unspecified breast and nipple: Secondary | ICD-10-CM | POA: Insufficient documentation

## 2013-08-24 DIAGNOSIS — K219 Gastro-esophageal reflux disease without esophagitis: Secondary | ICD-10-CM | POA: Insufficient documentation

## 2013-08-24 DIAGNOSIS — Z880 Allergy status to penicillin: Secondary | ICD-10-CM | POA: Insufficient documentation

## 2013-08-24 DIAGNOSIS — C50919 Malignant neoplasm of unspecified site of unspecified female breast: Secondary | ICD-10-CM | POA: Insufficient documentation

## 2013-08-24 DIAGNOSIS — Z171 Estrogen receptor negative status [ER-]: Secondary | ICD-10-CM | POA: Insufficient documentation

## 2013-08-24 MED ORDER — ROSUVASTATIN CALCIUM 5 MG PO TABS: 5.0000 mg | ORAL_TABLET | Freq: Every day | ORAL | Status: DC

## 2013-08-24 NOTE — Patient Instructions (Signed)
Increase Crestor to 5 mg daily  We will contact you in 6 months to schedule your next appointment and echocardiogram

## 2013-08-24 NOTE — Progress Notes (Signed)
Patient ID: [name] [name], female   DOB: [birth year], 63 y.o.   MRN: [id number]  PCP: [name] Greenland, MD  GYN: Dr [name] [name]  SU: Dr. Fanny [name]  OTHER MD: Dr. Harlow [name], plastic surgery Cardiologist: Dr [name] [name]    HPI: [name] [name] is a 63 year old with a history of right breast T1 N0, stage IA, invasive ductal carcinoma, grade III, HER-2/neu negative and  L breast cancer  T1 N0, stage IA invasive ductal carcinoma, grade II,HER-2/neu positive.  She is received Adriamycin Cytoxan for a total of 4 cycles. Given 07/02/12 - 08/12/12 followed by Taxol and Herceptin to be given weekly for 12 weeks time beginning 08/27/12.  She stopped Herceptin earlier in May 2015 due to chest pain and neck pain. She was evaluated by Dr [name] [name] in late May with recommendations for CT of chest which was negative for PE but did show advanced coronary artery atherosclerosis.    02/2013 EF 55-60% Lateral S' 8.7 no GLS measured Echo 07/25/13: 60-65% lateral s' 12.5  GLS-18.2  We saw her last month for ongoing dyspnea and ordered repeat echo and Dr. Tamala [name] ordered a Myoview. Myoview 6/15 EF 54% normal perfusion. Also started on low-dose carvedilol. Feels much better. Denies dyspnea. Able to do more exercise. Walking 3 miles on TM up to 71mh with incline. Occasional mild CP - non-exertional. No edema.   FH: Father CAD MI SH: Lives with husband. Former smoker quit 8 years ago. Current Outpatient Prescriptions on File Prior to Encounter  Medication Sig Dispense Refill  . Ascorbic Acid (VITAMIN C) 500 MG tablet Take 500 mg by mouth daily.        . Biotin 10 MG TABS Take by mouth. 4000 mcg daily      . Calcium Citrate (CITRACAL PO) Take by mouth.      . carvedilol (COREG) 3.125 MG tablet Take 1 tablet (3.125 mg total) by mouth 2 (two) times daily with a meal.  60 tablet  3  . Cholecalciferol (VITAMIN D PO) Take 1 tablet by mouth daily.      .Mariane BaumgartenSodium (COLACE PO) Take by mouth.      .Marland KitchenKRILL OIL PO Take by mouth.      .  letrozole (FEMARA) 2.5 MG tablet Take 1 tablet (2.5 mg total) by mouth daily.  90 tablet  12  . lidocaine-prilocaine (EMLA) cream Apply topically as needed.  30 g  6  . Multiple Vitamin (MULTIVITAMIN PO) Take 1 tablet by mouth daily.       . Probiotic Product (PROBIOTIC DAILY PO) Take by mouth daily.      . rosuvastatin (CRESTOR) 5 MG tablet Take 5 mg by mouth every Monday, Wednesday, and Friday.      . sitaGLIPtin (JANUVIA) 100 MG tablet Take 100 mg by mouth daily.      .Marland KitchenUNABLE TO FIND Apply 1 Device topically once. Cranial prosthesis r/t chemotherapy treatment  1 Device  0   No current facility-administered medications on file prior to encounter.   PHYSICAL EXAM: Filed Vitals:   08/24/13 1024  BP: 122/62  Pulse: 75   General:  Well appearing. No respiratory difficulty Husband present  HEENT: normal Neck: supple. no JVD. Carotids 2+ bilat; no bruits. No lymphadenopathy or thryomegaly appreciated. Cor: PMI nondisplaced. Regular rate & rhythm. No rubs, gallops or murmurs. Lungs: clear Abdomen: soft, nontender, nondistended. No hepatosplenomegaly. No bruits or masses. Good bowel sounds. Extremities: no cyanosis, clubbing, rash, edema Neuro: alert & oriented x  3, cranial nerves grossly intact. moves all 4 extremities w/o difficulty. Affect pleasant.    No results found for this or any previous visit (from the past 24 hour(s)). No results found.   ASSESSMENT & PLAN:  1. R/L Breast Cancer - Right breast T1 N0, stage IA, invasive ductal carcinoma, grade III, HER-2/neu negative and  L breast cancer  T1 N0, stage IA invasive ductal carcinoma, grade II,HER-2/neu positive.She is received Adriamycin Cytoxan for a total of 4 cycles. Given 07/02/12 - 08/12/12 followed by Taxol and Herceptin. She has received all but 3 rounds of Herceptin to complete 1 year therapy  2. Chemotherapy-induced cardiomyopathy - I do believe that she had mild chemo-induced cardiotoxicity with the combination of  adriamycin and Herceptin. This has now resolved. Echos reviewed personally and LV function now vigorous again. Given that she has only 3 Herceptin treatments left, I do agree with Dr. Jana Hakim that it is reasonable to suspend these. We will continue carvedilol for one more month and then can stop. I will repeat echo in 6 months to ensure stability of LV function.   3. CP/exertional dyspnea - Much improved. Stress test normal. Continue exercise and RF modification. Given coronary calcifications on CT scan would continue asa and Crestor. She will increase Crestor to daily.    [name] MD 10:45 AM

## 2013-09-06 ENCOUNTER — Encounter (HOSPITAL_BASED_OUTPATIENT_CLINIC_OR_DEPARTMENT_OTHER): Payer: Self-pay | Admitting: *Deleted

## 2013-09-06 NOTE — Progress Notes (Signed)
Patient states she is on Coreg for chemo induced cardiomyopathy, not BP. States she had a normal ECHO in June and Dr Zoila Shutter is taking her off of the Coreg at the end of the month July. Her last labs were the end of May, did not bring her in to repeat.

## 2013-09-07 ENCOUNTER — Other Ambulatory Visit: Payer: BC Managed Care – PPO

## 2013-09-07 ENCOUNTER — Ambulatory Visit: Payer: BC Managed Care – PPO

## 2013-09-08 NOTE — H&P (Signed)
  History:  Ms. Notch returns to discuss Port-A-Cath removal. She has completed her chemotherapy and is now followed by Dr. Jana Hakim. She states that he examine her breasts and all the regional lymph nodes just 2 weeks ago. She would like to get the Port-A-Cath out so that Dr. Harlow Mares can continue expanding her tissue expanders.  Breast cancer history is summarized below.  This patient underwent surgery for what turned out to be bilateral breast cancer on 05/14/2012.  Port-A-Cath was inserted, left total mastectomy and sentinel node biopsy performed. Right total mastectomy performed.  Pathology on the left showed multifocal cancers, stage T1c., N0, grade 2, receptor positive, Her-2 +  A cancer was found in the right breast, T1c,., N0, triple negative breast cancer, grade 3. There was one non-sentinel node found.  Dr. Humphrey Rolls did not think that she needs radiation therapy and the appointment with Dr. Pablo Ledger was canceled. She has received adjuvant chemotherapy. Range of motion of shoulders is good. Skin is healthy. She still has her tissue expanders in place but has not had much expansion lately.   Past history, family history, social history are documented on the chart, unchanged, and noncontributory except as described above.   ROS:  10 system review of systems is negative except as described above.   Exam:  Patient looks well. Her husband is with her.  Neck reveals no adenopathy or mass  Lungs: Clear to auscultation bilaterally Port right infraclavicular area looks normal. Incision healed. No fluid.   Assessment:  Left breast cancer, multifocal, receptor positive, T1c,. N0, Her-2 +  Right breast cancer, triple negative, T1c, N0.  Recovered uneventfully following above-described bilateral mastectomy with reconstruction  Desires Port-A-Cath removal   Plan:  We discussed Port-A-Cath removal. We discussed doing this under local and sedation and she requested this be done under IV sedation.   We'll schedule for Port-A-Cath removal under IV sedation at a surgery center at her request.  I discussed the indications, details, techniques, and numerous risk of the surgery with her. She and her husband are where the risk of bleeding, infection, other wound problems. She understands all of these issues. All of her questions are answered. She agrees with this plan.     Edsel Petrin. Dalbert Batman, M.D., [facility]  General and Minimally invasive Surgery  Breast and Colorectal Surgery  Office: [phone]

## 2013-09-12 ENCOUNTER — Encounter (HOSPITAL_BASED_OUTPATIENT_CLINIC_OR_DEPARTMENT_OTHER): Payer: Self-pay | Admitting: Anesthesiology

## 2013-09-12 ENCOUNTER — Ambulatory Visit (HOSPITAL_BASED_OUTPATIENT_CLINIC_OR_DEPARTMENT_OTHER)
Admission: RE | Admit: 2013-09-12 | Discharge: 2013-09-12 | Disposition: A | Discharge status: Home / Self Care | Payer: BC Managed Care – PPO | Source: Ambulatory Visit | Attending: General Surgery | Admitting: General Surgery

## 2013-09-12 ENCOUNTER — Encounter (HOSPITAL_BASED_OUTPATIENT_CLINIC_OR_DEPARTMENT_OTHER)
Admission: RE | Disposition: A | Discharge status: Home / Self Care | Payer: Self-pay | Source: Ambulatory Visit | Attending: General Surgery

## 2013-09-12 ENCOUNTER — Ambulatory Visit (HOSPITAL_BASED_OUTPATIENT_CLINIC_OR_DEPARTMENT_OTHER): Payer: BC Managed Care – PPO | Admitting: Anesthesiology

## 2013-09-12 ENCOUNTER — Encounter (HOSPITAL_BASED_OUTPATIENT_CLINIC_OR_DEPARTMENT_OTHER): Payer: BC Managed Care – PPO | Admitting: Anesthesiology

## 2013-09-12 DIAGNOSIS — Z79899 Other long term (current) drug therapy: Secondary | ICD-10-CM | POA: Insufficient documentation

## 2013-09-12 DIAGNOSIS — C50911 Malignant neoplasm of unspecified site of right female breast: Secondary | ICD-10-CM | POA: Diagnosis present

## 2013-09-12 DIAGNOSIS — Z452 Encounter for adjustment and management of vascular access device: Secondary | ICD-10-CM | POA: Insufficient documentation

## 2013-09-12 DIAGNOSIS — Z170 Estrogen receptor positive status [ER+]: Secondary | ICD-10-CM | POA: Insufficient documentation

## 2013-09-12 DIAGNOSIS — Z87891 Personal history of nicotine dependence: Secondary | ICD-10-CM | POA: Insufficient documentation

## 2013-09-12 DIAGNOSIS — E119 Type 2 diabetes mellitus without complications: Secondary | ICD-10-CM | POA: Insufficient documentation

## 2013-09-12 DIAGNOSIS — Z9889 Other specified postprocedural states: Secondary | ICD-10-CM | POA: Insufficient documentation

## 2013-09-12 DIAGNOSIS — C50912 Malignant neoplasm of unspecified site of left female breast: Secondary | ICD-10-CM | POA: Diagnosis present

## 2013-09-12 DIAGNOSIS — Z853 Personal history of malignant neoplasm of breast: Secondary | ICD-10-CM | POA: Insufficient documentation

## 2013-09-12 DIAGNOSIS — K449 Diaphragmatic hernia without obstruction or gangrene: Secondary | ICD-10-CM | POA: Insufficient documentation

## 2013-09-12 DIAGNOSIS — I509 Heart failure, unspecified: Secondary | ICD-10-CM | POA: Insufficient documentation

## 2013-09-12 DIAGNOSIS — I359 Nonrheumatic aortic valve disorder, unspecified: Secondary | ICD-10-CM | POA: Insufficient documentation

## 2013-09-12 DIAGNOSIS — Z9221 Personal history of antineoplastic chemotherapy: Secondary | ICD-10-CM | POA: Insufficient documentation

## 2013-09-12 DIAGNOSIS — K219 Gastro-esophageal reflux disease without esophagitis: Secondary | ICD-10-CM | POA: Insufficient documentation

## 2013-09-12 HISTORY — PX: PORT-A-CATH REMOVAL: SHX5289

## 2013-09-12 HISTORY — DX: Cardiomyopathy due to drug and external agent: I42.7

## 2013-09-12 HISTORY — DX: Cardiomyopathy due to drug and external agent: T45.1X5A

## 2013-09-12 LAB — POCT HEMOGLOBIN-HEMACUE: Hemoglobin: 10.8 g/dL — ABNORMAL LOW (ref 12.0–15.0)

## 2013-09-12 LAB — GLUCOSE, CAPILLARY
Glucose-Capillary: 136 mg/dL — ABNORMAL HIGH (ref 70–99)
Glucose-Capillary: 124 mg/dL — ABNORMAL HIGH (ref 70–99)

## 2013-09-12 SURGERY — REMOVAL PORT-A-CATH
Anesthesia: Monitor Anesthesia Care | Site: Chest | Laterality: Right

## 2013-09-12 MED ORDER — PROPOFOL 10 MG/ML IV BOLUS
INTRAVENOUS | Status: DC | PRN
  Administered 2013-09-12 (×4): 20 ug via INTRAVENOUS

## 2013-09-12 MED ORDER — MIDAZOLAM HCL 2 MG/2ML IJ SOLN
INTRAMUSCULAR | Status: AC
  Filled 2013-09-12: qty 2

## 2013-09-12 MED ORDER — LIDOCAINE 1 % OPTIME INJ - NO CHARGE
INTRAMUSCULAR | Status: DC | PRN
  Administered 2013-09-12: 8 mL

## 2013-09-12 MED ORDER — OXYCODONE HCL 5 MG PO TABS
ORAL_TABLET | ORAL | Status: AC
  Filled 2013-09-12: qty 1

## 2013-09-12 MED ORDER — HYDROMORPHONE HCL PF 1 MG/ML IJ SOLN
INTRAMUSCULAR | Status: AC
  Filled 2013-09-12: qty 1

## 2013-09-12 MED ORDER — OXYCODONE HCL 5 MG/5ML PO SOLN: 5.0000 mg | Freq: Once | ORAL | Status: AC | PRN

## 2013-09-12 MED ORDER — SODIUM BICARBONATE 4 % IV SOLN
INTRAVENOUS | Status: AC
  Filled 2013-09-12: qty 5

## 2013-09-12 MED ORDER — MIDAZOLAM HCL 2 MG/2ML IJ SOLN: 1.0000 mg | INTRAMUSCULAR | Status: DC | PRN

## 2013-09-12 MED ORDER — LIDOCAINE HCL (CARDIAC) 20 MG/ML IV SOLN
INTRAVENOUS | Status: DC | PRN
  Administered 2013-09-12: 50 mg via INTRAVENOUS

## 2013-09-12 MED ORDER — HYDROCODONE-ACETAMINOPHEN 5-325 MG PO TABS: 1.0000 | ORAL_TABLET | Freq: Four times a day (QID) | ORAL | Status: DC | PRN

## 2013-09-12 MED ORDER — HYDROMORPHONE HCL PF 1 MG/ML IJ SOLN
0.2500 mg | INTRAMUSCULAR | Status: DC | PRN
  Administered 2013-09-12: 0.5 mg via INTRAVENOUS

## 2013-09-12 MED ORDER — LACTATED RINGERS IV SOLN
INTRAVENOUS | Status: DC
  Administered 2013-09-12: 09:00:00 via INTRAVENOUS

## 2013-09-12 MED ORDER — SODIUM BICARBONATE 4 % IV SOLN
INTRAVENOUS | Status: DC | PRN
  Administered 2013-09-12: 8 mL via INTRAVENOUS

## 2013-09-12 MED ORDER — FENTANYL CITRATE 0.05 MG/ML IJ SOLN
INTRAMUSCULAR | Status: AC
  Filled 2013-09-12: qty 4

## 2013-09-12 MED ORDER — MIDAZOLAM HCL 5 MG/5ML IJ SOLN
INTRAMUSCULAR | Status: DC | PRN
  Administered 2013-09-12: 2 mg via INTRAVENOUS

## 2013-09-12 MED ORDER — ONDANSETRON HCL 4 MG/2ML IJ SOLN
INTRAMUSCULAR | Status: DC | PRN
  Administered 2013-09-12: 4 mg via INTRAVENOUS

## 2013-09-12 MED ORDER — CHLORHEXIDINE GLUCONATE 4 % EX LIQD: 1.0000 "application " | Freq: Once | CUTANEOUS | Status: DC

## 2013-09-12 MED ORDER — OXYCODONE HCL 5 MG PO TABS
5.0000 mg | ORAL_TABLET | Freq: Once | ORAL | Status: AC | PRN
  Administered 2013-09-12: 5 mg via ORAL

## 2013-09-12 MED ORDER — FENTANYL CITRATE 0.05 MG/ML IJ SOLN
INTRAMUSCULAR | Status: DC | PRN
  Administered 2013-09-12 (×2): 50 ug via INTRAVENOUS

## 2013-09-12 MED ORDER — FENTANYL CITRATE 0.05 MG/ML IJ SOLN: 50.0000 ug | INTRAMUSCULAR | Status: DC | PRN

## 2013-09-12 SURGICAL SUPPLY — 37 items
ADH SKN CLS APL DERMABOND .7 (GAUZE/BANDAGES/DRESSINGS) ×1
APL SKNCLS STERI-STRIP NONHPOA (GAUZE/BANDAGES/DRESSINGS)
BENZOIN TINCTURE PRP APPL 2/3 (GAUZE/BANDAGES/DRESSINGS) IMPLANT
BLADE HEX COATED 2.75 (ELECTRODE) ×2 IMPLANT
BLADE SURG 15 STRL LF DISP TIS (BLADE) ×2 IMPLANT
BLADE SURG 15 STRL SS (BLADE) ×4
CANISTER SUCT 1200ML W/VALVE (MISCELLANEOUS) IMPLANT
CHLORAPREP W/TINT 26ML (MISCELLANEOUS) ×2 IMPLANT
COVER MAYO STAND STRL (DRAPES) ×2 IMPLANT
COVER TABLE BACK 60X90 (DRAPES) ×2 IMPLANT
DECANTER SPIKE VIAL GLASS SM (MISCELLANEOUS) ×2 IMPLANT
DERMABOND ADVANCED (GAUZE/BANDAGES/DRESSINGS) ×1
DERMABOND ADVANCED .7 DNX12 (GAUZE/BANDAGES/DRESSINGS) IMPLANT
DRAPE PED LAPAROTOMY (DRAPES) ×2 IMPLANT
DRAPE UTILITY XL STRL (DRAPES) ×2 IMPLANT
DRSG TEGADERM 4X4.75 (GAUZE/BANDAGES/DRESSINGS) IMPLANT
ELECT REM PT RETURN 9FT ADLT (ELECTROSURGICAL) ×2
ELECTRODE REM PT RTRN 9FT ADLT (ELECTROSURGICAL) ×1 IMPLANT
GLOVE EUDERMIC 7 POWDERFREE (GLOVE) ×2 IMPLANT
GOWN STRL REUS W/ TWL LRG LVL3 (GOWN DISPOSABLE) ×1 IMPLANT
GOWN STRL REUS W/ TWL XL LVL3 (GOWN DISPOSABLE) ×1 IMPLANT
GOWN STRL REUS W/TWL LRG LVL3 (GOWN DISPOSABLE) ×2
GOWN STRL REUS W/TWL XL LVL3 (GOWN DISPOSABLE) ×2
NDL HYPO 25X1 1.5 SAFETY (NEEDLE) ×1 IMPLANT
NEEDLE HYPO 25X1 1.5 SAFETY (NEEDLE) ×2 IMPLANT
PACK BASIN DAY SURGERY FS (CUSTOM PROCEDURE TRAY) ×2 IMPLANT
PENCIL BUTTON HOLSTER BLD 10FT (ELECTRODE) ×2 IMPLANT
SLEEVE SCD COMPRESS KNEE MED (MISCELLANEOUS) IMPLANT
SPONGE GAUZE 4X4 12PLY STER LF (GAUZE/BANDAGES/DRESSINGS) IMPLANT
STRIP CLOSURE SKIN 1/2X4 (GAUZE/BANDAGES/DRESSINGS) IMPLANT
SUT MNCRL AB 4-0 PS2 18 (SUTURE) ×2 IMPLANT
SUT VICRYL 3-0 CR8 SH (SUTURE) ×2 IMPLANT
SYRINGE 12CC LL (MISCELLANEOUS) ×2 IMPLANT
TOWEL OR 17X24 6PK STRL BLUE (TOWEL DISPOSABLE) ×4 IMPLANT
TOWEL OR NON WOVEN STRL DISP B (DISPOSABLE) ×2 IMPLANT
TUBE CONNECTING 20X1/4 (TUBING) IMPLANT
YANKAUER SUCT BULB TIP NO VENT (SUCTIONS) IMPLANT

## 2013-09-12 NOTE — Anesthesia Preprocedure Evaluation (Addendum)
Anesthesia Evaluation  Patient identified by MRN, date of birth, ID band Patient awake    Reviewed: Allergy & Precautions, H&P , NPO status , Patient's Chart, lab work & pertinent test results, reviewed documented beta blocker date and time   Airway Mallampati: II TM Distance: >3 FB Neck ROM: Full    Dental no notable dental hx. (+) Teeth Intact, Dental Advisory Given   Pulmonary neg pulmonary ROS, shortness of breath, former smoker,  breath sounds clear to auscultation  Pulmonary exam normal       Cardiovascular +CHF + Valvular Problems/Murmurs Rhythm:Regular Rate:Normal     Neuro/Psych negative neurological ROS  negative psych ROS   GI/Hepatic Neg liver ROS, hiatal hernia, GERD-  Medicated,  Endo/Other  diabetes, Type 2, Oral Hypoglycemic Agents  Renal/GU negative Renal ROS  negative genitourinary   Musculoskeletal   Abdominal   Peds  Hematology negative hematology ROS (+)   Anesthesia Other Findings   Reproductive/Obstetrics negative OB ROS                          Anesthesia Physical Anesthesia Plan  ASA: III  Anesthesia Plan: MAC   Post-op Pain Management:    Induction: Intravenous  Airway Management Planned: Simple Face Mask  Additional Equipment:   Intra-op Plan:   Post-operative Plan:   Informed Consent: I have reviewed the patients History and Physical, chart, labs and discussed the procedure including the risks, benefits and alternatives for the proposed anesthesia with the patient or authorized representative who has indicated his/her understanding and acceptance.   Dental advisory given  Plan Discussed with: CRNA and Surgeon  Anesthesia Plan Comments:        Anesthesia Quick Evaluation

## 2013-09-12 NOTE — Anesthesia Postprocedure Evaluation (Signed)
  Anesthesia Post-op Note  Patient: [name] [name]  Procedure(s) Performed: Procedure(s): REMOVAL PORT-A-CATH (Right)  Patient Location: PACU  Anesthesia Type: MAC  Level of Consciousness: awake and alert   Airway and Oxygen Therapy: Patient Spontanous Breathing  Post-op Pain: none  Post-op Assessment: Post-op Vital signs reviewed, Patient's Cardiovascular Status Stable and Respiratory Function Stable  Post-op Vital Signs: Reviewed  Filed Vitals:   09/12/13 1015  BP: 125/49  Pulse: 71  Temp: 36.6 C  Resp: 11    Complications: No apparent anesthesia complications

## 2013-09-12 NOTE — Discharge Instructions (Signed)
Keep the wound clean and dry for 24 hours. You may shower starting tomorrow. No swimming pools pools or tubs baths.  No sports or heavy lifting for a week or 2  you may drive a car in one to 2 days if you're comfortable.  Call Dr. Dalbert Batman if there are any problems.    Post Anesthesia Home Care Instructions  Activity: Get plenty of rest for the remainder of the day. A responsible adult should stay with you for 24 hours following the procedure.  For the next 24 hours, DO NOT: -Drive a car -Paediatric nurse -Drink alcoholic beverages -Take any medication unless instructed by your physician -Make any legal decisions or sign important papers.  Meals: Start with liquid foods such as gelatin or soup. Progress to regular foods as tolerated. Avoid greasy, spicy, heavy foods. If nausea and/or vomiting occur, drink only clear liquids until the nausea and/or vomiting subsides. Call your physician if vomiting continues.  Special Instructions/Symptoms: Your throat may feel dry or sore from the anesthesia or the breathing tube placed in your throat during surgery. If this causes discomfort, gargle with warm salt water. The discomfort should disappear within 24 hours.

## 2013-09-12 NOTE — Anesthesia Procedure Notes (Signed)
Procedure Name: MAC Date/Time: 09/12/2013 9:45 AM Performed by: Melynda Ripple D Pre-anesthesia Checklist: Patient identified, Timeout performed, Emergency Drugs available, Suction available and Patient being monitored Patient Re-evaluated:Patient Re-evaluated prior to inductionOxygen Delivery Method: Simple face mask

## 2013-09-12 NOTE — Transfer of Care (Signed)
Immediate Anesthesia Transfer of Care Note  Patient: [name] [name]  Procedure(s) Performed: Procedure(s): REMOVAL PORT-A-CATH (Right)  Patient Location: PACU  Anesthesia Type:MAC  Level of Consciousness: awake, alert  and oriented  Airway & Oxygen Therapy: Patient Spontanous Breathing and Patient connected to nasal cannula oxygen  Post-op Assessment: Report given to PACU RN and Post -op Vital signs reviewed and stable  Post vital signs: Reviewed and stable  Complications: No apparent anesthesia complications

## 2013-09-12 NOTE — Op Note (Signed)
Patient Name:           [name] [name]   Date of Surgery:        09/12/2013  Note: This dictation was prepared with Dragon/digital dictation along with [facility] technology. Any transcriptional errors that result from this process are unintentional.  Pre op Diagnosis:      Left breast cancer, multifocal, receptor positive, T1c,. N0, Her-2 +  Right breast cancer, triple negative, T1c, N0.  Recovered uneventfully following bilateral mastectomy with reconstruction  Desires Port-A-Cath removal    Post op Diagnosis:    Same  Procedure:                 Removal of Port-A-Cath  Surgeon:                     Edsel Petrin. Dalbert Batman, M.D., FACS  Assistant:                      None  Operative Indications:   [name] desires Port-A-Cath removal. She has completed her chemotherapy and is now followed by Dr. Jana Hakim.  She would like to get the Port-A-Cath out so that Dr. Harlow Mares can continue expanding her tissue expanders. This patient underwent surgery for what turned out to be bilateral breast cancer on 05/14/2012.  Port-A-Cath was inserted, left total mastectomy and sentinel node biopsy performed. Right total mastectomy performed.  Pathology on the left showed multifocal cancers, stage T1c., N0, grade 2, receptor positive, Her-2 +  A cancer was found in the right breast, T1c,., N0, triple negative breast cancer, grade 3.She has received adjuvant chemotherapy. She desired sedation four-port removal. She is brought to the operating room electively   Operative Findings:       The port and catheter were removed intact. There was no evidence of infection. There was no bleeding.  Procedure in Detail:          The patient was brought to the operating room and placed supine on the operating table. She was monitored and sedated by the anesthesia department. Surgical time out was performed. The right upper chest was prepped and draped in a sterile fashion. 1% Xylocaine with epinephrine was used as local  infiltration anesthetic. A transverse incision was made in the right infraclavicular area through the old scar. Dissection was carried down to the capsule around the port and the capsule was incised. The port was elevated and all the Prolene sutures were cut and removed. The port and catheter were then removed uneventfully. Subcutaneous tissue was closed with 3-0 Vicryl sutures and the skin closed with a running subcuticular 4-0 Monocryl suture and Dermabond. Patient tolerated procedure well and was taken to PACU in stable condition. EBL 5 cc or less. Counts correct. Competition on.     Edsel Petrin. Dalbert Batman, M.D., FACS General and Minimally Invasive Surgery Breast and Colorectal Surgery  09/12/2013 10:10 AM

## 2013-09-12 NOTE — Interval H&P Note (Signed)
History and Physical Interval Note:  09/12/2013 9:38 AM  [name] [name]  has presented today for surgery, with the diagnosis of breast cancer  The goals and the various methods of treatment have been discussed with the patient and family. After consideration of risks, benefits and other options for treatment, the patient has consented to  Procedure(s): REMOVAL PORT-A-CATH (N/A) as a surgical intervention .  The patient's history has been reviewed, patient examined today, no change in status, stable for surgery.  I have reviewed the patient's chart and labs.  Questions were answered to the patient's satisfaction.     Adin Hector

## 2013-09-13 ENCOUNTER — Encounter (HOSPITAL_BASED_OUTPATIENT_CLINIC_OR_DEPARTMENT_OTHER): Payer: Self-pay | Admitting: General Surgery

## 2013-09-15 ENCOUNTER — Encounter (INDEPENDENT_AMBULATORY_CARE_PROVIDER_SITE_OTHER): Payer: BC Managed Care – PPO | Admitting: General Surgery

## 2013-09-28 ENCOUNTER — Other Ambulatory Visit: Payer: BC Managed Care – PPO

## 2013-09-28 ENCOUNTER — Ambulatory Visit: Payer: BC Managed Care – PPO

## 2013-10-10 ENCOUNTER — Other Ambulatory Visit: Payer: Self-pay | Admitting: Oncology

## 2013-10-10 DIAGNOSIS — C50919 Malignant neoplasm of unspecified site of unspecified female breast: Secondary | ICD-10-CM

## 2013-10-11 ENCOUNTER — Telehealth: Payer: Self-pay | Admitting: Oncology

## 2013-10-11 NOTE — Telephone Encounter (Signed)
s.w. pt and r/s appt per pt request....pt ok and aware of new d.t °

## 2013-10-17 ENCOUNTER — Encounter: Payer: Self-pay | Admitting: Interventional Cardiology

## 2013-10-25 ENCOUNTER — Other Ambulatory Visit: Payer: BC Managed Care – PPO

## 2013-10-31 ENCOUNTER — Ambulatory Visit: Payer: BC Managed Care – PPO | Admitting: Oncology

## 2013-12-01 ENCOUNTER — Telehealth: Payer: Self-pay | Admitting: Oncology

## 2013-12-01 NOTE — Telephone Encounter (Signed)
, °

## 2013-12-05 ENCOUNTER — Other Ambulatory Visit: Payer: BC Managed Care – PPO

## 2013-12-12 ENCOUNTER — Ambulatory Visit: Payer: BC Managed Care – PPO | Admitting: Oncology

## 2014-01-09 ENCOUNTER — Other Ambulatory Visit (HOSPITAL_BASED_OUTPATIENT_CLINIC_OR_DEPARTMENT_OTHER): Payer: BC Managed Care – PPO

## 2014-01-09 DIAGNOSIS — C50112 Malignant neoplasm of central portion of left female breast: Secondary | ICD-10-CM

## 2014-01-09 DIAGNOSIS — C50911 Malignant neoplasm of unspecified site of right female breast: Secondary | ICD-10-CM

## 2014-01-09 LAB — CBC WITH DIFFERENTIAL/PLATELET
BASO%: 0.2 % (ref 0.0–2.0)
Basophils Absolute: 0 10*3/uL (ref 0.0–0.1)
EOS%: 1.1 % (ref 0.0–7.0)
Eosinophils Absolute: 0.1 10*3/uL (ref 0.0–0.5)
HCT: 39.4 % (ref 34.8–46.6)
HGB: 12.8 g/dL (ref 11.6–15.9)
LYMPH%: 37.3 % (ref 14.0–49.7)
MCH: 28.3 pg (ref 25.1–34.0)
MCHC: 32.5 g/dL (ref 31.5–36.0)
MCV: 87 fL (ref 79.5–101.0)
MONO#: 0.4 10*3/uL (ref 0.1–0.9)
MONO%: 7.6 % (ref 0.0–14.0)
NEUT#: 2.5 10*3/uL (ref 1.5–6.5)
NEUT%: 53.8 % (ref 38.4–76.8)
Platelets: 256 10*3/uL (ref 145–400)
RBC: 4.53 10*6/uL (ref 3.70–5.45)
RDW: 13.7 % (ref 11.2–14.5)
WBC: 4.7 10*3/uL (ref 3.9–10.3)
lymph#: 1.8 10*3/uL (ref 0.9–3.3)

## 2014-01-09 LAB — COMPREHENSIVE METABOLIC PANEL (CC13)
ALT: 18 U/L (ref 0–55)
AST: 21 U/L (ref 5–34)
Albumin: 3.6 g/dL (ref 3.5–5.0)
Alkaline Phosphatase: 116 U/L (ref 40–150)
Anion Gap: 11 mEq/L (ref 3–11)
BUN: 6.6 mg/dL — ABNORMAL LOW (ref 7.0–26.0)
CO2: 28 mEq/L (ref 22–29)
Calcium: 10.1 mg/dL (ref 8.4–10.4)
Chloride: 104 mEq/L (ref 98–109)
Creatinine: 0.7 mg/dL (ref 0.6–1.1)
Glucose: 189 mg/dl — ABNORMAL HIGH (ref 70–140)
Potassium: 4 mEq/L (ref 3.5–5.1)
Sodium: 144 mEq/L (ref 136–145)
Total Bilirubin: 0.35 mg/dL (ref 0.20–1.20)
Total Protein: 6.6 g/dL (ref 6.4–8.3)

## 2014-01-13 ENCOUNTER — Other Ambulatory Visit: Payer: Self-pay | Admitting: Oncology

## 2014-01-13 DIAGNOSIS — C50912 Malignant neoplasm of unspecified site of left female breast: Secondary | ICD-10-CM

## 2014-01-18 ENCOUNTER — Telehealth: Payer: Self-pay | Admitting: Oncology

## 2014-01-18 ENCOUNTER — Ambulatory Visit (HOSPITAL_BASED_OUTPATIENT_CLINIC_OR_DEPARTMENT_OTHER): Payer: BC Managed Care – PPO | Admitting: Oncology

## 2014-01-18 VITALS — BP 144/71 | HR 91 | Temp 97.8°F | Resp 18 | Ht 64.0 in | Wt 192.7 lb

## 2014-01-18 DIAGNOSIS — C50912 Malignant neoplasm of unspecified site of left female breast: Secondary | ICD-10-CM

## 2014-01-18 DIAGNOSIS — C50911 Malignant neoplasm of unspecified site of right female breast: Secondary | ICD-10-CM

## 2014-01-18 DIAGNOSIS — M899 Disorder of bone, unspecified: Secondary | ICD-10-CM

## 2014-01-18 DIAGNOSIS — Z170 Estrogen receptor positive status [ER+]: Secondary | ICD-10-CM

## 2014-01-18 NOTE — Telephone Encounter (Signed)
, °

## 2014-01-18 NOTE — Progress Notes (Signed)
[facility]  [phone] [phone]     ID: [name] [name] OB: [birth year]  MR#: [id number]  SNK#:[id number]  PCP: [name], MD GYN:  Crawford Givens SU: Fanny Skates OTHER MD: Crissie Reese, Daneen Schick  CHIEF COMPLAINT: bilateral breast cancers CURRENT TREATMENT: letrozole  BREAST CANCER HISTORY: From Dr. Dana Allan original intake note, 04/27/2012:  "[name] [name] is a 63 y.o. female. With medical history significant for diabetes, heart murmurs, hiatal hernia. Patient's labs normal mammogram was 2 years ago. This year she went on to have a screening mammogram performed that showed 2 areas of concern on the left side.calcifications were noted in the left. In the right breast a possible mass warrants in further evaluation.  On 03/29/2012 patient underwent a bilateral diagnostic mammogram and right breast ultrasound. A spot magnification images demonstrate suspicious group of pleomorphic microcalcifications over the outer lower left breast with additional suspicious group of pleomorphic microcalcifications over the outer midportion of the left periareolar region. Spot compression images of the right breast demonstrate persistence of a 1 cm density at the edge of the film in the deep third of the right inner breast. Ultrasound performed showed no focal abnormality over the entire in her right breast to correspond to the mammographic density. Patient was recommended stereotactic core needle biopsy of the 2 groups of suspicious left breast microcalcifications. Because patient and her husband wanted bilateral mastectomies MRI was not performed.on 04/15/2012 patient had needle core biopsy performed of the 2 areas in the left breast. The left needle core biopsy in the lower breast revealed ductal carcinoma in situ with associated comedo necrosis and calcifications with the in situ carcinoma. It was ER +100% PR +12%. 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 Tumor was ER +100% PR +11% proliferation marker Ki-67 elevated at 80% HER-2/neu showed amplification with a ratio of 6.50. Patient was seen by Dr. Fanny Skates. Patient and her family desire bilateral mastectomies with immediate reconstruction. She is now seen in medical oncology for discussion of adjuvant therapy since patient does have a HER-2 positive ER positive breast cancer. Overall she's doing well she is accompanied by her husband and she is without any complaints. Her case was discussed at the multidisciplinary breast conference. Pathology and radiology were reviewed."  Her subsequent history is detailed below  INTERVAL HISTORY: [name] returns today for follow-up of her breast cancer. She continues on letrozole. Hot flashes are "not too bad". She sleeps well. Vaginal dryness is an issue and she has tried to come to the "intimacy and pelvic health" class, but has not found a time that is convenient to do that. She still has some aches and pains here and there, but they are no worse or more persistent than before.  REVIEW OF SYSTEMS: [name] was picking up grandchildren over the holidays and has developed a catch in her back. This is only been present for the last couple of days. She does continue to have aches and pains in her bones in other joints as otherwise noted, but again this is stable. She can be short of breath when walking up stairs but gets to the top without having to stop. She still having problems with nausea and heartburn. Nothing seems to help. She has not tried Prilosec however. She feels forgetful sometimes, but not anxious or depressed. She exercises by doing the treadmill 2-3 times a week, 45 minutes each time. Otherwise a detailed  review of systems today was noncontributory  PAST MEDICAL HISTORY: Past Medical History  Diagnosis Date  . Heart murmur   . Diabetes mellitus without complication   .  Osteopenia   . Cancer     breast  . Bronchitis   . Pneumonia   . Hiatal hernia   . Sinus problem   . Diabetes mellitus   . Breast cancer 04/15/12    left-biopsy  . Breast cancer, right breast 05/14/12    right mastectomy  . CHF with unknown LVEF 03/09/2013  . Chemotherapy-induced cardiomyopathy     PAST SURGICAL HISTORY: Past Surgical History  Procedure Laterality Date  . Cesarean section  1981, 1983  . Tummy tuck  1999  . Tonsillectomy    . Foot surgery    . Gastric bypass    . Wound debridement  01/14/2011    Procedure: DEBRIDEMENT ABDOMINAL WOUND;  Surgeon: Adin Hector, MD;  Location: [facility];  Service: General;  Laterality: N/A;  wound debridement and debridement on the abdomen  . Cosmetic surgery    . Total mastectomy Bilateral 05/14/2012    Procedure: TOTAL MASTECTOMY;  Surgeon: Adin Hector, MD;  Location: [facility];  Service: General;  Laterality: Bilateral;  . Axillary sentinel node biopsy Left 05/14/2012    Procedure: AXILLARY SENTINEL NODE BIOPSY;  Surgeon: Adin Hector, MD;  Location: [facility];  Service: General;  Laterality: Left;  . Portacath placement N/A 05/14/2012    Procedure: INSERTION PORT-A-CATH;  Surgeon: Adin Hector, MD;  Location: [facility];  Service: General;  Laterality: N/A;  . Breast reconstruction with placement of tissue expander and flex hd (acellular hydrated dermis) Bilateral 05/14/2012    Procedure: BREAST RECONSTRUCTION WITH PLACEMENT OF TISSUE EXPANDER AND FLEX HD (ACELLULAR HYDRATED DERMIS);  Surgeon: Crissie Reese, MD;  Location: [facility];  Service: Plastics;  Laterality: Bilateral;  . Bunionectomy    . Port-a-cath removal Right 09/12/2013    Procedure: REMOVAL PORT-A-CATH;  Surgeon: Adin Hector, MD;  Location: [facility];  Service: General;  Laterality: Right;    FAMILY HISTORY Family History  Problem Relation Age of Onset  . Heart attack Father   . Diabetes Father   . Heart attack Brother   .  Diabetes Brother   . Diabetes Sister   . Breast cancer Sister 79  . Diabetes Mother   . Breast cancer Mother 38  . Parkinson's disease Sister   . Heart attack Brother    the patient's father died 2 days after prostate cancer surgery, possibly from a blood clot. The patient's mother lived to be 24 years old. She had been diagnosed with breast cancer around the age of 59. The patient has 3 brothers, 4 sisters. One sister was diagnosed with breast cancer at the age of 56. There is no history of ovarian cancer in the family. The patient underwent genetic testing October 2014, with a VUS as the only finding  GYNECOLOGIC HISTORY:  Menarche age 14, first live birth age 55, the patient is [place] P2. She went through the change of life approximately age 43. 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 He is Broadus John "Joe" Menden, who is a former judge and currently a Architectural technologist. The patient's daughter Garnette Scheuermann is an attorney in [place]. The patient's son Broadus John completed a Masters in Librarian, academic in [place]. The patient has 2 granddaughters.  ADVANCED DIRECTIVES: in place   HEALTH MAINTENANCE: History  Substance Use Topics  . Smoking status: Former Research scientist (life sciences)  . Smokeless tobacco: Never Used  . Alcohol Use: 1.2 - 1.8 oz/week    2-3 Glasses of wine per week     Colonoscopy:  PAP:  Bone density: January 2015  Lipid panel:  Allergies  Allergen Reactions  . Darvon Hives and Itching    All over body  . Penicillins Hives and Itching    All over body    Current Outpatient Prescriptions  Medication Sig Dispense Refill  . Ascorbic Acid (VITAMIN C) 500 MG tablet Take 500 mg by mouth daily.      . Biotin 10 MG TABS Take by mouth. 4000 mcg daily    . Calcium Citrate (CITRACAL PO) Take by mouth.    . carvedilol (COREG) 3.125 MG tablet Take 1 tablet (3.125 mg total) by mouth 2 (two) times daily with a  meal. 60 tablet 3  . Cholecalciferol (VITAMIN D PO) Take 1 tablet by mouth daily.    Mariane Baumgarten Sodium (COLACE PO) Take by mouth.    Marland Kitchen HYDROcodone-acetaminophen (NORCO) 5-325 MG per tablet Take 1-2 tablets by mouth every 6 (six) hours as needed. 30 tablet 0  . KRILL OIL PO Take by mouth.    . letrozole (FEMARA) 2.5 MG tablet TAKE 1 TABLET (2.5 MG TOTAL) BY MOUTH DAILY. 90 tablet 0  . lidocaine-prilocaine (EMLA) cream APPLY TOPICALLY AS NEEDED. 30 g 0  . Multiple Vitamin (MULTIVITAMIN PO) Take 1 tablet by mouth daily.     . Probiotic Product (PROBIOTIC DAILY PO) Take by mouth daily.    . rosuvastatin (CRESTOR) 5 MG tablet Take 1 tablet (5 mg total) by mouth daily at 6 PM. 30 tablet 6  . sitaGLIPtin (JANUVIA) 100 MG tablet Take 100 mg by mouth daily.    Marland Kitchen UNABLE TO FIND Apply 1 Device topically once. Cranial prosthesis r/t chemotherapy treatment 1 Device 0   No current facility-administered medications for this visit.    OBJECTIVE: Middle-aged [place] American woman in no acute distress Filed Vitals:   01/18/14 0907  BP: 144/71  Pulse: 91  Temp: 97.8 F (36.6 C)  Resp: 18     Body mass index is 33.06 kg/(m^2).    ECOG FS:1 - Symptomatic but completely ambulatory  Ocular: Sclerae unicteric, EOMs intact Ear-nose-throat: Oropharynx clear, teeth in good repair Lymphatic: No cervical or supraclavicular adenopathy Lungs no rales or rhonchi Heart regular rate and rhythm Abd soft, nontender, positive bowel sounds MSK no focal spinal tenderness, no upper extremity lymphedema Neuro: non-focal, well-oriented, appropriate affect Breasts: Status post bilateral mastectomies with implants in place. There is no evidence of chest recurrence. Both axillae are benign.  LAB RESULTS:  CMP     Component Value Date/Time   NA 144 01/09/2014 1401   NA 144 07/14/2013 1516   K 4.0 01/09/2014 1401   K 4.3 07/14/2013 1516   CL 104 07/14/2013 1516   CL 104 07/30/2012 0955   CO2 28 01/09/2014 1401    CO2 32 07/14/2013 1516   GLUCOSE 189* 01/09/2014 1401   GLUCOSE 117* 07/14/2013 1516   GLUCOSE 138* 07/30/2012 0955   BUN 6.6* 01/09/2014 1401   BUN 8 07/14/2013 1516   CREATININE 0.7 01/09/2014 1401   CREATININE 0.7 07/14/2013 1516   CALCIUM 10.1 01/09/2014 1401   CALCIUM 9.9 07/14/2013 1516   PROT 6.6 01/09/2014 1401   PROT 7.4 05/10/2012 1440   ALBUMIN 3.6  01/09/2014 1401   ALBUMIN 4.1 05/10/2012 1440   AST 21 01/09/2014 1401   AST 25 05/10/2012 1440   ALT 18 01/09/2014 1401   ALT 17 05/10/2012 1440   ALKPHOS 116 01/09/2014 1401   ALKPHOS 103 05/10/2012 1440   BILITOT 0.35 01/09/2014 1401   BILITOT 0.4 05/10/2012 1440   GFRNONAA >90 05/17/2012 0530   GFRAA >90 05/17/2012 0530    I No results found for: SPEP  Lab Results  Component Value Date   WBC 4.7 01/09/2014   NEUTROABS 2.5 01/09/2014   HGB 12.8 01/09/2014   HCT 39.4 01/09/2014   MCV 87.0 01/09/2014   PLT 256 01/09/2014      Chemistry      Component Value Date/Time   NA 144 01/09/2014 1401   NA 144 07/14/2013 1516   K 4.0 01/09/2014 1401   K 4.3 07/14/2013 1516   CL 104 07/14/2013 1516   CL 104 07/30/2012 0955   CO2 28 01/09/2014 1401   CO2 32 07/14/2013 1516   BUN 6.6* 01/09/2014 1401   BUN 8 07/14/2013 1516   CREATININE 0.7 01/09/2014 1401   CREATININE 0.7 07/14/2013 1516      Component Value Date/Time   CALCIUM 10.1 01/09/2014 1401   CALCIUM 9.9 07/14/2013 1516   ALKPHOS 116 01/09/2014 1401   ALKPHOS 103 05/10/2012 1440   AST 21 01/09/2014 1401   AST 25 05/10/2012 1440   ALT 18 01/09/2014 1401   ALT 17 05/10/2012 1440   BILITOT 0.35 01/09/2014 1401   BILITOT 0.4 05/10/2012 1440       Lab Results  Component Value Date   LABCA2 9 05/10/2012    No components found for: [id number]  No results for input(s): INR in the last 168 hours.  Urinalysis    Component Value Date/Time   COLORURINE YELLOW 05/10/2012 1444   APPEARANCEUR CLEAR 05/10/2012 1444   LABSPEC 1.009 05/10/2012 1444    PHURINE 7.0 05/10/2012 1444   GLUCOSEU NEGATIVE 05/10/2012 1444   HGBUR NEGATIVE 05/10/2012 1444   BILIRUBINUR NEGATIVE 05/10/2012 1444   KETONESUR NEGATIVE 05/10/2012 1444   PROTEINUR NEGATIVE 05/10/2012 1444   UROBILINOGEN 0.2 05/10/2012 1444   NITRITE NEGATIVE 05/10/2012 1444   LEUKOCYTESUR MODERATE* 05/10/2012 1444    STUDIES:  ------------------------------------------------------------------- Transthoracic Echocardiography  Patient:  Charney, [name] G MR #:    [id number] Study Date: 07/25/2013 Gender:   F Age:    65 Height:   162.6 cm Weight:   87.4 kg BSA:    2.02 m^2 Pt. Status: Room:  SONOGRAPHER Melissa Morford, RDCS ORDERING   Clegg, Amy D PERFORMING  [facility], Outpatient  cc:  ------------------------------------------------------------------- LV EF: 55% -  60%  ASSESSMENT: 63 y.o. BRCA negative [place] woman status post bilateral mastectomies 05/14/2012  (a) on the left, mpT1c pN0, stage IA invasive ductal carcinoma, grade 2, estrogen receptor 100% positive, progesterone receptor 11% positive, with an MIB-1 of 80%, and HER-2 amplified, the ratio being 6.50.  (b) on the right,pT1c pN0, stage IA invasive ductal carcinoma, grade 3, triple negative, with an MIB-1 of 92%  (1) treated adjuvantly with doxorubicin and cyclophosphamide in dose dense fashion x4, completed 08/13/2012, followed by weekly Abraxane x12 given with concurrent trastuzumab, completed 12/03/2012  (2) received trastuzumab between 08/27/2012 and 06/29/2013, discontinued because of concerns regarding weakening of the heart muscle  (3) letrozole started November 2014; with fatigue and multiple arthralgias/myalgias, letrozole was held for 6 weeks with no improvement in symptoms, and so it was resumed.   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 There were no mutations in the genetic panel tested, which included ATM, BARD1, BRCA1, BRCA2, BRIP1, CDH1, CHEK2, EPCAM, FANCC, MLH1, MSH2, MSH6, NBN, PALB2, PMS2, PTEN, RAD51C, RAD51D, STK11, TP53, and XRCC2   PLAN: [name] is doing much better with the letrozole and the plan is going to be to continue that through November 2016 at which point we will consider switching to tamoxifen.  The advantages of tamoxifen, of course, will be that she would be able to use vaginal estrogens for the vaginal atrophy issue and that tamoxifen helps the bones while that result tends to weaken them further.  [name] has a good understanding of the overall plan. She agrees with it. She knows the goal of treatment in her case is cure. She will call with any problems that may develop before her next visit here.  Chauncey Cruel, MD   01/18/2014 9:14 AM

## 2014-01-26 ENCOUNTER — Other Ambulatory Visit: Payer: Self-pay | Admitting: Oncology

## 2014-01-26 DIAGNOSIS — C50911 Malignant neoplasm of unspecified site of right female breast: Secondary | ICD-10-CM

## 2014-01-30 ENCOUNTER — Other Ambulatory Visit: Payer: Self-pay | Admitting: Oncology

## 2014-07-13 ENCOUNTER — Other Ambulatory Visit: Payer: Self-pay | Admitting: *Deleted

## 2014-07-13 DIAGNOSIS — C50911 Malignant neoplasm of unspecified site of right female breast: Secondary | ICD-10-CM

## 2014-07-18 ENCOUNTER — Other Ambulatory Visit: Payer: Self-pay | Admitting: *Deleted

## 2014-07-18 ENCOUNTER — Other Ambulatory Visit: Payer: BC Managed Care – PPO

## 2014-07-19 ENCOUNTER — Telehealth: Payer: Self-pay | Admitting: Oncology

## 2014-07-19 ENCOUNTER — Telehealth: Payer: Self-pay | Admitting: Nurse Practitioner

## 2014-07-19 NOTE — Telephone Encounter (Signed)
Called and spoke with patient and she is aware of her lab appointment

## 2014-07-19 NOTE — Telephone Encounter (Signed)
Returned Advertising account executive. Left message to confirm lab & visit 6/07.

## 2014-07-25 ENCOUNTER — Other Ambulatory Visit (HOSPITAL_BASED_OUTPATIENT_CLINIC_OR_DEPARTMENT_OTHER): Payer: BLUE CROSS/BLUE SHIELD

## 2014-07-25 ENCOUNTER — Telehealth: Payer: Self-pay | Admitting: Nurse Practitioner

## 2014-07-25 ENCOUNTER — Other Ambulatory Visit: Payer: Self-pay | Admitting: *Deleted

## 2014-07-25 ENCOUNTER — Ambulatory Visit (HOSPITAL_BASED_OUTPATIENT_CLINIC_OR_DEPARTMENT_OTHER): Payer: BLUE CROSS/BLUE SHIELD | Admitting: Nurse Practitioner

## 2014-07-25 ENCOUNTER — Encounter: Payer: Self-pay | Admitting: Nurse Practitioner

## 2014-07-25 VITALS — BP 153/68 | HR 74 | Temp 98.2°F | Resp 18 | Ht 64.0 in | Wt 200.4 lb

## 2014-07-25 DIAGNOSIS — C50112 Malignant neoplasm of central portion of left female breast: Secondary | ICD-10-CM | POA: Diagnosis not present

## 2014-07-25 DIAGNOSIS — C50912 Malignant neoplasm of unspecified site of left female breast: Secondary | ICD-10-CM

## 2014-07-25 DIAGNOSIS — Z170 Estrogen receptor positive status [ER+]: Secondary | ICD-10-CM | POA: Diagnosis not present

## 2014-07-25 DIAGNOSIS — N898 Other specified noninflammatory disorders of vagina: Secondary | ICD-10-CM

## 2014-07-25 DIAGNOSIS — C50911 Malignant neoplasm of unspecified site of right female breast: Secondary | ICD-10-CM | POA: Diagnosis not present

## 2014-07-25 DIAGNOSIS — Z79811 Long term (current) use of aromatase inhibitors: Secondary | ICD-10-CM

## 2014-07-25 DIAGNOSIS — C50111 Malignant neoplasm of central portion of right female breast: Secondary | ICD-10-CM

## 2014-07-25 DIAGNOSIS — Z171 Estrogen receptor negative status [ER-]: Secondary | ICD-10-CM

## 2014-07-25 DIAGNOSIS — M8580 Other specified disorders of bone density and structure, unspecified site: Secondary | ICD-10-CM | POA: Diagnosis not present

## 2014-07-25 LAB — CBC WITH DIFFERENTIAL/PLATELET
BASO%: 0.2 % (ref 0.0–2.0)
Basophils Absolute: 0 10*3/uL (ref 0.0–0.1)
EOS%: 1.7 % (ref 0.0–7.0)
Eosinophils Absolute: 0.1 10*3/uL (ref 0.0–0.5)
HCT: 41 % (ref 34.8–46.6)
HGB: 13.6 g/dL (ref 11.6–15.9)
LYMPH%: 26.8 % (ref 14.0–49.7)
MCH: 28.8 pg (ref 25.1–34.0)
MCHC: 33.2 g/dL (ref 31.5–36.0)
MCV: 86.9 fL (ref 79.5–101.0)
MONO#: 0.4 10*3/uL (ref 0.1–0.9)
MONO%: 8.9 % (ref 0.0–14.0)
NEUT#: 2.9 10*3/uL (ref 1.5–6.5)
NEUT%: 62.4 % (ref 38.4–76.8)
Platelets: 262 10*3/uL (ref 145–400)
RBC: 4.72 10*6/uL (ref 3.70–5.45)
RDW: 13.7 % (ref 11.2–14.5)
WBC: 4.6 10*3/uL (ref 3.9–10.3)
lymph#: 1.2 10*3/uL (ref 0.9–3.3)

## 2014-07-25 LAB — COMPREHENSIVE METABOLIC PANEL (CC13)
ALT: 20 U/L (ref 0–55)
AST: 24 U/L (ref 5–34)
Albumin: 3.5 g/dL (ref 3.5–5.0)
Alkaline Phosphatase: 116 U/L (ref 40–150)
Anion Gap: 11 mEq/L (ref 3–11)
BUN: 10.1 mg/dL (ref 7.0–26.0)
CO2: 26 mEq/L (ref 22–29)
Calcium: 9.9 mg/dL (ref 8.4–10.4)
Chloride: 105 mEq/L (ref 98–109)
Creatinine: 0.7 mg/dL (ref 0.6–1.1)
EGFR: >90 mL/min/{1.73_m2} (ref >90)
Glucose: 223 mg/dl — ABNORMAL HIGH (ref 70–140)
Potassium: 4.2 mEq/L (ref 3.5–5.1)
Sodium: 142 mEq/L (ref 136–145)
Total Bilirubin: 0.43 mg/dL (ref 0.20–1.20)
Total Protein: 6.7 g/dL (ref 6.4–8.3)

## 2014-07-25 MED ORDER — LETROZOLE 2.5 MG PO TABS: ORAL_TABLET | ORAL | Status: DC

## 2014-07-25 NOTE — Telephone Encounter (Signed)
Appointments made and avs pritned for patient °

## 2014-07-25 NOTE — Progress Notes (Signed)
[place]  Telephone:([phone]) [phone] Fax:(336) [phone]     ID: [name] G Loseke OB: [birth year]  MR#: [id number]  JJH#:[id number]  PCP: [name], MD GYN:  Crawford Givens SU: Fanny Skates OTHER MD: Crissie Reese, Daneen Schick  CHIEF COMPLAINT: bilateral breast cancers CURRENT TREATMENT: letrozole  BREAST CANCER HISTORY: From Dr. Dana Allan original intake note, 04/27/2012:  "[name] [name] is a 64 y.o. female. With medical history significant for diabetes, heart murmurs, hiatal hernia. Patient's labs normal mammogram was 2 years ago. This year she went on to have a screening mammogram performed that showed 2 areas of concern on the left side.calcifications were noted in the left. In the right breast a possible mass warrants in further evaluation.  On 03/29/2012 patient underwent a bilateral diagnostic mammogram and right breast ultrasound. A spot magnification images demonstrate suspicious group of pleomorphic microcalcifications over the outer lower left breast with additional suspicious group of pleomorphic microcalcifications over the outer midportion of the left periareolar region. Spot compression images of the right breast demonstrate persistence of a 1 cm density at the edge of the film in the deep third of the right inner breast. Ultrasound performed showed no focal abnormality over the entire in her right breast to correspond to the mammographic density. Patient was recommended stereotactic core needle biopsy of the 2 groups of suspicious left breast microcalcifications. Because patient and her husband wanted bilateral mastectomies MRI was not performed.on 04/15/2012 patient had needle core biopsy performed of the 2 areas in the left breast. The left needle core biopsy in the lower breast revealed ductal carcinoma in situ with associated comedo necrosis and calcifications with the in situ carcinoma. It was ER +100% PR +12%. 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 Tumor was ER +100% PR +11% proliferation marker Ki-67 elevated at 80% HER-2/neu showed amplification with a ratio of 6.50. Patient was seen by Dr. Fanny Skates. Patient and her family desire bilateral mastectomies with immediate reconstruction. She is now seen in medical oncology for discussion of adjuvant therapy since patient does have a HER-2 positive ER positive breast cancer. Overall she's doing well she is accompanied by her husband and she is without any complaints. Her case was discussed at the multidisciplinary breast conference. Pathology and radiology were reviewed."  Her subsequent history is detailed below  INTERVAL HISTORY: [name] returns today for follow-up of her breast cancer. She has been on letrozole since November 2014 and is tolerating it reasonably well. She has mild hot flashes and vaginal dryness for which she uses water based lubricants. She continues to have joint aches and stiffness in the mornings, but this is no worse than previously described.   REVIEW OF SYSTEMS: [name] denies fevers, chills, or changes in bowel or bladder habits. She has occasional nausea due to severe indigestions. She was on protonix, but taken off of it by her PCP because she had been on it for too long. TUMS are not helpful. She is short of breath with exertion, but denies chest pain, cough, or palpitations. She is regularly physically active on the treadmill or elliptical a few times weekly. She denies anxiety or depression. A detailed review of systems is otherwise stable.  PAST MEDICAL HISTORY: Past Medical History  Diagnosis Date  . Heart murmur   . Diabetes mellitus without complication   . Osteopenia   . Cancer     breast  . Bronchitis   . Pneumonia   .  Hiatal hernia   . Sinus problem   . Diabetes mellitus   . Breast cancer 04/15/12    left-biopsy  . Breast cancer, right breast 05/14/12    right mastectomy  .  CHF with unknown LVEF 03/09/2013  . Chemotherapy-induced cardiomyopathy     PAST SURGICAL HISTORY: Past Surgical History  Procedure Laterality Date  . Cesarean section  1981, 1983  . Tummy tuck  1999  . Tonsillectomy    . Foot surgery    . Gastric bypass    . Wound debridement  01/14/2011    Procedure: DEBRIDEMENT ABDOMINAL WOUND;  Surgeon: Adin Hector, MD;  Location: [facility];  Service: General;  Laterality: N/A;  wound debridement and debridement on the abdomen  . Cosmetic surgery    . Total mastectomy Bilateral 05/14/2012    Procedure: TOTAL MASTECTOMY;  Surgeon: Adin Hector, MD;  Location: [facility];  Service: General;  Laterality: Bilateral;  . Axillary sentinel node biopsy Left 05/14/2012    Procedure: AXILLARY SENTINEL NODE BIOPSY;  Surgeon: Adin Hector, MD;  Location: [facility];  Service: General;  Laterality: Left;  . Portacath placement N/A 05/14/2012    Procedure: INSERTION PORT-A-CATH;  Surgeon: Adin Hector, MD;  Location: [facility];  Service: General;  Laterality: N/A;  . Breast reconstruction with placement of tissue expander and flex hd (acellular hydrated dermis) Bilateral 05/14/2012    Procedure: BREAST RECONSTRUCTION WITH PLACEMENT OF TISSUE EXPANDER AND FLEX HD (ACELLULAR HYDRATED DERMIS);  Surgeon: Crissie Reese, MD;  Location: [facility];  Service: Plastics;  Laterality: Bilateral;  . Bunionectomy    . Port-a-cath removal Right 09/12/2013    Procedure: REMOVAL PORT-A-CATH;  Surgeon: Adin Hector, MD;  Location: [facility];  Service: General;  Laterality: Right;    FAMILY HISTORY Family History  Problem Relation Age of Onset  . Heart attack Father   . Diabetes Father   . Heart attack Brother   . Diabetes Brother   . Diabetes Sister   . Breast cancer Sister 53  . Diabetes Mother   . Breast cancer Mother 25  . Parkinson's disease Sister   . Heart attack Brother    the patient's father died 2 days after prostate cancer surgery,  possibly from a blood clot. The patient's mother lived to be 73 years old. She had been diagnosed with breast cancer around the age of 61. The patient has 3 brothers, 4 sisters. One sister was diagnosed with breast cancer at the age of 36. There is no history of ovarian cancer in the family. The patient underwent genetic testing October 2014, with a VUS as the only finding  GYNECOLOGIC HISTORY:  Menarche age 77, first live birth age 19, the patient is [place] P2. She went through the change of life approximately age 76. 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 He is Broadus John "Joe" Slaugh, who is a former judge and currently a Architectural technologist. The patient's daughter Garnette Scheuermann is an attorney in [place]. The patient's son Broadus John completed a Masters in Librarian, academic in [place]. The patient has 2 granddaughters.    ADVANCED DIRECTIVES: in place   HEALTH MAINTENANCE: History  Substance Use Topics  . Smoking status: Former Research scientist (life sciences)  . Smokeless tobacco: Never Used  . Alcohol Use: 1.2 - 1.8 oz/week    2-3 Glasses of wine per week     Colonoscopy:  PAP:  Bone density: January 2015  Lipid panel:  Allergies  Allergen Reactions  . Darvon Hives and Itching    All over body  . Penicillins Hives and Itching    All over body    Current Outpatient Prescriptions  Medication Sig Dispense Refill  . Ascorbic Acid (VITAMIN C) 500 MG tablet Take 500 mg by mouth daily.      . Biotin 10 MG TABS Take by mouth. 4000 mcg daily    . Calcium Citrate (CITRACAL PO) Take by mouth.    . Cholecalciferol (VITAMIN D PO) Take 1 tablet by mouth daily.    Marland Kitchen KRILL OIL PO Take by mouth.    . letrozole (FEMARA) 2.5 MG tablet TAKE 1 TABLET (2.5 MG TOTAL) BY MOUTH DAILY. 90 tablet 0  . Multiple Vitamin (MULTIVITAMIN PO) Take 1 tablet by mouth daily.     . rosuvastatin (CRESTOR) 5 MG tablet Take 1 tablet (5 mg total) by mouth daily  at 6 PM. 30 tablet 6  . sitaGLIPtin (JANUVIA) 100 MG tablet Take 100 mg by mouth daily.    Marland Kitchen UNABLE TO FIND Fiber Gummies. Pt takes 3 gummies daily by mouth.    Mariane Baumgarten Sodium (COLACE PO) Take by mouth as needed.      No current facility-administered medications for this visit.    OBJECTIVE: Middle-aged [place] American woman in no acute distress Filed Vitals:   07/25/14 0932  BP: 153/68  Pulse: 74  Temp: 98.2 F (36.8 C)  Resp: 18     Body mass index is 34.38 kg/(m^2).    ECOG FS:1 - Symptomatic but completely ambulatory  Skin: warm, dry  HEENT: sclerae anicteric, conjunctivae pink, oropharynx clear. No thrush or mucositis.  Lymph Nodes: No cervical or supraclavicular lymphadenopathy  Lungs: clear to auscultation bilaterally, no rales, wheezes, or rhonci  Heart: regular rate and rhythm  Abdomen: round, soft, non tender, positive bowel sounds  Musculoskeletal: No focal spinal tenderness, no peripheral edema  Neuro: non focal, well oriented, positive affect  Breasts: bilateral breasts status post mastectomies with implant reconstruction. No evidence of recurrent disease. Bilateral axillae benign.  LAB RESULTS:  CMP     Component Value Date/Time   NA 142 07/25/2014 0908   NA 144 07/14/2013 1516   K 4.2 07/25/2014 0908   K 4.3 07/14/2013 1516   CL 104 07/14/2013 1516   CL 104 07/30/2012 0955   CO2 26 07/25/2014 0908   CO2 32 07/14/2013 1516   GLUCOSE 223* 07/25/2014 0908   GLUCOSE 117* 07/14/2013 1516   GLUCOSE 138* 07/30/2012 0955   BUN 10.1 07/25/2014 0908   BUN 8 07/14/2013 1516   CREATININE 0.7 07/25/2014 0908   CREATININE 0.7 07/14/2013 1516   CALCIUM 9.9 07/25/2014 0908   CALCIUM 9.9 07/14/2013 1516   PROT 6.7 07/25/2014 0908   PROT 7.4 05/10/2012 1440   ALBUMIN 3.5 07/25/2014 0908   ALBUMIN 4.1 05/10/2012 1440   AST 24 07/25/2014 0908   AST 25 05/10/2012 1440   ALT 20 07/25/2014 0908   ALT 17 05/10/2012 1440   ALKPHOS 116 07/25/2014 0908   ALKPHOS 103  05/10/2012 1440   BILITOT 0.43 07/25/2014 0908   BILITOT 0.4 05/10/2012 1440   GFRNONAA >90 05/17/2012 0530   GFRAA >90 05/17/2012 0530    I No results found for: SPEP  Lab Results  Component Value Date   WBC 4.6 07/25/2014   NEUTROABS 2.9 07/25/2014   HGB 13.6 07/25/2014   HCT 41.0 07/25/2014  MCV 86.9 07/25/2014   PLT 262 07/25/2014      Chemistry      Component Value Date/Time   NA 142 07/25/2014 0908   NA 144 07/14/2013 1516   K 4.2 07/25/2014 0908   K 4.3 07/14/2013 1516   CL 104 07/14/2013 1516   CL 104 07/30/2012 0955   CO2 26 07/25/2014 0908   CO2 32 07/14/2013 1516   BUN 10.1 07/25/2014 0908   BUN 8 07/14/2013 1516   CREATININE 0.7 07/25/2014 0908   CREATININE 0.7 07/14/2013 1516      Component Value Date/Time   CALCIUM 9.9 07/25/2014 0908   CALCIUM 9.9 07/14/2013 1516   ALKPHOS 116 07/25/2014 0908   ALKPHOS 103 05/10/2012 1440   AST 24 07/25/2014 0908   AST 25 05/10/2012 1440   ALT 20 07/25/2014 0908   ALT 17 05/10/2012 1440   BILITOT 0.43 07/25/2014 0908   BILITOT 0.4 05/10/2012 1440       Lab Results  Component Value Date   LABCA2 9 05/10/2012    No components found for: [id number]  No results for input(s): INR in the last 168 hours.  Urinalysis    Component Value Date/Time   COLORURINE YELLOW 05/10/2012 1444   APPEARANCEUR CLEAR 05/10/2012 1444   LABSPEC 1.009 05/10/2012 1444   PHURINE 7.0 05/10/2012 1444   GLUCOSEU NEGATIVE 05/10/2012 1444   HGBUR NEGATIVE 05/10/2012 1444   BILIRUBINUR NEGATIVE 05/10/2012 1444   KETONESUR NEGATIVE 05/10/2012 1444   PROTEINUR NEGATIVE 05/10/2012 1444   UROBILINOGEN 0.2 05/10/2012 1444   NITRITE NEGATIVE 05/10/2012 1444   LEUKOCYTESUR MODERATE* 05/10/2012 1444    STUDIES:  No results found.  ASSESSMENT: 64 y.o. BRCA negative [place] woman status post bilateral mastectomies 05/14/2012  (a) on the left, mpT1c pN0, stage IA invasive ductal carcinoma, grade 2, estrogen receptor 100% positive,  progesterone receptor 11% positive, with an MIB-1 of 80%, and HER-2 amplified, the ratio being 6.50.  (b) on the right,pT1c pN0, stage IA invasive ductal carcinoma, grade 3, triple negative, with an MIB-1 of 92%  (1) treated adjuvantly with doxorubicin and cyclophosphamide in dose dense fashion x4, completed 08/13/2012, followed by weekly Abraxane x12 given with concurrent trastuzumab, completed 12/03/2012  (2) received trastuzumab between 08/27/2012 and 06/29/2013, discontinued because of concerns regarding weakening of the heart muscle  (3) letrozole started November 2014; with fatigue and multiple arthralgias/myalgias, letrozole was held for 6 weeks with no improvement in symptoms, and so it was resumed.   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 There were no mutations in the genetic panel tested, which included ATM, BARD1, BRCA1, BRCA2, BRIP1, CDH1, CHEK2, EPCAM, FANCC, MLH1, MSH2, MSH6, NBN, PALB2, PMS2, PTEN, RAD51C, RAD51D, STK11, TP53, and XRCC2   PLAN: [name] is doing well as far as her breast cancer is concerned. She is 2 years out from her definitive surgery with no evidence of recurrent disease. She is tolerating the letrozole as best she can. I encouraged her to try coconut oil for her vaginal dryness.   [name] will return in 6 months for labs and a follow up visit with Dr. Jana Hakim. At this time they will discuss possibly switching to tamoxifen for the duration of her antiestrogen therapy. She understands and agrees with this plan. She knows the goal of treatment in her case is cure. She has been encouraged to  call with any issues that might arise before her next visit here.   Laurie Panda, NP   07/25/2014 10:23 AM

## 2014-08-14 ENCOUNTER — Other Ambulatory Visit: Payer: Self-pay

## 2014-11-17 ENCOUNTER — Encounter: Payer: Self-pay | Admitting: Genetic Counselor

## 2014-11-17 DIAGNOSIS — Z1379 Encounter for other screening for genetic and chromosomal anomalies: Secondary | ICD-10-CM | POA: Insufficient documentation

## 2014-11-24 ENCOUNTER — Telehealth: Payer: Self-pay | Admitting: Oncology

## 2014-11-24 NOTE — Telephone Encounter (Signed)
Called patient. No answer/busy. Left voicemail to return call.

## 2014-11-24 NOTE — Telephone Encounter (Signed)
Patient called in and left a message that she has found a lump in her left breast and has itching in her arm i have forwarded the vm to triage

## 2014-11-27 ENCOUNTER — Other Ambulatory Visit: Payer: Self-pay | Admitting: Oncology

## 2014-11-27 ENCOUNTER — Telehealth: Payer: Self-pay | Admitting: Oncology

## 2014-11-27 NOTE — Telephone Encounter (Signed)
Lumps in L breast tissue. Noticed for about the last month, upper breast area. No pain, smaller than pea, feels 2. Non tender.  L arm a lot of itching - no rash or anything, no swelling, no redness, no heat. Bruising from scratching. Been itching about 2 weeks. Creams not helping. Use mobile phone. Back in town on the 17th. She is at a work Hospital doctor.

## 2014-11-27 NOTE — Telephone Encounter (Signed)
pateint called back in as she states that she did not get the message on Friday from the nurse,called pateint back this morning to let her know  That i have resent this information and left triage a voicemail.  Please call [phone]

## 2014-11-27 NOTE — Telephone Encounter (Signed)
POF entered

## 2014-11-28 ENCOUNTER — Telehealth: Payer: Self-pay | Admitting: Oncology

## 2014-11-28 ENCOUNTER — Other Ambulatory Visit: Payer: Self-pay | Admitting: *Deleted

## 2014-11-28 NOTE — Telephone Encounter (Signed)
Tried to call patient and her phone is not taking any calls at this time   [place]

## 2014-11-28 NOTE — Telephone Encounter (Signed)
Pt contacted per MD recommendation- will await call from scheduling.

## 2014-12-26 ENCOUNTER — Other Ambulatory Visit: Payer: Self-pay | Admitting: Internal Medicine

## 2014-12-26 DIAGNOSIS — N63 Unspecified lump in unspecified breast: Secondary | ICD-10-CM

## 2015-01-04 ENCOUNTER — Ambulatory Visit
Admission: RE | Admit: 2015-01-04 | Discharge: 2015-01-04 | Disposition: A | Discharge status: Home / Self Care | Payer: BLUE CROSS/BLUE SHIELD | Source: Ambulatory Visit | Attending: Internal Medicine | Admitting: Internal Medicine

## 2015-01-04 DIAGNOSIS — N63 Unspecified lump in unspecified breast: Secondary | ICD-10-CM

## 2015-01-16 ENCOUNTER — Other Ambulatory Visit: Payer: BC Managed Care – PPO

## 2015-01-22 ENCOUNTER — Other Ambulatory Visit: Payer: Self-pay

## 2015-01-22 DIAGNOSIS — C50911 Malignant neoplasm of unspecified site of right female breast: Secondary | ICD-10-CM

## 2015-01-22 DIAGNOSIS — C50912 Malignant neoplasm of unspecified site of left female breast: Secondary | ICD-10-CM

## 2015-01-23 ENCOUNTER — Other Ambulatory Visit (HOSPITAL_BASED_OUTPATIENT_CLINIC_OR_DEPARTMENT_OTHER): Payer: BLUE CROSS/BLUE SHIELD

## 2015-01-23 ENCOUNTER — Encounter: Payer: Self-pay | Admitting: Oncology

## 2015-01-23 ENCOUNTER — Ambulatory Visit (HOSPITAL_BASED_OUTPATIENT_CLINIC_OR_DEPARTMENT_OTHER): Payer: BLUE CROSS/BLUE SHIELD | Admitting: Oncology

## 2015-01-23 ENCOUNTER — Telehealth: Payer: Self-pay | Admitting: Oncology

## 2015-01-23 ENCOUNTER — Other Ambulatory Visit: Payer: Self-pay | Admitting: *Deleted

## 2015-01-23 VITALS — BP 160/69 | HR 86 | Temp 98.2°F | Resp 20 | Ht 64.0 in | Wt 199.7 lb

## 2015-01-23 DIAGNOSIS — Z170 Estrogen receptor positive status [ER+]: Secondary | ICD-10-CM | POA: Diagnosis not present

## 2015-01-23 DIAGNOSIS — Z79811 Long term (current) use of aromatase inhibitors: Secondary | ICD-10-CM

## 2015-01-23 DIAGNOSIS — C50912 Malignant neoplasm of unspecified site of left female breast: Secondary | ICD-10-CM

## 2015-01-23 DIAGNOSIS — C50911 Malignant neoplasm of unspecified site of right female breast: Secondary | ICD-10-CM

## 2015-01-23 DIAGNOSIS — R635 Abnormal weight gain: Secondary | ICD-10-CM

## 2015-01-23 DIAGNOSIS — Z171 Estrogen receptor negative status [ER-]: Secondary | ICD-10-CM | POA: Diagnosis not present

## 2015-01-23 DIAGNOSIS — C50512 Malignant neoplasm of lower-outer quadrant of left female breast: Secondary | ICD-10-CM

## 2015-01-23 LAB — CBC WITH DIFFERENTIAL/PLATELET
BASO%: 1 % (ref 0.0–2.0)
Basophils Absolute: 0 10*3/uL (ref 0.0–0.1)
EOS%: 2.3 % (ref 0.0–7.0)
Eosinophils Absolute: 0.1 10*3/uL (ref 0.0–0.5)
HCT: 40.1 % (ref 34.8–46.6)
HGB: 13.2 g/dL (ref 11.6–15.9)
LYMPH%: 29.8 % (ref 14.0–49.7)
MCH: 28.7 pg (ref 25.1–34.0)
MCHC: 32.9 g/dL (ref 31.5–36.0)
MCV: 87.3 fL (ref 79.5–101.0)
MONO#: 0.4 10*3/uL (ref 0.1–0.9)
MONO%: 9.9 % (ref 0.0–14.0)
NEUT#: 2.3 10*3/uL (ref 1.5–6.5)
NEUT%: 57 % (ref 38.4–76.8)
Platelets: 272 10*3/uL (ref 145–400)
RBC: 4.59 10*6/uL (ref 3.70–5.45)
RDW: 13.5 % (ref 11.2–14.5)
WBC: 4 10*3/uL (ref 3.9–10.3)
lymph#: 1.2 10*3/uL (ref 0.9–3.3)

## 2015-01-23 LAB — COMPREHENSIVE METABOLIC PANEL
ALT: 15 U/L (ref 0–55)
AST: 18 U/L (ref 5–34)
Albumin: 3.5 g/dL (ref 3.5–5.0)
Alkaline Phosphatase: 106 U/L (ref 40–150)
Anion Gap: 9 mEq/L (ref 3–11)
BUN: 9.4 mg/dL (ref 7.0–26.0)
CO2: 27 mEq/L (ref 22–29)
Calcium: 9.8 mg/dL (ref 8.4–10.4)
Chloride: 105 mEq/L (ref 98–109)
Creatinine: 0.7 mg/dL (ref 0.6–1.1)
EGFR: >90 mL/min/{1.73_m2} (ref >90)
Glucose: 205 mg/dl — ABNORMAL HIGH (ref 70–140)
Potassium: 4.5 mEq/L (ref 3.5–5.1)
Sodium: 142 mEq/L (ref 136–145)
Total Bilirubin: 0.53 mg/dL (ref 0.20–1.20)
Total Protein: 6.8 g/dL (ref 6.4–8.3)

## 2015-01-23 MED ORDER — LETROZOLE 2.5 MG PO TABS: ORAL_TABLET | ORAL | Status: DC

## 2015-01-23 NOTE — Progress Notes (Signed)
[place]  [phone] [phone]) [phone]     ID: [name] [name] OB: Sep 11, 1950  MR#: [id number]  FVC#:[id number]  PCP: [name], MD GYN:  Crawford Givens SU: Fanny Skates OTHER MD: Crissie Reese, Daneen Schick  CHIEF COMPLAINT: bilateral breast cancers CURRENT TREATMENT: letrozole  BREAST CANCER HISTORY: From Dr. Dana Allan original intake note, 04/27/2012:  "[name] [name] is a 64 y.o. female. With medical history significant for diabetes, heart murmurs, hiatal hernia. Patient's labs normal mammogram was 2 years ago. This year she went on to have a screening mammogram performed that showed 2 areas of concern on the left side.calcifications were noted in the left. In the right breast a possible mass warrants in further evaluation.  On 03/29/2012 patient underwent a bilateral diagnostic mammogram and right breast ultrasound. A spot magnification images demonstrate suspicious group of pleomorphic microcalcifications over the outer lower left breast with additional suspicious group of pleomorphic microcalcifications over the outer midportion of the left periareolar region. Spot compression images of the right breast demonstrate persistence of a 1 cm density at the edge of the film in the deep third of the right inner breast. Ultrasound performed showed no focal abnormality over the entire in her right breast to correspond to the mammographic density. Patient was recommended stereotactic core needle biopsy of the 2 groups of suspicious left breast microcalcifications. Because patient and her husband wanted bilateral mastectomies MRI was not performed.on 04/15/2012 patient had needle core biopsy performed of the 2 areas in the left breast. The left needle core biopsy in the lower breast revealed ductal carcinoma in situ with associated comedo necrosis and calcifications with the in situ carcinoma. It was ER +100% PR +12%. 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 Tumor was ER +100% PR +11% proliferation marker Ki-67 elevated at 80% HER-2/neu showed amplification with a ratio of 6.50. Patient was seen by Dr. Fanny Skates. Patient and her family desire bilateral mastectomies with immediate reconstruction. She is now seen in medical oncology for discussion of adjuvant therapy since patient does have a HER-2 positive ER positive breast cancer. Overall she's doing well she is accompanied by her husband and she is without any complaints. Her case was discussed at the multidisciplinary breast conference. Pathology and radiology were reviewed."  Her subsequent history is detailed below  INTERVAL HISTORY: [name] returns today for follow-up of her breast cancer, accompanied by her husband. The interval history is remarkable for palpating non-tender "lumps" in her left breast tissue, starting in September. This was accompanied by itching to her left arm. She denied rash, swelling, redness, or warmth. She called this facility in early October to make an appointment, but was unable to do so, and became frustrated with the attempts. She visited her PCP in November, who ordered a left breast/axillary ultrasound that was negative for malignancy. This came at a great relief to the patient, but she still expresses discontent that she was unable to reach a conclusion for an appointment with this office. Today's visit was previously scheduled at her last visit in June, so she decided hold off until today to formally express her grievance. The lumps and itching have subsided at this point.  REVIEW OF SYSTEMS: [name] has been on letrozole since November 2014 and is tolerating it reasonably well. She has mild hot flashes and vaginal dryness for which she uses water based lubricants. She continues to have joint aches and stiffness in  the mornings, but this is no worse than previously described. She wants to lose  weight and has been frustrated with this process, but has not consistent with diet or exercise habits. A detailed review of systems is otherwise stable  PAST MEDICAL HISTORY: Past Medical History  Diagnosis Date  . Heart murmur   . Diabetes mellitus without complication   . Osteopenia   . Cancer     breast  . Bronchitis   . Pneumonia   . Hiatal hernia   . Sinus problem   . Diabetes mellitus   . Breast cancer 04/15/12    left-biopsy  . Breast cancer, right breast 05/14/12    right mastectomy  . CHF with unknown LVEF 03/09/2013  . Chemotherapy-induced cardiomyopathy     PAST SURGICAL HISTORY: Past Surgical History  Procedure Laterality Date  . Cesarean section  1981, 1983  . Tummy tuck  1999  . Tonsillectomy    . Foot surgery    . Gastric bypass    . Wound debridement  01/14/2011    Procedure: DEBRIDEMENT ABDOMINAL WOUND;  Surgeon: Adin Hector, MD;  Location: [facility];  Service: General;  Laterality: N/A;  wound debridement and debridement on the abdomen  . Cosmetic surgery    . Total mastectomy Bilateral 05/14/2012    Procedure: TOTAL MASTECTOMY;  Surgeon: Adin Hector, MD;  Location: [facility];  Service: General;  Laterality: Bilateral;  . Axillary sentinel node biopsy Left 05/14/2012    Procedure: AXILLARY SENTINEL NODE BIOPSY;  Surgeon: Adin Hector, MD;  Location: [facility];  Service: General;  Laterality: Left;  . Portacath placement N/A 05/14/2012    Procedure: INSERTION PORT-A-CATH;  Surgeon: Adin Hector, MD;  Location: [facility];  Service: General;  Laterality: N/A;  . Breast reconstruction with placement of tissue expander and flex hd (acellular hydrated dermis) Bilateral 05/14/2012    Procedure: BREAST RECONSTRUCTION WITH PLACEMENT OF TISSUE EXPANDER AND FLEX HD (ACELLULAR HYDRATED DERMIS);  Surgeon: Crissie Reese, MD;  Location: [facility];  Service: Plastics;  Laterality: Bilateral;  . Bunionectomy    . Port-a-cath removal Right 09/12/2013    Procedure:  REMOVAL PORT-A-CATH;  Surgeon: Adin Hector, MD;  Location: [facility];  Service: General;  Laterality: Right;    FAMILY HISTORY Family History  Problem Relation Age of Onset  . Heart attack Father   . Diabetes Father   . Heart attack Brother   . Diabetes Brother   . Diabetes Sister   . Breast cancer Sister 45  . Diabetes Mother   . Breast cancer Mother 41  . Parkinson's disease Sister   . Heart attack Brother    the patient's father died 2 days after prostate cancer surgery, possibly from a blood clot. The patient's mother lived to be 24 years old. She had been diagnosed with breast cancer around the age of 70. The patient has 3 brothers, 4 sisters. One sister was diagnosed with breast cancer at the age of 60. There is no history of ovarian cancer in the family. The patient underwent genetic testing October 2014, with a VUS as the only finding  GYNECOLOGIC HISTORY:  Menarche age 67, first live birth age 11, the patient is [place] P2. She went through the change of life approximately age 25. 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 He is Broadus John "Joe" Burkle, who is a former judge and currently a  trial lawyer. The patient's daughter Garnette Scheuermann is an attorney in [place]. The patient's son Broadus John completed a Masters in Librarian, academic in [place]. The patient has 2 granddaughters.    ADVANCED DIRECTIVES: in place   HEALTH MAINTENANCE: Social History  Substance Use Topics  . Smoking status: Former Research scientist (life sciences)  . Smokeless tobacco: Never Used  . Alcohol Use: 1.2 - 1.8 oz/week    2-3 Glasses of wine per week     Colonoscopy:  PAP:  Bone density: January 2015  Lipid panel:  Allergies  Allergen Reactions  . Darvon Hives and Itching    All over body  . Penicillins Hives and Itching    All over body    Current Outpatient Prescriptions  Medication Sig Dispense Refill  .  Ascorbic Acid (VITAMIN C) 500 MG tablet Take 500 mg by mouth daily.      . Biotin 10 MG TABS Take by mouth. 4000 mcg daily    . Calcium Citrate (CITRACAL PO) Take by mouth.    . Cholecalciferol (VITAMIN D PO) Take 1 tablet by mouth daily.    Mariane Baumgarten Sodium (COLACE PO) Take by mouth as needed.     Marland Kitchen KRILL OIL PO Take by mouth.    . Multiple Vitamin (MULTIVITAMIN PO) Take 1 tablet by mouth daily.     . rosuvastatin (CRESTOR) 5 MG tablet Take 1 tablet (5 mg total) by mouth daily at 6 PM. 30 tablet 6  . sitaGLIPtin (JANUVIA) 100 MG tablet Take 100 mg by mouth daily.    Marland Kitchen UNABLE TO FIND Fiber Gummies. Pt takes 3 gummies daily by mouth.    . letrozole (FEMARA) 2.5 MG tablet TAKE 1 TABLET (2.5 MG TOTAL) BY MOUTH DAILY. 90 tablet 2   No current facility-administered medications for this visit.    OBJECTIVE: Middle-aged [place] American woman in no acute distress Filed Vitals:   01/23/15 0926  BP: 160/69  Pulse: 86  Temp: 98.2 F (36.8 C)  Resp: 20     Body mass index is 34.26 kg/(m^2).    ECOG FS:1 - Symptomatic but completely ambulatory  Skin: warm, dry  HEENT: sclerae anicteric, conjunctivae pink, oropharynx clear. No thrush or mucositis.  Lymph Nodes: No cervical or supraclavicular lymphadenopathy  Lungs: clear to auscultation bilaterally, no rales, wheezes, or rhonci  Heart: regular rate and rhythm  Abdomen: round, soft, non tender, positive bowel sounds  Musculoskeletal: No focal spinal tenderness, no peripheral edema  Neuro: non focal, well oriented, frustrated affect  Breasts: bilateral breasts status post mastectomies with implant reconstruction. No evidence of recurrent disease. Bilateral axillae benign.  LAB RESULTS:  CMP     Component Value Date/Time   NA 142 01/23/2015 0904   NA 144 07/14/2013 1516   K 4.5 01/23/2015 0904   K 4.3 07/14/2013 1516   CL 104 07/14/2013 1516   CL 104 07/30/2012 0955   CO2 27 01/23/2015 0904   CO2 32 07/14/2013 1516   GLUCOSE 205*  01/23/2015 0904   GLUCOSE 117* 07/14/2013 1516   GLUCOSE 138* 07/30/2012 0955   BUN 9.4 01/23/2015 0904   BUN 8 07/14/2013 1516   CREATININE 0.7 01/23/2015 0904   CREATININE 0.7 07/14/2013 1516   CALCIUM 9.8 01/23/2015 0904   CALCIUM 9.9 07/14/2013 1516   PROT 6.8 01/23/2015 0904   PROT 7.4 05/10/2012 1440   ALBUMIN 3.5 01/23/2015 0904   ALBUMIN 4.1 05/10/2012 1440   AST 18 01/23/2015 0904   AST 25 05/10/2012 1440  ALT 15 01/23/2015 0904   ALT 17 05/10/2012 1440   ALKPHOS 106 01/23/2015 0904   ALKPHOS 103 05/10/2012 1440   BILITOT 0.53 01/23/2015 0904   BILITOT 0.4 05/10/2012 1440   GFRNONAA >90 05/17/2012 0530   GFRAA >90 05/17/2012 0530    I No results found for: SPEP  Lab Results  Component Value Date   WBC 4.0 01/23/2015   NEUTROABS 2.3 01/23/2015   HGB 13.2 01/23/2015   HCT 40.1 01/23/2015   MCV 87.3 01/23/2015   PLT 272 01/23/2015      Chemistry      Component Value Date/Time   NA 142 01/23/2015 0904   NA 144 07/14/2013 1516   K 4.5 01/23/2015 0904   K 4.3 07/14/2013 1516   CL 104 07/14/2013 1516   CL 104 07/30/2012 0955   CO2 27 01/23/2015 0904   CO2 32 07/14/2013 1516   BUN 9.4 01/23/2015 0904   BUN 8 07/14/2013 1516   CREATININE 0.7 01/23/2015 0904   CREATININE 0.7 07/14/2013 1516      Component Value Date/Time   CALCIUM 9.8 01/23/2015 0904   CALCIUM 9.9 07/14/2013 1516   ALKPHOS 106 01/23/2015 0904   ALKPHOS 103 05/10/2012 1440   AST 18 01/23/2015 0904   AST 25 05/10/2012 1440   ALT 15 01/23/2015 0904   ALT 17 05/10/2012 1440   BILITOT 0.53 01/23/2015 0904   BILITOT 0.4 05/10/2012 1440       Lab Results  Component Value Date   LABCA2 9 05/10/2012    No components found for: [id number]  No results for input(s): INR in the last 168 hours.  Urinalysis    Component Value Date/Time   COLORURINE YELLOW 05/10/2012 1444   APPEARANCEUR CLEAR 05/10/2012 1444   LABSPEC 1.009 05/10/2012 1444   PHURINE 7.0 05/10/2012 1444   GLUCOSEU  NEGATIVE 05/10/2012 1444   HGBUR NEGATIVE 05/10/2012 1444   BILIRUBINUR NEGATIVE 05/10/2012 1444   KETONESUR NEGATIVE 05/10/2012 1444   PROTEINUR NEGATIVE 05/10/2012 1444   UROBILINOGEN 0.2 05/10/2012 1444   NITRITE NEGATIVE 05/10/2012 1444   LEUKOCYTESUR MODERATE* 05/10/2012 1444    STUDIES:  US Breast Ltd Uni Left Inc Axilla  01/04/2015  CLINICAL DATA:  Two or three small, rounded masses felt by the patient in the upper left breast for the past 2 months, unchanged in size during that time. Status post bilateral mastectomies and implant reconstruction in 2014. EXAM: ULTRASOUND OF THE LEFT BREAST COMPARISON:  Previous exam(s). FINDINGS: On physical exam, the patient has 2 small, rounded palpable masses in the upper left breast, superior to the implant, centered in the 12 o'clock position. Targeted ultrasound is performed, showing Two 8 mm normal appearing intramammary lymph nodes in the 12 o'clock position of the left breast, 7 cm from the nipple, at the location of palpable nodularity. There are also 3 ill-defined areas of increased echogenicity within the fat lobules in that portion of the breast. All 3 of these measure 1.2 cm in maximum diameter each. Two of these contain eccentric cystic areas. IMPRESSION: Normal lymph nodes and areas of fat necrosis and oil cyst formation in the upper left breast, corresponding to the palpable nodularity felt by the patient. No evidence of malignancy. RECOMMENDATION: Clinical followup. I have discussed the findings and recommendations with the patient. Results were also provided in writing at the conclusion of the visit. If applicable, a reminder letter will be sent to the patient regarding the next appointment. BI-RADS CATEGORY  2: Benign. Electronically  Signed   By: Claudie Revering M.D.   On: 01/04/2015 14:28    ASSESSMENT: 64 y.o. BRCA negative [place] woman status post bilateral mastectomies 05/14/2012  (a) on the left, mpT1c pN0, stage IA invasive ductal  carcinoma, grade 2, estrogen receptor 100% positive, progesterone receptor 11% positive, with an MIB-1 of 80%, and HER-2 amplified, the ratio being 6.50.  (b) on the right ,pT1c pN0, stage IA invasive ductal carcinoma, grade 3, triple negative, with an MIB-1 of 92%   (1) treated adjuvantly with doxorubicin and cyclophosphamide in dose dense fashion x4, completed 08/13/2012, followed by weekly Abraxane x12 given with concurrent trastuzumab, completed 12/03/2012  (2) received trastuzumab between 08/27/2012 and 06/29/2013, discontinued because of concerns regarding weakening of the heart muscle  (a) echo 07/25/2013 showed an ejection fraction of 55-60%  (3) letrozole started November 2014; with fatigue and multiple arthralgias/myalgias, letrozole was held for 6 weeks with no improvement in symptoms, and so it was resumed.   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 There were no deleterious mutations in the genetic panel tested, which included ATM, BARD1, BRCA1, BRCA2, BRIP1, CDH1, CHEK2, EPCAM, FANCC, MLH1, MSH2, MSH6, NBN, PALB2, PMS2, PTEN, RAD51C, RAD51D, STK11, TP53, and XRCC2   PLAN: Dr. Jana Hakim examined the patient and there were no suspicious areas palpated. He reviewed the results of her left ultrasound. This showed normal lymph nodes with areas of fat necrosis and oil cyst formation in the left upper breast, all benign findings. The patient of course was notified by her PCP at the time of the results, so this was not news to her. She was more concerned about feeling secure that in the future she will be able to make an appointment in a timely manner, should she find something new or suspicious. We reviewed the chart and it seems triage managed her phone call correctly within the first 24 hours of her call, but  somewhere between scheduling and being able to reach the patient via phone, there was a miscommunication. In any case, the patient was pleased to have other phone number given to her in order to reach [place] in a more direct manner.   The patient had additional questions regarding weight loss, specifically a program by the name of [facility] that utilizes hCG. Dr. Jana Hakim and myself were unaware of the specifics of the program, and thus were unable to give an immediate recommendation, but we will get back to her with what we find out. In the meantime, Dr. Jana Hakim reminded her that a low carb diet complimented with regularly exercise was her best bet for safe results.   [name] will continue on letrozole daily. She will return in 6 months for labs and a follow up visit with Dr. Jana Hakim. She understands and agrees with this plan. She knows the goal of treatment in her case is cure. She has been encouraged to call with any issues that might arise before her next visit here.    Laurie Panda, NP   01/23/2015 3:52 PM    ADDENDUM: It was distressing to me that [name] had such a trouble getting through. Looking at the record she called [place] on October 10, and we immediately placed a by mouth F with a visit to be scheduled the next day. [name] was called, but there was no answer  and no possibility of leaving a phone message (the message been was fall).  That is as far as it went. She never called [place] back and we apparently did not make another attempt at calling her. I have brought this up with our scheduler supervisor to find out what the policy is when we are unable to reach the patient by phone.  I did assure her [name] that she is fine and just in case she has trouble in the future when I had an gave her my beeper number and myself number.  The plan is to continue letrozole for a total of 5 years. She will be due for repeat bone density early in this coming year and I will get that arranged  for her.  I personally saw this patient and performed a substantive portion of this encounter with the listed APP documented above.   Chauncey Cruel, MD Medical Oncology and Hematology [facility] [address] [place], [place] [address] Tel. [phone]    Fax. [phone]

## 2015-01-23 NOTE — Telephone Encounter (Signed)
Appointments made and avs printed for patient °

## 2015-01-26 ENCOUNTER — Other Ambulatory Visit: Payer: Self-pay | Admitting: Nurse Practitioner

## 2015-03-23 DIAGNOSIS — M791 Myalgia: Secondary | ICD-10-CM | POA: Diagnosis not present

## 2015-03-23 DIAGNOSIS — Z79899 Other long term (current) drug therapy: Secondary | ICD-10-CM | POA: Diagnosis not present

## 2015-03-23 DIAGNOSIS — E785 Hyperlipidemia, unspecified: Secondary | ICD-10-CM | POA: Diagnosis not present

## 2015-03-23 DIAGNOSIS — E1165 Type 2 diabetes mellitus with hyperglycemia: Secondary | ICD-10-CM | POA: Diagnosis not present

## 2015-04-17 DIAGNOSIS — Z79899 Other long term (current) drug therapy: Secondary | ICD-10-CM | POA: Diagnosis not present

## 2015-06-05 DIAGNOSIS — M791 Myalgia: Secondary | ICD-10-CM | POA: Diagnosis not present

## 2015-06-05 DIAGNOSIS — M79601 Pain in right arm: Secondary | ICD-10-CM | POA: Diagnosis not present

## 2015-07-19 DIAGNOSIS — Z6833 Body mass index (BMI) 33.0-33.9, adult: Secondary | ICD-10-CM | POA: Diagnosis not present

## 2015-07-19 DIAGNOSIS — J00 Acute nasopharyngitis [common cold]: Secondary | ICD-10-CM | POA: Diagnosis not present

## 2015-07-19 DIAGNOSIS — E1165 Type 2 diabetes mellitus with hyperglycemia: Secondary | ICD-10-CM | POA: Diagnosis not present

## 2015-07-24 ENCOUNTER — Other Ambulatory Visit: Payer: Self-pay | Admitting: *Deleted

## 2015-07-24 DIAGNOSIS — C50512 Malignant neoplasm of lower-outer quadrant of left female breast: Secondary | ICD-10-CM

## 2015-07-25 ENCOUNTER — Telehealth: Payer: Self-pay | Admitting: Oncology

## 2015-07-25 ENCOUNTER — Other Ambulatory Visit (HOSPITAL_BASED_OUTPATIENT_CLINIC_OR_DEPARTMENT_OTHER): Payer: Medicare Other

## 2015-07-25 ENCOUNTER — Ambulatory Visit (HOSPITAL_BASED_OUTPATIENT_CLINIC_OR_DEPARTMENT_OTHER): Payer: Medicare Other | Admitting: Oncology

## 2015-07-25 VITALS — BP 148/59 | HR 81 | Temp 98.0°F | Resp 18 | Ht 64.0 in | Wt 187.4 lb

## 2015-07-25 DIAGNOSIS — C50912 Malignant neoplasm of unspecified site of left female breast: Secondary | ICD-10-CM

## 2015-07-25 DIAGNOSIS — Z79811 Long term (current) use of aromatase inhibitors: Secondary | ICD-10-CM

## 2015-07-25 DIAGNOSIS — C50911 Malignant neoplasm of unspecified site of right female breast: Secondary | ICD-10-CM

## 2015-07-25 DIAGNOSIS — M8580 Other specified disorders of bone density and structure, unspecified site: Secondary | ICD-10-CM | POA: Diagnosis not present

## 2015-07-25 DIAGNOSIS — C50512 Malignant neoplasm of lower-outer quadrant of left female breast: Secondary | ICD-10-CM

## 2015-07-25 LAB — CBC WITH DIFFERENTIAL/PLATELET
BASO%: 0.2 % (ref 0.0–2.0)
Basophils Absolute: 0 10*3/uL (ref 0.0–0.1)
EOS%: 0.6 % (ref 0.0–7.0)
Eosinophils Absolute: 0 10*3/uL (ref 0.0–0.5)
HCT: 41.8 % (ref 34.8–46.6)
HGB: 13.9 g/dL (ref 11.6–15.9)
LYMPH%: 22.6 % (ref 14.0–49.7)
MCH: 29.3 pg (ref 25.1–34.0)
MCHC: 33.3 g/dL (ref 31.5–36.0)
MCV: 88 fL (ref 79.5–101.0)
MONO#: 0.5 10*3/uL (ref 0.1–0.9)
MONO%: 8.1 % (ref 0.0–14.0)
NEUT#: 4.4 10*3/uL (ref 1.5–6.5)
NEUT%: 68.5 % (ref 38.4–76.8)
Platelets: 301 10*3/uL (ref 145–400)
RBC: 4.75 10*6/uL (ref 3.70–5.45)
RDW: 12.9 % (ref 11.2–14.5)
WBC: 6.5 10*3/uL (ref 3.9–10.3)
lymph#: 1.5 10*3/uL (ref 0.9–3.3)

## 2015-07-25 LAB — COMPREHENSIVE METABOLIC PANEL
ALT: 22 U/L (ref 0–55)
AST: 22 U/L (ref 5–34)
Albumin: 3.6 g/dL (ref 3.5–5.0)
Alkaline Phosphatase: 95 U/L (ref 40–150)
Anion Gap: 9 mEq/L (ref 3–11)
BUN: 12.8 mg/dL (ref 7.0–26.0)
CO2: 29 mEq/L (ref 22–29)
Calcium: 10.1 mg/dL (ref 8.4–10.4)
Chloride: 106 mEq/L (ref 98–109)
Creatinine: 0.7 mg/dL (ref 0.6–1.1)
EGFR: >90 mL/min/{1.73_m2} (ref >90)
Glucose: 97 mg/dl (ref 70–140)
Potassium: 4.8 mEq/L (ref 3.5–5.1)
Sodium: 144 mEq/L (ref 136–145)
Total Bilirubin: <0.3 mg/dL (ref 0.20–1.20)
Total Protein: 7.1 g/dL (ref 6.4–8.3)

## 2015-07-25 MED ORDER — LETROZOLE 2.5 MG PO TABS: ORAL_TABLET | ORAL | Status: DC

## 2015-07-25 NOTE — Telephone Encounter (Signed)
Gave and printed appt sched and avs fo rpt; for NOV  °

## 2015-07-25 NOTE — Progress Notes (Signed)
[place]  [phone]) [phone] [phone]) [phone]     ID: [name] G Gammel OB: 05/29/1950  MR#: [id number]  GEZ#:[id number]  PCP: [name], MD GYN:  Crawford Givens SU: Fanny Skates OTHER MD: Crissie Reese, Daneen Schick  CHIEF COMPLAINT: bilateral breast cancers CURRENT TREATMENT: letrozole  BREAST CANCER HISTORY: From Dr. Dana Allan original intake note, 04/27/2012:  "[name] [name] is a 65 y.o. female. With medical history significant for diabetes, heart murmurs, hiatal hernia. Patient's labs normal mammogram was 2 years ago. This year she went on to have a screening mammogram performed that showed 2 areas of concern on the left side.calcifications were noted in the left. In the right breast a possible mass warrants in further evaluation.  On 03/29/2012 patient underwent a bilateral diagnostic mammogram and right breast ultrasound. A spot magnification images demonstrate suspicious group of pleomorphic microcalcifications over the outer lower left breast with additional suspicious group of pleomorphic microcalcifications over the outer midportion of the left periareolar region. Spot compression images of the right breast demonstrate persistence of a 1 cm density at the edge of the film in the deep third of the right inner breast. Ultrasound performed showed no focal abnormality over the entire in her right breast to correspond to the mammographic density. Patient was recommended stereotactic core needle biopsy of the 2 groups of suspicious left breast microcalcifications. Because patient and her husband wanted bilateral mastectomies MRI was not performed.on 04/15/2012 patient had needle core biopsy performed of the 2 areas in the left breast. The left needle core biopsy in the lower breast revealed ductal carcinoma in situ with associated comedo necrosis and calcifications with the in situ carcinoma. It was ER +100% PR +12%. 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 Tumor was ER +100% PR +11% proliferation marker Ki-67 elevated at 80% HER-2/neu showed amplification with a ratio of 6.50. Patient was seen by Dr. Fanny Skates. Patient and her family desire bilateral mastectomies with immediate reconstruction. She is now seen in medical oncology for discussion of adjuvant therapy since patient does have a HER-2 positive ER positive breast cancer. Overall she's doing well she is accompanied by her husband and she is without any complaints. Her case was discussed at the multidisciplinary breast conference. Pathology and radiology were reviewed."  Her subsequent history is detailed below  INTERVAL HISTORY: [name] returns today for follow-up of her estrogen receptor positive breast cancer, accompanied by her husband, [name]. She continues on letrozole, with good tolerance. She has occasional hot flashes which are not a major problem. She obtains it at a good price.  REVIEW OF SYSTEMS: [name] has been having some pain in the right elbow and right shoulder was sometimes the right wrist. There has been no swelling or erythema. Her husband tells me she's been picking up her 60 and 78-year-old grandchildren "a lot". Occasionally she has heartburn problems. She does not find that omeprazole is very helpful. She does have other pains here and there but these are not more intense or persistent than before. A detailed review of systems today was otherwise stable.  PAST MEDICAL HISTORY: Past Medical History  Diagnosis Date  . Heart murmur   . Diabetes mellitus without complication ([place])   . Osteopenia   . Cancer (HCC)     breast  . Bronchitis   . Pneumonia   . Hiatal hernia   . Sinus problem   . Diabetes mellitus   .  Breast cancer ([place]) 04/15/12    left-biopsy  . Breast cancer, right breast ([place]) 05/14/12    right mastectomy  . CHF with unknown LVEF ([place]) 03/09/2013  . 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 Cesarean section  1981, 1983  . Tummy tuck  1999  . Tonsillectomy    . Foot surgery    . Gastric bypass    . Wound debridement  01/14/2011    Procedure: DEBRIDEMENT ABDOMINAL WOUND;  Surgeon: Adin Hector, MD;  Location: [facility];  Service: General;  Laterality: N/A;  wound debridement and debridement on the abdomen  . Cosmetic surgery    . Total mastectomy Bilateral 05/14/2012    Procedure: TOTAL MASTECTOMY;  Surgeon: Adin Hector, MD;  Location: [facility];  Service: General;  Laterality: Bilateral;  . Axillary sentinel node biopsy Left 05/14/2012    Procedure: AXILLARY SENTINEL NODE BIOPSY;  Surgeon: Adin Hector, MD;  Location: [facility];  Service: General;  Laterality: Left;  . Portacath placement N/A 05/14/2012    Procedure: INSERTION PORT-A-CATH;  Surgeon: Adin Hector, MD;  Location: [facility];  Service: General;  Laterality: N/A;  . Breast reconstruction with placement of tissue expander and flex hd (acellular hydrated dermis) Bilateral 05/14/2012    Procedure: BREAST RECONSTRUCTION WITH PLACEMENT OF TISSUE EXPANDER AND FLEX HD (ACELLULAR HYDRATED DERMIS);  Surgeon: Crissie Reese, MD;  Location: [facility];  Service: Plastics;  Laterality: Bilateral;  . Bunionectomy    . Port-a-cath removal Right 09/12/2013    Procedure: REMOVAL PORT-A-CATH;  Surgeon: Adin Hector, MD;  Location: [facility];  Service: General;  Laterality: Right;    FAMILY HISTORY Family History  Problem Relation Age of Onset  . Heart attack Father   . Diabetes Father   . Heart attack Brother   . Diabetes Brother   . Diabetes Sister   . Breast cancer Sister 1  . Diabetes Mother   . Breast cancer Mother 59  . Parkinson's disease Sister   . Heart attack Brother    the patient's father died 2 days after prostate cancer surgery, possibly from a blood clot. The patient's mother  lived to be 75 years old. She had been diagnosed with breast cancer around the age of 61. The patient has 3 brothers, 4 sisters. One sister was diagnosed with breast cancer at the age of 33. There is no history of ovarian cancer in the family. The patient underwent genetic testing October 2014, with a VUS as the only finding  GYNECOLOGIC HISTORY:  Menarche age 60, first live birth age 22, the patient is [place] P2. She went through the change of life approximately age 24. 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 He is Broadus John "Joe" Nuccio, who is a former judge and currently a Architectural technologist. The patient's daughter Garnette Scheuermann is an attorney in [place]. The patient's son Broadus John completed a Masters in Librarian, academic in [place]. The patient has 2 granddaughters.    ADVANCED DIRECTIVES: in place   HEALTH MAINTENANCE: Social History  Substance Use Topics  . Smoking status: Former Research scientist (life sciences)  . Smokeless tobacco: Never Used  . Alcohol Use: 1.2 - 1.8 oz/week    2-3 Glasses of wine per week     Colonoscopy:  PAP:  Bone density: January 2015  Lipid panel:  Allergies  Allergen Reactions  . Darvon Hives and Itching    All over body  . Penicillins Hives and Itching    All over body    Current Outpatient Prescriptions  Medication Sig Dispense Refill  . Ascorbic Acid (VITAMIN C) 500 MG tablet Take 500 mg by mouth daily.      . Biotin 10 MG TABS Take by mouth. 4000 mcg daily    . Calcium Citrate (CITRACAL PO) Take by mouth.    . Cholecalciferol (VITAMIN D PO) Take 1 tablet by mouth daily.    Mariane Baumgarten Sodium (COLACE PO) Take by mouth as needed.     Marland Kitchen KRILL OIL PO Take by mouth.    . letrozole (FEMARA) 2.5 MG tablet TAKE 1 TABLET (2.5 MG TOTAL) BY MOUTH DAILY. 90 tablet 2  . Multiple Vitamin (MULTIVITAMIN PO) Take 1 tablet by mouth daily.     . rosuvastatin (CRESTOR) 5 MG tablet Take 1 tablet (5 mg  total) by mouth daily at 6 PM. 30 tablet 6  . sitaGLIPtin (JANUVIA) 100 MG tablet Take 100 mg by mouth daily.    Marland Kitchen UNABLE TO FIND Fiber Gummies. Pt takes 3 gummies daily by mouth.     No current facility-administered medications for this visit.    OBJECTIVE: Middle-aged [place] American woman Who appears well  Filed Vitals:   07/25/15 1244  BP: 148/59  Pulse: 81  Temp: 98 F (36.7 C)  Resp: 18     Body mass index is 32.15 kg/(m^2).    ECOG FS:1 - Symptomatic but completely ambulatory  Sclerae unicteric, EOMs intact Oropharynx clear, dentition in good repair No cervical or supraclavicular adenopathy Lungs no rales or rhonchi Heart regular rate and rhythm Abd soft, nontender, positive bowel sounds MSK no focal spinal tenderness, no upper extremity lymphedema Neuro: nonfocal, well oriented, appropriate affect Breasts: Status post bilateral mastectomies. There are bilateral implants in place. There is no evidence of chest wall recurrence. Both axillae are benign.  LAB RESULTS:  CMP     Component Value Date/Time   NA 142 01/23/2015 0904   NA 144 07/14/2013 1516   K 4.5 01/23/2015 0904   K 4.3 07/14/2013 1516   CL 104 07/14/2013 1516   CL 104 07/30/2012 0955   CO2 27 01/23/2015 0904   CO2 32 07/14/2013 1516   GLUCOSE 205* 01/23/2015 0904   GLUCOSE 117* 07/14/2013 1516   GLUCOSE 138* 07/30/2012 0955   BUN 9.4 01/23/2015 0904   BUN 8 07/14/2013 1516   CREATININE 0.7 01/23/2015 0904   CREATININE 0.7 07/14/2013 1516   CALCIUM 9.8 01/23/2015 0904   CALCIUM 9.9 07/14/2013 1516   PROT 6.8 01/23/2015 0904   PROT 7.4 05/10/2012 1440   ALBUMIN 3.5 01/23/2015 0904   ALBUMIN 4.1 05/10/2012 1440   AST 18 01/23/2015 0904   AST 25 05/10/2012 1440   ALT 15 01/23/2015 0904   ALT 17 05/10/2012 1440   ALKPHOS 106 01/23/2015 0904   ALKPHOS 103 05/10/2012 1440   BILITOT 0.53 01/23/2015 0904   BILITOT 0.4 05/10/2012 1440   GFRNONAA >90 05/17/2012 0530   GFRAA >90 05/17/2012 0530     I No results found for: SPEP  Lab Results  Component Value Date   WBC 6.5 07/25/2015   NEUTROABS 4.4 07/25/2015   HGB 13.9 07/25/2015   HCT 41.8 07/25/2015   MCV 88.0 07/25/2015   PLT 301 07/25/2015      Chemistry      Component Value Date/Time  NA 142 01/23/2015 0904   NA 144 07/14/2013 1516   K 4.5 01/23/2015 0904   K 4.3 07/14/2013 1516   CL 104 07/14/2013 1516   CL 104 07/30/2012 0955   CO2 27 01/23/2015 0904   CO2 32 07/14/2013 1516   BUN 9.4 01/23/2015 0904   BUN 8 07/14/2013 1516   CREATININE 0.7 01/23/2015 0904   CREATININE 0.7 07/14/2013 1516      Component Value Date/Time   CALCIUM 9.8 01/23/2015 0904   CALCIUM 9.9 07/14/2013 1516   ALKPHOS 106 01/23/2015 0904   ALKPHOS 103 05/10/2012 1440   AST 18 01/23/2015 0904   AST 25 05/10/2012 1440   ALT 15 01/23/2015 0904   ALT 17 05/10/2012 1440   BILITOT 0.53 01/23/2015 0904   BILITOT 0.4 05/10/2012 1440       Lab Results  Component Value Date   LABCA2 9 05/10/2012    No components found for: [id number]  No results for input(s): INR in the last 168 hours.  Urinalysis    Component Value Date/Time   COLORURINE YELLOW 05/10/2012 1444   APPEARANCEUR CLEAR 05/10/2012 1444   LABSPEC 1.009 05/10/2012 1444   PHURINE 7.0 05/10/2012 1444   GLUCOSEU NEGATIVE 05/10/2012 1444   HGBUR NEGATIVE 05/10/2012 1444   BILIRUBINUR NEGATIVE 05/10/2012 1444   KETONESUR NEGATIVE 05/10/2012 1444   PROTEINUR NEGATIVE 05/10/2012 1444   UROBILINOGEN 0.2 05/10/2012 1444   NITRITE NEGATIVE 05/10/2012 1444   LEUKOCYTESUR MODERATE* 05/10/2012 1444    STUDIES:  No results found.  ASSESSMENT: 65 y.o. BRCA negative [place] woman status post bilateral mastectomies 05/14/2012  (a) on the left, mpT1c pN0, stage IA invasive ductal carcinoma, grade 2, estrogen receptor 100% positive, progesterone receptor 11% positive, with an MIB-1 of 80%, and HER-2 amplified, the ratio being 6.50.  (b) on the right ,pT1c pN0, stage IA  invasive ductal carcinoma, grade 3, triple negative, with an MIB-1 of 92%   (1) treated adjuvantly with doxorubicin and cyclophosphamide in dose dense fashion x4, completed 08/13/2012, followed by weekly Abraxane x12 given with concurrent trastuzumab, completed 12/03/2012  (2) received trastuzumab between 08/27/2012 and 06/29/2013, discontinued because of concerns regarding weakening of the heart muscle  (a) echo 07/25/2013 showed an ejection fraction of 55-60%  (3) letrozole started November 2014; with fatigue and multiple arthralgias/myalgias, letrozole was held for 6 weeks with no improvement in symptoms, and so it was resumed.   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 There were no deleterious mutations in the genetic panel tested, which included ATM, BARD1, BRCA1, BRCA2, BRIP1, CDH1, CHEK2, EPCAM, FANCC, MLH1, MSH2, MSH6, NBN, PALB2, PMS2, PTEN, RAD51C, RAD51D, STK11, TP53, and XRCC2   PLAN: [name] is now a little over 3 years out from definitive surgery for her breast cancer with no evidence of disease recurrence. This is very favorable.  I think she is having a little bit of tennis elbow. She could also be having some right shoulder bursitis. We discussed whether or not to do an MRI of the shoulder at this point. I think a better plan would be for her to maybe not pickup her grandchildren quite so often, so give that arm little bit of arrest, use ice as needed, and taken Aleve occasionally. If that doesn't take care of the problem in the next several weeks she will let us  know and I will set her up for an MRI of the shoulder.  She is due for repeat bone density and we will obtain that at [facility] sometime this month. I will call her with those results.  Otherwise the plan is to continue letrozole to a total of  5 years. She will see me in November (she sees Dr. Baird Cancer every May) and I will start seeing her on a once a year basis at that visit. Chauncey Cruel, MD   07/25/2015 1:02 PM      Chauncey Cruel, MD Medical Oncology and Hematology [facility] [address] [place], [place] [address] Tel. [phone]    Fax. [phone]

## 2015-07-25 NOTE — Telephone Encounter (Signed)
[name] [name] pt never been there pt last bone density was at [facility]...lvm for Val to change scan location

## 2015-08-02 DIAGNOSIS — M8589 Other specified disorders of bone density and structure, multiple sites: Secondary | ICD-10-CM | POA: Diagnosis not present

## 2015-08-23 ENCOUNTER — Encounter (HOSPITAL_COMMUNITY): Payer: Self-pay

## 2015-11-20 DIAGNOSIS — Z23 Encounter for immunization: Secondary | ICD-10-CM | POA: Diagnosis not present

## 2016-01-01 ENCOUNTER — Other Ambulatory Visit (HOSPITAL_BASED_OUTPATIENT_CLINIC_OR_DEPARTMENT_OTHER): Payer: Medicare Other

## 2016-01-01 ENCOUNTER — Ambulatory Visit (HOSPITAL_COMMUNITY)
Admission: RE | Admit: 2016-01-01 | Discharge: 2016-01-01 | Disposition: A | Discharge status: Home / Self Care | Payer: Medicare Other | Source: Ambulatory Visit | Attending: Oncology | Admitting: Oncology

## 2016-01-01 ENCOUNTER — Ambulatory Visit (HOSPITAL_BASED_OUTPATIENT_CLINIC_OR_DEPARTMENT_OTHER): Payer: Medicare Other | Admitting: Oncology

## 2016-01-01 VITALS — BP 150/77 | HR 79 | Temp 98.7°F | Ht 64.0 in | Wt 192.9 lb

## 2016-01-01 DIAGNOSIS — C50911 Malignant neoplasm of unspecified site of right female breast: Secondary | ICD-10-CM

## 2016-01-01 DIAGNOSIS — M25551 Pain in right hip: Secondary | ICD-10-CM | POA: Insufficient documentation

## 2016-01-01 DIAGNOSIS — Z170 Estrogen receptor positive status [ER+]: Secondary | ICD-10-CM

## 2016-01-01 DIAGNOSIS — C50912 Malignant neoplasm of unspecified site of left female breast: Secondary | ICD-10-CM | POA: Diagnosis not present

## 2016-01-01 DIAGNOSIS — E559 Vitamin D deficiency, unspecified: Secondary | ICD-10-CM | POA: Diagnosis not present

## 2016-01-01 DIAGNOSIS — M1611 Unilateral primary osteoarthritis, right hip: Secondary | ICD-10-CM | POA: Diagnosis not present

## 2016-01-01 DIAGNOSIS — M8580 Other specified disorders of bone density and structure, unspecified site: Secondary | ICD-10-CM

## 2016-01-01 DIAGNOSIS — Z79811 Long term (current) use of aromatase inhibitors: Secondary | ICD-10-CM | POA: Diagnosis not present

## 2016-01-01 LAB — COMPREHENSIVE METABOLIC PANEL
ALT: 21 U/L (ref 0–55)
AST: 23 U/L (ref 5–34)
Albumin: 3.7 g/dL (ref 3.5–5.0)
Alkaline Phosphatase: 120 U/L (ref 40–150)
Anion Gap: 11 mEq/L (ref 3–11)
BUN: 8.9 mg/dL (ref 7.0–26.0)
CO2: 28 mEq/L (ref 22–29)
Calcium: 10.3 mg/dL (ref 8.4–10.4)
Chloride: 104 mEq/L (ref 98–109)
Creatinine: 0.7 mg/dL (ref 0.6–1.1)
EGFR: >90 mL/min/{1.73_m2} (ref >90)
Glucose: 99 mg/dl (ref 70–140)
Potassium: 3.7 mEq/L (ref 3.5–5.1)
Sodium: 143 mEq/L (ref 136–145)
Total Bilirubin: 0.49 mg/dL (ref 0.20–1.20)
Total Protein: 7.3 g/dL (ref 6.4–8.3)

## 2016-01-01 LAB — CBC WITH DIFFERENTIAL/PLATELET
BASO%: 0.4 % (ref 0.0–2.0)
Basophils Absolute: 0 10*3/uL (ref 0.0–0.1)
EOS%: 1 % (ref 0.0–7.0)
Eosinophils Absolute: 0.1 10*3/uL (ref 0.0–0.5)
HCT: 40.4 % (ref 34.8–46.6)
HGB: 13.1 g/dL (ref 11.6–15.9)
LYMPH%: 34.2 % (ref 14.0–49.7)
MCH: 28.1 pg (ref 25.1–34.0)
MCHC: 32.4 g/dL (ref 31.5–36.0)
MCV: 86.7 fL (ref 79.5–101.0)
MONO#: 0.5 10*3/uL (ref 0.1–0.9)
MONO%: 10.7 % (ref 0.0–14.0)
NEUT#: 2.6 10*3/uL (ref 1.5–6.5)
NEUT%: 53.7 % (ref 38.4–76.8)
Platelets: 301 10*3/uL (ref 145–400)
RBC: 4.66 10*6/uL (ref 3.70–5.45)
RDW: 13.1 % (ref 11.2–14.5)
WBC: 4.8 10*3/uL (ref 3.9–10.3)
lymph#: 1.6 10*3/uL (ref 0.9–3.3)

## 2016-01-01 NOTE — Progress Notes (Signed)
[facility]  [phone] [phone]     ID: [name] G Stjames OB: [birth year]  MR#: [id number]  OTL#:[id number]  PCP: [name], MD GYN:  Crawford Givens SU: Fanny Skates OTHER MD: Crissie Reese, Daneen Schick  CHIEF COMPLAINT: bilateral breast cancers CURRENT TREATMENT: letrozole  BREAST CANCER HISTORY: From Dr. Dana Allan original intake note, 04/27/2012:  "[name] [name] is a 65 y.o. female. With medical history significant for diabetes, heart murmurs, hiatal hernia. Patient's labs normal mammogram was 2 years ago. This year she went on to have a screening mammogram performed that showed 2 areas of concern on the left side.calcifications were noted in the left. In the right breast a possible mass warrants in further evaluation.  On 03/29/2012 patient underwent a bilateral diagnostic mammogram and right breast ultrasound. A spot magnification images demonstrate suspicious group of pleomorphic microcalcifications over the outer lower left breast with additional suspicious group of pleomorphic microcalcifications over the outer midportion of the left periareolar region. Spot compression images of the right breast demonstrate persistence of a 1 cm density at the edge of the film in the deep third of the right inner breast. Ultrasound performed showed no focal abnormality over the entire in her right breast to correspond to the mammographic density. Patient was recommended stereotactic core needle biopsy of the 2 groups of suspicious left breast microcalcifications. Because patient and her husband wanted bilateral mastectomies MRI was not performed.on 04/15/2012 patient had needle core biopsy performed of the 2 areas in the left breast. The left needle core biopsy in the lower breast revealed ductal carcinoma in situ with associated comedo necrosis and calcifications with the in situ carcinoma. It was ER +100% PR +12%. 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 Tumor was ER +100% PR +11% proliferation marker Ki-67 elevated at 80% HER-2/neu showed amplification with a ratio of 6.50. Patient was seen by Dr. Fanny Skates. Patient and her family desire bilateral mastectomies with immediate reconstruction. She is now seen in medical oncology for discussion of adjuvant therapy since patient does have a HER-2 positive ER positive breast cancer. Overall she's doing well she is accompanied by her husband and she is without any complaints. Her case was discussed at the multidisciplinary breast conference. Pathology and radiology were reviewed."  Her subsequent history is detailed below  INTERVAL HISTORY: [name] returns today for follow-up of her breast cancer, accompanied by her husband, [name] [name]. She continues on letrozole, with good tolerance. Hot flashes and vaginal dryness are not a major issue. She never developed the arthralgias or myalgias that many patients can experience on this medication. She obtains it at a good price.  REVIEW OF SYSTEMS: [name] is having problems "with my right side". Her right elbow hurts, her right hip hurts and sometimes her right thigh hurts, and her right ankle hurts. None of this is constant. It is not clear whether walking makes it better or worse. Flexeril sometimes helps. She tells me that she is known to have a bulging disc and wonders if this could possibly be related. She is not exercising regularly although she has access to a treadmill. A second problem is problems with heartburn. She takes 20 mg of Prilosec daily but that is not taking care of the problem. She feels forgetful but not anxious or depressed. She has hot flashes. Aside from these issues she is doing "pretty good". A detailed review of systems today was otherwise stable  PAST MEDICAL HISTORY: Past Medical History:  Diagnosis Date  . Breast cancer ([facility]) 04/15/12   left-biopsy  . Breast cancer,  right breast ([facility]) 05/14/12   right mastectomy  . Bronchitis   . Cancer ([place])    breast  . 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 CHF with unknown LVEF ([facility]) 03/09/2013  . Diabetes mellitus   . Diabetes mellitus without complication ([facility])   . Heart murmur   . Hiatal hernia   . Osteopenia   . Pneumonia   . Sinus problem     PAST SURGICAL HISTORY: Past Surgical History:  Procedure Laterality Date  . AXILLARY SENTINEL NODE BIOPSY Left 05/14/2012   Procedure: AXILLARY SENTINEL NODE BIOPSY;  Surgeon: Adin Hector, MD;  Location: [facility];  Service: General;  Laterality: Left;  . BREAST RECONSTRUCTION WITH PLACEMENT OF TISSUE EXPANDER AND FLEX HD (ACELLULAR HYDRATED DERMIS) Bilateral 05/14/2012   Procedure: BREAST RECONSTRUCTION WITH PLACEMENT OF TISSUE EXPANDER AND FLEX HD (ACELLULAR HYDRATED DERMIS);  Surgeon: Crissie Reese, MD;  Location: [facility];  Service: Plastics;  Laterality: Bilateral;  . BUNIONECTOMY    . [place]  . COSMETIC SURGERY    . FOOT SURGERY    . GASTRIC BYPASS    . PORT-A-CATH REMOVAL Right 09/12/2013   Procedure: REMOVAL PORT-A-CATH;  Surgeon: Adin Hector, MD;  Location: [facility];  Service: General;  Laterality: Right;  . PORTACATH PLACEMENT N/A 05/14/2012   Procedure: INSERTION PORT-A-CATH;  Surgeon: Adin Hector, MD;  Location: [facility];  Service: General;  Laterality: N/A;  . TONSILLECTOMY    . TOTAL MASTECTOMY Bilateral 05/14/2012   Procedure: TOTAL MASTECTOMY;  Surgeon: Adin Hector, MD;  Location: [facility];  Service: General;  Laterality: Bilateral;  . tummy tuck  1999  . WOUND DEBRIDEMENT  01/14/2011   Procedure: DEBRIDEMENT ABDOMINAL WOUND;  Surgeon: Adin Hector, MD;  Location: [facility];  Service: General;  Laterality: N/A;  wound debridement and debridement on the abdomen    FAMILY HISTORY Family History  Problem Relation Age of Onset  . Heart attack Father   . Diabetes Father   .  Heart attack Brother   . Diabetes Brother   . Diabetes Sister   . Breast cancer Sister 40  . Diabetes Mother   . Breast cancer Mother 54  . Parkinson's disease Sister   . Heart attack Brother    the patient's father died 2 days after prostate cancer surgery, possibly from a blood clot. The patient's mother lived to be 37 years old. She had been diagnosed with breast cancer around the age of 23. The patient has 3 brothers, 4 sisters. One sister was diagnosed with breast cancer at the age of 77. There is no history of ovarian cancer in the family. The patient underwent genetic testing October 2014, with a VUS as the only finding  GYNECOLOGIC HISTORY:  Menarche age 64, first live birth age 13, the patient is [place] P2. She went through the change of life approximately age 60. 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 He is Broadus John "Joe" Murrillo, who is a former judge and currently a Architectural technologist. The patient's daughter Garnette Scheuermann is an attorney in [place]. The patient's son Broadus John completed a Masters in Librarian, academic in [place]. The patient has 2 granddaughters.    ADVANCED DIRECTIVES: in place   HEALTH MAINTENANCE: Social History  Substance Use Topics  . Smoking status: Former Research scientist (life sciences)  . Smokeless tobacco: Never Used  . Alcohol use 1.2 - 1.8 oz/week    2 - 3 Glasses of wine per week     Colonoscopy:  PAP:  Bone density: January 2015  Lipid panel:  Allergies  Allergen Reactions  . Darvon Hives and Itching    All over body  . Penicillins Hives and Itching    All over body    Current Outpatient Prescriptions  Medication Sig Dispense Refill  . Ascorbic Acid (VITAMIN C) 500 MG tablet Take 500 mg by mouth daily.      . Biotin 10 MG TABS Take by mouth. 4000 mcg daily    . Calcium Citrate (CITRACAL PO) Take by mouth.    . Cholecalciferol (VITAMIN D PO) Take 1 tablet by mouth daily.    Marland Kitchen  KRILL OIL PO Take by mouth.    . letrozole (FEMARA) 2.5 MG tablet TAKE 1 TABLET (2.5 MG TOTAL) BY MOUTH DAILY. 90 tablet 2  . Multiple Vitamin (MULTIVITAMIN PO) Take 1 tablet by mouth daily.     . rosuvastatin (CRESTOR) 5 MG tablet Take 1 tablet (5 mg total) by mouth daily at 6 PM. 30 tablet 6  . sitaGLIPtin (JANUVIA) 100 MG tablet Take 100 mg by mouth daily.    Marland Kitchen UNABLE TO FIND Fiber Gummies. Pt takes 3 gummies daily by mouth.     No current facility-administered medications for this visit.     OBJECTIVE: Middle-aged [place] American woman In no acute distress Vitals:   01/01/16 1201  BP: (!) 150/77  Pulse: 79  Temp: 98.7 F (37.1 C)     Body mass index is 33.11 kg/m.    ECOG FS:1 - Symptomatic but completely ambulatory  Sclerae unicteric, pupils round and equal Oropharynx clear and moist-- no thrush or other lesions No cervical or supraclavicular adenopathy Lungs no rales or rhonchi Heart regular rate and rhythm Abd soft, nontender, positive bowel sounds MSK no focal spinal tenderness, no upper extremity lymphedema Neuro: nonfocal, well oriented, appropriate affect Breasts: Status post bilateral mastectomies with bilateral implants in place. There is no evidence of local recurrence. Both axillae are benign.   LAB RESULTS:  CMP     Component Value Date/Time   NA 143 01/01/2016 1117   K 3.7 01/01/2016 1117   CL 104 07/14/2013 1516   CL 104 07/30/2012 0955   CO2 28 01/01/2016 1117   GLUCOSE 99 01/01/2016 1117   GLUCOSE 138 (H) 07/30/2012 0955   BUN 8.9 01/01/2016 1117   CREATININE 0.7 01/01/2016 1117   CALCIUM 10.3 01/01/2016 1117   PROT 7.3 01/01/2016 1117   ALBUMIN 3.7 01/01/2016 1117   AST 23 01/01/2016 1117   ALT 21 01/01/2016 1117   ALKPHOS 120 01/01/2016 1117   BILITOT 0.49 01/01/2016 1117   GFRNONAA >90 05/17/2012 0530   GFRAA >90 05/17/2012 0530    I No results found for: SPEP  Lab Results  Component Value Date   WBC 4.8 01/01/2016   NEUTROABS 2.6  01/01/2016   HGB 13.1 01/01/2016   HCT 40.4 01/01/2016   MCV 86.7 01/01/2016   PLT 301 01/01/2016      Chemistry      Component Value Date/Time   NA 143 01/01/2016 1117   K 3.7 01/01/2016 1117   CL 104 07/14/2013 1516   CL 104 07/30/2012 0955   CO2 28 01/01/2016 1117   BUN 8.9 01/01/2016 1117   CREATININE  0.7 01/01/2016 1117      Component Value Date/Time   CALCIUM 10.3 01/01/2016 1117   ALKPHOS 120 01/01/2016 1117   AST 23 01/01/2016 1117   ALT 21 01/01/2016 1117   BILITOT 0.49 01/01/2016 1117       Lab Results  Component Value Date   LABCA2 9 05/10/2012    No components found for: GYJEH631  No results for input(s): INR in the last 168 hours.  Urinalysis    Component Value Date/Time   COLORURINE YELLOW 05/10/2012 1444   APPEARANCEUR CLEAR 05/10/2012 1444   LABSPEC 1.009 05/10/2012 1444   PHURINE 7.0 05/10/2012 1444   GLUCOSEU NEGATIVE 05/10/2012 1444   HGBUR NEGATIVE 05/10/2012 1444   BILIRUBINUR NEGATIVE 05/10/2012 1444   KETONESUR NEGATIVE 05/10/2012 1444   PROTEINUR NEGATIVE 05/10/2012 1444   UROBILINOGEN 0.2 05/10/2012 1444   NITRITE NEGATIVE 05/10/2012 1444   LEUKOCYTESUR MODERATE (A) 05/10/2012 1444    STUDIES:  Dg Hip Unilat W Or W/o Pelvis 1 View Right  Result Date: 01/01/2016 CLINICAL DATA:  One year of right hip pain with no known injury. History of breast malignancy. EXAM: DG HIP (WITH OR WITHOUT PELVIS) 1V RIGHT COMPARISON:  Scout film from a PET-CT study of May 12, 2012 FINDINGS: The bones are subjectively osteopenic. No lytic or blastic pelvic lesion is observed. There is symmetric narrowing of the right hip joint space. The articular surfaces of the femoral head and acetabulum remains smoothly rounded. Small osteophytes arise from the superior articular margin of the femoral head. The femoral neck, intertrochanteric, and subtrochanteric regions are normal. IMPRESSION: There is degenerative change of the right hip with mild joint space  narrowing and articular margin osteophyte formation. There is no acute bony abnormality. Electronically Signed   By: David  Martinique M.D.   On: 01/01/2016 14:17    ASSESSMENT: 65 y.o. BRCA negative [place] woman status post bilateral mastectomies 05/14/2012  (a) on the left, mpT1c pN0, stage IA invasive ductal carcinoma, grade 2, estrogen receptor 100% positive, progesterone receptor 11% positive, with an MIB-1 of 80%, and HER-2 amplified, the ratio being 6.50.  (b) on the right ,pT1c pN0, stage IA invasive ductal carcinoma, grade 3, triple negative, with an MIB-1 of 92%   (1) treated adjuvantly with doxorubicin and cyclophosphamide in dose dense fashion x4, completed 08/13/2012, followed by weekly Abraxane x12 given with concurrent trastuzumab, completed 12/03/2012  (2) received trastuzumab between 08/27/2012 and 06/29/2013, discontinued because of concerns regarding weakening of the heart muscle  (a) echo 07/25/2013 showed an ejection fraction of 55-60%  (3) letrozole started November 2014; with fatigue and multiple arthralgias/myalgias, letrozole was held for 6 weeks with no improvement in symptoms, and so it was resumed.   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 There were no deleterious mutations in the genetic panel tested, which included ATM, BARD1, BRCA1, BRCA2, BRIP1, CDH1, CHEK2, EPCAM, FANCC, MLH1, MSH2, MSH6, NBN, PALB2, PMS2, PTEN, RAD51C, RAD51D, STK11, TP53, and XRCC2   PLAN: [name] is soon to be 4 years out from definitive surgery for her breast cancer with no evidence of disease recurrence. This is very favorable.  She is tolerating letrozole well, aside from hot flashes, which she is able to manage.  I'm not sure if the right hip discomfort she is experiencing is due to arthritis or to the  problems  with her spine that she tells me she has had for a long time. We are going to obtain a plain film today and take it from there.  We discussed letrozole cost issues and she knows that cost going is probably the cheapest place where she can get any of her medications refilled.  I encouraged her to get on her treadmill several times a week. She did have a bone density which I do not yet have the results of. We will see whether we can locate that area  Otherwise she will return to see me in one year. She knows to call for any problems that may develop before that visit.  Chauncey Cruel, MD   01/01/2016 2:33 PM      Chauncey Cruel, MD Medical Oncology and Hematology [facility] [address] [place], [place] [address] Tel. [phone]    Fax. [phone]

## 2016-01-02 LAB — VITAMIN D 25 HYDROXY (VIT D DEFICIENCY, FRACTURES): Vitamin D, 25-Hydroxy: 36.8 ng/mL (ref 30.0–100.0)

## 2016-01-15 ENCOUNTER — Telehealth: Payer: Self-pay

## 2016-01-15 NOTE — Telephone Encounter (Signed)
Pt requested results of hip x-ray - given. She is asking what Dr Jana Hakim plans as the next course of action.  Pt is asking if Dr Jana Hakim ever received the bone density scan report from [place]. This was done in June 2017. No report found in Media file.

## 2016-01-17 DIAGNOSIS — E559 Vitamin D deficiency, unspecified: Secondary | ICD-10-CM | POA: Diagnosis not present

## 2016-01-17 DIAGNOSIS — Z0000 Encounter for general adult medical examination without abnormal findings: Secondary | ICD-10-CM | POA: Diagnosis not present

## 2016-01-17 DIAGNOSIS — E1165 Type 2 diabetes mellitus with hyperglycemia: Secondary | ICD-10-CM | POA: Diagnosis not present

## 2016-01-28 ENCOUNTER — Other Ambulatory Visit: Payer: Self-pay | Admitting: Oncology

## 2016-01-28 ENCOUNTER — Encounter: Payer: Self-pay | Admitting: Oncology

## 2016-01-28 NOTE — Progress Notes (Unsigned)
Right hip film obtained 01/01/2016 showed degenerative changes. There is no bony abnormality. We also reviewed her bone density scan obtained in 08/02/2015. This shows osteopenia with a T score of -1.9.

## 2016-01-28 NOTE — Progress Notes (Signed)
[facility]  [phone] [phone]     ID: [name] G Switalski OB: 1950-09-01  MR#: [id number]  XAJ#:[id number]  PCP: [name], MD GYN:  Crawford Givens SU: Fanny Skates OTHER MD: Crissie Reese, Daneen Schick  CHIEF COMPLAINT: bilateral breast cancers CURRENT TREATMENT: letrozole  BREAST CANCER HISTORY: From Dr. Dana Allan original intake note, 04/27/2012:  "[name] [name] is a 65 y.o. female. With medical history significant for diabetes, heart murmurs, hiatal hernia. Patient's labs normal mammogram was 2 years ago. This year she went on to have a screening mammogram performed that showed 2 areas of concern on the left side.calcifications were noted in the left. In the right breast a possible mass warrants in further evaluation.  On 03/29/2012 patient underwent a bilateral diagnostic mammogram and right breast ultrasound. A spot magnification images demonstrate suspicious group of pleomorphic microcalcifications over the outer lower left breast with additional suspicious group of pleomorphic microcalcifications over the outer midportion of the left periareolar region. Spot compression images of the right breast demonstrate persistence of a 1 cm density at the edge of the film in the deep third of the right inner breast. Ultrasound performed showed no focal abnormality over the entire in her right breast to correspond to the mammographic density. Patient was recommended stereotactic core needle biopsy of the 2 groups of suspicious left breast microcalcifications. Because patient and her husband wanted bilateral mastectomies MRI was not performed.on 04/15/2012 patient had needle core biopsy performed of the 2 areas in the left breast. The left needle core biopsy in the lower breast revealed ductal carcinoma in situ with associated comedo necrosis and calcifications with the in situ carcinoma. It was ER +100% PR +12%. 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 Tumor was ER +100% PR +11% proliferation marker Ki-67 elevated at 80% HER-2/neu showed amplification with a ratio of 6.50. Patient was seen by Dr. Fanny Skates. Patient and her family desire bilateral mastectomies with immediate reconstruction. She is now seen in medical oncology for discussion of adjuvant therapy since patient does have a HER-2 positive ER positive breast cancer. Overall she's doing well she is accompanied by her husband and she is without any complaints. Her case was discussed at the multidisciplinary breast conference. Pathology and radiology were reviewed."  Her subsequent history is detailed below  INTERVAL HISTORY: [name] returns today for follow-up of her breast cancer, accompanied by her husband, [name]. She continues on letrozole, with good tolerance. Hot flashes and vaginal dryness are not a major issue. She never developed the arthralgias or myalgias that many patients can experience on this medication. She obtains it at a good price.  REVIEW OF SYSTEMS: [name] is having problems "with my right side". Her right elbow hurts, her right hip hurts and sometimes her right thigh hurts, and her right ankle hurts. None of this is constant. It is not clear whether walking makes it better or worse. Flexeril sometimes helps. She tells me that she is known to have a bulging disc and wonders if this could possibly be related. She is not exercising regularly although she has access to a treadmill. A second problem is problems with heartburn. She takes 20 mg of Prilosec daily but that is not taking care of the problem. She feels forgetful but not anxious or depressed. She has hot flashes. Aside from these issues she is doing "pretty good". A detailed review of systems today was otherwise stable  PAST MEDICAL HISTORY: Past Medical History:  Diagnosis Date  . Breast cancer ([facility]) 04/15/12   left-biopsy  . Breast cancer,  right breast ([facility]) 05/14/12   right mastectomy  . Bronchitis   . Cancer ([facility])    breast  . 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 CHF with unknown LVEF ([facility]) 03/09/2013  . Diabetes mellitus   . Diabetes mellitus without complication ([facility])   . Heart murmur   . Hiatal hernia   . Osteopenia   . Pneumonia   . Sinus problem     PAST SURGICAL HISTORY: Past Surgical History:  Procedure Laterality Date  . AXILLARY SENTINEL NODE BIOPSY Left 05/14/2012   Procedure: AXILLARY SENTINEL NODE BIOPSY;  Surgeon: Adin Hector, MD;  Location: [facility];  Service: General;  Laterality: Left;  . BREAST RECONSTRUCTION WITH PLACEMENT OF TISSUE EXPANDER AND FLEX HD (ACELLULAR HYDRATED DERMIS) Bilateral 05/14/2012   Procedure: BREAST RECONSTRUCTION WITH PLACEMENT OF TISSUE EXPANDER AND FLEX HD (ACELLULAR HYDRATED DERMIS);  Surgeon: Crissie Reese, MD;  Location: [facility];  Service: Plastics;  Laterality: Bilateral;  . BUNIONECTOMY    . [place]  . COSMETIC SURGERY    . FOOT SURGERY    . GASTRIC BYPASS    . PORT-A-CATH REMOVAL Right 09/12/2013   Procedure: REMOVAL PORT-A-CATH;  Surgeon: Adin Hector, MD;  Location: [facility];  Service: General;  Laterality: Right;  . PORTACATH PLACEMENT N/A 05/14/2012   Procedure: INSERTION PORT-A-CATH;  Surgeon: Adin Hector, MD;  Location: [facility];  Service: General;  Laterality: N/A;  . TONSILLECTOMY    . TOTAL MASTECTOMY Bilateral 05/14/2012   Procedure: TOTAL MASTECTOMY;  Surgeon: Adin Hector, MD;  Location: [facility];  Service: General;  Laterality: Bilateral;  . tummy tuck  1999  . WOUND DEBRIDEMENT  01/14/2011   Procedure: DEBRIDEMENT ABDOMINAL WOUND;  Surgeon: Adin Hector, MD;  Location: [facility];  Service: General;  Laterality: N/A;  wound debridement and debridement on the abdomen    FAMILY HISTORY Family History  Problem Relation Age of Onset  . Heart attack Father   . Diabetes Father   .  Heart attack Brother   . Diabetes Brother   . Diabetes Sister   . Breast cancer Sister 40  . Diabetes Mother   . Breast cancer Mother 54  . Parkinson's disease Sister   . Heart attack Brother    the patient's father died 2 days after prostate cancer surgery, possibly from a blood clot. The patient's mother lived to be 37 years old. She had been diagnosed with breast cancer around the age of 23. The patient has 3 brothers, 4 sisters. One sister was diagnosed with breast cancer at the age of 77. There is no history of ovarian cancer in the family. The patient underwent genetic testing October 2014, with a VUS as the only finding  GYNECOLOGIC HISTORY:  Menarche age 64, first live birth age 13, the patient is [place] P2. She went through the change of life approximately age 60. 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 He is Broadus John "Joe" Murrillo, who is a former judge and currently a Architectural technologist. The patient's daughter Garnette Scheuermann is an attorney in [place]. The patient's son Broadus John completed a Masters in Librarian, academic in [place]. The patient has 2 granddaughters.    ADVANCED DIRECTIVES: in place   HEALTH MAINTENANCE: Social History  Substance Use Topics  . Smoking status: Former Research scientist (life sciences)  . Smokeless tobacco: Never Used  . Alcohol use 1.2 - 1.8 oz/week    2 - 3 Glasses of wine per week     Colonoscopy:  PAP:  Bone density: January 2015  Lipid panel:  Allergies  Allergen Reactions  . Darvon Hives and Itching    All over body  . Penicillins Hives and Itching    All over body    Current Outpatient Prescriptions  Medication Sig Dispense Refill  . Ascorbic Acid (VITAMIN C) 500 MG tablet Take 500 mg by mouth daily.      . Biotin 10 MG TABS Take by mouth. 4000 mcg daily    . Calcium Citrate (CITRACAL PO) Take by mouth.    . Cholecalciferol (VITAMIN D PO) Take 1 tablet by mouth daily.    Marland Kitchen  KRILL OIL PO Take by mouth.    . letrozole (FEMARA) 2.5 MG tablet TAKE 1 TABLET (2.5 MG TOTAL) BY MOUTH DAILY. 90 tablet 2  . Multiple Vitamin (MULTIVITAMIN PO) Take 1 tablet by mouth daily.     Marland Kitchen omeprazole (PRILOSEC) 20 MG capsule Take 20 mg by mouth daily.    . rosuvastatin (CRESTOR) 5 MG tablet Take 1 tablet (5 mg total) by mouth daily at 6 PM. 30 tablet 6  . sitaGLIPtin (JANUVIA) 100 MG tablet Take 100 mg by mouth daily.    Marland Kitchen UNABLE TO FIND Fiber Gummies. Pt takes 3 gummies daily by mouth.     No current facility-administered medications for this visit.     OBJECTIVE: Middle-aged [place] American woman In no acute distress There were no vitals filed for this visit.   There is no height or weight on file to calculate BMI.    ECOG FS:1 - Symptomatic but completely ambulatory  Sclerae unicteric, pupils round and equal Oropharynx clear and moist-- no thrush or other lesions No cervical or supraclavicular adenopathy Lungs no rales or rhonchi Heart regular rate and rhythm Abd soft, nontender, positive bowel sounds MSK no focal spinal tenderness, no upper extremity lymphedema Neuro: nonfocal, well oriented, appropriate affect Breasts: Status post bilateral mastectomies with bilateral implants in place. There is no evidence of local recurrence. Both axillae are benign.   LAB RESULTS:  CMP     Component Value Date/Time   NA 143 01/01/2016 1117   K 3.7 01/01/2016 1117   CL 104 07/14/2013 1516   CL 104 07/30/2012 0955   CO2 28 01/01/2016 1117   GLUCOSE 99 01/01/2016 1117   GLUCOSE 138 (H) 07/30/2012 0955   BUN 8.9 01/01/2016 1117   CREATININE 0.7 01/01/2016 1117   CALCIUM 10.3 01/01/2016 1117   PROT 7.3 01/01/2016 1117   ALBUMIN 3.7 01/01/2016 1117   AST 23 01/01/2016 1117   ALT 21 01/01/2016 1117   ALKPHOS 120 01/01/2016 1117   BILITOT 0.49 01/01/2016 1117   GFRNONAA >90 05/17/2012 0530   GFRAA >90 05/17/2012 0530    I No results found for: SPEP  Lab Results    Component Value Date   WBC 4.8 01/01/2016   NEUTROABS 2.6 01/01/2016   HGB 13.1 01/01/2016   HCT 40.4 01/01/2016   MCV 86.7 01/01/2016   PLT 301 01/01/2016      Chemistry      Component Value Date/Time   NA 143 01/01/2016 1117   K 3.7 01/01/2016 1117   CL 104 07/14/2013 1516   CL 104 07/30/2012 0955   CO2 28 01/01/2016 1117  BUN 8.9 01/01/2016 1117   CREATININE 0.7 01/01/2016 1117      Component Value Date/Time   CALCIUM 10.3 01/01/2016 1117   ALKPHOS 120 01/01/2016 1117   AST 23 01/01/2016 1117   ALT 21 01/01/2016 1117   BILITOT 0.49 01/01/2016 1117       Lab Results  Component Value Date   LABCA2 9 05/10/2012    No components found for: [id number]  No results for input(s): INR in the last 168 hours.  Urinalysis    Component Value Date/Time   COLORURINE YELLOW 05/10/2012 1444   APPEARANCEUR CLEAR 05/10/2012 1444   LABSPEC 1.009 05/10/2012 1444   PHURINE 7.0 05/10/2012 1444   GLUCOSEU NEGATIVE 05/10/2012 1444   HGBUR NEGATIVE 05/10/2012 1444   BILIRUBINUR NEGATIVE 05/10/2012 1444   KETONESUR NEGATIVE 05/10/2012 1444   PROTEINUR NEGATIVE 05/10/2012 1444   UROBILINOGEN 0.2 05/10/2012 1444   NITRITE NEGATIVE 05/10/2012 1444   LEUKOCYTESUR MODERATE (A) 05/10/2012 1444    STUDIES:  Dg Hip Unilat W Or W/o Pelvis 1 View Right  Result Date: 01/01/2016 CLINICAL DATA:  One year of right hip pain with no known injury. History of breast malignancy. EXAM: DG HIP (WITH OR WITHOUT PELVIS) 1V RIGHT COMPARISON:  Scout film from a PET-CT study of May 12, 2012 FINDINGS: The bones are subjectively osteopenic. No lytic or blastic pelvic lesion is observed. There is symmetric narrowing of the right hip joint space. The articular surfaces of the femoral head and acetabulum remains smoothly rounded. Small osteophytes arise from the superior articular margin of the femoral head. The femoral neck, intertrochanteric, and subtrochanteric regions are normal. IMPRESSION: There is  degenerative change of the right hip with mild joint space narrowing and articular margin osteophyte formation. There is no acute bony abnormality. Electronically Signed   By: David  Martinique M.D.   On: 01/01/2016 14:17    ASSESSMENT: 65 y.o. BRCA negative [place] woman status post bilateral mastectomies 05/14/2012  (a) on the left, mpT1c pN0, stage IA invasive ductal carcinoma, grade 2, estrogen receptor 100% positive, progesterone receptor 11% positive, with an MIB-1 of 80%, and HER-2 amplified, the ratio being 6.50.  (b) on the right ,pT1c pN0, stage IA invasive ductal carcinoma, grade 3, triple negative, with an MIB-1 of 92%   (1) treated adjuvantly with doxorubicin and cyclophosphamide in dose dense fashion x4, completed 08/13/2012, followed by weekly Abraxane x12 given with concurrent trastuzumab, completed 12/03/2012  (2) received trastuzumab between 08/27/2012 and 06/29/2013, discontinued because of concerns regarding weakening of the heart muscle  (a) echo 07/25/2013 showed an ejection fraction of 55-60%  (3) letrozole started November 2014; with fatigue and multiple arthralgias/myalgias, letrozole was held for 6 weeks with no improvement in symptoms, and so it was resumed.   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 There were no deleterious mutations in the genetic panel tested, which included ATM, BARD1, BRCA1, BRCA2, BRIP1, CDH1, CHEK2, EPCAM, FANCC, MLH1, MSH2, MSH6, NBN, PALB2, PMS2, PTEN, RAD51C, RAD51D, STK11, TP53, and XRCC2   PLAN: [name] is soon to be 4 years out from definitive surgery for her breast cancer with no evidence of disease recurrence. This is very favorable.  She is tolerating letrozole well, aside from hot flashes, which she is able to manage.  I'm not sure if the right hip  discomfort she is experiencing is  due to arthritis or to the problems with her spine that she tells me she has had for a long time. We are going to obtain a plain film today and take it from there.  We discussed letrozole cost issues and she knows that cost going is probably the cheapest place where she can get any of her medications refilled.  I encouraged her to get on her treadmill several times a week. She did have a bone density which I do not yet have the results of. We will see whether we can locate that area  Otherwise she will return to see me in one year. She knows to call for any problems that may develop before that visit.  Chauncey Cruel, MD   01/28/2016 11:13 AM      Chauncey Cruel, MD Medical Oncology and Hematology [facility] [address] [place], [place] [address] Tel. [phone]    Fax. [phone]

## 2016-03-19 DIAGNOSIS — Z01419 Encounter for gynecological examination (general) (routine) without abnormal findings: Secondary | ICD-10-CM | POA: Diagnosis not present

## 2016-03-19 DIAGNOSIS — R1031 Right lower quadrant pain: Secondary | ICD-10-CM | POA: Diagnosis not present

## 2016-03-25 DIAGNOSIS — M545 Low back pain: Secondary | ICD-10-CM | POA: Diagnosis not present

## 2016-03-25 DIAGNOSIS — M25521 Pain in right elbow: Secondary | ICD-10-CM | POA: Diagnosis not present

## 2016-03-25 DIAGNOSIS — M25551 Pain in right hip: Secondary | ICD-10-CM | POA: Diagnosis not present

## 2016-03-25 DIAGNOSIS — R1031 Right lower quadrant pain: Secondary | ICD-10-CM | POA: Diagnosis not present

## 2016-03-25 DIAGNOSIS — M1611 Unilateral primary osteoarthritis, right hip: Secondary | ICD-10-CM | POA: Diagnosis not present

## 2016-04-01 DIAGNOSIS — M545 Low back pain: Secondary | ICD-10-CM | POA: Diagnosis not present

## 2016-04-08 DIAGNOSIS — M545 Low back pain: Secondary | ICD-10-CM | POA: Diagnosis not present

## 2016-04-15 DIAGNOSIS — M545 Low back pain: Secondary | ICD-10-CM | POA: Diagnosis not present

## 2016-04-22 DIAGNOSIS — M545 Low back pain: Secondary | ICD-10-CM | POA: Diagnosis not present

## 2016-04-24 DIAGNOSIS — M1611 Unilateral primary osteoarthritis, right hip: Secondary | ICD-10-CM | POA: Diagnosis not present

## 2016-05-01 DIAGNOSIS — M1611 Unilateral primary osteoarthritis, right hip: Secondary | ICD-10-CM | POA: Diagnosis not present

## 2016-05-07 ENCOUNTER — Other Ambulatory Visit: Payer: Self-pay | Admitting: Orthopedic Surgery

## 2016-05-12 DIAGNOSIS — Z79899 Other long term (current) drug therapy: Secondary | ICD-10-CM | POA: Diagnosis not present

## 2016-05-12 DIAGNOSIS — E1165 Type 2 diabetes mellitus with hyperglycemia: Secondary | ICD-10-CM | POA: Diagnosis not present

## 2016-05-12 DIAGNOSIS — M1611 Unilateral primary osteoarthritis, right hip: Secondary | ICD-10-CM | POA: Diagnosis not present

## 2016-05-12 DIAGNOSIS — Z6834 Body mass index (BMI) 34.0-34.9, adult: Secondary | ICD-10-CM | POA: Diagnosis not present

## 2016-05-15 ENCOUNTER — Ambulatory Visit (HOSPITAL_COMMUNITY)
Admission: RE | Admit: 2016-05-15 | Discharge: 2016-05-15 | Disposition: A | Discharge status: Home / Self Care | Payer: Medicare Other | Source: Ambulatory Visit | Attending: Orthopedic Surgery | Admitting: Orthopedic Surgery

## 2016-05-15 ENCOUNTER — Encounter (HOSPITAL_COMMUNITY)
Admission: RE | Admit: 2016-05-15 | Discharge: 2016-05-15 | Disposition: A | Discharge status: Home / Self Care | Payer: Medicare Other | Source: Ambulatory Visit | Attending: Orthopedic Surgery | Admitting: Orthopedic Surgery

## 2016-05-15 ENCOUNTER — Encounter (HOSPITAL_COMMUNITY): Payer: Self-pay

## 2016-05-15 DIAGNOSIS — Z01810 Encounter for preprocedural cardiovascular examination: Secondary | ICD-10-CM | POA: Insufficient documentation

## 2016-05-15 DIAGNOSIS — E785 Hyperlipidemia, unspecified: Secondary | ICD-10-CM | POA: Insufficient documentation

## 2016-05-15 DIAGNOSIS — K219 Gastro-esophageal reflux disease without esophagitis: Secondary | ICD-10-CM | POA: Diagnosis not present

## 2016-05-15 DIAGNOSIS — Z79899 Other long term (current) drug therapy: Secondary | ICD-10-CM | POA: Insufficient documentation

## 2016-05-15 DIAGNOSIS — E119 Type 2 diabetes mellitus without complications: Secondary | ICD-10-CM | POA: Insufficient documentation

## 2016-05-15 DIAGNOSIS — M1611 Unilateral primary osteoarthritis, right hip: Secondary | ICD-10-CM | POA: Insufficient documentation

## 2016-05-15 DIAGNOSIS — Z01818 Encounter for other preprocedural examination: Secondary | ICD-10-CM

## 2016-05-15 DIAGNOSIS — I700 Atherosclerosis of aorta: Secondary | ICD-10-CM | POA: Insufficient documentation

## 2016-05-15 HISTORY — DX: Gastro-esophageal reflux disease without esophagitis: K21.9

## 2016-05-15 HISTORY — DX: Sleep apnea, unspecified: G47.30

## 2016-05-15 HISTORY — DX: Unspecified osteoarthritis, unspecified site: M19.90

## 2016-05-15 LAB — CBC WITH DIFFERENTIAL/PLATELET
Basophils Absolute: 0 10*3/uL (ref 0.0–0.1)
Basophils Relative: 0 %
Eosinophils Absolute: 0 10*3/uL (ref 0.0–0.7)
Eosinophils Relative: 1 %
HCT: 40.9 % (ref 36.0–46.0)
Hemoglobin: 13.2 g/dL (ref 12.0–15.0)
Lymphocytes Relative: 28 %
Lymphs Abs: 1.7 10*3/uL (ref 0.7–4.0)
MCH: 27.7 pg (ref 26.0–34.0)
MCHC: 32.3 g/dL (ref 30.0–36.0)
MCV: 85.7 fL (ref 78.0–100.0)
Monocytes Absolute: 0.5 10*3/uL (ref 0.1–1.0)
Monocytes Relative: 8 %
Neutro Abs: 3.8 10*3/uL (ref 1.7–7.7)
Neutrophils Relative %: 63 %
Platelets: 313 10*3/uL (ref 150–400)
RBC: 4.77 MIL/uL (ref 3.87–5.11)
RDW: 13.8 % (ref 11.5–15.5)
WBC: 6.1 10*3/uL (ref 4.0–10.5)

## 2016-05-15 LAB — SURGICAL PCR SCREEN
MRSA, PCR: NEGATIVE
Staphylococcus aureus: NEGATIVE

## 2016-05-15 LAB — URINALYSIS, ROUTINE W REFLEX MICROSCOPIC
Bacteria, UA: NONE SEEN
Bilirubin Urine: NEGATIVE
Glucose, UA: NEGATIVE mg/dL
Hgb urine dipstick: NEGATIVE
Ketones, ur: NEGATIVE mg/dL
Nitrite: NEGATIVE
Protein, ur: NEGATIVE mg/dL
Specific Gravity, Urine: 1.009 (ref 1.005–1.030)
pH: 7 (ref 5.0–8.0)

## 2016-05-15 LAB — BASIC METABOLIC PANEL
Anion gap: 13 (ref 5–15)
BUN: <5 mg/dL — ABNORMAL LOW (ref 6–20)
CO2: 29 mmol/L (ref 22–32)
Calcium: 10.4 mg/dL — ABNORMAL HIGH (ref 8.9–10.3)
Chloride: 100 mmol/L — ABNORMAL LOW (ref 101–111)
Creatinine, Ser: 0.68 mg/dL (ref 0.44–1.00)
GFR calc Af Amer: >60 mL/min (ref >60)
GFR calc non Af Amer: >60 mL/min (ref >60)
Glucose, Bld: 130 mg/dL — ABNORMAL HIGH (ref 65–99)
Potassium: 3.5 mmol/L (ref 3.5–5.1)
Sodium: 142 mmol/L (ref 135–145)

## 2016-05-15 LAB — TYPE AND SCREEN
ABO/RH(D): A POS
Antibody Screen: NEGATIVE

## 2016-05-15 LAB — PROTIME-INR
INR: 1.09
Prothrombin Time: 14.1 seconds (ref 11.4–15.2)

## 2016-05-15 LAB — APTT: aPTT: 35 seconds (ref 24–36)

## 2016-05-15 LAB — GLUCOSE, CAPILLARY: Glucose-Capillary: 119 mg/dL — ABNORMAL HIGH (ref 65–99)

## 2016-05-15 NOTE — Pre-Procedure Instructions (Signed)
[name]  05/15/2016      [id number] [address], [place] [address] Phone: [phone] Fax: [phone]    Your procedure is scheduled on .05/26/2016  [facility] Admitting at 10:30 A.M.  Call this number if you have problems the morning of surgery:  [phone]   Remember:  Do not eat food or drink liquids after midnight.   Take these medicines the morning of surgery with A SIP OF WATER : Letrozole, and use Tramadol or Tylenol if needed     Do not wear jewelry, make-up or nail polish.   Do not wear lotions, powders, or perfumes, or deoderant.   Do not shave 48 hours prior to surgery.    Do not bring valuables to the hospital.   [facility] is not responsible for any belongings or valuables.  Contacts, dentures or bridgework may not be worn into surgery.  Leave your suitcase in the car.  After surgery it may be brought to your room.  For patients admitted to the hospital, discharge time will be determined by your treatment team.  Patients discharged the day of surgery will not be allowed to drive home.   Name and phone number of your driver:   With spouse  Special instructions:  Special Instructions:  - Preparing for Surgery  Before surgery, you can play an important role.  Because skin is not sterile, your skin needs to be as free of germs as possible.  You can reduce the number of germs on you skin by washing with CHG (chlorahexidine gluconate) soap before surgery.  CHG is an antiseptic cleaner which kills germs and bonds with the skin to continue killing germs even after washing.  Please DO NOT use if you have an allergy to CHG or antibacterial soaps.  If your skin becomes reddened/irritated stop using the CHG and inform your nurse when you arrive at Short Stay.  Do not shave (including legs and underarms) for at least 48 hours prior to the first CHG shower.  You may  shave your face.  Please follow these instructions carefully:   1.  Shower with CHG Soap the night before surgery and the  morning of Surgery.  2.  If you choose to wash your hair, wash your hair first as usual with your  normal shampoo.  3.  After you shampoo, rinse your hair and body thoroughly to remove the  Shampoo.  4.  Use CHG as you would any other liquid soap.  You can apply chg directly to the skin and wash gently with scrungie or a clean washcloth.  5.  Apply the CHG Soap to your body ONLY FROM THE NECK DOWN.    Do not use on open wounds or open sores.  Avoid contact with your eyes, ears, mouth and genitals (private parts).  Wash genitals (private parts)   with your normal soap.  6.  Wash thoroughly, paying special attention to the area where your surgery will be performed.  7.  Thoroughly rinse your body with warm water from the neck down.  8.  DO NOT shower/wash with your normal soap after using and rinsing off   the CHG Soap.  9.  Pat yourself dry with a clean towel.            10.  Wear clean pajamas.            11.  Place  clean sheets on your bed the night of your first shower and do not sleep with pets.  Day of Surgery  Do not apply any lotions/deodorants the morning of surgery.  Please wear clean clothes to the hospital/surgery center.   How to Manage Your Diabetes Before and After Surgery  Why is it important to control my blood sugar before and after surgery? . Improving blood sugar levels before and after surgery helps healing and can limit problems. . A way of improving blood sugar control is eating a healthy diet by: o  Eating less sugar and carbohydrates o  Increasing activity/exercise o  Talking with your doctor about reaching your blood sugar goals . High blood sugars (greater than 180 mg/dL) can raise your risk of infections and slow your recovery, so you will need to focus on controlling your diabetes during the weeks before surgery. . Make sure that the  doctor who takes care of your diabetes knows about your planned surgery including the date and location.  How do I manage my blood sugar before surgery? . Check your blood sugar at least 4 times a day, starting 2 days before surgery, to make sure that the level is not too high or low. o Check your blood sugar the morning of your surgery when you wake up and every 2 hours until you get to the Short Stay unit. . If your blood sugar is less than 70 mg/dL, you will need to treat for low blood sugar: o Do not take insulin. o Treat a low blood sugar (less than 70 mg/dL) with  cup of clear juice (cranberry or apple), 4 glucose tablets, OR glucose gel. o Recheck blood sugar in 15 minutes after treatment (to make sure it is greater than 70 mg/dL). If your blood sugar is not greater than 70 mg/dL on recheck, call [phone] for further instructions. . Report your blood sugar to the short stay nurse when you get to Short Stay.  . If you are admitted to the hospital after surgery: o Your blood sugar will be checked by the staff and you will probably be given insulin after surgery (instead of oral diabetes medicines) to make sure you have good blood sugar levels. o The goal for blood sugar control after surgery is 80-180 mg/dL.              WHAT DO I DO ABOUT MY DIABETES MEDICATION?   Marland Kitchen Do not take oral diabetes medicines (pills) the morning of surgery.  . .         Other Instructions:          Patient Signature:  Date:   Nurse Signature:  Date:   Reviewed and Endorsed by [facility] Patient Education Committee, August 2015  Please read over the following fact sheets that you were given. Pain Booklet, Coughing and Deep Breathing, MRSA Information and Surgical Site Infection Prevention

## 2016-05-15 NOTE — Progress Notes (Signed)
Pt. Is followed by Dr. [name], pt. Believes she recently had HgbA1c completed.  Call to MD office, no one available to give me results. Faxed request to MD office for last labs & ekg.

## 2016-05-19 ENCOUNTER — Other Ambulatory Visit: Payer: Self-pay | Admitting: Gastroenterology

## 2016-05-19 ENCOUNTER — Ambulatory Visit (HOSPITAL_COMMUNITY)
Admission: RE | Admit: 2016-05-19 | Discharge: 2016-05-19 | Disposition: A | Discharge status: Home / Self Care | Payer: Medicare Other | Source: Ambulatory Visit | Attending: Gastroenterology | Admitting: Gastroenterology

## 2016-05-19 DIAGNOSIS — R1012 Left upper quadrant pain: Secondary | ICD-10-CM

## 2016-05-19 DIAGNOSIS — K219 Gastro-esophageal reflux disease without esophagitis: Secondary | ICD-10-CM | POA: Diagnosis not present

## 2016-05-19 DIAGNOSIS — R1013 Epigastric pain: Secondary | ICD-10-CM

## 2016-05-19 DIAGNOSIS — K920 Hematemesis: Secondary | ICD-10-CM | POA: Diagnosis not present

## 2016-05-19 DIAGNOSIS — R1011 Right upper quadrant pain: Secondary | ICD-10-CM | POA: Diagnosis not present

## 2016-05-20 ENCOUNTER — Other Ambulatory Visit: Payer: Self-pay | Admitting: Orthopedic Surgery

## 2016-05-21 ENCOUNTER — Other Ambulatory Visit: Payer: Self-pay | Admitting: Gastroenterology

## 2016-05-21 DIAGNOSIS — K3189 Other diseases of stomach and duodenum: Secondary | ICD-10-CM | POA: Diagnosis not present

## 2016-05-21 DIAGNOSIS — K219 Gastro-esophageal reflux disease without esophagitis: Secondary | ICD-10-CM | POA: Diagnosis not present

## 2016-05-21 DIAGNOSIS — R1013 Epigastric pain: Secondary | ICD-10-CM | POA: Diagnosis not present

## 2016-05-21 DIAGNOSIS — K2970 Gastritis, unspecified, without bleeding: Secondary | ICD-10-CM | POA: Diagnosis not present

## 2016-05-21 DIAGNOSIS — K920 Hematemesis: Secondary | ICD-10-CM | POA: Diagnosis not present

## 2016-05-21 DIAGNOSIS — K269 Duodenal ulcer, unspecified as acute or chronic, without hemorrhage or perforation: Secondary | ICD-10-CM

## 2016-05-26 ENCOUNTER — Encounter (HOSPITAL_COMMUNITY): Admission: RE | Payer: Self-pay | Source: Ambulatory Visit

## 2016-05-26 ENCOUNTER — Inpatient Hospital Stay (HOSPITAL_COMMUNITY): Admission: RE | Admit: 2016-05-26 | Payer: Medicare Other | Source: Ambulatory Visit | Admitting: Orthopedic Surgery

## 2016-05-26 SURGERY — ARTHROPLASTY, HIP, TOTAL, ANTERIOR APPROACH
Anesthesia: Spinal | Laterality: Right

## 2016-05-30 ENCOUNTER — Ambulatory Visit
Admission: RE | Admit: 2016-05-30 | Discharge: 2016-05-30 | Disposition: A | Discharge status: Home / Self Care | Payer: Medicare Other | Source: Ambulatory Visit | Attending: Gastroenterology | Admitting: Gastroenterology

## 2016-05-30 DIAGNOSIS — K269 Duodenal ulcer, unspecified as acute or chronic, without hemorrhage or perforation: Secondary | ICD-10-CM

## 2016-05-30 DIAGNOSIS — R109 Unspecified abdominal pain: Secondary | ICD-10-CM | POA: Diagnosis not present

## 2016-06-05 DIAGNOSIS — K269 Duodenal ulcer, unspecified as acute or chronic, without hemorrhage or perforation: Secondary | ICD-10-CM | POA: Diagnosis not present

## 2016-06-05 DIAGNOSIS — K219 Gastro-esophageal reflux disease without esophagitis: Secondary | ICD-10-CM | POA: Diagnosis not present

## 2016-07-11 ENCOUNTER — Other Ambulatory Visit: Payer: Self-pay | Admitting: *Deleted

## 2016-07-11 MED ORDER — LETROZOLE 2.5 MG PO TABS: ORAL_TABLET | ORAL | 2 refills | Status: DC

## 2016-07-22 ENCOUNTER — Other Ambulatory Visit: Payer: Self-pay | Admitting: *Deleted

## 2016-07-22 DIAGNOSIS — K219 Gastro-esophageal reflux disease without esophagitis: Secondary | ICD-10-CM | POA: Diagnosis not present

## 2016-07-22 DIAGNOSIS — R140 Abdominal distension (gaseous): Secondary | ICD-10-CM | POA: Diagnosis not present

## 2016-07-22 DIAGNOSIS — R110 Nausea: Secondary | ICD-10-CM | POA: Diagnosis not present

## 2016-07-22 MED ORDER — LETROZOLE 2.5 MG PO TABS: ORAL_TABLET | ORAL | 2 refills | Status: DC

## 2016-07-23 ENCOUNTER — Telehealth: Payer: Self-pay

## 2016-07-23 NOTE — Telephone Encounter (Signed)
Pt called for letrozole refill. Called her back that it was escribed last night at 630 pm.

## 2016-08-27 DIAGNOSIS — E119 Type 2 diabetes mellitus without complications: Secondary | ICD-10-CM | POA: Diagnosis not present

## 2016-08-27 DIAGNOSIS — H01002 Unspecified blepharitis right lower eyelid: Secondary | ICD-10-CM | POA: Diagnosis not present

## 2016-08-27 DIAGNOSIS — H01005 Unspecified blepharitis left lower eyelid: Secondary | ICD-10-CM | POA: Diagnosis not present

## 2016-09-10 DIAGNOSIS — R030 Elevated blood-pressure reading, without diagnosis of hypertension: Secondary | ICD-10-CM | POA: Diagnosis not present

## 2016-09-10 DIAGNOSIS — E1165 Type 2 diabetes mellitus with hyperglycemia: Secondary | ICD-10-CM | POA: Diagnosis not present

## 2016-09-10 DIAGNOSIS — R0982 Postnasal drip: Secondary | ICD-10-CM | POA: Diagnosis not present

## 2016-09-10 DIAGNOSIS — M25551 Pain in right hip: Secondary | ICD-10-CM | POA: Diagnosis not present

## 2016-10-11 NOTE — Telephone Encounter (Signed)
error 

## 2016-11-17 DIAGNOSIS — E119 Type 2 diabetes mellitus without complications: Secondary | ICD-10-CM | POA: Diagnosis not present

## 2016-11-17 DIAGNOSIS — H2511 Age-related nuclear cataract, right eye: Secondary | ICD-10-CM | POA: Diagnosis not present

## 2016-11-17 DIAGNOSIS — H2512 Age-related nuclear cataract, left eye: Secondary | ICD-10-CM | POA: Diagnosis not present

## 2016-12-02 DIAGNOSIS — R110 Nausea: Secondary | ICD-10-CM | POA: Diagnosis not present

## 2016-12-02 DIAGNOSIS — E669 Obesity, unspecified: Secondary | ICD-10-CM | POA: Diagnosis not present

## 2016-12-02 DIAGNOSIS — K219 Gastro-esophageal reflux disease without esophagitis: Secondary | ICD-10-CM | POA: Diagnosis not present

## 2016-12-03 ENCOUNTER — Other Ambulatory Visit: Payer: Self-pay | Admitting: Gastroenterology

## 2016-12-03 DIAGNOSIS — R110 Nausea: Secondary | ICD-10-CM

## 2016-12-03 NOTE — Progress Notes (Signed)
[name] Nygaard MD 

## 2016-12-11 ENCOUNTER — Ambulatory Visit (HOSPITAL_COMMUNITY)
Admission: RE | Admit: 2016-12-11 | Discharge: 2016-12-11 | Disposition: A | Discharge status: Home / Self Care | Payer: Medicare Other | Source: Ambulatory Visit | Attending: Gastroenterology | Admitting: Gastroenterology

## 2016-12-11 DIAGNOSIS — R140 Abdominal distension (gaseous): Secondary | ICD-10-CM | POA: Diagnosis not present

## 2016-12-11 DIAGNOSIS — R110 Nausea: Secondary | ICD-10-CM | POA: Diagnosis not present

## 2016-12-11 DIAGNOSIS — Z23 Encounter for immunization: Secondary | ICD-10-CM | POA: Diagnosis not present

## 2016-12-11 MED ORDER — TECHNETIUM TC 99M SULFUR COLLOID
2.0000 | Freq: Once | INTRAVENOUS | Status: AC | PRN
  Administered 2016-12-11: 2 via INTRAVENOUS

## 2016-12-30 ENCOUNTER — Telehealth: Payer: Self-pay | Admitting: Oncology

## 2016-12-30 ENCOUNTER — Ambulatory Visit (HOSPITAL_BASED_OUTPATIENT_CLINIC_OR_DEPARTMENT_OTHER): Payer: Medicare Other | Admitting: Oncology

## 2016-12-30 ENCOUNTER — Other Ambulatory Visit (HOSPITAL_BASED_OUTPATIENT_CLINIC_OR_DEPARTMENT_OTHER): Payer: Medicare Other

## 2016-12-30 VITALS — BP 133/86 | HR 83 | Temp 97.6°F | Resp 20 | Ht 66.0 in | Wt 196.5 lb

## 2016-12-30 DIAGNOSIS — C50912 Malignant neoplasm of unspecified site of left female breast: Secondary | ICD-10-CM | POA: Diagnosis not present

## 2016-12-30 DIAGNOSIS — Z170 Estrogen receptor positive status [ER+]: Secondary | ICD-10-CM

## 2016-12-30 DIAGNOSIS — C50911 Malignant neoplasm of unspecified site of right female breast: Secondary | ICD-10-CM

## 2016-12-30 DIAGNOSIS — Z79811 Long term (current) use of aromatase inhibitors: Secondary | ICD-10-CM | POA: Diagnosis not present

## 2016-12-30 DIAGNOSIS — M8580 Other specified disorders of bone density and structure, unspecified site: Secondary | ICD-10-CM

## 2016-12-30 LAB — COMPREHENSIVE METABOLIC PANEL
ALT: 14 U/L (ref 0–55)
AST: 20 U/L (ref 5–34)
Albumin: 3.9 g/dL (ref 3.5–5.0)
Alkaline Phosphatase: 102 U/L (ref 40–150)
Anion Gap: 9 mEq/L (ref 3–11)
BUN: 12.6 mg/dL (ref 7.0–26.0)
CO2: 30 mEq/L — ABNORMAL HIGH (ref 22–29)
Calcium: 9.1 mg/dL (ref 8.4–10.4)
Chloride: 102 mEq/L (ref 98–109)
Creatinine: 0.8 mg/dL (ref 0.6–1.1)
EGFR: >60 mL/min/{1.73_m2} (ref >60)
Glucose: 136 mg/dl (ref 70–140)
Potassium: 4.1 mEq/L (ref 3.5–5.1)
Sodium: 141 mEq/L (ref 136–145)
Total Bilirubin: 0.4 mg/dL (ref 0.20–1.20)
Total Protein: 7.5 g/dL (ref 6.4–8.3)

## 2016-12-30 LAB — CBC WITH DIFFERENTIAL/PLATELET
BASO%: 0.7 % (ref 0.0–2.0)
Basophils Absolute: 0 10*3/uL (ref 0.0–0.1)
EOS%: 1 % (ref 0.0–7.0)
Eosinophils Absolute: 0.1 10*3/uL (ref 0.0–0.5)
HCT: 40.1 % (ref 34.8–46.6)
HGB: 13.2 g/dL (ref 11.6–15.9)
LYMPH%: 34.9 % (ref 14.0–49.7)
MCH: 28 pg (ref 25.1–34.0)
MCHC: 33 g/dL (ref 31.5–36.0)
MCV: 84.7 fL (ref 79.5–101.0)
MONO#: 0.5 10*3/uL (ref 0.1–0.9)
MONO%: 10.6 % (ref 0.0–14.0)
NEUT#: 2.7 10*3/uL (ref 1.5–6.5)
NEUT%: 52.8 % (ref 38.4–76.8)
Platelets: 278 10*3/uL (ref 145–400)
RBC: 4.74 10*6/uL (ref 3.70–5.45)
RDW: 14.5 % (ref 11.2–14.5)
WBC: 5.2 10*3/uL (ref 3.9–10.3)
lymph#: 1.8 10*3/uL (ref 0.9–3.3)

## 2016-12-30 NOTE — Progress Notes (Signed)
[place]  [phone] [phone]     ID: [name] [name] OB: [birth year]  MR#: [id number]  JFH#:[id number]  PCP: [name], MD GYN:  Crawford Givens SU: Fanny Skates OTHER MD: Crissie Reese, Daneen Schick  CHIEF COMPLAINT: bilateral breast cancers, estrogen receptor positive  CURRENT TREATMENT: letrozole  BREAST CANCER HISTORY: From Dr. Dana Allan original intake note, 04/27/2012:  "[name] [name] is a 66 y.o. female. With medical history significant for diabetes, heart murmurs, hiatal hernia. Patient's labs normal mammogram was 2 years ago. This year she went on to have a screening mammogram performed that showed 2 areas of concern on the left side.calcifications were noted in the left. In the right breast a possible mass warrants in further evaluation.  On 03/29/2012 patient underwent a bilateral diagnostic mammogram and right breast ultrasound. A spot magnification images demonstrate suspicious group of pleomorphic microcalcifications over the outer lower left breast with additional suspicious group of pleomorphic microcalcifications over the outer midportion of the left periareolar region. Spot compression images of the right breast demonstrate persistence of a 1 cm density at the edge of the film in the deep third of the right inner breast. Ultrasound performed showed no focal abnormality over the entire in her right breast to correspond to the mammographic density. Patient was recommended stereotactic core needle biopsy of the 2 groups of suspicious left breast microcalcifications. Because patient and her husband wanted bilateral mastectomies MRI was not performed.on 04/15/2012 patient had needle core biopsy performed of the 2 areas in the left breast. The left needle core biopsy in the lower breast revealed ductal carcinoma in situ with associated comedo necrosis and calcifications with the in situ carcinoma. It was ER +100% PR +12%. 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 Tumor was ER +100% PR +11% proliferation marker Ki-67 elevated at 80% HER-2/neu showed amplification with a ratio of 6.50. Patient was seen by Dr. Fanny Skates. Patient and her family desire bilateral mastectomies with immediate reconstruction. She is now seen in medical oncology for discussion of adjuvant therapy since patient does have a HER-2 positive ER positive breast cancer. Overall she's doing well she is accompanied by her husband and she is without any complaints. Her case was discussed at the multidisciplinary breast conference. Pathology and radiology were reviewed."  Her subsequent history is detailed below  INTERVAL HISTORY: [name] returns today for follow-up and treatment of her estrogen receptor positive breast cancer, accompanied by her husband, [name] [name].  She continues on letrozole, with good tolerance. She experiences hot flashes that aren't intense and do not wake her up at night. She reports feeling fatigued at times.   REVIEW OF SYSTEMS: [name] reports that she is doing well overall. She was recently diagnosed with gastroparesis by Dr. Juanita Craver and was prescribed Domperidone. She experienced intense bouts of nausea and noted that vomiting was the only relief. She noted that she also has type 2 diabetes. She denied having heart palpitations. She is being followed by Dr. Frederik Pear concerning right hip replacement. She reports that she has had pain in her hip for the last few years. She aids the pain with tylenol and a herbal supplement called Curamin, but she recently has developed peptic ulcers. For exercise, she walks occasionally, but has been limited due to the hip pain. She has an at-home gym with a treadmill and weights that she is able to use. Before her hip troubles, she  was able to walk up to 6 miles per day.  PAST MEDICAL HISTORY: Past Medical History:  Diagnosis Date   . Arthritis    back & hip- R  . Breast cancer ([place]) 04/15/12   left-biopsy  . Breast cancer, right breast ([place]) 05/14/12   right mastectomy  . Bronchitis   . Cancer ([place])    breast  . 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 CHF with unknown LVEF ([place]) 03/09/2013  . Diabetes mellitus   . Diabetes mellitus without complication ([place])   . GERD (gastroesophageal reflux disease)    pt. experiencing on occasional   . Heart murmur   . Hiatal hernia   . Osteopenia   . Pneumonia    treated as an outpt.   . Sinus problem   . Sleep apnea    sleep minimal concern- " along time ago"    PAST SURGICAL HISTORY: Past Surgical History:  Procedure Laterality Date  . BUNIONECTOMY Bilateral   . CESAREAN SECTION  1981, 1983  . FOOT SURGERY    . GASTRIC BYPASS     1st surgery- at [facility], then an emergent surgery here  [facility]- for perforation of stomach & then a surgery for debridement   . TONSILLECTOMY    . tummy tuck  1999    FAMILY HISTORY Family History  Problem Relation Age of Onset  . Heart attack Father   . Diabetes Father   . Heart attack Brother   . Diabetes Brother   . Diabetes Sister   . Breast cancer Sister 41  . Diabetes Mother   . Breast cancer Mother 64  . Parkinson's disease Sister   . Heart attack Brother    the patient's father died 2 days after prostate cancer surgery, possibly from a blood clot. The patient's mother lived to be 55 years old. She had been diagnosed with breast cancer around the age of 43. The patient has 3 brothers, 4 sisters. One sister was diagnosed with breast cancer at the age of 17. There is no history of ovarian cancer in the family. The patient underwent genetic testing October 2014, with a VUS as the only finding  GYNECOLOGIC HISTORY:  Menarche age 26, first live birth age 19, the patient is [place] P2. She went through the change of life approximately age 14. 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 He is Broadus John "Joe" Stanzione, who is a former judge and currently a Architectural technologist. The patient's daughter Garnette Scheuermann is an attorney in [place]. The patient's son Broadus John completed a Masters in Librarian, academic in [place]. The patient has 2 granddaughters.    ADVANCED DIRECTIVES: in place   HEALTH MAINTENANCE: Social History   Tobacco Use  . Smoking status: Former Research scientist (life sciences)  . Smokeless tobacco: Never Used  Substance Use Topics  . Alcohol use: Yes    Alcohol/week: 1.2 - 1.8 oz    Types: 2 - 3 Glasses of wine per week    Comment: no liquor  . Drug use: No     Colonoscopy:  PAP:  Bone density: January 2015  Lipid panel:  Allergies  Allergen Reactions  . Darvon Hives and Itching  . Penicillins Hives and Itching    Has patient had a PCN reaction causing immediate rash, facial/tongue/throat swelling, SOB or lightheadedness with hypotension: No Has patient had a PCN reaction causing severe rash involving mucus membranes or skin necrosis: No Has  patient had a PCN reaction that required hospitalization: No Has patient had a PCN reaction occurring within the last 10 years: No If all of the above answers are "NO", then may proceed with Cephalosporin use.     Current Outpatient Medications  Medication Sig Dispense Refill  . acetaminophen (TYLENOL) 650 MG CR tablet Take 1,300 mg by mouth every 8 (eight) hours as needed for pain.    . Ascorbic Acid (VITAMIN C) 500 MG tablet Take 500 mg by mouth daily.      . Calcium Citrate (CITRACAL PO) Take 1 tablet by mouth daily.     . Cholecalciferol (VITAMIN D PO) Take 1 tablet by mouth daily.    Marland Kitchen KRILL OIL PO Take 1 capsule by mouth daily.     Marland Kitchen letrozole (FEMARA) 2.5 MG tablet TAKE 1 TABLET (2.5 MG TOTAL) BY MOUTH DAILY. 90 tablet 2  . Multiple Vitamin (MULTIVITAMIN PO) Take 1 tablet by mouth daily.     . Probiotic Product (PROBIOTIC PO) Take 1 capsule by mouth daily.    . rosuvastatin  (CRESTOR) 5 MG tablet Take 1 tablet (5 mg total) by mouth daily at 6 PM. (Patient taking differently: Take 5 mg by mouth daily. ) 30 tablet 6  . sitaGLIPtin (JANUVIA) 100 MG tablet Take 100 mg by mouth daily.    . traMADol (ULTRAM) 50 MG tablet Take 50 mg by mouth every 6 (six) hours as needed for moderate pain.     No current facility-administered medications for this visit.     OBJECTIVE: Middle-aged [place] American woman walking with a cane Vitals:   12/30/16 1102  BP: 133/86  Pulse: 83  Resp: 20  Temp: 97.6 F (36.4 C)  SpO2: 100%     Body mass index is 31.72 kg/m.    ECOG FS:2 - Symptomatic, <50% confined to bed  .Sclerae unicteric, EOMs intact Oropharynx clear and moist No cervical or supraclavicular adenopathy Lungs no rales or rhonchi Heart regular rate and rhythm Abd soft, nontender, positive bowel sounds MSK no focal spinal tenderness, no upper extremity lymphedema Neuro: nonfocal, well oriented, appropriate affect Breasts:.  She has undergone bilateral mastectomies.  She has bilateral implants in place.  There is no evidence of local recurrence.  Both axillae are benign.   LAB RESULTS:  CMP     Component Value Date/Time   NA 142 05/15/2016 1526   NA 143 01/01/2016 1117   K 3.5 05/15/2016 1526   K 3.7 01/01/2016 1117   CL 100 (L) 05/15/2016 1526   CL 104 07/30/2012 0955   CO2 29 05/15/2016 1526   CO2 28 01/01/2016 1117   GLUCOSE 130 (H) 05/15/2016 1526   GLUCOSE 99 01/01/2016 1117   GLUCOSE 138 (H) 07/30/2012 0955   BUN <5 (L) 05/15/2016 1526   BUN 8.9 01/01/2016 1117   CREATININE 0.68 05/15/2016 1526   CREATININE 0.7 01/01/2016 1117   CALCIUM 10.4 (H) 05/15/2016 1526   CALCIUM 10.3 01/01/2016 1117   PROT 7.3 01/01/2016 1117   ALBUMIN 3.7 01/01/2016 1117   AST 23 01/01/2016 1117   ALT 21 01/01/2016 1117   ALKPHOS 120 01/01/2016 1117   BILITOT 0.49 01/01/2016 1117   GFRNONAA >60 05/15/2016 1526   GFRAA >60 05/15/2016 1526    I No results found  for: SPEP  Lab Results  Component Value Date   WBC 5.2 12/30/2016   NEUTROABS 2.7 12/30/2016   HGB 13.2 12/30/2016   HCT 40.1 12/30/2016   MCV 84.7 12/30/2016  PLT 278 12/30/2016      Chemistry      Component Value Date/Time   NA 142 05/15/2016 1526   NA 143 01/01/2016 1117   K 3.5 05/15/2016 1526   K 3.7 01/01/2016 1117   CL 100 (L) 05/15/2016 1526   CL 104 07/30/2012 0955   CO2 29 05/15/2016 1526   CO2 28 01/01/2016 1117   BUN <5 (L) 05/15/2016 1526   BUN 8.9 01/01/2016 1117   CREATININE 0.68 05/15/2016 1526   CREATININE 0.7 01/01/2016 1117      Component Value Date/Time   CALCIUM 10.4 (H) 05/15/2016 1526   CALCIUM 10.3 01/01/2016 1117   ALKPHOS 120 01/01/2016 1117   AST 23 01/01/2016 1117   ALT 21 01/01/2016 1117   BILITOT 0.49 01/01/2016 1117       Lab Results  Component Value Date   LABCA2 9 05/10/2012    No components found for: OIBBC488  No results for input(s): INR in the last 168 hours.  Urinalysis    Component Value Date/Time   COLORURINE YELLOW 05/15/2016 1525   APPEARANCEUR HAZY (A) 05/15/2016 1525   LABSPEC 1.009 05/15/2016 1525   PHURINE 7.0 05/15/2016 1525   GLUCOSEU NEGATIVE 05/15/2016 1525   HGBUR NEGATIVE 05/15/2016 1525   BILIRUBINUR NEGATIVE 05/15/2016 1525   KETONESUR NEGATIVE 05/15/2016 1525   PROTEINUR NEGATIVE 05/15/2016 1525   UROBILINOGEN 0.2 05/10/2012 1444   NITRITE NEGATIVE 05/15/2016 1525   LEUKOCYTESUR LARGE (A) 05/15/2016 1525    STUDIES:  Nm Gastric Emptying  Result Date: 12/11/2016 CLINICAL DATA:  Nausea, vomiting, bloating, reflux, diabetes mellitus, food "sits in her throat", symptoms especially after dinner EXAM: NUCLEAR MEDICINE GASTRIC EMPTYING SCAN TECHNIQUE: After oral ingestion of radiolabeled meal, sequential abdominal images were obtained for 4 hours. Percentage of activity emptying the stomach was calculated at 1 hour, 2 hour, 3 hour, and 4 hours. RADIOPHARMACEUTICALS:  2.0 mCi Tc-84msulfur colloid in  standardized meal COMPARISON:  None FINDINGS: Expected location of the stomach in the left upper quadrant. Ingested meal empties the stomach gradually over the course of the study. 11% emptied at 1 hr ( normal >= 10%) 19% emptied at 2 hr ( normal >= 40%) 48% emptied at 3 hr ( normal >= 70%) 67% emptied at 4 hr ( normal >= 90%) IMPRESSION: Delayed gastric emptying. Electronically Signed   By: MLavonia DanaM.D.   On: 12/11/2016 13:49    ASSESSMENT: 66y.o. BRCA negative  woman status post bilateral mastectomies 05/14/2012  (a) on the left, mpT1c pN0, stage IA invasive ductal carcinoma, grade 2, estrogen receptor 100% positive, progesterone receptor 11% positive, with an MIB-1 of 80%, and HER-2 amplified, the ratio being 6.50.  (b) on the right ,pT1c pN0, stage IA invasive ductal carcinoma, grade 3, triple negative, with an MIB-1 of 92%   (1) treated adjuvantly with doxorubicin and cyclophosphamide in dose dense fashion x4, completed 08/13/2012, followed by weekly Abraxane x12 given with concurrent trastuzumab, completed 12/03/2012  (2) received trastuzumab between 08/27/2012 and 06/29/2013, discontinued because of concerns regarding weakening of the heart muscle  (a) echo 07/25/2013 showed an ejection fraction of 55-60%  (3) letrozole started November 2014; with fatigue and multiple arthralgias/myalgias, letrozole was held for 6 weeks with no improvement in symptoms, and so it was resumed.   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 There were no deleterious mutations in the genetic panel tested, which included ATM, BARD1, BRCA1, BRCA2, BRIP1, CDH1, CHEK2, EPCAM, FANCC, MLH1, MSH2, MSH6, NBN, PALB2, PMS2, PTEN, RAD51C, RAD51D, STK11, TP53, and XRCC2   PLAN: [name] is now 4-1/2 years out from definitive surgery  for her breast cancer, with no evidence of disease recurrence.  This is very favorable.  She continues on letrozole, with excellent tolerance.  The major problems she is having of course are related to her arthritis.  Hopefully the hip replacement will allow her to get back on a better exercise routine.  She will see me one last time a year from now.  At that time she will be ready to "graduate" from breast cancer follow-up  She knows to call for any other issues that may develop before the next visit. Magrinat, Virgie Dad, MD  12/30/16 11:39 AM Medical Oncology and Hematology [facility] [address] [place], [place] [address] Tel. [phone]    Fax. [phone]  This document serves as a record of services personally performed by Lurline Del, MD. It was created on his behalf by Sheron Nightingale, a trained medical scribe. The creation of this record is based on the scribe's personal observations and the provider's statements to them.   I have reviewed the above documentation for accuracy and completeness, and I agree with the above.

## 2016-12-30 NOTE — Telephone Encounter (Signed)
Gave patient avs and calendar with appts per 11/13 los.

## 2017-01-30 DIAGNOSIS — M1611 Unilateral primary osteoarthritis, right hip: Secondary | ICD-10-CM | POA: Diagnosis not present

## 2017-01-30 DIAGNOSIS — E559 Vitamin D deficiency, unspecified: Secondary | ICD-10-CM | POA: Diagnosis not present

## 2017-01-30 DIAGNOSIS — Z6834 Body mass index (BMI) 34.0-34.9, adult: Secondary | ICD-10-CM | POA: Diagnosis not present

## 2017-01-30 DIAGNOSIS — E1165 Type 2 diabetes mellitus with hyperglycemia: Secondary | ICD-10-CM | POA: Diagnosis not present

## 2017-02-05 DIAGNOSIS — M1611 Unilateral primary osteoarthritis, right hip: Secondary | ICD-10-CM | POA: Diagnosis not present

## 2017-02-16 ENCOUNTER — Telehealth: Payer: Self-pay | Admitting: Oncology

## 2017-02-16 NOTE — Telephone Encounter (Signed)
FAXED RECORDS TO DR Baird Cancer

## 2017-02-18 ENCOUNTER — Other Ambulatory Visit: Payer: Self-pay | Admitting: Orthopedic Surgery

## 2017-02-24 NOTE — Pre-Procedure Instructions (Signed)
[name] [name]  02/24/2017      [id number] [address], [place] [address] Phone: [phone] Fax: [phone]    Your procedure is scheduled on March 02, 2017.  Report to [facility] Admitting at 845 AM.  Call this number if you have problems the morning of surgery:  [phone]   Remember:  Do not eat food or drink liquids after midnight.  Take these medicines the morning of surgery with A SIP OF WATER letrozole (femara),   7 days prior to surgery STOP taking any Aspirin (unless otherwise instructed by your surgeon), Aleve, Naproxen, Ibuprofen, Motrin, Advil, Goody's, BC's, all herbal medications, fish oil, and all vitamins  Continue all other medications as instructed by your physician except follow the above medication instructions before surgery    WHAT DO I DO ABOUT MY DIABETES MEDICATION?  Marland Kitchen Do not take oral diabetes medicines (pills) the morning of surgery Sitagliptin (Januvia).  How to Manage Your Diabetes Before and After Surgery  Why is it important to control my blood sugar before and after surgery? . Improving blood sugar levels before and after surgery helps healing and can limit problems. . A way of improving blood sugar control is eating a healthy diet by: o  Eating less sugar and carbohydrates o  Increasing activity/exercise o  Talking with your doctor about reaching your blood sugar goals . High blood sugars (greater than 180 mg/dL) can raise your risk of infections and slow your recovery, so you will need to focus on controlling your diabetes during the weeks before surgery. . Make sure that the doctor who takes care of your diabetes knows about your planned surgery including the date and location.  How do I manage my blood sugar before surgery? . Check your blood sugar at least 4 times a day, starting 2 days before surgery, to make sure that the level is not too high or  low. o Check your blood sugar the morning of your surgery when you wake up and every 2 hours until you get to the Short Stay unit. . If your blood sugar is less than 70 mg/dL, you will need to treat for low blood sugar: o Do not take insulin. o Treat a low blood sugar (less than 70 mg/dL) with  cup of clear juice (cranberry or apple), 4 glucose tablets, OR glucose gel. Recheck blood sugar in 15 minutes after treatment (to make sure it is greater than 70 mg/dL). If your blood sugar is not greater than 70 mg/dL on recheck, call [phone] o  for further instructions. . Report your blood sugar to the short stay nurse when you get to Short Stay.  . If you are admitted to the hospital after surgery: o Your blood sugar will be checked by the staff and you will probably be given insulin after surgery (instead of oral diabetes medicines) to make sure you have good blood sugar levels. o The goal for blood sugar control after surgery is 80-180 mg/dL  Reviewed and Endorsed by [facility] Patient Education Committee, August 2015   Do not wear jewelry, make-up or nail polish.  Do not wear lotions, powders, or perfumes, or deodorant.  Do not shave 48 hours prior to surgery.  Men may shave face and neck.  Do not bring valuables to the hospital.  [facility] is not responsible for any belongings or valuables.  Contacts, dentures or bridgework may not be worn  into surgery.  Leave your suitcase in the car.  After surgery it may be brought to your room.  For patients admitted to the hospital, discharge time will be determined by your treatment team.  Patients discharged the day of surgery will not be allowed to drive home.   Special instructions:   [place]- Preparing For Surgery  Before surgery, you can play an important role. Because skin is not sterile, your skin needs to be as free of germs as possible. You can reduce the number of germs on your skin by washing with CHG (chlorahexidine  gluconate) Soap before surgery.  CHG is an antiseptic cleaner which kills germs and bonds with the skin to continue killing germs even after washing.  Please do not use if you have an allergy to CHG or antibacterial soaps. If your skin becomes reddened/irritated stop using the CHG.  Do not shave (including legs and underarms) for at least 48 hours prior to first CHG shower. It is OK to shave your face.  Please follow these instructions carefully.   1. Shower the NIGHT BEFORE SURGERY and the MORNING OF SURGERY with CHG.   2. If you chose to wash your hair, wash your hair first as usual with your normal shampoo.  3. After you shampoo, rinse your hair and body thoroughly to remove the shampoo.  4. Use CHG as you would any other liquid soap. You can apply CHG directly to the skin and wash gently with a scrungie or a clean washcloth.   5. Apply the CHG Soap to your body ONLY FROM THE NECK DOWN.  Do not use on open wounds or open sores. Avoid contact with your eyes, ears, mouth and genitals (private parts). Wash Face and genitals (private parts)  with your normal soap.  6. Wash thoroughly, paying special attention to the area where your surgery will be performed.  7. Thoroughly rinse your body with warm water from the neck down.  8. DO NOT shower/wash with your normal soap after using and rinsing off the CHG Soap.  9. Pat yourself dry with a CLEAN TOWEL.  10. Wear CLEAN PAJAMAS to bed the night before surgery, wear comfortable clothes the morning of surgery  11. Place CLEAN SHEETS on your bed the night of your first shower and DO NOT SLEEP WITH PETS.  Day of Surgery: Do not apply any deodorants/lotions. Please wear clean clothes to the hospital/surgery center.    Please read over the following fact sheets that you were given. Pain Booklet, Coughing and Deep Breathing, MRSA Information and Surgical Site Infection Prevention

## 2017-02-25 ENCOUNTER — Other Ambulatory Visit: Payer: Self-pay

## 2017-02-25 ENCOUNTER — Encounter (HOSPITAL_COMMUNITY)
Admission: RE | Admit: 2017-02-25 | Discharge: 2017-02-25 | Disposition: A | Discharge status: Home / Self Care | Payer: Medicare Other | Source: Ambulatory Visit | Attending: Orthopedic Surgery | Admitting: Orthopedic Surgery

## 2017-02-25 ENCOUNTER — Encounter (HOSPITAL_COMMUNITY): Payer: Self-pay

## 2017-02-25 DIAGNOSIS — Z0183 Encounter for blood typing: Secondary | ICD-10-CM | POA: Diagnosis not present

## 2017-02-25 DIAGNOSIS — M1611 Unilateral primary osteoarthritis, right hip: Secondary | ICD-10-CM | POA: Diagnosis not present

## 2017-02-25 DIAGNOSIS — Z01812 Encounter for preprocedural laboratory examination: Secondary | ICD-10-CM | POA: Diagnosis not present

## 2017-02-25 LAB — BASIC METABOLIC PANEL
Anion gap: 10 (ref 5–15)
BUN: 8 mg/dL (ref 6–20)
CO2: 28 mmol/L (ref 22–32)
Calcium: 10 mg/dL (ref 8.9–10.3)
Chloride: 102 mmol/L (ref 101–111)
Creatinine, Ser: 0.64 mg/dL (ref 0.44–1.00)
GFR calc Af Amer: >60 mL/min (ref >60)
GFR calc non Af Amer: >60 mL/min (ref >60)
Glucose, Bld: 140 mg/dL — ABNORMAL HIGH (ref 65–99)
Potassium: 3.8 mmol/L (ref 3.5–5.1)
Sodium: 140 mmol/L (ref 135–145)

## 2017-02-25 LAB — CBC WITH DIFFERENTIAL/PLATELET
Basophils Absolute: 0 10*3/uL (ref 0.0–0.1)
Basophils Relative: 0 %
Eosinophils Absolute: 0.1 10*3/uL (ref 0.0–0.7)
Eosinophils Relative: 2 %
HCT: 42 % (ref 36.0–46.0)
Hemoglobin: 13.2 g/dL (ref 12.0–15.0)
Lymphocytes Relative: 34 %
Lymphs Abs: 2 10*3/uL (ref 0.7–4.0)
MCH: 27.6 pg (ref 26.0–34.0)
MCHC: 31.4 g/dL (ref 30.0–36.0)
MCV: 87.9 fL (ref 78.0–100.0)
Monocytes Absolute: 0.5 10*3/uL (ref 0.1–1.0)
Monocytes Relative: 9 %
Neutro Abs: 3.4 10*3/uL (ref 1.7–7.7)
Neutrophils Relative %: 55 %
Platelets: 308 10*3/uL (ref 150–400)
RBC: 4.78 MIL/uL (ref 3.87–5.11)
RDW: 13.5 % (ref 11.5–15.5)
WBC: 6 10*3/uL (ref 4.0–10.5)

## 2017-02-25 LAB — URINALYSIS, MICROSCOPIC (REFLEX)
Bacteria, UA: NONE SEEN
RBC / HPF: NONE SEEN RBC/hpf (ref 0–5)

## 2017-02-25 LAB — GLUCOSE, CAPILLARY: Glucose-Capillary: 107 mg/dL — ABNORMAL HIGH (ref 65–99)

## 2017-02-25 LAB — URINALYSIS, ROUTINE W REFLEX MICROSCOPIC
Bilirubin Urine: NEGATIVE
Glucose, UA: NEGATIVE mg/dL
Hgb urine dipstick: NEGATIVE
Ketones, ur: NEGATIVE mg/dL
Nitrite: NEGATIVE
Protein, ur: NEGATIVE mg/dL
Specific Gravity, Urine: <1.005 — ABNORMAL LOW (ref 1.005–1.030)
pH: 5.5 (ref 5.0–8.0)

## 2017-02-25 LAB — APTT: aPTT: 34 seconds (ref 24–36)

## 2017-02-25 LAB — TYPE AND SCREEN
ABO/RH(D): A POS
Antibody Screen: NEGATIVE

## 2017-02-25 LAB — PROTIME-INR
INR: 1.07
Prothrombin Time: 13.9 seconds (ref 11.4–15.2)

## 2017-02-25 LAB — SURGICAL PCR SCREEN
MRSA, PCR: NEGATIVE
Staphylococcus aureus: NEGATIVE

## 2017-02-25 NOTE — Progress Notes (Signed)
Pt. Reports based on uneasy sensation in her chest from last chemo agent used , they discontinued the therapy. ECHO report in Epic.  Pt. Followed by Dr. [name], for PCP, call to the office for the last HgA1c, left voicemail.  Call back from Dr. Baird Cancer & she reports the HgbA1c was 7.5 on last tracking.  She will fax to [facility]- [phone].

## 2017-02-27 ENCOUNTER — Other Ambulatory Visit: Payer: Self-pay | Admitting: Orthopedic Surgery

## 2017-02-27 DIAGNOSIS — M1611 Unilateral primary osteoarthritis, right hip: Secondary | ICD-10-CM | POA: Diagnosis present

## 2017-02-27 MED ORDER — TRANEXAMIC ACID 1000 MG/10ML IV SOLN
2000.0000 mg | INTRAVENOUS | Status: DC
  Filled 2017-02-27: qty 20

## 2017-02-27 NOTE — H&P (Signed)
TOTAL HIP ADMISSION H&P  Patient is admitted for right total hip arthroplasty.  Subjective:  Chief Complaint: right hip pain  HPI: [name] [name], 67 y.o. female, has a history of pain and functional disability in the right hip(s) due to arthritis and patient has failed non-surgical conservative treatments for greater than 12 weeks to include NSAID's and/or analgesics, flexibility and strengthening excercises, use of assistive devices and activity modification.  Onset of symptoms was gradual starting 7 years ago with gradually worsening course since that time.The patient noted no past surgery on the right hip(s).  Patient currently rates pain in the right hip at 10 out of 10 with activity. Patient has night pain, worsening of pain with activity and weight bearing, trendelenberg gait, pain that interfers with activities of daily living and pain with passive range of motion. Patient has evidence of subchondral cysts and joint space narrowing by imaging studies. This condition presents safety issues increasing the risk of falls.  There is no current active infection.  Patient Active Problem List   Diagnosis Date Noted  . Genetic testing 11/17/2014  . Vaginal dryness 07/25/2014  . 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 Dyspnea 07/19/2013  . Chemotherapy adverse reaction 07/14/2013  . Breast cancer, right ([place]) 05/14/2012  . Cancer of left breast ([place]) 04/20/2012  . Hyperlipidemia 02/01/2012  . GERD (gastroesophageal reflux disease) 02/01/2012  . Diabetes mellitus ([place]) 02/01/2012  . Osteopenia    Past Medical History:  Diagnosis Date  . Arthritis    back & hip- R  . Breast cancer ([place]) 04/15/12   left-biopsy  . Breast cancer, right breast ([place]) 05/14/12   right mastectomy  . Bronchitis   . Cancer ([place])    breast  . Chemotherapy-induced cardiomyopathy ([place])   . CHF with unknown LVEF ([place]) 03/09/2013  . Diabetes mellitus   . Diabetes mellitus without complication  ([place])   . GERD (gastroesophageal reflux disease)    pt. experiencing on occasional , h/o og gastroparesis   . Heart murmur   . Hiatal hernia   . Osteopenia   . Pneumonia    treated as an outpt.   . Sinus problem   . Sleep apnea    sleep minimal concern- " along time ago"    Past Surgical History:  Procedure Laterality Date  . AXILLARY SENTINEL NODE BIOPSY Left 05/14/2012   Procedure: AXILLARY SENTINEL NODE BIOPSY;  Surgeon: Adin Hector, MD;  Location: [place];  Service: General;  Laterality: Left;  . BREAST RECONSTRUCTION WITH PLACEMENT OF TISSUE EXPANDER AND FLEX HD (ACELLULAR HYDRATED DERMIS) Bilateral 05/14/2012   Procedure: BREAST RECONSTRUCTION WITH PLACEMENT OF TISSUE EXPANDER AND FLEX HD (ACELLULAR HYDRATED DERMIS);  Surgeon: Crissie Reese, MD;  Location: [place];  Service: Plastics;  Laterality: Bilateral;  . BUNIONECTOMY Bilateral   . CESAREAN SECTION  1981, 1983  . FOOT SURGERY    . GASTRIC BYPASS     1st surgery- at [facility], then an emergent surgery here  [facility]- for perforation of stomach & then a surgery for debridement   . PORT-A-CATH REMOVAL Right 09/12/2013   Procedure: REMOVAL PORT-A-CATH;  Surgeon: Adin Hector, MD;  Location: [place];  Service: General;  Laterality: Right;  . PORTACATH PLACEMENT N/A 05/14/2012   Procedure: INSERTION PORT-A-CATH;  Surgeon: Adin Hector, MD;  Location: [place];  Service: General;  Laterality: N/A;  . TONSILLECTOMY    . TOTAL MASTECTOMY Bilateral 05/14/2012   Procedure: TOTAL MASTECTOMY;  Surgeon: Renelda Loma  Alyssa Grove, MD;  Location: [place];  Service: General;  Laterality: Bilateral;  . tummy tuck  1999  . WOUND DEBRIDEMENT  01/14/2011   Procedure: DEBRIDEMENT ABDOMINAL WOUND;  Surgeon: Adin Hector, MD;  Location: [place];  Service: General;  Laterality: N/A;  wound debridement and debridement on the abdomen    Current Facility-Administered Medications  Medication Dose Route Frequency Provider  Last Rate Last Dose  . [START ON 03/02/2017] tranexamic acid (CYKLOKAPRON) 2,000 mg in sodium chloride 0.9 % 50 mL Topical Application  4,098 mg Topical To OR Frederik Pear, MD       Current Outpatient Medications  Medication Sig Dispense Refill Last Dose  . Ascorbic Acid (VITAMIN C) 500 MG tablet Take 500 mg by mouth daily.     Taking  . Cholecalciferol (VITAMIN D PO) Take 1 tablet by mouth daily.   Taking  . Glucos-Chond-Hyal Ac-Ca Fructo (MOVE FREE JOINT HEALTH ADVANCE PO) Take 1 tablet by mouth daily.     Marland Kitchen KRILL OIL PO Take 1 capsule by mouth daily.    Taking  . letrozole (FEMARA) 2.5 MG tablet TAKE 1 TABLET (2.5 MG TOTAL) BY MOUTH DAILY. 90 tablet 2   . magnesium oxide (MAG-OX) 400 MG tablet Take 400 mg by mouth daily.     . Menthol, Topical Analgesic, (BLUE-EMU MAXIMUM STRENGTH EX) Apply 1 application topically daily.     . Multiple Vitamin (MULTIVITAMIN PO) Take 1 tablet by mouth daily.    Taking  . PRESCRIPTION MEDICATION Take 1 tablet by mouth every evening. Domperidone 10 mg     . Probiotic Product (PROBIOTIC PO) Take 1 capsule by mouth daily.     . rosuvastatin (CRESTOR) 5 MG tablet Take 1 tablet (5 mg total) by mouth daily at 6 PM. (Patient taking differently: Take 5 mg by mouth daily. ) 30 tablet 6 Taking  . sitaGLIPtin (JANUVIA) 100 MG tablet Take 100 mg by mouth daily.   Taking  . Turmeric (CURCUMIN 95 PO) Take 1 capsule by mouth daily.      Allergies  Allergen Reactions  . Darvon Hives and Itching  . Penicillins Hives and Itching    Has patient had a PCN reaction causing immediate rash, facial/tongue/throat swelling, SOB or lightheadedness with hypotension: No Has patient had a PCN reaction causing severe rash involving mucus membranes or skin necrosis: No Has patient had a PCN reaction that required hospitalization: No Has patient had a PCN reaction occurring within the last 10 years: No If all of the above answers are "NO", then may proceed with Cephalosporin use.      Social History   Tobacco Use  . Smoking status: Former Smoker    Last attempt to quit: 02/25/2006    Years since quitting: 11.0  . Smokeless tobacco: Never Used  Substance Use Topics  . Alcohol use: Yes    Alcohol/week: 1.2 - 1.8 oz    Types: 2 - 3 Glasses of wine per week    Comment: no liquor    Family History  Problem Relation Age of Onset  . Heart attack Father   . Diabetes Father   . Heart attack Brother   . Diabetes Brother   . Diabetes Sister   . Breast cancer Sister 67  . Diabetes Mother   . Breast cancer Mother 46  . Parkinson's disease Sister   . Heart attack Brother      Review of Systems  Constitutional: Positive for diaphoresis and malaise/fatigue.  HENT: Negative.   Eyes: Negative.   Respiratory: Negative.   Cardiovascular:       Heart murmur  Gastrointestinal: Positive for heartburn, nausea and vomiting.  Genitourinary: Negative.   Musculoskeletal: Positive for joint pain and myalgias.  Neurological: Negative.   Endo/Heme/Allergies: Negative.   Psychiatric/Behavioral: Negative.     Objective:  Physical Exam  Constitutional: She is oriented to person, place, and time. She appears well-developed and well-nourished.  HENT:  Head: Normocephalic and atraumatic.  Eyes: Pupils are equal, round, and reactive to light.  Neck: Normal range of motion. Neck supple.  Cardiovascular: Intact distal pulses.  Respiratory: Effort normal.  Musculoskeletal: She exhibits tenderness.  She walks with an antalgic gait even with the use of a cane.  Any attempts at internal rotation of the right hip causes severe pain.    Neurological: She is alert and oriented to person, place, and time.  Skin: Skin is warm and dry.  Psychiatric: She has a normal mood and affect. Her behavior is normal. Judgment and thought content normal.    Vital signs in last 24 hours:    Labs:   Estimated body mass index is 33.54 kg/m as calculated from the following:   Height as of  02/25/17: 5\' 4"  (1.626 m).   Weight as of 02/25/17: 88.6 kg (195 lb 6 oz).   Imaging Review Plain radiographs demonstrate AP pelvis shows end-stage arthritis of the right hip which is a fairly dramatic change from x-rays done in November that showed 2-3 mm of remaining cartilage.  She is now bone-on-bone there is a small cyst in the supra-acetabular region and a lateral bone spur on the femoral head.  Assessment/Plan:  End stage arthritis, right hip(s)  The patient history, physical examination, clinical judgement of the provider and imaging studies are consistent with end stage degenerative joint disease of the right hip(s) and total hip arthroplasty is deemed medically necessary. The treatment options including medical management, injection therapy, arthroscopy and arthroplasty were discussed at length. The risks and benefits of total hip arthroplasty were presented and reviewed. The risks due to aseptic loosening, infection, stiffness, dislocation/subluxation,  thromboembolic complications and other imponderables were discussed.  The patient acknowledged the explanation, agreed to proceed with the plan and consent was signed. Patient is being admitted for inpatient treatment for surgery, pain control, PT, OT, prophylactic antibiotics, VTE prophylaxis, progressive ambulation and ADL's and discharge planning.The patient is planning to be discharged home with home health services.

## 2017-03-02 ENCOUNTER — Inpatient Hospital Stay (HOSPITAL_COMMUNITY): Payer: Medicare Other

## 2017-03-02 ENCOUNTER — Inpatient Hospital Stay (HOSPITAL_COMMUNITY): Payer: Medicare Other | Admitting: Certified Registered"

## 2017-03-02 ENCOUNTER — Encounter (HOSPITAL_COMMUNITY)
Admission: RE | Disposition: A | Discharge status: Home Health | Payer: Self-pay | Source: Ambulatory Visit | Attending: Orthopedic Surgery

## 2017-03-02 ENCOUNTER — Encounter (HOSPITAL_COMMUNITY): Payer: Self-pay | Admitting: Surgery

## 2017-03-02 ENCOUNTER — Other Ambulatory Visit: Payer: Self-pay

## 2017-03-02 ENCOUNTER — Inpatient Hospital Stay (HOSPITAL_COMMUNITY)
Admission: RE | Admit: 2017-03-02 | Discharge: 2017-03-04 | DRG: 470 | Disposition: A | Discharge status: Home Health | Payer: Medicare Other | Source: Ambulatory Visit | Attending: Orthopedic Surgery | Admitting: Orthopedic Surgery

## 2017-03-02 DIAGNOSIS — Z833 Family history of diabetes mellitus: Secondary | ICD-10-CM | POA: Diagnosis not present

## 2017-03-02 DIAGNOSIS — Z471 Aftercare following joint replacement surgery: Secondary | ICD-10-CM | POA: Diagnosis not present

## 2017-03-02 DIAGNOSIS — Z9221 Personal history of antineoplastic chemotherapy: Secondary | ICD-10-CM

## 2017-03-02 DIAGNOSIS — Z79899 Other long term (current) drug therapy: Secondary | ICD-10-CM

## 2017-03-02 DIAGNOSIS — Z23 Encounter for immunization: Secondary | ICD-10-CM

## 2017-03-02 DIAGNOSIS — M1611 Unilateral primary osteoarthritis, right hip: Principal | ICD-10-CM | POA: Diagnosis present

## 2017-03-02 DIAGNOSIS — Z9011 Acquired absence of right breast and nipple: Secondary | ICD-10-CM | POA: Diagnosis not present

## 2017-03-02 DIAGNOSIS — Z01818 Encounter for other preprocedural examination: Secondary | ICD-10-CM | POA: Diagnosis not present

## 2017-03-02 DIAGNOSIS — Z853 Personal history of malignant neoplasm of breast: Secondary | ICD-10-CM

## 2017-03-02 DIAGNOSIS — Z9884 Bariatric surgery status: Secondary | ICD-10-CM

## 2017-03-02 DIAGNOSIS — E1143 Type 2 diabetes mellitus with diabetic autonomic (poly)neuropathy: Secondary | ICD-10-CM | POA: Diagnosis present

## 2017-03-02 DIAGNOSIS — K3184 Gastroparesis: Secondary | ICD-10-CM | POA: Diagnosis present

## 2017-03-02 DIAGNOSIS — K219 Gastro-esophageal reflux disease without esophagitis: Secondary | ICD-10-CM | POA: Diagnosis present

## 2017-03-02 DIAGNOSIS — Z888 Allergy status to other drugs, medicaments and biological substances status: Secondary | ICD-10-CM | POA: Diagnosis not present

## 2017-03-02 DIAGNOSIS — M25551 Pain in right hip: Secondary | ICD-10-CM | POA: Diagnosis not present

## 2017-03-02 DIAGNOSIS — Z9882 Breast implant status: Secondary | ICD-10-CM

## 2017-03-02 DIAGNOSIS — D62 Acute posthemorrhagic anemia: Secondary | ICD-10-CM | POA: Diagnosis not present

## 2017-03-02 DIAGNOSIS — E785 Hyperlipidemia, unspecified: Secondary | ICD-10-CM | POA: Diagnosis present

## 2017-03-02 DIAGNOSIS — Z6833 Body mass index (BMI) 33.0-33.9, adult: Secondary | ICD-10-CM

## 2017-03-02 DIAGNOSIS — E6609 Other obesity due to excess calories: Secondary | ICD-10-CM | POA: Diagnosis present

## 2017-03-02 DIAGNOSIS — Z419 Encounter for procedure for purposes other than remedying health state, unspecified: Secondary | ICD-10-CM

## 2017-03-02 DIAGNOSIS — E119 Type 2 diabetes mellitus without complications: Secondary | ICD-10-CM | POA: Diagnosis not present

## 2017-03-02 DIAGNOSIS — Z87891 Personal history of nicotine dependence: Secondary | ICD-10-CM | POA: Diagnosis not present

## 2017-03-02 DIAGNOSIS — K449 Diaphragmatic hernia without obstruction or gangrene: Secondary | ICD-10-CM | POA: Diagnosis not present

## 2017-03-02 DIAGNOSIS — Z880 Allergy status to penicillin: Secondary | ICD-10-CM | POA: Diagnosis not present

## 2017-03-02 DIAGNOSIS — Z8249 Family history of ischemic heart disease and other diseases of the circulatory system: Secondary | ICD-10-CM

## 2017-03-02 DIAGNOSIS — Z96641 Presence of right artificial hip joint: Secondary | ICD-10-CM | POA: Diagnosis not present

## 2017-03-02 DIAGNOSIS — Z803 Family history of malignant neoplasm of breast: Secondary | ICD-10-CM

## 2017-03-02 HISTORY — PX: TOTAL HIP ARTHROPLASTY: SHX124

## 2017-03-02 LAB — HEMOGLOBIN A1C
Hgb A1c MFr Bld: 7.1 % — ABNORMAL HIGH (ref 4.8–5.6)
Mean Plasma Glucose: 157.07 mg/dL

## 2017-03-02 LAB — GLUCOSE, CAPILLARY
Glucose-Capillary: 144 mg/dL — ABNORMAL HIGH (ref 65–99)
Glucose-Capillary: 130 mg/dL — ABNORMAL HIGH (ref 65–99)
Glucose-Capillary: 130 mg/dL — ABNORMAL HIGH (ref 65–99)
Glucose-Capillary: 225 mg/dL — ABNORMAL HIGH (ref 65–99)

## 2017-03-02 SURGERY — ARTHROPLASTY, HIP, TOTAL, ANTERIOR APPROACH
Anesthesia: Spinal | Site: Hip | Laterality: Right

## 2017-03-02 MED ORDER — HYDROMORPHONE HCL 1 MG/ML IJ SOLN
0.5000 mg | INTRAMUSCULAR | Status: DC | PRN
  Administered 2017-03-02 (×2): 0.5 mg via INTRAVENOUS
  Filled 2017-03-02 (×2): qty 1

## 2017-03-02 MED ORDER — ONDANSETRON HCL 4 MG PO TABS
4.0000 mg | ORAL_TABLET | Freq: Four times a day (QID) | ORAL | Status: DC | PRN
  Administered 2017-03-04: 4 mg via ORAL
  Filled 2017-03-02: qty 1

## 2017-03-02 MED ORDER — METHOCARBAMOL 500 MG PO TABS
ORAL_TABLET | ORAL | Status: AC
  Filled 2017-03-02: qty 1

## 2017-03-02 MED ORDER — TRANEXAMIC ACID 1000 MG/10ML IV SOLN
INTRAVENOUS | Status: AC | PRN
  Administered 2017-03-02: 2000 mg via TOPICAL

## 2017-03-02 MED ORDER — GABAPENTIN 300 MG PO CAPS
300.0000 mg | ORAL_CAPSULE | Freq: Three times a day (TID) | ORAL | Status: DC
  Administered 2017-03-02 – 2017-03-04 (×6): 300 mg via ORAL
  Filled 2017-03-02 (×6): qty 1

## 2017-03-02 MED ORDER — LACTATED RINGERS IV SOLN
INTRAVENOUS | Status: DC
  Administered 2017-03-02 (×2): via INTRAVENOUS

## 2017-03-02 MED ORDER — OXYCODONE-ACETAMINOPHEN 5-325 MG PO TABS: 1.0000 | ORAL_TABLET | ORAL | 0 refills | Status: DC | PRN

## 2017-03-02 MED ORDER — PROPOFOL 10 MG/ML IV BOLUS
INTRAVENOUS | Status: AC
  Filled 2017-03-02: qty 20

## 2017-03-02 MED ORDER — ROCURONIUM BROMIDE 100 MG/10ML IV SOLN
INTRAVENOUS | Status: DC | PRN
  Administered 2017-03-02: 45 mg via INTRAVENOUS

## 2017-03-02 MED ORDER — PROPOFOL 10 MG/ML IV BOLUS
INTRAVENOUS | Status: DC | PRN
  Administered 2017-03-02: 30 mg via INTRAVENOUS
  Administered 2017-03-02 (×2): 10 mg via INTRAVENOUS
  Administered 2017-03-02: 150 mg via INTRAVENOUS
  Administered 2017-03-02: 10 mg via INTRAVENOUS
  Administered 2017-03-02: 50 mg via INTRAVENOUS

## 2017-03-02 MED ORDER — PHENOL 1.4 % MT LIQD: 1.0000 | OROMUCOSAL | Status: DC | PRN

## 2017-03-02 MED ORDER — ONDANSETRON HCL 4 MG/2ML IJ SOLN
INTRAMUSCULAR | Status: DC | PRN
  Administered 2017-03-02: 4 mg via INTRAVENOUS

## 2017-03-02 MED ORDER — METOCLOPRAMIDE HCL 5 MG/ML IJ SOLN: 5.0000 mg | Freq: Three times a day (TID) | INTRAMUSCULAR | Status: DC | PRN

## 2017-03-02 MED ORDER — 0.9 % SODIUM CHLORIDE (POUR BTL) OPTIME
TOPICAL | Status: DC | PRN
  Administered 2017-03-02: 1000 mL

## 2017-03-02 MED ORDER — APIXABAN 2.5 MG PO TABS: 2.5000 mg | ORAL_TABLET | Freq: Two times a day (BID) | ORAL | 0 refills | Status: DC

## 2017-03-02 MED ORDER — PROPOFOL 1000 MG/100ML IV EMUL
INTRAVENOUS | Status: AC
  Filled 2017-03-02: qty 100

## 2017-03-02 MED ORDER — ONDANSETRON HCL 4 MG/2ML IJ SOLN: 4.0000 mg | Freq: Once | INTRAMUSCULAR | Status: DC | PRN

## 2017-03-02 MED ORDER — KCL IN DEXTROSE-NACL 20-5-0.45 MEQ/L-%-% IV SOLN
INTRAVENOUS | Status: DC
  Administered 2017-03-02: 17:00:00 via INTRAVENOUS
  Filled 2017-03-02: qty 1000

## 2017-03-02 MED ORDER — METHOCARBAMOL 500 MG PO TABS
500.0000 mg | ORAL_TABLET | Freq: Four times a day (QID) | ORAL | Status: DC | PRN
  Administered 2017-03-02 – 2017-03-04 (×6): 500 mg via ORAL
  Filled 2017-03-02 (×5): qty 1

## 2017-03-02 MED ORDER — BUPIVACAINE-EPINEPHRINE (PF) 0.5% -1:200000 IJ SOLN
INTRAMUSCULAR | Status: AC
  Filled 2017-03-02: qty 60

## 2017-03-02 MED ORDER — VANCOMYCIN HCL IN DEXTROSE 1-5 GM/200ML-% IV SOLN
1000.0000 mg | INTRAVENOUS | Status: AC
  Administered 2017-03-02: 1000 mg via INTRAVENOUS
  Filled 2017-03-02: qty 200

## 2017-03-02 MED ORDER — FENTANYL CITRATE (PF) 250 MCG/5ML IJ SOLN
INTRAMUSCULAR | Status: DC | PRN
  Administered 2017-03-02 (×2): 50 ug via INTRAVENOUS
  Administered 2017-03-02: 200 ug via INTRAVENOUS
  Administered 2017-03-02: 50 ug via INTRAVENOUS
  Administered 2017-03-02: 75 ug via INTRAVENOUS
  Administered 2017-03-02: 50 ug via INTRAVENOUS
  Administered 2017-03-02: 25 ug via INTRAVENOUS

## 2017-03-02 MED ORDER — TRANEXAMIC ACID 1000 MG/10ML IV SOLN
1000.0000 mg | INTRAVENOUS | Status: AC
  Administered 2017-03-02: 1000 mg via INTRAVENOUS
  Filled 2017-03-02: qty 10

## 2017-03-02 MED ORDER — SENNOSIDES-DOCUSATE SODIUM 8.6-50 MG PO TABS: 1.0000 | ORAL_TABLET | Freq: Every evening | ORAL | Status: DC | PRN

## 2017-03-02 MED ORDER — INSULIN ASPART 100 UNIT/ML ~~LOC~~ SOLN
0.0000 [IU] | Freq: Three times a day (TID) | SUBCUTANEOUS | Status: DC
  Administered 2017-03-02: 2 [IU] via SUBCUTANEOUS
  Administered 2017-03-03 (×2): 8 [IU] via SUBCUTANEOUS
  Administered 2017-03-03: 3 [IU] via SUBCUTANEOUS
  Administered 2017-03-04: 5 [IU] via SUBCUTANEOUS
  Administered 2017-03-04: 2 [IU] via SUBCUTANEOUS

## 2017-03-02 MED ORDER — MIDAZOLAM HCL 2 MG/2ML IJ SOLN
INTRAMUSCULAR | Status: DC | PRN
  Administered 2017-03-02: 1 mg via INTRAVENOUS

## 2017-03-02 MED ORDER — FENTANYL CITRATE (PF) 250 MCG/5ML IJ SOLN
INTRAMUSCULAR | Status: AC
Start: 2017-03-02 — End: ?
  Filled 2017-03-02: qty 5

## 2017-03-02 MED ORDER — LETROZOLE 2.5 MG PO TABS
2.5000 mg | ORAL_TABLET | Freq: Every day | ORAL | Status: DC
Start: 2017-03-03 — End: 2017-03-04
  Administered 2017-03-03 – 2017-03-04 (×2): 2.5 mg via ORAL
  Filled 2017-03-02 (×3): qty 1

## 2017-03-02 MED ORDER — DOCUSATE SODIUM 100 MG PO CAPS
100.0000 mg | ORAL_CAPSULE | Freq: Two times a day (BID) | ORAL | Status: DC
  Administered 2017-03-02 – 2017-03-04 (×5): 100 mg via ORAL
  Filled 2017-03-02 (×5): qty 1

## 2017-03-02 MED ORDER — APIXABAN 2.5 MG PO TABS
2.5000 mg | ORAL_TABLET | Freq: Two times a day (BID) | ORAL | Status: DC
  Administered 2017-03-03 – 2017-03-04 (×3): 2.5 mg via ORAL
  Filled 2017-03-02 (×3): qty 1

## 2017-03-02 MED ORDER — BUPIVACAINE-EPINEPHRINE (PF) 0.5% -1:200000 IJ SOLN
INTRAMUSCULAR | Status: DC | PRN
  Administered 2017-03-02: 30 mL
  Administered 2017-03-02: 20 mL

## 2017-03-02 MED ORDER — TIZANIDINE HCL 2 MG PO TABS: 2.0000 mg | ORAL_TABLET | Freq: Four times a day (QID) | ORAL | 0 refills | Status: DC | PRN

## 2017-03-02 MED ORDER — OXYCODONE HCL 5 MG PO TABS: 5.0000 mg | ORAL_TABLET | ORAL | Status: DC | PRN

## 2017-03-02 MED ORDER — METHOCARBAMOL 1000 MG/10ML IJ SOLN
500.0000 mg | Freq: Four times a day (QID) | INTRAVENOUS | Status: DC | PRN
  Filled 2017-03-02: qty 5

## 2017-03-02 MED ORDER — BUPIVACAINE LIPOSOME 1.3 % IJ SUSP
20.0000 mL | Freq: Once | INTRAMUSCULAR | Status: AC
Start: 2017-03-02 — End: 2017-03-02
  Administered 2017-03-02: 20 mL
  Filled 2017-03-02: qty 20

## 2017-03-02 MED ORDER — FENTANYL CITRATE (PF) 100 MCG/2ML IJ SOLN
25.0000 ug | INTRAMUSCULAR | Status: DC | PRN
  Administered 2017-03-02 (×2): 50 ug via INTRAVENOUS

## 2017-03-02 MED ORDER — ACETAMINOPHEN 650 MG RE SUPP: 650.0000 mg | RECTAL | Status: DC | PRN

## 2017-03-02 MED ORDER — METOCLOPRAMIDE HCL 5 MG PO TABS
5.0000 mg | ORAL_TABLET | Freq: Three times a day (TID) | ORAL | Status: DC | PRN
  Administered 2017-03-04: 10 mg via ORAL
  Filled 2017-03-02: qty 2

## 2017-03-02 MED ORDER — CHLORHEXIDINE GLUCONATE 4 % EX LIQD: 60.0000 mL | Freq: Once | CUTANEOUS | Status: DC

## 2017-03-02 MED ORDER — WHITE PETROLATUM EX OINT
TOPICAL_OINTMENT | CUTANEOUS | Status: AC
  Filled 2017-03-02: qty 28.35

## 2017-03-02 MED ORDER — MIDAZOLAM HCL 2 MG/2ML IJ SOLN
INTRAMUSCULAR | Status: AC
  Filled 2017-03-02: qty 2

## 2017-03-02 MED ORDER — DEXAMETHASONE SODIUM PHOSPHATE 10 MG/ML IJ SOLN
10.0000 mg | Freq: Once | INTRAMUSCULAR | Status: AC
  Administered 2017-03-03: 10 mg via INTRAVENOUS
  Filled 2017-03-02: qty 1

## 2017-03-02 MED ORDER — SUGAMMADEX SODIUM 200 MG/2ML IV SOLN
INTRAVENOUS | Status: DC | PRN
  Administered 2017-03-02: 177.2 mg via INTRAVENOUS

## 2017-03-02 MED ORDER — PROPOFOL 500 MG/50ML IV EMUL
INTRAVENOUS | Status: DC | PRN
  Administered 2017-03-02: 25 ug/kg/min via INTRAVENOUS

## 2017-03-02 MED ORDER — FENTANYL CITRATE (PF) 250 MCG/5ML IJ SOLN
INTRAMUSCULAR | Status: AC
  Filled 2017-03-02: qty 5

## 2017-03-02 MED ORDER — ALUM & MAG HYDROXIDE-SIMETH 200-200-20 MG/5ML PO SUSP: 30.0000 mL | ORAL | Status: DC | PRN

## 2017-03-02 MED ORDER — MENTHOL 3 MG MT LOZG: 1.0000 | LOZENGE | OROMUCOSAL | Status: DC | PRN

## 2017-03-02 MED ORDER — DIPHENHYDRAMINE HCL 12.5 MG/5ML PO ELIX: 12.5000 mg | ORAL_SOLUTION | ORAL | Status: DC | PRN

## 2017-03-02 MED ORDER — BISACODYL 5 MG PO TBEC: 5.0000 mg | DELAYED_RELEASE_TABLET | Freq: Every day | ORAL | Status: DC | PRN

## 2017-03-02 MED ORDER — LINAGLIPTIN 5 MG PO TABS
5.0000 mg | ORAL_TABLET | Freq: Every day | ORAL | Status: DC
  Administered 2017-03-02 – 2017-03-04 (×3): 5 mg via ORAL
  Filled 2017-03-02 (×3): qty 1

## 2017-03-02 MED ORDER — TRANEXAMIC ACID 1000 MG/10ML IV SOLN
1000.0000 mg | Freq: Once | INTRAVENOUS | Status: AC
  Administered 2017-03-02: 1000 mg via INTRAVENOUS
  Filled 2017-03-02: qty 1100

## 2017-03-02 MED ORDER — ONDANSETRON HCL 4 MG/2ML IJ SOLN: 4.0000 mg | Freq: Four times a day (QID) | INTRAMUSCULAR | Status: DC | PRN

## 2017-03-02 MED ORDER — ACETAMINOPHEN 325 MG PO TABS: 650.0000 mg | ORAL_TABLET | ORAL | Status: DC | PRN

## 2017-03-02 MED ORDER — FENTANYL CITRATE (PF) 100 MCG/2ML IJ SOLN
INTRAMUSCULAR | Status: AC
  Filled 2017-03-02: qty 2

## 2017-03-02 MED ORDER — PNEUMOCOCCAL VAC POLYVALENT 25 MCG/0.5ML IJ INJ
0.5000 mL | INJECTION | INTRAMUSCULAR | Status: AC
  Administered 2017-03-03: 0.5 mL via INTRAMUSCULAR
  Filled 2017-03-02: qty 0.5

## 2017-03-02 MED ORDER — OXYCODONE HCL 5 MG PO TABS
ORAL_TABLET | ORAL | Status: AC
  Filled 2017-03-02: qty 2

## 2017-03-02 MED ORDER — OXYCODONE HCL 5 MG PO TABS
10.0000 mg | ORAL_TABLET | ORAL | Status: DC | PRN
  Administered 2017-03-02 – 2017-03-04 (×11): 10 mg via ORAL
  Filled 2017-03-02 (×10): qty 2

## 2017-03-02 MED ORDER — FLEET ENEMA 7-19 GM/118ML RE ENEM: 1.0000 | ENEMA | Freq: Once | RECTAL | Status: DC | PRN

## 2017-03-02 MED ORDER — PHENYLEPHRINE HCL 10 MG/ML IJ SOLN
INTRAMUSCULAR | Status: DC | PRN
  Administered 2017-03-02: 10 ug/min via INTRAVENOUS

## 2017-03-02 SURGICAL SUPPLY — 46 items
BAG DECANTER FOR FLEXI CONT (MISCELLANEOUS) ×2 IMPLANT
BLADE SAW SGTL 18X1.27X75 (BLADE) ×2 IMPLANT
CAPT HIP TOTAL 2 ×1 IMPLANT
COVER PERINEAL POST (MISCELLANEOUS) ×2 IMPLANT
COVER SURGICAL LIGHT HANDLE (MISCELLANEOUS) ×2 IMPLANT
DECANTER SPIKE VIAL GLASS SM (MISCELLANEOUS) ×1 IMPLANT
DRAPE C-ARM 42X72 X-RAY (DRAPES) ×2 IMPLANT
DRAPE STERI IOBAN 125X83 (DRAPES) ×2 IMPLANT
DRAPE U-SHAPE 47X51 STRL (DRAPES) ×4 IMPLANT
DRSG AQUACEL AG ADV 3.5X10 (GAUZE/BANDAGES/DRESSINGS) ×2 IMPLANT
DURAPREP 26ML APPLICATOR (WOUND CARE) ×2 IMPLANT
ELECT BLADE 4.0 EZ CLEAN MEGAD (MISCELLANEOUS) ×2
ELECT REM PT RETURN 9FT ADLT (ELECTROSURGICAL) ×2
ELECTRODE BLDE 4.0 EZ CLN MEGD (MISCELLANEOUS) ×1 IMPLANT
ELECTRODE REM PT RTRN 9FT ADLT (ELECTROSURGICAL) ×1 IMPLANT
FACESHIELD WRAPAROUND (MASK) ×6 IMPLANT
FACESHIELD WRAPAROUND OR TEAM (MASK) ×2 IMPLANT
GLOVE BIO SURGEON STRL SZ7.5 (GLOVE) ×2 IMPLANT
GLOVE BIO SURGEON STRL SZ8.5 (GLOVE) ×2 IMPLANT
GLOVE BIOGEL PI IND STRL 8 (GLOVE) ×1 IMPLANT
GLOVE BIOGEL PI IND STRL 9 (GLOVE) ×1 IMPLANT
GLOVE BIOGEL PI INDICATOR 8 (GLOVE) ×1
GLOVE BIOGEL PI INDICATOR 9 (GLOVE) ×1
GOWN STRL REUS W/ TWL LRG LVL3 (GOWN DISPOSABLE) ×1 IMPLANT
GOWN STRL REUS W/ TWL XL LVL3 (GOWN DISPOSABLE) ×2 IMPLANT
GOWN STRL REUS W/TWL LRG LVL3 (GOWN DISPOSABLE) ×4
GOWN STRL REUS W/TWL XL LVL3 (GOWN DISPOSABLE) ×4
KIT BASIN OR (CUSTOM PROCEDURE TRAY) ×2 IMPLANT
KIT ROOM TURNOVER OR (KITS) ×2 IMPLANT
MANIFOLD NEPTUNE II (INSTRUMENTS) ×2 IMPLANT
NEEDLE HYPO 22GX1.5 SAFETY (NEEDLE) ×4 IMPLANT
NS IRRIG 1000ML POUR BTL (IV SOLUTION) ×2 IMPLANT
PACK TOTAL JOINT (CUSTOM PROCEDURE TRAY) ×2 IMPLANT
PAD ARMBOARD 7.5X6 YLW CONV (MISCELLANEOUS) ×3 IMPLANT
SUT ETHIBOND NAB CT1 #1 30IN (SUTURE) ×1 IMPLANT
SUT VIC AB 0 CT1 27 (SUTURE) ×2
SUT VIC AB 0 CT1 27XBRD ANBCTR (SUTURE) IMPLANT
SUT VIC AB 1 CTX 36 (SUTURE) ×2
SUT VIC AB 1 CTX36XBRD ANBCTR (SUTURE) ×1 IMPLANT
SUT VIC AB 2-0 CT1 27 (SUTURE) ×2
SUT VIC AB 2-0 CT1 TAPERPNT 27 (SUTURE) ×1 IMPLANT
SUT VIC AB 3-0 CT1 27 (SUTURE) ×2
SUT VIC AB 3-0 CT1 TAPERPNT 27 (SUTURE) ×1 IMPLANT
SYR CONTROL 10ML LL (SYRINGE) ×4 IMPLANT
TOWEL OR 17X26 10 PK STRL BLUE (TOWEL DISPOSABLE) ×2 IMPLANT
TRAY CATH 16FR W/PLASTIC CATH (SET/KITS/TRAYS/PACK) IMPLANT

## 2017-03-02 NOTE — Evaluation (Signed)
Physical Therapy Evaluation Patient Details Name: [name] [name] MRN: [id number] DOB: Oct 02, 1950 Today's Date: 03/02/2017   History of Present Illness  Pt is a 67 y/o female s/p R THA, direct anterior approach. PMH includes R breast cancer s/p mastectomy and reconstruction, DM, and chemotherapy induced cardiomyopathy.   Clinical Impression  Pt is s/p surgery above with deficits below. PTA, pt was independent with functional mobility. Upon eval, pt presenting with post op pain and weakness, and light headedness which limited mobility. Standing in room with RN from entry. Assisted in performing stand pivot to recliner requiring min guard A with RW. Further mobility deferred as pt complaining of lightheadedness. Reports husband will be able to assist at d/c and has all necessary DME. Pt reports she will be getting HHPT at d/c. Will continue to follow acutely to progress mobility according to pt tolerance and to increase independence and safety with mobility.     Follow Up Recommendations DC plan and follow up therapy as arranged by surgeon;Supervision for mobility/OOB    Equipment Recommendations  None recommended by PT    Recommendations for Other Services       Precautions / Restrictions Precautions Precautions: None Precaution Comments: Reviewed supine ther ex with pt.  Restrictions Weight Bearing Restrictions: Yes RLE Weight Bearing: Weight bearing as tolerated      Mobility  Bed Mobility               General bed mobility comments: Up with RN upon entry   Transfers Overall transfer level: Needs assistance Equipment used: Rolling walker (2 wheeled) Transfers: Stand Pivot Transfers   Stand pivot transfers: Min guard       General transfer comment: Standing with RN from [facility] upon entry. Performed stand pivot from [facility] with min guard assist. Verbal cues for sequencing using RW. Unable to attempt further ambulatioin as pt reports feeling lightheaded and woozy.    Ambulation/Gait         Gait velocity: NT      Stairs            Wheelchair Mobility    Modified Rankin (Stroke Patients Only)       Balance Overall balance assessment: Needs assistance Sitting-balance support: No upper extremity supported;Feet supported Sitting balance-Leahy Scale: Good     Standing balance support: Bilateral upper extremity supported;During functional activity Standing balance-Leahy Scale: Poor Standing balance comment: Reliant on BUE support.                              Pertinent Vitals/Pain Pain Assessment: 0-10 Pain Score: 2  Pain Location: R hip  Pain Descriptors / Indicators: Aching;Operative site guarding Pain Intervention(s): Limited activity within patient's tolerance;Monitored during session;Repositioned    Home Living Family/patient expects to be discharged to:: Private residence Living Arrangements: Spouse/significant other Available Help at Discharge: Family;Available 24 hours/day Type of Home: House Home Access: Level entry     Home Layout: One level Home Equipment: Walker - 2 wheels;Grab bars - toilet;Shower seat - built in;Cane - single point      Prior Function Level of Independence: Independent               Hand Dominance   Dominant Hand: Right    Extremity/Trunk Assessment   Upper Extremity Assessment Upper Extremity Assessment: Overall WFL for tasks assessed    Lower Extremity Assessment Lower Extremity Assessment: RLE deficits/detail RLE Deficits / Details: Reports some decreased sensation, however, able  to feel some touch on RLE. Deficits consistent with post op pain and weakness. Able  to perform ther ex below.     Cervical / Trunk Assessment Cervical / Trunk Assessment: Normal  Communication   Communication: No difficulties  Cognition Arousal/Alertness: Awake/alert Behavior During Therapy: WFL for tasks assessed/performed Overall Cognitive Status: Within Functional Limits for  tasks assessed                                        General Comments General comments (skin integrity, edema, etc.): Pt's husband in room. Asking about POC and discussed role of PT with pt and husband.     Exercises Total Joint Exercises Ankle Circles/Pumps: AROM;Both;20 reps Quad Sets: AROM;Right;10 reps Heel Slides: Other (comment)(verbally reviewed to add to ther ex this evening)   Assessment/Plan    PT Assessment Patient needs continued PT services  PT Problem List Decreased strength;Decreased activity tolerance;Decreased balance;Decreased range of motion;Decreased mobility;Decreased knowledge of use of DME;Impaired sensation;Pain       PT Treatment Interventions DME instruction;Gait training;Functional mobility training;Therapeutic activities;Therapeutic exercise;Balance training;Neuromuscular re-education;Patient/family education    PT Goals (Current goals can be found in the Care Plan section)  Acute Rehab PT Goals Patient Stated Goal: to go home  PT Goal Formulation: With patient Time For Goal Achievement: 03/16/17 Potential to Achieve Goals: Good    Frequency 7X/week   Barriers to discharge        Co-evaluation               AM-PAC PT "6 Clicks" Daily Activity  Outcome Measure Difficulty turning over in bed (including adjusting bedclothes, sheets and blankets)?: A Little Difficulty moving from lying on back to sitting on the side of the bed? : Unable Difficulty sitting down on and standing up from a chair with arms (e.g., wheelchair, bedside commode, etc,.)?: Unable Help needed moving to and from a bed to chair (including a wheelchair)?: A Little Help needed walking in hospital room?: A Little Help needed climbing 3-5 steps with a railing? : A Lot 6 Click Score: 13    End of Session Equipment Utilized During Treatment: Gait belt Activity Tolerance: Treatment limited secondary to medical complications (Comment)(light headedness  ) Patient left: in chair;with call bell/phone within reach;with family/visitor present Nurse Communication: Mobility status PT Visit Diagnosis: Other abnormalities of gait and mobility (R26.89);Pain Pain - Right/Left: Right Pain - part of body: Hip    Time: 1610-9604 PT Time Calculation (min) (ACUTE ONLY): 14 min   Charges:   PT Evaluation $PT Eval Low Complexity: 1 Low     PT G Codes:        Leighton Ruff, PT, DPT  Acute Rehabilitation Services  Pager: [phone]   Rudean Hitt 03/02/2017, 6:18 PM

## 2017-03-02 NOTE — Discharge Instructions (Addendum)
INSTRUCTIONS AFTER JOINT REPLACEMENT  ° °o Remove items at home which could result in a fall. This includes throw rugs or furniture in walking pathways °o ICE to the affected joint every three hours while awake for 30 minutes at a time, for at least the first 3-5 days, and then as needed for pain and swelling.  Continue to use ice for pain and swelling. You may notice swelling that will progress down to the foot and ankle.  This is normal after surgery.  Elevate your leg when you are not up walking on it.   °o Continue to use the breathing machine you got in the hospital (incentive spirometer) which will help keep your temperature down.  It is common for your temperature to cycle up and down following surgery, especially at night when you are not up moving around and exerting yourself.  The breathing machine keeps your lungs expanded and your temperature down. ° ° °DIET:  As you were doing prior to hospitalization, we recommend a well-balanced diet. ° °DRESSING / WOUND CARE / SHOWERING ° °Keep the surgical dressing until follow up.  The dressing is water proof, so you can shower without any extra covering.  IF THE DRESSING FALLS OFF or the wound gets wet inside, change the dressing with sterile gauze.  Please use good hand washing techniques before changing the dressing.  Do not use any lotions or creams on the incision until instructed by your surgeon.   ° °ACTIVITY ° °o Increase activity slowly as tolerated, but follow the weight bearing instructions below.   °o No driving for 6 weeks or until further direction given by your physician.  You cannot drive while taking narcotics.  °o No lifting or carrying greater than 10 lbs. until further directed by your surgeon. °o Avoid periods of inactivity such as sitting longer than an hour when not asleep. This helps prevent blood clots.  °o You may return to work once you are authorized by your doctor.  ° ° ° °WEIGHT BEARING  ° °Weight bearing as tolerated with assist  device (walker, cane, etc) as directed, use it as long as suggested by your surgeon or therapist, typically at least 4-6 weeks. ° ° °EXERCISES ° °Results after joint replacement surgery are often greatly improved when you follow the exercise, range of motion and muscle strengthening exercises prescribed by your doctor. Safety measures are also important to protect the joint from further injury. Any time any of these exercises cause you to have increased pain or swelling, decrease what you are doing until you are comfortable again and then slowly increase them. If you have problems or questions, call your caregiver or physical therapist for advice.  ° °Rehabilitation is important following a joint replacement. After just a few days of immobilization, the muscles of the leg can become weakened and shrink (atrophy).  These exercises are designed to build up the tone and strength of the thigh and leg muscles and to improve motion. Often times heat used for twenty to thirty minutes before working out will loosen up your tissues and help with improving the range of motion but do not use heat for the first two weeks following surgery (sometimes heat can increase post-operative swelling).  ° °These exercises can be done on a training (exercise) mat, on the floor, on a table or on a bed. Use whatever works the best and is most comfortable for you.    Use music or television while you are exercising so that   the exercises are a pleasant break in your day. This will make your life better with the exercises acting as a break in your routine that you can look forward to.   Perform all exercises about fifteen times, three times per day or as directed.  You should exercise both the operative leg and the other leg as well. ° °Exercises include: °  °• Quad Sets - Tighten up the muscle on the front of the thigh (Quad) and hold for 5-10 seconds.   °• Straight Leg Raises - With your knee straight (if you were given a brace, keep it on),  lift the leg to 60 degrees, hold for 3 seconds, and slowly lower the leg.  Perform this exercise against resistance later as your leg gets stronger.  °• Leg Slides: Lying on your back, slowly slide your foot toward your buttocks, bending your knee up off the floor (only go as far as is comfortable). Then slowly slide your foot back down until your leg is flat on the floor again.  °• Angel Wings: Lying on your back spread your legs to the side as far apart as you can without causing discomfort.  °• Hamstring Strength:  Lying on your back, push your heel against the floor with your leg straight by tightening up the muscles of your buttocks.  Repeat, but this time bend your knee to a comfortable angle, and push your heel against the floor.  You may put a pillow under the heel to make it more comfortable if necessary.  ° °A rehabilitation program following joint replacement surgery can speed recovery and prevent re-injury in the future due to weakened muscles. Contact your doctor or a physical therapist for more information on knee rehabilitation.  ° ° °CONSTIPATION ° °Constipation is defined medically as fewer than three stools per week and severe constipation as less than one stool per week.  Even if you have a regular bowel pattern at home, your normal regimen is likely to be disrupted due to multiple reasons following surgery.  Combination of anesthesia, postoperative narcotics, change in appetite and fluid intake all can affect your bowels.  ° °YOU MUST use at least one of the following options; they are listed in order of increasing strength to get the job done.  They are all available over the counter, and you may need to use some, POSSIBLY even all of these options:   ° °Drink plenty of fluids (prune juice may be helpful) and high fiber foods °Colace 100 mg by mouth twice a day  °Senokot for constipation as directed and as needed Dulcolax (bisacodyl), take with full glass of water  °Miralax (polyethylene glycol)  once or twice a day as needed. ° °If you have tried all these things and are unable to have a bowel movement in the first 3-4 days after surgery call either your surgeon or your primary doctor.   ° °If you experience loose stools or diarrhea, hold the medications until you stool forms back up.  If your symptoms do not get better within 1 week or if they get worse, check with your doctor.  If you experience "the worst abdominal pain ever" or develop nausea or vomiting, please contact the office immediately for further recommendations for treatment. ° ° °ITCHING:  If you experience itching with your medications, try taking only a single pain pill, or even half a pain pill at a time.  You can also use Benadryl over the counter for itching or also to   help with sleep.  ° °TED HOSE STOCKINGS:  Use stockings on both legs until for at least 2 weeks or as directed by physician office. They may be removed at night for sleeping. ° °MEDICATIONS:  See your medication summary on the “After Visit Summary” that nursing will review with you.  You may have some home medications which will be placed on hold until you complete the course of blood thinner medication.  It is important for you to complete the blood thinner medication as prescribed. ° °PRECAUTIONS:  If you experience chest pain or shortness of breath - call 911 immediately for transfer to the hospital emergency department.  ° °If you develop a fever greater that 101 F, purulent drainage from wound, increased redness or drainage from wound, foul odor from the wound/dressing, or calf pain - CONTACT YOUR SURGEON.   °                                                °FOLLOW-UP APPOINTMENTS:  If you do not already have a post-op appointment, please call the office for an appointment to be seen by your surgeon.  Guidelines for how soon to be seen are listed in your “After Visit Summary”, but are typically between 1-4 weeks after surgery. ° °OTHER INSTRUCTIONS:  ° °Knee  Replacement:  Do not place pillow under knee, focus on keeping the knee straight while resting. CPM instructions: 0-90 degrees, 2 hours in the morning, 2 hours in the afternoon, and 2 hours in the evening. Place foam block, curve side up under heel at all times except when in CPM or when walking.  DO NOT modify, tear, cut, or change the foam block in any way. ° °MAKE SURE YOU:  °• Understand these instructions.  °• Get help right away if you are not doing well or get worse.  ° ° °Thank you for letting us be a part of your medical care team.  It is a privilege we respect greatly.  We hope these instructions will help you stay on track for a fast and full recovery!  ° ° ° ° °Information on my medicine - ELIQUIS® (apixaban) ° °This medication education was reviewed with me or my healthcare representative as part of my discharge preparation.  The pharmacist that spoke with me during my hospital stay was:  Apolinar Bero Dien, RPH ° °Why was Eliquis® prescribed for you? °Eliquis® was prescribed for you to reduce the risk of blood clots forming after orthopedic surgery.   ° °What do You need to know about Eliquis®? °Take your Eliquis® TWICE DAILY - one tablet in the morning and one tablet in the evening with or without food.  It would be best to take the dose about the same time each day. ° °If you have difficulty swallowing the tablet whole please discuss with your pharmacist how to take the medication safely. ° °Take Eliquis® exactly as prescribed by your doctor and DO NOT stop taking Eliquis® without talking to the doctor who prescribed the medication.  Stopping without other medication to take the place of Eliquis® may increase your risk of developing a clot. ° °After discharge, you should have regular check-up appointments with your healthcare provider that is prescribing your Eliquis®. ° °What do you do if you miss a dose? °If a dose of ELIQUIS® is not taken at the   scheduled time, take it as soon as possible on the same  day and twice-daily administration should be resumed.  The dose should not be doubled to make up for a missed dose.  Do not take more than one tablet of ELIQUIS at the same time. ° °Important Safety Information °A possible side effect of Eliquis® is bleeding. You should call your healthcare provider right away if you experience any of the following: °? Bleeding from an injury or your nose that does not stop. °? Unusual colored urine (red or dark brown) or unusual colored stools (red or black). °? Unusual bruising for unknown reasons. °? A serious fall or if you hit your head (even if there is no bleeding). ° °Some medicines may interact with Eliquis® and might increase your risk of bleeding or clotting while on Eliquis®. To help avoid this, consult your healthcare provider or pharmacist prior to using any new prescription or non-prescription medications, including herbals, vitamins, non-steroidal anti-inflammatory drugs (NSAIDs) and supplements. ° °This website has more information on Eliquis® (apixaban): http://www.eliquis.com/eliquis/home ° °

## 2017-03-02 NOTE — Transfer of Care (Signed)
Immediate Anesthesia Transfer of Care Note  Patient: [name] [name]  Procedure(s) Performed: RIGHT TOTAL HIP ARTHROPLASTY ANTERIOR APPROACH (Right Hip)  Patient Location: PACU  Anesthesia Type:General  Level of Consciousness: awake, alert , oriented and patient cooperative  Airway & Oxygen Therapy: Patient Spontanous Breathing  Post-op Assessment: Report given to RN and Post -op Vital signs reviewed and stable  Post vital signs: Reviewed and stable  Last Vitals:  Vitals:   03/02/17 1240 03/02/17 1245  BP: (!) 150/62   Pulse: (!) 107 (!) 104  Resp: (!) 24 15  Temp: (!) 36.1 C   SpO2: 100% 100%    Last Pain:  Vitals:   03/02/17 0850  TempSrc: Oral      Patients Stated Pain Goal: 2 (16/10/96 0454)  Complications: No apparent anesthesia complications

## 2017-03-02 NOTE — Anesthesia Preprocedure Evaluation (Addendum)
Anesthesia Evaluation  Patient identified by MRN, date of birth, ID band Patient awake    Reviewed: Allergy & Precautions, H&P , NPO status , Patient's Chart, lab work & pertinent test results, reviewed documented beta blocker date and time   Airway Mallampati: II  TM Distance: >3 FB Neck ROM: Full    Dental no notable dental hx. (+) Teeth Intact, Dental Advisory Given   Pulmonary neg pulmonary ROS, former smoker,    Pulmonary exam normal breath sounds clear to auscultation       Cardiovascular +CHF (due to medication)   Rhythm:Regular Rate:Normal  ECG: NSR, LAE, rate 94   Neuro/Psych negative neurological ROS  negative psych ROS   GI/Hepatic Neg liver ROS, hiatal hernia, GERD  Medicated and Controlled,  Endo/Other  diabetes, Type 2, Oral Hypoglycemic Agents  Renal/GU negative Renal ROS     Musculoskeletal  (+) Arthritis , Osteoarthritis,    Abdominal (+) + obese,   Peds  Hematology HLD   Anesthesia Other Findings   Reproductive/Obstetrics                            Anesthesia Physical  Anesthesia Plan  ASA: II  Anesthesia Plan: Spinal   Post-op Pain Management:    Induction: Intravenous  PONV Risk Score and Plan: 2 and Dexamethasone, Ondansetron and Midazolam  Airway Management Planned: Natural Airway  Additional Equipment:   Intra-op Plan:   Post-operative Plan:   Informed Consent: I have reviewed the patients History and Physical, chart, labs and discussed the procedure including the risks, benefits and alternatives for the proposed anesthesia with the patient or authorized representative who has indicated his/her understanding and acceptance.   Dental advisory given  Plan Discussed with: CRNA  Anesthesia Plan Comments:        Anesthesia Quick Evaluation

## 2017-03-02 NOTE — Progress Notes (Signed)
Patient is well known to me from a GI standpoint. She has a history of PUD and mild gastroparesis. I have advised her not to take any NSAIDS at any time as she has had duodenal bulb ulcers in the past. Please call me if there are any questions at [phone].    LOS: 0 days   Maleki Hippe 03/02/2017, 3:41 PM

## 2017-03-02 NOTE — Interval H&P Note (Signed)
History and Physical Interval Note:  03/02/2017 8:52 AM  [name] [name]  has presented today for surgery, with the diagnosis of RIGHT HIP OSTEOARTHRITIS  The various methods of treatment have been discussed with the patient and family. After consideration of risks, benefits and other options for treatment, the patient has consented to  Procedure(s): RIGHT TOTAL HIP ARTHROPLASTY ANTERIOR APPROACH (Right) as a surgical intervention .  The patient's history has been reviewed, patient examined, no change in status, stable for surgery.  I have reviewed the patient's chart and labs.  Questions were answered to the patient's satisfaction.     Kerin Salen

## 2017-03-02 NOTE — Op Note (Signed)
OPERATIVE REPORT    DATE OF PROCEDURE:  03/02/2017       PREOPERATIVE DIAGNOSIS:  RIGHT HIP OSTEOARTHRITIS                                                          POSTOPERATIVE DIAGNOSIS:  RIGHT HIP OSTEOARTHRITIS                                                           PROCEDURE: Anterior R total hip arthroplasty using a 48 mm DePuy Pinnacle  Cup, [employer], 0-degree polyethylene liner, a +1.5 32 mm ceramic head, a 3 hi Depuy Triloc stem   SURGEON: Kerin Salen    ASSISTANT:   Kerry Hough. [employer]  (present throughout entire procedure and necessary for timely completion of the procedure)   ANESTHESIA: Spinal BLOOD LOSS: 300 FLUID REPLACEMENT: 1500 crystalloid Antibiotic: 2gm ancef Tranexamic Acid: 1gm IV, 2gm Topical COMPLICATIONS: none    INDICATIONS FOR PROCEDURE: A 67 y.o. year-old With  RIGHT HIP OSTEOARTHRITIS   for 3 years, x-rays show bone-on-bone arthritic changes, and osteophytes. Despite conservative measures with observation, anti-inflammatory medicine, narcotics, use of a cane, has severe unremitting pain and can ambulate only a few blocks before resting. Patient desires elective R total hip arthroplasty to decrease pain and increase function. The risks, benefits, and alternatives were discussed at length including but not limited to the risks of infection, bleeding, nerve injury, stiffness, blood clots, the need for revision surgery, cardiopulmonary complications, among others, and they were willing to proceed. Questions answered     PROCEDURE IN DETAIL: The patient was identified by armband,  received preoperative IV antibiotics in the holding area at [facility], taken to the operating room , appropriate anesthetic monitors  were attached and  anesthesia was induced with the patienton the gurney. The HANA boots were applied to the feet and he was then transferred to the HANA table with a peroneal post and support underneath the non-operative  le, which was locked in 5 lb traction. Theoperative lower extremity was then prepped and draped in the usual sterile fashion from just above the iliac crest to the knee. And a timeout procedure was performed. We then made a 12 cm incision along the interval at the leading edge of the tensor fascia lata of starting at 2 cm lateral to and 2 cm distal to the ASIS. Small bleeders in the skin and subcutaneous tissue identified and cauterized we dissected down to the fascia and made an incision in the fascia allowing [place] to elevate the fascia of the tensor muscle and exploited the interval between the rectus and the tensor fascia lata. A Hohmann retractor was then placed along the superior neck of the femur and a Cobra retractor along the inferior neck of the femur we teed the capsule starting out at the superior anterior aspect of the acetabulum going distally and made the T along the neck both leaflets of the T were tagged with #2 Ethibond suture. Cobra retractors were then placed along the inferior and superior neck allowing [place] to perform a standard neck cut  and removed the femoral head with a power corkscrew. We then placed a right angle Hohmann retractor along the anterior aspect of the acetabulum a spiked Cobra in the cotyloid notch and posteriorly a Muelller retractor. We then sequentially reamed up to a 47 mm basket reamer obtaining good coverage in all quadrants, verified by C-arm imaging. Under C-arm control with and hammered into place a 48 mm Pinnacle cup in 45 of abduction and 15 of anteversion. The cup seated nicely and required no supplemental screws. We then placed a central hole Eliminator and a 0 polyethylene liner. The foot was then externally rotated to 110, the HANA elevator was placed around the flare of the greater trochanter and the limb was extended and abducted delivering the proximal femur up into the wound. A medium Hohmann retractor was placed over the greater trochanter and a Mueller  retractor along the posterior femoral neck completing the exposure. We then performed releases superiorly and and inferiorly of the capsule going back to the pirformis fossa superiorly and to the lesser trochanter inferiorly. We then entered the proximal femur with the box cutting offset chisel followed by, a canal sounder, the chili pepper and broaching up to a 3 broach. This seated nicely and we reamed the calcar. A trial reduction was performed with a 1.5 mm 32 mm head.The limb lengths were excellent the hip was stable in 90 of external rotation. At this point the trial components removed and we hammered into place a # 3 hi  Offset Tri-Lock stem with Gryption coating. A + 1.5 32 mm ceramic ball was then hammered into place the hip was reduced and final C-arm images obtained. The wound was thoroughly irrigated with normal saline solution. We repaired the ant capsule and the tensor fascia lot a with running 0 vicryl suture. the subcutaneous tissue was closed with 2-0 and 3-0 Vicryl suture followed by an Aquacil dressing. At this point the patient was awaken and transferred to hospital gurney without difficulty. The subcutaneous tissue with 0 and 2-0 undyed Vicryl suture and the skin with running  3-0 vicryl subcuticular suture. Aquacil dressing was applied. The patient was then unclamped, rolled supine, awaken extubated and taken to recovery room without difficulty in stable condition.   Kerin Salen 03/02/2017, 12:01 PM

## 2017-03-02 NOTE — Anesthesia Procedure Notes (Signed)
Procedure Name: Intubation Date/Time: 03/02/2017 11:00 AM Performed by: Adalberto Ill, CRNA Pre-anesthesia Checklist: Patient identified, Emergency Drugs available, Suction available, Patient being monitored and Timeout performed Patient Re-evaluated:Patient Re-evaluated prior to induction Oxygen Delivery Method: Circle system utilized Preoxygenation: Pre-oxygenation with 100% oxygen Induction Type: IV induction Ventilation: Mask ventilation without difficulty Laryngoscope Size: Miller and 2 Grade View: Grade I Tube type: Oral Tube size: 7.0 mm Number of attempts: 1 Airway Equipment and Method: Stylet Placement Confirmation: ETT inserted through vocal cords under direct vision,  positive ETCO2 and breath sounds checked- equal and bilateral Secured at: 23 cm Tube secured with: Tape Dental Injury: Teeth and Oropharynx as per pre-operative assessment

## 2017-03-03 ENCOUNTER — Other Ambulatory Visit: Payer: Self-pay

## 2017-03-03 ENCOUNTER — Encounter (HOSPITAL_COMMUNITY): Payer: Self-pay | Admitting: General Practice

## 2017-03-03 LAB — CBC
HCT: 32.2 % — ABNORMAL LOW (ref 36.0–46.0)
Hemoglobin: 10.3 g/dL — ABNORMAL LOW (ref 12.0–15.0)
MCH: 27.8 pg (ref 26.0–34.0)
MCHC: 32 g/dL (ref 30.0–36.0)
MCV: 86.8 fL (ref 78.0–100.0)
Platelets: 238 10*3/uL (ref 150–400)
RBC: 3.71 MIL/uL — ABNORMAL LOW (ref 3.87–5.11)
RDW: 13.7 % (ref 11.5–15.5)
WBC: 8.8 10*3/uL (ref 4.0–10.5)

## 2017-03-03 LAB — GLUCOSE, CAPILLARY
Glucose-Capillary: 177 mg/dL — ABNORMAL HIGH (ref 65–99)
Glucose-Capillary: 285 mg/dL — ABNORMAL HIGH (ref 65–99)
Glucose-Capillary: 294 mg/dL — ABNORMAL HIGH (ref 65–99)
Glucose-Capillary: 177 mg/dL — ABNORMAL HIGH (ref 65–99)

## 2017-03-03 NOTE — Anesthesia Postprocedure Evaluation (Signed)
Anesthesia Post Note  Patient: [name] G Salais  Procedure(s) Performed: RIGHT TOTAL HIP ARTHROPLASTY ANTERIOR APPROACH (Right Hip)     Patient location during evaluation: PACU Anesthesia Type: General Level of consciousness: awake and alert Pain management: pain level controlled Vital Signs Assessment: post-procedure vital signs reviewed and stable Respiratory status: spontaneous breathing, nonlabored ventilation, respiratory function stable and patient connected to nasal cannula oxygen Cardiovascular status: blood pressure returned to baseline and stable Postop Assessment: no apparent nausea or vomiting Anesthetic complications: no    Last Vitals:  Vitals:   03/03/17 0007 03/03/17 0431  BP: 140/62 (!) 113/59  Pulse: 92 79  Resp: 16 13  Temp: 37 C 36.7 C  SpO2: 99% 93%    Last Pain:  Vitals:   03/03/17 1212  TempSrc:   PainSc: 6                  Ryan P Ellender

## 2017-03-03 NOTE — Progress Notes (Signed)
Physical Therapy Treatment Patient Details Name: [name] [name] MRN: [id number] DOB: 07/16/1950 Today's Date: 03/03/2017    History of Present Illness Pt is a 67 y/o female s/p R THA, direct anterior approach. PMH includes R breast cancer s/p mastectomy and reconstruction, DM, and chemotherapy induced cardiomyopathy.     PT Comments    Patient received in bed, pleasant and willing to participate with skilled PT services. Began with review of supine/seated/standing therapeutic exercises for total hip as well as review of general precautions/WB status. Patient able to complete all functional bed mobility, transfers, and gait with min guard, VC for safety and sequencing today. Note antalgic gait pattern with reduce weight bearing on R LE, improves with VC however still requires skilled cues to correct mechanics. She was left up in chair with husband and CNA present and attending, all needs otherwise met.    Follow Up Recommendations  DC plan and follow up therapy as arranged by surgeon;Supervision for mobility/OOB     Equipment Recommendations  None recommended by PT    Recommendations for Other Services       Precautions / Restrictions Precautions Precautions: None Precaution Comments: reviewed seated/supine/standing ther ex with patient  Restrictions Weight Bearing Restrictions: Yes RLE Weight Bearing: Weight bearing as tolerated    Mobility  Bed Mobility Overal bed mobility: Needs Assistance Bed Mobility: Supine to Sit     Supine to sit: Min guard     General bed mobility comments: increased time and effort   Transfers Overall transfer level: Needs assistance Equipment used: Rolling walker (2 wheeled) Transfers: Sit to/from Stand Sit to Stand: Min guard         General transfer comment: VC for sequencing and safety   Ambulation/Gait Ambulation/Gait assistance: Min guard Ambulation Distance (Feet): 15 Feet Assistive device: Rolling walker (2 wheeled) Gait  Pattern/deviations: Step-through pattern;Decreased step length - left;Decreased stance time - right;Decreased stride length;Decreased dorsiflexion - right;Decreased weight shift to right;Antalgic;Trunk flexed         Stairs            Wheelchair Mobility    Modified Rankin (Stroke Patients Only)       Balance Overall balance assessment: Needs assistance Sitting-balance support: No upper extremity supported;Feet supported Sitting balance-Leahy Scale: Good     Standing balance support: Bilateral upper extremity supported;During functional activity Standing balance-Leahy Scale: Fair Standing balance comment: reliant on B UE support                             Cognition Arousal/Alertness: Awake/alert Behavior During Therapy: WFL for tasks assessed/performed Overall Cognitive Status: Within Functional Limits for tasks assessed                                        Exercises Total Joint Exercises Ankle Circles/Pumps: Both;10 reps;Supine Quad Sets: Right;10 reps;Supine Short Arc Quad: Right;10 reps;Supine Heel Slides: Right;10 reps;Supine Hip ABduction/ADduction: Right;10 reps;Supine;Standing Long Arc Quad: Right;5 reps;Seated Knee Flexion: Right;5 reps;Standing Marching in Standing: Right;5 reps;Standing    General Comments        Pertinent Vitals/Pain Pain Assessment: 0-10 Pain Score: 6  Pain Location: R hip  Pain Descriptors / Indicators: Aching;Operative site guarding Pain Intervention(s): Limited activity within patient's tolerance;Monitored during session;Patient requesting pain meds-RN notified    Home Living                        Prior Function            PT Goals (current goals can now be found in the care plan section) Acute Rehab PT Goals Patient Stated Goal: to go home  PT Goal Formulation: With patient Time For Goal Achievement: 03/16/17 Potential to Achieve Goals: Good Progress towards PT goals:  Progressing toward goals    Frequency    7X/week      PT Plan Current plan remains appropriate    Co-evaluation              AM-PAC PT "6 Clicks" Daily Activity  Outcome Measure  Difficulty turning over in bed (including adjusting bedclothes, sheets and blankets)?: Unable Difficulty moving from lying on back to sitting on the side of the bed? : Unable Difficulty sitting down on and standing up from a chair with arms (e.g., wheelchair, bedside commode, etc,.)?: Unable Help needed moving to and from a bed to chair (including a wheelchair)?: A Little Help needed walking in hospital room?: A Little Help needed climbing 3-5 steps with a railing? : A Little 6 Click Score: 12    End of Session Equipment Utilized During Treatment: Gait belt Activity Tolerance: Patient tolerated treatment well;Patient limited by pain Patient left: in chair;with call bell/phone within reach;with family/visitor present;with nursing/sitter in room Nurse Communication: Mobility status PT Visit Diagnosis: Other abnormalities of gait and mobility (R26.89);Pain Pain - Right/Left: Right Pain - part of body: Hip     Time: 7741-2878 PT Time Calculation (min) (ACUTE ONLY): 31 min  Charges:  $Gait Training: 8-22 mins $Therapeutic Exercise: 8-22 mins                    G Codes:  Functional Assessment Tool Used: AM-PAC 6 Clicks Basic Mobility;Clinical judgement    Deniece Ree PT, DPT, CBIS  Supplemental Physical Therapist [place]   Pager [phone]

## 2017-03-03 NOTE — Care Management Note (Signed)
Case Management Note  Patient Details  Name: [name] [name] MRN: [id number] Date of Birth: 11/17/1950  Subjective/Objective:   67 yr old female s/p right total hip arthroplasty.                 Action/Plan: Case manager spoke with patient and her husband concerning discharge plan and DME. Patient was preoperatively setup with [facility], no changes, she has RW and 3in1 already. Patient will have family support at discharge.   Expected Discharge Date:    03/04/17              Expected Discharge Plan:  [place]  In-House Referral:     Discharge planning Services  CM Consult  Post Acute Care Choice:  Home Health Choice offered to:  Patient  DME Arranged:  N/A(has RW and 3in1) DME Agency:  NA  HH Arranged:  PT [place] Agency:  [facility] (formerly [employer])  Status of Service:  Completed, signed off  If discussed at [employer], dates discussed:    Additional Comments:  Ninfa Meeker, RN 03/03/2017, 3:00 PM

## 2017-03-03 NOTE — Progress Notes (Signed)
Physical Therapy Treatment Patient Details Name: [name] [name] MRN: [id number] DOB: 1950-03-03 Today's Date: 03/03/2017    History of Present Illness Pt is a 67 y/o female s/p R THA, direct anterior approach. PMH includes R breast cancer s/p mastectomy and reconstruction, DM, and chemotherapy induced cardiomyopathy.     PT Comments    Patient received in bed this afternoon, pleasant and willing to participate in skilled PT services this afternoon. She is S-Min guard for functional bed mobility and transfers with occasional VC for safety, able to ambulate approximately 116f this afternoon with RW and general S today, no significant unsteadiness or fatigue noted. She was left up in chair with her husband present, all needs met and questions/concerns otherwise addressed.     Follow Up Recommendations  DC plan and follow up therapy as arranged by surgeon;Supervision for mobility/OOB     Equipment Recommendations  None recommended by PT    Recommendations for Other Services       Precautions / Restrictions Precautions Precautions: None Restrictions Weight Bearing Restrictions: Yes RLE Weight Bearing: Weight bearing as tolerated    Mobility  Bed Mobility Overal bed mobility: Needs Assistance Bed Mobility: Supine to Sit     Supine to sit: Supervision     General bed mobility comments: increased time and effort   Transfers Overall transfer level: Needs assistance Equipment used: Rolling walker (2 wheeled) Transfers: Sit to/from Stand Sit to Stand: Min guard         General transfer comment: VC for sequencing and safety   Ambulation/Gait Ambulation/Gait assistance: Supervision Ambulation Distance (Feet): 180 Feet Assistive device: Rolling walker (2 wheeled) Gait Pattern/deviations: Step-through pattern;Decreased step length - left;Decreased stance time - right;Decreased stride length;Decreased dorsiflexion - right;Decreased weight shift to right;Antalgic;Trunk  flexed     General Gait Details: self selected pace, no significant fatigue or unsteadiness noted    Stairs            Wheelchair Mobility    Modified Rankin (Stroke Patients Only)       Balance Overall balance assessment: Needs assistance Sitting-balance support: No upper extremity supported;Feet supported Sitting balance-Leahy Scale: Good     Standing balance support: Bilateral upper extremity supported;During functional activity Standing balance-Leahy Scale: Fair Standing balance comment: reliant on B UE support                             Cognition Arousal/Alertness: Awake/alert Behavior During Therapy: WFL for tasks assessed/performed Overall Cognitive Status: Within Functional Limits for tasks assessed                                        Exercises      General Comments        Pertinent Vitals/Pain Pain Assessment: 0-10 Pain Score: 4  Pain Location: R hip  Pain Descriptors / Indicators: Aching;Operative site guarding Pain Intervention(s): Limited activity within patient's tolerance;Monitored during session    Home Living Family/patient expects to be discharged to:: Private residence Living Arrangements: Spouse/significant other                  Prior Function            PT Goals (current goals can now be found in the care plan section) Acute Rehab PT Goals Patient Stated Goal: to go home  PT Goal Formulation: With patient  Time For Goal Achievement: 03/16/17 Potential to Achieve Goals: Good Progress towards PT goals: Progressing toward goals    Frequency    7X/week      PT Plan Current plan remains appropriate    Co-evaluation              AM-PAC PT "6 Clicks" Daily Activity  Outcome Measure  Difficulty turning over in bed (including adjusting bedclothes, sheets and blankets)?: None Difficulty moving from lying on back to sitting on the side of the bed? : None Difficulty sitting down on  and standing up from a chair with arms (e.g., wheelchair, bedside commode, etc,.)?: None Help needed moving to and from a bed to chair (including a wheelchair)?: A Little Help needed walking in hospital room?: A Little Help needed climbing 3-5 steps with a railing? : A Little 6 Click Score: 21    End of Session   Activity Tolerance: Patient tolerated treatment well Patient left: in chair;with call bell/phone within reach;with family/visitor present   PT Visit Diagnosis: Other abnormalities of gait and mobility (R26.89);Pain Pain - Right/Left: Right Pain - part of body: Hip     Time: 1450-1504 PT Time Calculation (min) (ACUTE ONLY): 14 min  Charges:  $Gait Training: 8-22 mins                    G Codes:  Functional Assessment Tool Used: AM-PAC 6 Clicks Basic Mobility;Clinical judgement    Deniece Ree PT, DPT, CBIS  Supplemental Physical Therapist [place]   Pager [phone]

## 2017-03-03 NOTE — Progress Notes (Signed)
PATIENT ID: [name] [name]  MRN: [id number]  DOB/AGE:  1950-08-10 / 67 y.o.  1 Day Post-Op Procedure(s) (LRB): RIGHT TOTAL HIP ARTHROPLASTY ANTERIOR APPROACH (Right)    PROGRESS NOTE Subjective: Patient is alert, oriented, no Nausea, no Vomiting, yes passing gas, . Taking PO well. Denies SOB, Chest or Calf Pain. Using Incentive Spirometer, PAS in place. Ambulate Walk in room last night without difficulty with physical therapy Patient reports pain as  2/10  .    Objective: Vital signs in last 24 hours: Vitals:   03/02/17 1500 03/02/17 1937 03/03/17 0007 03/03/17 0431  BP: (!) 162/61 (!) 148/69 140/62 (!) 113/59  Pulse: 100 83 92 79  Resp: 18 16 16 13   Temp: 98.8 F (37.1 C) 98.9 F (37.2 C) 98.6 F (37 C) 98.1 F (36.7 C)  TempSrc: Oral Oral Oral Oral  SpO2: 100% 100% 99% 93%  Weight:      Height:          Intake/Output from previous day: I/O last 3 completed shifts: In: 1440 [P.O.:240; I.V.:1200] Out: 250 [Emesis/NG output:50; Blood:200]   Intake/Output this shift: No intake/output data recorded.   LABORATORY DATA: Recent Labs    03/02/17 1622 03/02/17 2103 03/03/17 0639  GLUCAP 130* 225* 177*    Examination: Neurologically intact ABD soft Neurovascular intact Sensation intact distally Intact pulses distally Dorsiflexion/Plantar flexion intact Incision: dressing C/D/I No cellulitis present Compartment soft} XR AP&Lat of hip shows well placed\fixed THA  Assessment:   1 Day Post-Op Procedure(s) (LRB): RIGHT TOTAL HIP ARTHROPLASTY ANTERIOR APPROACH (Right) ADDITIONAL DIAGNOSIS:  Expected Acute Blood Loss Anemia, Diabetes, peptic ulcer disease, history of breast cancer, history of chemotherapy-induced cardiomyopathy.  Plan: PT/OT WBAT, THA  DVT Prophylaxis: SCDx72 hrs, Eliquis 2.5 mg by mouth twice a day 2 weeks, patient cannot take any NSAIDs.  DISCHARGE PLAN: Home  DISCHARGE NEEDS: HHPT, Walker and 3-in-1 comode seat

## 2017-03-04 LAB — CBC
HCT: 30.1 % — ABNORMAL LOW (ref 36.0–46.0)
Hemoglobin: 9.9 g/dL — ABNORMAL LOW (ref 12.0–15.0)
MCH: 28.5 pg (ref 26.0–34.0)
MCHC: 32.9 g/dL (ref 30.0–36.0)
MCV: 86.7 fL (ref 78.0–100.0)
Platelets: 238 10*3/uL (ref 150–400)
RBC: 3.47 MIL/uL — ABNORMAL LOW (ref 3.87–5.11)
RDW: 13.6 % (ref 11.5–15.5)
WBC: 10.2 10*3/uL (ref 4.0–10.5)

## 2017-03-04 LAB — GLUCOSE, CAPILLARY: Glucose-Capillary: 210 mg/dL — ABNORMAL HIGH (ref 65–99)

## 2017-03-04 NOTE — Progress Notes (Signed)
Reviewed discharge papers and medications with full understanding, IV removed and intact,

## 2017-03-04 NOTE — Progress Notes (Addendum)
PATIENT ID: [name] [name]  MRN: [id number]  DOB/AGE:  67-Apr-1952 / 67 y.o.  2 Days Post-Op Procedure(s) (LRB): RIGHT TOTAL HIP ARTHROPLASTY ANTERIOR APPROACH (Right)    PROGRESS NOTE Subjective: Patient is alert, oriented, no current Nausea, no Vomiting, yes passing gas, . Taking PO well. Denies SOB, Chest or Calf Pain. Using Incentive Spirometer, PAS in place. Ambulate WBAT with pt walking 180 ft with therapy Patient reports pain as  Minimal at rest .    Objective: Vital signs in last 24 hours: Vitals:   03/03/17 0431 03/03/17 1300 03/03/17 1939 03/04/17 0500  BP: (!) 113/59 (!) 124/58 (!) 133/55 (!) 104/39  Pulse: 79 90 98 (!) 110  Resp: 13 16 16 15   Temp: 98.1 F (36.7 C) (!) 97.3 F (36.3 C) 98.1 F (36.7 C) 99 F (37.2 C)  TempSrc: Oral Oral Oral Oral  SpO2: 93% 97% 99% 99%  Weight:      Height:          Intake/Output from previous day: I/O last 3 completed shifts: In: 600 [P.O.:600] Out: -    Intake/Output this shift: No intake/output data recorded.   LABORATORY DATA: Recent Labs    03/03/17 0709  03/03/17 1657 03/03/17 2110 03/04/17 0434 03/04/17 0631  WBC 8.8  --   --   --  10.2  --   HGB 10.3*  --   --   --  9.9*  --   HCT 32.2*  --   --   --  30.1*  --   PLT 238  --   --   --  238  --   GLUCAP  --    < > 294* 177*  --  210*   < > = values in this interval not displayed.    Examination: Neurologically intact Neurovascular intact Sensation intact distally Intact pulses distally Dorsiflexion/Plantar flexion intact Incision: dressing C/D/I and no drainage No cellulitis present Compartment soft} XR AP&Lat of hip shows well placed\fixed THA  Assessment:   2 Days Post-Op Procedure(s) (LRB): RIGHT TOTAL HIP ARTHROPLASTY ANTERIOR APPROACH (Right) ADDITIONAL DIAGNOSIS:  Expected Acute Blood Loss Anemia, Diabetes  Plan: PT/OT WBAT, THA  DVT Prophylaxis: SCDx72 hrs, Eliquis 2.5 mg BID x 2 weeks  DISCHARGE PLAN: Home  DISCHARGE NEEDS: HHPT,  Walker and 3-in-1 comode seat

## 2017-03-04 NOTE — Discharge Summary (Signed)
Patient ID: [name] G Lober MRN: [id number] DOB/AGE: November 14, 1950 67 y.o.  Admit date: 03/02/2017 Discharge date: 03/04/2017  Admission Diagnoses:  Principal Problem:   Osteoarthritis of right hip Active Problems:   Primary osteoarthritis of right hip   Discharge Diagnoses:  Same  Past Medical History:  Diagnosis Date  . Arthritis    back & hip- R  . Breast cancer ([place]) 04/15/12   left-biopsy  . Breast cancer, right breast ([place]) 05/14/12   right mastectomy  . Bronchitis   . Cancer ([place])    breast  . Chemotherapy-induced cardiomyopathy ([place])   . CHF with unknown LVEF ([place]) 03/09/2013  . Diabetes mellitus   . Diabetes mellitus without complication ([place])   . GERD (gastroesophageal reflux disease)    pt. experiencing on occasional , h/o og gastroparesis   . Heart murmur   . Hiatal hernia   . Osteopenia   . Pneumonia    treated as an outpt.   . Sinus problem   . Sleep apnea    sleep minimal concern- " along time ago"    Surgeries: Procedure(s): RIGHT TOTAL HIP ARTHROPLASTY ANTERIOR APPROACH on 03/02/2017   Consultants:   Discharged Condition: Improved  Hospital Course: [name] G Heatley is an 67 y.o. female who was admitted 03/02/2017 for operative treatment ofOsteoarthritis of right hip. Patient has severe unremitting pain that affects sleep, daily activities, and work/hobbies. After pre-op clearance the patient was taken to the operating room on 03/02/2017 and underwent  Procedure(s): RIGHT TOTAL HIP ARTHROPLASTY ANTERIOR APPROACH.    Patient was given perioperative antibiotics:  Anti-infectives (From admission, onward)   Start     Dose/Rate Route Frequency Ordered Stop   03/02/17 0855  vancomycin (VANCOCIN) IVPB 1000 mg/200 mL premix     1,000 mg 200 mL/hr over 60 Minutes Intravenous On call to O.R. 03/02/17 0855 03/02/17 1140       Patient was given sequential compression devices, early ambulation, and chemoprophylaxis to prevent DVT.  Patient benefited  maximally from hospital stay and there were no complications.    Recent vital signs:  Patient Vitals for the past 24 hrs:  BP Temp Temp src Pulse Resp SpO2  03/04/17 0500 (!) 104/39 99 F (37.2 C) Oral (!) 110 15 99 %  03/03/17 1939 (!) 133/55 98.1 F (36.7 C) Oral 98 16 99 %  03/03/17 1300 (!) 124/58 (!) 97.3 F (36.3 C) Oral 90 16 97 %     Recent laboratory studies:  Recent Labs    03/03/17 0709 03/04/17 0434  WBC 8.8 10.2  HGB 10.3* 9.9*  HCT 32.2* 30.1*  PLT 238 238     Discharge Medications:   Allergies as of 03/04/2017      Reactions   Darvon Hives, Itching   Penicillins Hives, Itching   Has patient had a PCN reaction causing immediate rash, facial/tongue/throat swelling, SOB or lightheadedness with hypotension: No Has patient had a PCN reaction causing severe rash involving mucus membranes or skin necrosis: No Has patient had a PCN reaction that required hospitalization: No Has patient had a PCN reaction occurring within the last 10 years: No If all of the above answers are "NO", then may proceed with Cephalosporin use.      Medication List    TAKE these medications   apixaban 2.5 MG Tabs tablet Commonly known as:  ELIQUIS Take 1 tablet (2.5 mg total) by mouth 2 (two) times daily.   BLUE-EMU MAXIMUM STRENGTH EX Apply 1 application topically daily.  CURCUMIN 95 PO Take 1 capsule by mouth daily.   JANUVIA 100 MG tablet Generic drug:  sitaGLIPtin Take 100 mg by mouth daily.   KRILL OIL PO Take 1 capsule by mouth daily.   letrozole 2.5 MG tablet Commonly known as:  FEMARA TAKE 1 TABLET (2.5 MG TOTAL) BY MOUTH DAILY.   magnesium oxide 400 MG tablet Commonly known as:  MAG-OX Take 400 mg by mouth daily.   MOVE FREE JOINT HEALTH ADVANCE PO Take 1 tablet by mouth daily.   MULTIVITAMIN PO Take 1 tablet by mouth daily.   oxyCODONE-acetaminophen 5-325 MG tablet Commonly known as:  ROXICET Take 1 tablet by mouth every 4 (four) hours as needed.    PRESCRIPTION MEDICATION Take 1 tablet by mouth every evening. Domperidone 10 mg   PROBIOTIC PO Take 1 capsule by mouth daily.   rosuvastatin 5 MG tablet Commonly known as:  CRESTOR Take 1 tablet (5 mg total) by mouth daily at 6 PM. What changed:  when to take this   tiZANidine 2 MG tablet Commonly known as:  ZANAFLEX Take 1 tablet (2 mg total) by mouth every 6 (six) hours as needed for muscle spasms.   vitamin C 500 MG tablet Commonly known as:  ASCORBIC ACID Take 500 mg by mouth daily.   VITAMIN D PO Take 1 tablet by mouth daily.            Durable Medical Equipment  (From admission, onward)        Start     Ordered   03/02/17 1425  DME Walker rolling  Once    Question:  Patient needs a walker to treat with the following condition  Answer:  Status post right hip replacement   03/02/17 1424   03/02/17 1425  DME 3 n 1  Once     03/02/17 1424   03/02/17 1425  DME Bedside commode  Once    Question:  Patient needs a bedside commode to treat with the following condition  Answer:  Status post right hip replacement   03/02/17 1424      Diagnostic Studies: Dg Chest 2 View  Result Date: 03/02/2017 CLINICAL DATA:  Patient for right hip surgery today. Preoperative examination. EXAM: CHEST  2 VIEW COMPARISON:  PA and lateral chest 05/15/2016 and CT chest 07/15/2013. FINDINGS: The lungs are clear. Heart size is normal. No pneumothorax or pleural effusion. No acute bony abnormality. Bilateral breast implants and surgical clips left axilla are noted. IMPRESSION: No acute disease. Electronically Signed   By: Inge Rise M.D.   On: 03/02/2017 09:40   Dg C-arm 1-60 Min  Result Date: 03/02/2017 CLINICAL DATA:  67 year old female post right hip replacement. Initial encounter. EXAM: DG C-ARM 61-120 MIN; OPERATIVE RIGHT HIP WITH PELVIS COMPARISON:  01/01/2016. FINDINGS: Post total right hip replacement which appears in satisfactory position without complication noted on frontal  projection. Limited imaging left hip. IMPRESSION: Post total right hip replacement which appears in satisfactory position without complication noted on frontal projection. Electronically Signed   By: Genia Del M.D.   On: 03/02/2017 12:17   Dg Hip Operative Unilat W Or W/o Pelvis Right  Result Date: 03/02/2017 CLINICAL DATA:  67 year old female post right hip replacement. Initial encounter. EXAM: DG C-ARM 61-120 MIN; OPERATIVE RIGHT HIP WITH PELVIS COMPARISON:  01/01/2016. FINDINGS: Post total right hip replacement which appears in satisfactory position without complication noted on frontal projection. Limited imaging left hip. IMPRESSION: Post total right hip replacement which appears in  satisfactory position without complication noted on frontal projection. Electronically Signed   By: Genia Del M.D.   On: 03/02/2017 12:17    Disposition: 01-Home or Self Care  Discharge Instructions    Call MD / Call 911   Complete by:  As directed    If you experience chest pain or shortness of breath, CALL 911 and be transported to the hospital emergency room.  If you develope a fever above 101 F, pus (white drainage) or increased drainage or redness at the wound, or calf pain, call your surgeon's office.   Constipation Prevention   Complete by:  As directed    Drink plenty of fluids.  Prune juice may be helpful.  You may use a stool softener, such as Colace (over the counter) 100 mg twice a day.  Use MiraLax (over the counter) for constipation as needed.   Diet - low sodium heart healthy   Complete by:  As directed    Driving restrictions   Complete by:  As directed    No driving for 2 weeks   Follow the hip precautions as taught in Physical Therapy   Complete by:  As directed    Increase activity slowly as tolerated   Complete by:  As directed    Patient may shower   Complete by:  As directed    You may shower without a dressing once there is no drainage.  Do not wash over the wound.  If  drainage remains, cover wound with plastic wrap and then shower.      Follow-up Information    Frederik Pear, MD Follow up in 2 week(s).   Specialty:  Orthopedic Surgery Contact information: [place] [address] [phone]        [facility] up.   Specialty:  [place] Why:  A representative from [facility] will contact you to arrange start date and time for your therapy. Contact information: [address] [place] [address] [phone]            Signed: Joanell Rising 03/04/2017, 7:32 AM

## 2017-03-04 NOTE — Progress Notes (Signed)
Physical Therapy Treatment Patient Details Name: [name] [name] MRN: [id number] DOB: April 17, 1950 Today's Date: 03/04/2017    History of Present Illness Pt is a 67 y/o female s/p R THA, direct anterior approach. PMH includes R breast cancer s/p mastectomy and reconstruction, DM, and chemotherapy induced cardiomyopathy.     PT Comments    Patient in bed, pleasant and willing to participate with second PT session this late morning. She continues to be able to perform all functional bed mobility and transfers with S and rolling walker, good safety awareness and no significant unsteadiness noted. Introduced navigation of single step with rolling walker to allow patient to access her sunroom at home, she was able to perform stair navigation well, with correct sequencing, and without significant unsteadiness, good safety awareness throughout. Continued to gait train approximately 542f in hallway this morning with ongoing VC to maintain consistency of gait mechanics. Patient and her husband without questions or concerns about return home or about exercise program, she was left up in the chair with all needs otherwise met this morning.     Follow Up Recommendations  DC plan and follow up therapy as arranged by surgeon;Supervision for mobility/OOB     Equipment Recommendations  None recommended by PT    Recommendations for Other Services       Precautions / Restrictions Precautions Precautions: None;Anterior Hip Precaution Comments: reviewed seated/supine/standing ther ex with patient  Restrictions Weight Bearing Restrictions: Yes RLE Weight Bearing: Weight bearing as tolerated    Mobility  Bed Mobility Overal bed mobility: Needs Assistance Bed Mobility: Supine to Sit     Supine to sit: Supervision     General bed mobility comments: increased time and effort   Transfers Overall transfer level: Needs assistance Equipment used: Rolling walker (2 wheeled) Transfers: Sit to/from  Stand Sit to Stand: Supervision         General transfer comment: S for safety, improving in hand placement   Ambulation/Gait Ambulation/Gait assistance: Supervision Ambulation Distance (Feet): 500 Feet Assistive device: Rolling walker (2 wheeled) Gait Pattern/deviations: Step-through pattern;Decreased step length - left;Decreased stance time - right;Decreased stride length;Decreased dorsiflexion - right;Decreased weight shift to right;Antalgic;Trunk flexed     General Gait Details: VC for equal step lengths, heel toe pattern, upright posture; patient continues to require cues to maintain gait mechanics especially when distracted by obstacles or conversation in hallway    Stairs Stairs: Yes   Stair Management: No rails;Forwards Number of Stairs: 1 General stair comments: VC/min guard for safe navigation of single step with walker; patient able to perform with good form and adherence to sequencing with PT, no significant unsteadiness noted   Wheelchair Mobility    Modified Rankin (Stroke Patients Only)       Balance Overall balance assessment: Needs assistance Sitting-balance support: No upper extremity supported;Feet supported Sitting balance-Leahy Scale: Good     Standing balance support: Bilateral upper extremity supported;During functional activity Standing balance-Leahy Scale: Good Standing balance comment: reliant on B UE support                             Cognition Arousal/Alertness: Awake/alert Behavior During Therapy: WFL for tasks assessed/performed Overall Cognitive Status: Within Functional Limits for tasks assessed                                        Exercises  General Comments        Pertinent Vitals/Pain Pain Assessment: 0-10 Pain Score: 6  Pain Location: R hip  Pain Descriptors / Indicators: Aching;Operative site guarding Pain Intervention(s): Limited activity within patient's tolerance;Monitored during  session;Repositioned    Home Living                      Prior Function            PT Goals (current goals can now be found in the care plan section) Acute Rehab PT Goals Patient Stated Goal: to go home  PT Goal Formulation: With patient Time For Goal Achievement: 03/16/17 Potential to Achieve Goals: Good Progress towards PT goals: Progressing toward goals    Frequency    7X/week      PT Plan Current plan remains appropriate    Co-evaluation              AM-PAC PT "6 Clicks" Daily Activity  Outcome Measure  Difficulty turning over in bed (including adjusting bedclothes, sheets and blankets)?: None Difficulty moving from lying on back to sitting on the side of the bed? : None Difficulty sitting down on and standing up from a chair with arms (e.g., wheelchair, bedside commode, etc,.)?: None Help needed moving to and from a bed to chair (including a wheelchair)?: A Little Help needed walking in hospital room?: A Little Help needed climbing 3-5 steps with a railing? : A Little 6 Click Score: 21    End of Session Equipment Utilized During Treatment: Gait belt Activity Tolerance: Patient tolerated treatment well Patient left: in chair;with call bell/phone within reach;with family/visitor present   PT Visit Diagnosis: Other abnormalities of gait and mobility (R26.89);Pain Pain - Right/Left: Right Pain - part of body: Hip     Time: 9450-3888 PT Time Calculation (min) (ACUTE ONLY): 29 min  Charges:  $Gait Training: 8-22 mins $Self Care/Home Management: 8-22                    G Codes:       Deniece Ree PT, DPT, CBIS  Supplemental Physical Therapist [place]   Pager [phone]

## 2017-03-04 NOTE — Progress Notes (Signed)
Physical Therapy Treatment Patient Details Name: [name] [name] MRN: [id number] DOB: 10/06/1950 Today's Date: 03/04/2017    History of Present Illness Pt is a 66 y/o female s/p R THA, direct anterior approach. PMH includes R breast cancer s/p mastectomy and reconstruction, DM, and chemotherapy induced cardiomyopathy.     PT Comments    Patient received in bed, pleasant and willing to participate in skilled PT session this morning. She is generally S for bed mobility and functional transfers with occasional VC for safety and sequencing, and was able to perform toileting and self care with S from PT as well today. Able to gait train approximately 500ft in hallway this morning with cues to correct gait mechanics, no significant unsteadiness noted during gait period. She was left up in the chair with husband present and all needs otherwise met this morning.     Follow Up Recommendations  DC plan and follow up therapy as arranged by surgeon;Supervision for mobility/OOB     Equipment Recommendations  None recommended by PT    Recommendations for Other Services       Precautions / Restrictions Precautions Precautions: None;Anterior Hip Precaution Comments: reviewed seated/supine/standing ther ex with patient  Restrictions Weight Bearing Restrictions: Yes RLE Weight Bearing: Weight bearing as tolerated    Mobility  Bed Mobility Overal bed mobility: Needs Assistance Bed Mobility: Supine to Sit     Supine to sit: Supervision     General bed mobility comments: increased time and effort   Transfers Overall transfer level: Needs assistance Equipment used: Rolling walker (2 wheeled) Transfers: Sit to/from Stand Sit to Stand: Supervision         General transfer comment: S for safety, VC for hand placement   Ambulation/Gait Ambulation/Gait assistance: Supervision Ambulation Distance (Feet): 500 Feet Assistive device: Rolling walker (2 wheeled) Gait Pattern/deviations:  Step-through pattern;Decreased step length - left;Decreased stance time - right;Decreased stride length;Decreased dorsiflexion - right;Decreased weight shift to right;Antalgic;Trunk flexed     General Gait Details: VC for equal step lengths, heel toe pattern, upright posture; patient continues to require cues to maintain gait mechanics especially when distracted by obstacles or conversation in hallway    Stairs            Wheelchair Mobility    Modified Rankin (Stroke Patients Only)       Balance Overall balance assessment: Needs assistance Sitting-balance support: No upper extremity supported;Feet supported Sitting balance-Leahy Scale: Good     Standing balance support: Bilateral upper extremity supported;During functional activity Standing balance-Leahy Scale: Good Standing balance comment: reliant on B UE support                             Cognition Arousal/Alertness: Awake/alert Behavior During Therapy: WFL for tasks assessed/performed Overall Cognitive Status: Within Functional Limits for tasks assessed                                        Exercises      General Comments        Pertinent Vitals/Pain Pain Assessment: 0-10 Pain Score: 6  Pain Location: R hip  Pain Descriptors / Indicators: Aching;Operative site guarding Pain Intervention(s): Limited activity within patient's tolerance;Monitored during session    Home Living                        Prior Function            PT Goals (current goals can now be found in the care plan section) Acute Rehab PT Goals Patient Stated Goal: to go home  PT Goal Formulation: With patient Time For Goal Achievement: 03/16/17 Potential to Achieve Goals: Good Progress towards PT goals: Progressing toward goals    Frequency    7X/week      PT Plan Current plan remains appropriate    Co-evaluation              AM-PAC PT "6 Clicks" Daily Activity  Outcome  Measure  Difficulty turning over in bed (including adjusting bedclothes, sheets and blankets)?: None Difficulty moving from lying on back to sitting on the side of the bed? : None Difficulty sitting down on and standing up from a chair with arms (e.g., wheelchair, bedside commode, etc,.)?: None Help needed moving to and from a bed to chair (including a wheelchair)?: A Little Help needed walking in hospital room?: A Little Help needed climbing 3-5 steps with a railing? : A Little 6 Click Score: 21    End of Session   Activity Tolerance: Patient tolerated treatment well Patient left: in chair;with call bell/phone within reach;with family/visitor present   PT Visit Diagnosis: Other abnormalities of gait and mobility (R26.89);Pain Pain - Right/Left: Right Pain - part of body: Hip     Time: 0827-0850 PT Time Calculation (min) (ACUTE ONLY): 23 min  Charges:  $Gait Training: 23-37 mins                    G Codes:       Deniece Ree PT, DPT, CBIS  Supplemental Physical Therapist [place]   Pager [phone]

## 2017-03-05 DIAGNOSIS — Z87891 Personal history of nicotine dependence: Secondary | ICD-10-CM | POA: Diagnosis not present

## 2017-03-05 DIAGNOSIS — Z7984 Long term (current) use of oral hypoglycemic drugs: Secondary | ICD-10-CM | POA: Diagnosis not present

## 2017-03-05 DIAGNOSIS — M8580 Other specified disorders of bone density and structure, unspecified site: Secondary | ICD-10-CM | POA: Diagnosis not present

## 2017-03-05 DIAGNOSIS — E119 Type 2 diabetes mellitus without complications: Secondary | ICD-10-CM | POA: Diagnosis not present

## 2017-03-05 DIAGNOSIS — I509 Heart failure, unspecified: Secondary | ICD-10-CM | POA: Diagnosis not present

## 2017-03-05 DIAGNOSIS — Z471 Aftercare following joint replacement surgery: Secondary | ICD-10-CM | POA: Diagnosis not present

## 2017-03-10 DIAGNOSIS — E119 Type 2 diabetes mellitus without complications: Secondary | ICD-10-CM | POA: Diagnosis not present

## 2017-03-10 DIAGNOSIS — Z471 Aftercare following joint replacement surgery: Secondary | ICD-10-CM | POA: Diagnosis not present

## 2017-03-10 DIAGNOSIS — Z87891 Personal history of nicotine dependence: Secondary | ICD-10-CM | POA: Diagnosis not present

## 2017-03-10 DIAGNOSIS — I509 Heart failure, unspecified: Secondary | ICD-10-CM | POA: Diagnosis not present

## 2017-03-10 DIAGNOSIS — M8580 Other specified disorders of bone density and structure, unspecified site: Secondary | ICD-10-CM | POA: Diagnosis not present

## 2017-03-10 DIAGNOSIS — Z7984 Long term (current) use of oral hypoglycemic drugs: Secondary | ICD-10-CM | POA: Diagnosis not present

## 2017-03-11 DIAGNOSIS — M8580 Other specified disorders of bone density and structure, unspecified site: Secondary | ICD-10-CM | POA: Diagnosis not present

## 2017-03-11 DIAGNOSIS — Z471 Aftercare following joint replacement surgery: Secondary | ICD-10-CM | POA: Diagnosis not present

## 2017-03-11 DIAGNOSIS — Z87891 Personal history of nicotine dependence: Secondary | ICD-10-CM | POA: Diagnosis not present

## 2017-03-11 DIAGNOSIS — Z7984 Long term (current) use of oral hypoglycemic drugs: Secondary | ICD-10-CM | POA: Diagnosis not present

## 2017-03-11 DIAGNOSIS — E119 Type 2 diabetes mellitus without complications: Secondary | ICD-10-CM | POA: Diagnosis not present

## 2017-03-11 DIAGNOSIS — I509 Heart failure, unspecified: Secondary | ICD-10-CM | POA: Diagnosis not present

## 2017-03-13 DIAGNOSIS — Z87891 Personal history of nicotine dependence: Secondary | ICD-10-CM | POA: Diagnosis not present

## 2017-03-13 DIAGNOSIS — Z7984 Long term (current) use of oral hypoglycemic drugs: Secondary | ICD-10-CM | POA: Diagnosis not present

## 2017-03-13 DIAGNOSIS — E119 Type 2 diabetes mellitus without complications: Secondary | ICD-10-CM | POA: Diagnosis not present

## 2017-03-13 DIAGNOSIS — Z471 Aftercare following joint replacement surgery: Secondary | ICD-10-CM | POA: Diagnosis not present

## 2017-03-13 DIAGNOSIS — I509 Heart failure, unspecified: Secondary | ICD-10-CM | POA: Diagnosis not present

## 2017-03-13 DIAGNOSIS — M8580 Other specified disorders of bone density and structure, unspecified site: Secondary | ICD-10-CM | POA: Diagnosis not present

## 2017-03-17 DIAGNOSIS — Z96641 Presence of right artificial hip joint: Secondary | ICD-10-CM | POA: Diagnosis not present

## 2017-03-17 DIAGNOSIS — M1611 Unilateral primary osteoarthritis, right hip: Secondary | ICD-10-CM | POA: Diagnosis not present

## 2017-03-17 DIAGNOSIS — Z471 Aftercare following joint replacement surgery: Secondary | ICD-10-CM | POA: Diagnosis not present

## 2017-03-18 DIAGNOSIS — Z96641 Presence of right artificial hip joint: Secondary | ICD-10-CM | POA: Diagnosis not present

## 2017-03-18 DIAGNOSIS — Z471 Aftercare following joint replacement surgery: Secondary | ICD-10-CM | POA: Diagnosis not present

## 2017-03-27 DIAGNOSIS — Z01419 Encounter for gynecological examination (general) (routine) without abnormal findings: Secondary | ICD-10-CM | POA: Diagnosis not present

## 2017-04-28 DIAGNOSIS — M25551 Pain in right hip: Secondary | ICD-10-CM | POA: Diagnosis not present

## 2017-04-29 DIAGNOSIS — M545 Low back pain: Secondary | ICD-10-CM | POA: Diagnosis not present

## 2017-05-24 IMAGING — RF DG UGI W/ KUB
5 series · 14 of 19 positions shown · non-contrast
Comparison: 08/02/2013

CLINICAL DATA: Abdominal pain. History of gastric bypass surgery
and duodenum ulcer.

EXAM:
UPPER GI SERIES WITHOUT KUB
TECHNIQUE: Routine upper GI series was performed with thin and high density
barium.
FLUOROSCOPY TIME:  Fluoroscopy Time:  2 minutes and 24 seconds
Radiation Exposure Index (if provided by the fluoroscopic device):
725 mGy
Number of Acquired Spot Images: 4

[Series 1: one shot · 1 of 1 slices shown (1 of 2)]
[im 1/1]
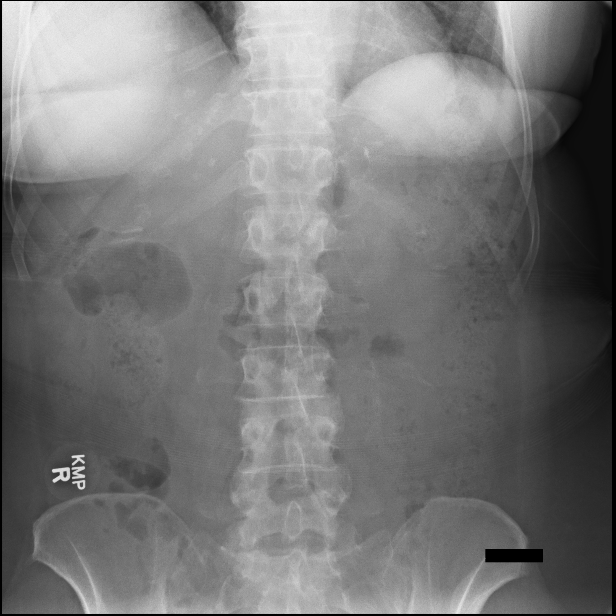

[Series 2: sequence · 3 of 28 frames shown (1 of 3)]
[frame 5/28]
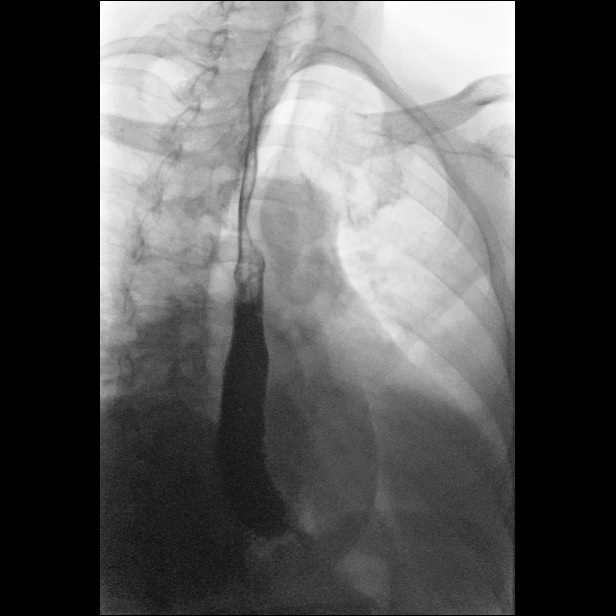
[frame 15/28]
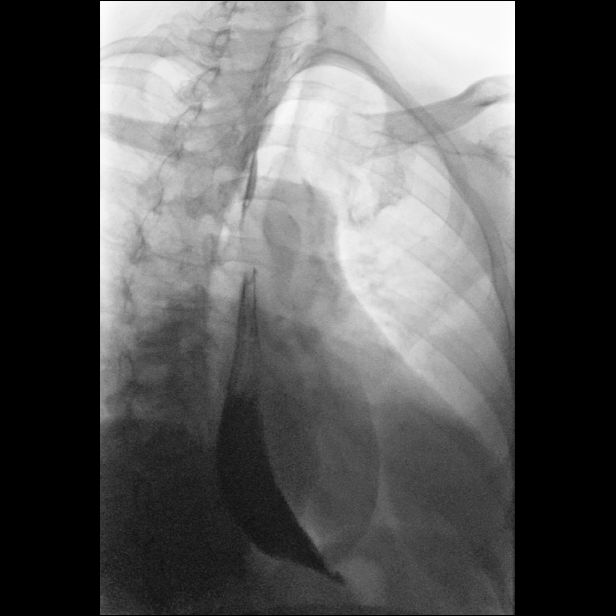
[frame 24/28]
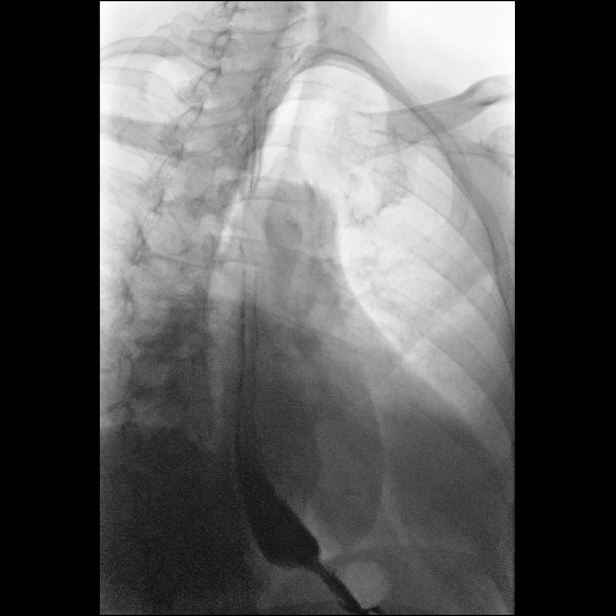

[Series 3: sequence · 3 of 22 frames shown (2 of 3)]
[frame 12/22]
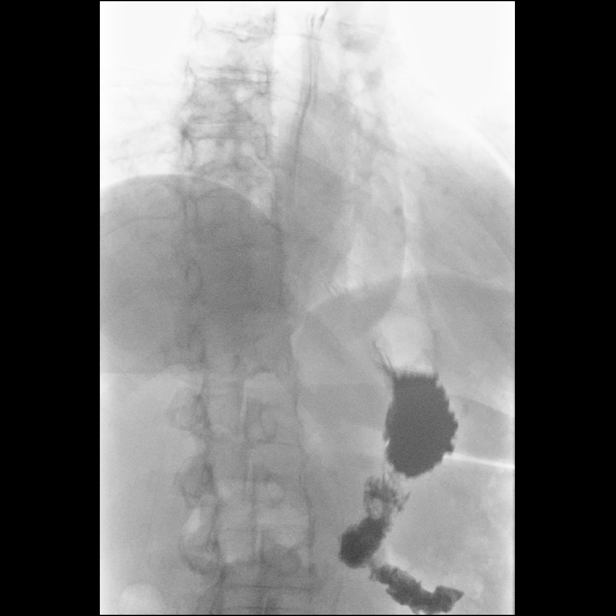
[frame 17/22]
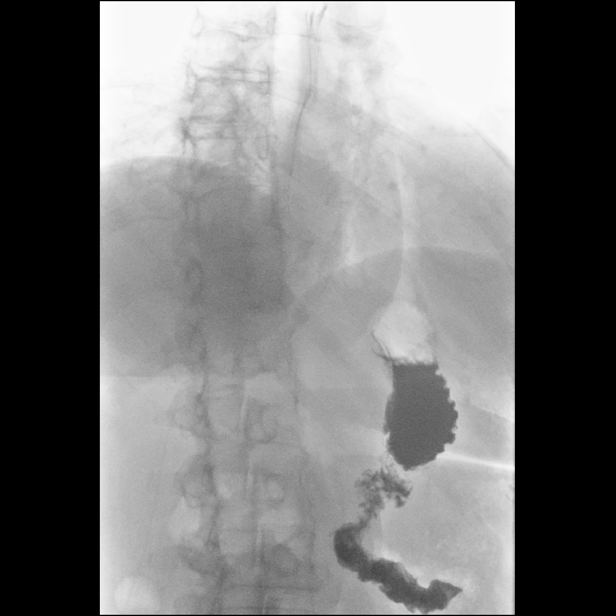
[frame 19/22]
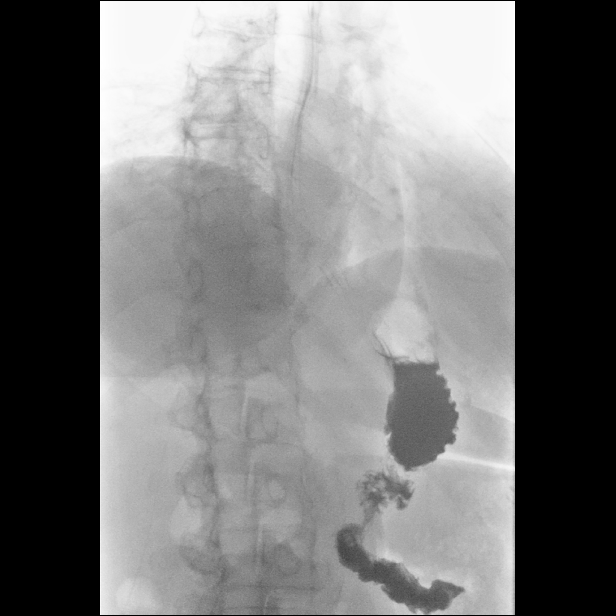

[Series 4: sequence · 3 of 32 frames shown (3 of 3)]
[frame 17/32]
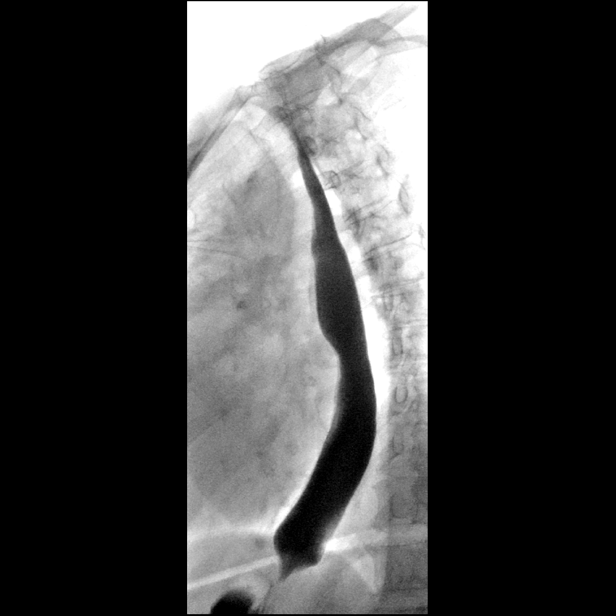
[frame 28/32]
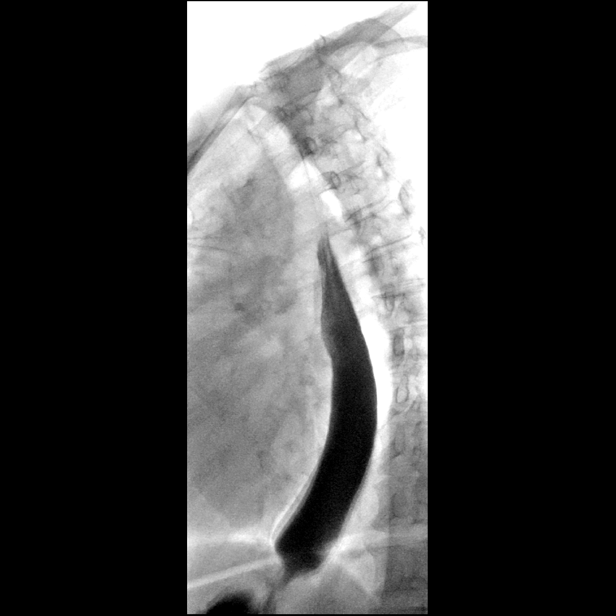
[frame 32/32]
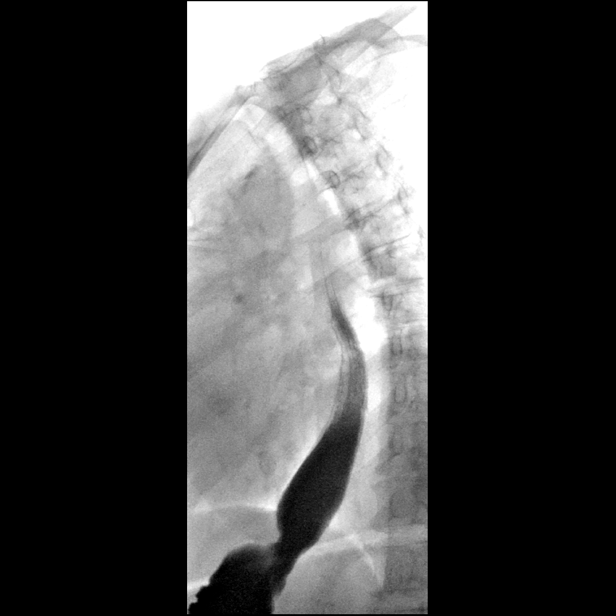

[Series 5: one shot · 4 of 6 slices shown (2 of 2)]
[im 2/6]
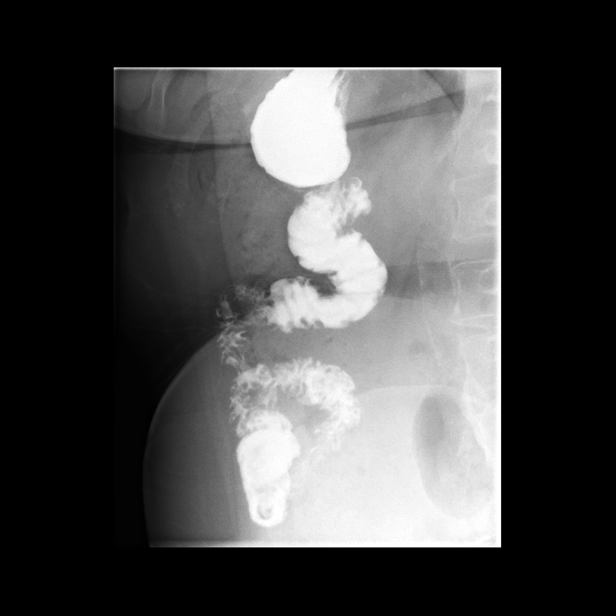
[im 3/6]
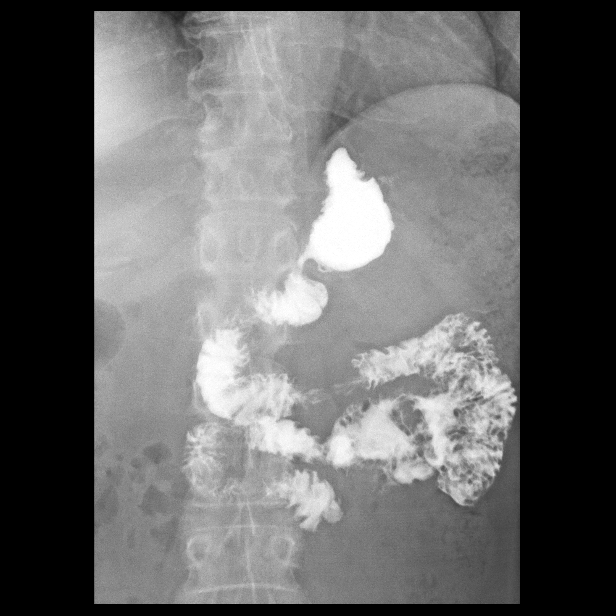
[im 4/6]
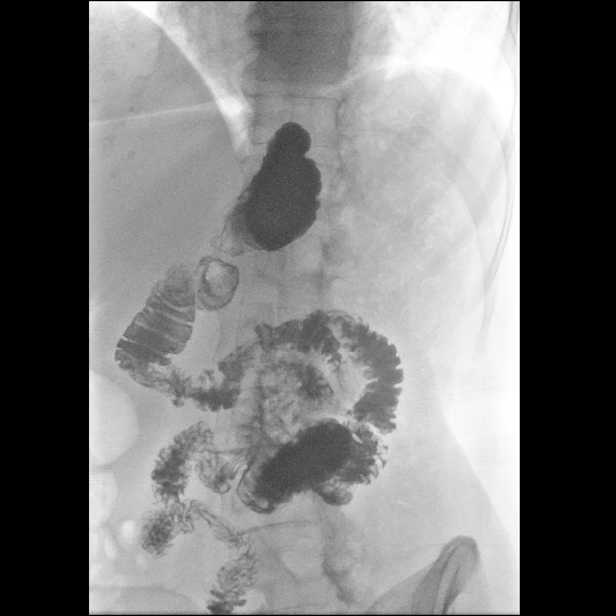
[im 6/6]
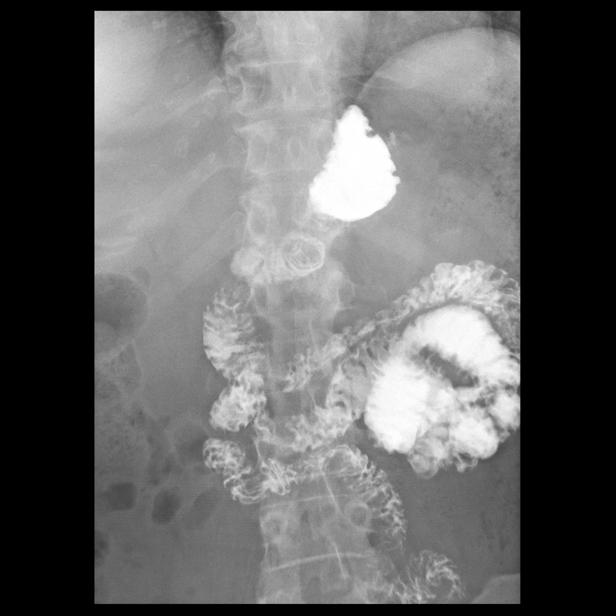

[14 of 19 positions shown; findings below may reference images not displayed]

FINDINGS: Initial barium swallows demonstrate normal esophageal motility. No
intrinsic or extrinsic lesions of the esophagus were identified. No
hiatal hernia or GE reflux demonstrated.

Postoperative changes from gastric bypass surgery. Mild smooth
narrowing at the anastomosis without definite ulcer. Normal full
ovarian out of the stomach and into the small bowel. The small bowel
loops appear normal with normal mucosal fold pattern. No obstructive
findings.
IMPRESSION: Mild smooth narrowing at the anastomosis but no definite ulcer or
mass. No obstruction of flow of barium out of the stomach.

Normal appearance of the proximal small bowel.

Normal esophagus.

## 2017-10-14 DIAGNOSIS — N390 Urinary tract infection, site not specified: Secondary | ICD-10-CM | POA: Diagnosis not present

## 2017-10-14 DIAGNOSIS — M545 Low back pain: Secondary | ICD-10-CM | POA: Diagnosis not present

## 2017-11-03 DIAGNOSIS — R030 Elevated blood-pressure reading, without diagnosis of hypertension: Secondary | ICD-10-CM | POA: Diagnosis not present

## 2017-11-03 DIAGNOSIS — E139 Other specified diabetes mellitus without complications: Secondary | ICD-10-CM | POA: Diagnosis not present

## 2017-11-03 DIAGNOSIS — Z23 Encounter for immunization: Secondary | ICD-10-CM | POA: Diagnosis not present

## 2017-11-03 DIAGNOSIS — E785 Hyperlipidemia, unspecified: Secondary | ICD-10-CM | POA: Diagnosis not present

## 2017-11-10 DIAGNOSIS — Z09 Encounter for follow-up examination after completed treatment for conditions other than malignant neoplasm: Secondary | ICD-10-CM | POA: Diagnosis not present

## 2017-11-10 DIAGNOSIS — Z96641 Presence of right artificial hip joint: Secondary | ICD-10-CM | POA: Diagnosis not present

## 2017-11-10 DIAGNOSIS — M25551 Pain in right hip: Secondary | ICD-10-CM | POA: Diagnosis not present

## 2017-11-10 DIAGNOSIS — M25522 Pain in left elbow: Secondary | ICD-10-CM | POA: Diagnosis not present

## 2017-12-28 NOTE — Progress Notes (Signed)
[place]  Telephone:(336) [phone] Fax:(336) [phone]     ID: [name] G Herberg OB: 1950-10-28  MR#: [id number]  GNO#:[id number]  Patient Care Team: [name], MD as PCP - General (Internal Medicine) [place] Givens, MD as Consulting Physician (Obstetrics and Gynecology) Fanny Skates, MD as Consulting Physician (General Surgery) Crissie Reese, MD as Consulting Physician (Plastic Surgery) Belva Crome, MD as Consulting Physician (Cardiology)   CHIEF COMPLAINT: bilateral breast cancers, estrogen receptor positive  CURRENT TREATMENT: letrozole  BREAST CANCER HISTORY: From Dr. Dana Allan original intake note, 04/27/2012:  "[name] [name] is a 67 y.o. female. With medical history significant for diabetes, heart murmurs, hiatal hernia. Patient's labs normal mammogram was 2 years ago. This year she went on to have a screening mammogram performed that showed 2 areas of concern on the left side.calcifications were noted in the left. In the right breast a possible mass warrants in further evaluation.  On 03/29/2012 patient underwent a bilateral diagnostic mammogram and right breast ultrasound. A spot magnification images demonstrate suspicious group of pleomorphic microcalcifications over the outer lower left breast with additional suspicious group of pleomorphic microcalcifications over the outer midportion of the left periareolar region. Spot compression images of the right breast demonstrate persistence of a 1 cm density at the edge of the film in the deep third of the right inner breast. Ultrasound performed showed no focal abnormality over the entire in her right breast to correspond to the mammographic density. Patient was recommended stereotactic core needle biopsy of the 2 groups of suspicious left breast microcalcifications. Because patient and her husband wanted bilateral mastectomies MRI was not performed.on 04/15/2012 patient had needle core biopsy performed  of the 2 areas in the left breast. The left needle core biopsy in the lower breast revealed ductal carcinoma in situ with associated comedo necrosis and calcifications with the in situ carcinoma. It was ER +100% PR +12%. 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 Tumor was ER +100% PR +11% proliferation marker Ki-67 elevated at 80% HER-2/neu showed amplification with a ratio of 6.50. Patient was seen by Dr. Fanny Skates. Patient and her family desire bilateral mastectomies with immediate reconstruction. She is now seen in medical oncology for discussion of adjuvant therapy since patient does have a HER-2 positive ER positive breast cancer. Overall she's doing well she is accompanied by her husband and she is without any complaints. Her case was discussed at the multidisciplinary breast conference. Pathology and radiology were reviewed."  Her subsequent history is detailed below  INTERVAL HISTORY: [name] returns today for follow-up and treatment of her estrogen receptor positive breast cancer. She is accompanied by her husband, [name] [name].  She continues on letrozole, adding she has experienced hot flashes and hair loss, but those symptoms were manageable.  Vaginal dryness has been a minor problem.   REVIEW OF SYSTEMS: [name] is doing well overall. However, her energy levels have decreased, adding she gets tired easier. She is retired, but stays active. She uses the treadmill and stationary bike at home for exercise and tries to exercise about 5 days a week. The patient worries a lot and occasionally has noticed heart palpations and SOB, even when resting. During that time she will take her blood pressure which will tells her what her heart rate is as well. Of note she has an "innocent" heart murmur. The patient denies unusual headaches, visual changes, nausea, vomiting, or dizziness. There has been no unusual  cough, phlegm production, or  pleurisy. This been no change in bowel or bladder habits. The patient denies unexplained weight loss, bleeding, rash, or fever. A detailed review of systems was otherwise noncontributory.   PAST MEDICAL HISTORY: Past Medical History:  Diagnosis Date  . Arthritis    back & hip- R  . Breast cancer ([place]) 04/15/12   left-biopsy  . Breast cancer, right breast ([place]) 05/14/12   right mastectomy  . Bronchitis   . Cancer ([place])    breast  . 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 CHF with unknown LVEF ([place]) 03/09/2013  . Diabetes mellitus   . Diabetes mellitus without complication ([place])   . GERD (gastroesophageal reflux disease)    pt. experiencing on occasional , h/o og gastroparesis   . Heart murmur   . Hiatal hernia   . Osteopenia   . Pneumonia    treated as an outpt.   . Sinus problem   . Sleep apnea    sleep minimal concern- " along time ago"    PAST SURGICAL HISTORY: Past Surgical History:  Procedure Laterality Date  . AXILLARY SENTINEL NODE BIOPSY Left 05/14/2012   Procedure: AXILLARY SENTINEL NODE BIOPSY;  Surgeon: Adin Hector, MD;  Location: [facility];  Service: General;  Laterality: Left;  . BREAST RECONSTRUCTION WITH PLACEMENT OF TISSUE EXPANDER AND FLEX HD (ACELLULAR HYDRATED DERMIS) Bilateral 05/14/2012   Procedure: BREAST RECONSTRUCTION WITH PLACEMENT OF TISSUE EXPANDER AND FLEX HD (ACELLULAR HYDRATED DERMIS);  Surgeon: Crissie Reese, MD;  Location: [facility];  Service: Plastics;  Laterality: Bilateral;  . BUNIONECTOMY Bilateral   . CESAREAN SECTION  1981, 1983  . FOOT SURGERY    . GASTRIC BYPASS     1st surgery- at [facility], then an emergent surgery [facility]  [facility]- for perforation of stomach & then a surgery for debridement   . PORT-A-CATH REMOVAL Right 09/12/2013   Procedure: REMOVAL PORT-A-CATH;  Surgeon: Adin Hector, MD;  Location: [facility];  Service: General;  Laterality: Right;  . PORTACATH PLACEMENT N/A 05/14/2012   Procedure: INSERTION  PORT-A-CATH;  Surgeon: Adin Hector, MD;  Location: [place];  Service: General;  Laterality: N/A;  . TONSILLECTOMY    . TOTAL HIP ARTHROPLASTY Right 03/02/2017  . TOTAL HIP ARTHROPLASTY Right 03/02/2017   Procedure: RIGHT TOTAL HIP ARTHROPLASTY ANTERIOR APPROACH;  Surgeon: Frederik Pear, MD;  Location: [place];  Service: Orthopedics;  Laterality: Right;  . TOTAL MASTECTOMY Bilateral 05/14/2012   Procedure: TOTAL MASTECTOMY;  Surgeon: Adin Hector, MD;  Location: [place];  Service: General;  Laterality: Bilateral;  . tummy tuck  1999  . WOUND DEBRIDEMENT  01/14/2011   Procedure: DEBRIDEMENT ABDOMINAL WOUND;  Surgeon: Adin Hector, MD;  Location: [place];  Service: General;  Laterality: N/A;  wound debridement and debridement on the abdomen    FAMILY HISTORY Family History  Problem Relation Age of Onset  . Heart attack Father   . Diabetes Father   . Heart attack Brother   . Diabetes Brother   . Diabetes Sister   . Breast cancer Sister 45  . Diabetes Mother   . Breast cancer Mother 31  . Parkinson's disease Sister   . Heart attack Brother    the patient's father died 2 days after prostate cancer surgery, possibly from a blood clot. The patient's mother lived to be 69 years old. She had been diagnosed with breast cancer around the age of 67. The patient has 3 brothers,  4 sisters. One sister was diagnosed with breast cancer at the age of 75. There is no history of ovarian cancer in the family. The patient underwent genetic testing October 2014, with a VUS as the only finding  GYNECOLOGIC HISTORY:  Menarche age 70, first live birth age 78, the patient is [place] P2. She went through the change of life approximately age 29. 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  She is now retired.  He is Broadus John "Joe" Malatesta, who is a former judge and currently a Architectural technologist. The patient's daughter Garnette Scheuermann is an  attorney in [place]. The patient's son Broadus John completed a Masters in Librarian, academic in [place]. The patient has 2 grandchildren.    ADVANCED DIRECTIVES: in place   HEALTH MAINTENANCE: Social History   Tobacco Use  . Smoking status: Former Smoker    Last attempt to quit: 02/25/2006    Years since quitting: 11.8  . Smokeless tobacco: Never Used  Substance Use Topics  . Alcohol use: Yes    Alcohol/week: 2.0 - 3.0 standard drinks    Types: 2 - 3 Glasses of wine per week    Comment: no liquor  . Drug use: No     Colonoscopy:  PAP:  Bone density: January 2015  Lipid panel:  Allergies  Allergen Reactions  . Darvon Hives and Itching  . Penicillins Hives and Itching    Has patient had a PCN reaction causing immediate rash, facial/tongue/throat swelling, SOB or lightheadedness with hypotension: No Has patient had a PCN reaction causing severe rash involving mucus membranes or skin necrosis: No Has patient had a PCN reaction that required hospitalization: No Has patient had a PCN reaction occurring within the last 10 years: No If all of the above answers are "NO", then may proceed with Cephalosporin use.     Current Outpatient Medications  Medication Sig Dispense Refill  . apixaban (ELIQUIS) 2.5 MG TABS tablet Take 1 tablet (2.5 mg total) by mouth 2 (two) times daily. 30 tablet 0  . Ascorbic Acid (VITAMIN C) 500 MG tablet Take 500 mg by mouth daily.      . Cholecalciferol (VITAMIN D PO) Take 1 tablet by mouth daily.    . Glucos-Chond-Hyal Ac-Ca Fructo (MOVE FREE JOINT HEALTH ADVANCE PO) Take 1 tablet by mouth daily.    Marland Kitchen KRILL OIL PO Take 1 capsule by mouth daily.     Marland Kitchen letrozole (FEMARA) 2.5 MG tablet TAKE 1 TABLET (2.5 MG TOTAL) BY MOUTH DAILY. 90 tablet 2  . magnesium oxide (MAG-OX) 400 MG tablet Take 400 mg by mouth daily.    . Menthol, Topical Analgesic, (BLUE-EMU MAXIMUM STRENGTH EX) Apply 1 application topically daily.    . Multiple Vitamin  (MULTIVITAMIN PO) Take 1 tablet by mouth daily.     Marland Kitchen oxyCODONE-acetaminophen (ROXICET) 5-325 MG tablet Take 1 tablet by mouth every 4 (four) hours as needed. 60 tablet 0  . PRESCRIPTION MEDICATION Take 1 tablet by mouth every evening. Domperidone 10 mg    . Probiotic Product (PROBIOTIC PO) Take 1 capsule by mouth daily.    . rosuvastatin (CRESTOR) 5 MG tablet Take 1 tablet (5 mg total) by mouth daily at 6 PM. (Patient taking differently: Take 5 mg by mouth daily. ) 30 tablet 6  . sitaGLIPtin (JANUVIA) 100 MG tablet Take 100 mg by mouth daily.    Marland Kitchen tiZANidine (ZANAFLEX) 2 MG tablet Take 1 tablet (  2 mg total) by mouth every 6 (six) hours as needed for muscle spasms. 60 tablet 0  . Turmeric (CURCUMIN 95 PO) Take 1 capsule by mouth daily.     No current facility-administered medications for this visit.     OBJECTIVE: Middle-aged [place] American woman in no acute distress Vitals:   12/29/17 1113  BP: (!) 141/77  Pulse: 83  Resp: 18  Temp: 97.8 F (36.6 C)  SpO2: 100%     Body mass index is 33.88 kg/m.    ECOG FS:1 - Symptomatic but completely ambulatory  Sclerae unicteric, pupils round and equal No cervical or supraclavicular adenopathy Lungs no rales or rhonchi Heart regular rate and rhythm Abd soft, nontender, positive bowel sounds MSK no focal spinal tenderness, no upper extremity lymphedema Neuro: nonfocal, well oriented, appropriate affect Breasts: Status post bilateral mastectomies with bilateral silicone implant in place.  There is no evidence of disease recurrence.  Both axillae are benign.    LAB RESULTS:  CMP     Component Value Date/Time   NA 140 02/25/2017 1344   NA 141 12/30/2016 1046   K 3.8 02/25/2017 1344   K 4.1 12/30/2016 1046   CL 102 02/25/2017 1344   CL 104 07/30/2012 0955   CO2 28 02/25/2017 1344   CO2 30 (H) 12/30/2016 1046   GLUCOSE 140 (H) 02/25/2017 1344   GLUCOSE 136 12/30/2016 1046   GLUCOSE 138 (H) 07/30/2012 0955   BUN 8 02/25/2017 1344    BUN 12.6 12/30/2016 1046   CREATININE 0.64 02/25/2017 1344   CREATININE 0.8 12/30/2016 1046   CALCIUM 10.0 02/25/2017 1344   CALCIUM 9.1 12/30/2016 1046   PROT 7.5 12/30/2016 1046   ALBUMIN 3.9 12/30/2016 1046   AST 20 12/30/2016 1046   ALT 14 12/30/2016 1046   ALKPHOS 102 12/30/2016 1046   BILITOT 0.40 12/30/2016 1046   GFRNONAA >60 02/25/2017 1344   GFRAA >60 02/25/2017 1344    I No results found for: SPEP  Lab Results  Component Value Date   WBC 4.8 12/29/2017   NEUTROABS 3.0 12/29/2017   HGB 12.4 12/29/2017   HCT 39.6 12/29/2017   MCV 83.7 12/29/2017   PLT 295 12/29/2017      Chemistry      Component Value Date/Time   NA 140 02/25/2017 1344   NA 141 12/30/2016 1046   K 3.8 02/25/2017 1344   K 4.1 12/30/2016 1046   CL 102 02/25/2017 1344   CL 104 07/30/2012 0955   CO2 28 02/25/2017 1344   CO2 30 (H) 12/30/2016 1046   BUN 8 02/25/2017 1344   BUN 12.6 12/30/2016 1046   CREATININE 0.64 02/25/2017 1344   CREATININE 0.8 12/30/2016 1046      Component Value Date/Time   CALCIUM 10.0 02/25/2017 1344   CALCIUM 9.1 12/30/2016 1046   ALKPHOS 102 12/30/2016 1046   AST 20 12/30/2016 1046   ALT 14 12/30/2016 1046   BILITOT 0.40 12/30/2016 1046       Lab Results  Component Value Date   LABCA2 9 05/10/2012    No components found for: [id number]  No results for input(s): INR in the last 168 hours.  Urinalysis    Component Value Date/Time   COLORURINE YELLOW 02/25/2017 1344   APPEARANCEUR CLEAR 02/25/2017 1344   LABSPEC <1.005 (L) 02/25/2017 1344   PHURINE 5.5 02/25/2017 1344   GLUCOSEU NEGATIVE 02/25/2017 1344   HGBUR NEGATIVE 02/25/2017 1344   BILIRUBINUR NEGATIVE 02/25/2017 1344   KETONESUR NEGATIVE  02/25/2017 1344   PROTEINUR NEGATIVE 02/25/2017 1344   UROBILINOGEN 0.2 05/10/2012 1444   NITRITE NEGATIVE 02/25/2017 1344   LEUKOCYTESUR TRACE (A) 02/25/2017 1344    STUDIES:  No results found.   ASSESSMENT: 67 y.o. BRCA negative [place] woman  status post bilateral mastectomies 05/14/2012  (a) on the left, mpT1c pN0, stage IA invasive ductal carcinoma, grade 2, estrogen receptor 100% positive, progesterone receptor 11% positive, with an MIB-1 of 80%, and HER-2 amplified, the ratio being 6.50.  (b) on the right ,pT1c pN0, stage IA invasive ductal carcinoma, grade 3, triple negative, with an MIB-1 of 92%   (1) treated adjuvantly with doxorubicin and cyclophosphamide in dose dense fashion x4, completed 08/13/2012, followed by weekly Abraxane x12 given with concurrent trastuzumab, completed 12/03/2012  (2) received trastuzumab between 08/27/2012 and 06/29/2013, discontinued because of concerns regarding weakening of the heart muscle  (a) echo 07/25/2013 showed an ejection fraction of 55-60%  (3) letrozole started November 2014; completed 5 years November 2019    (4) bone density 03/01/2013 showed osteopenia with a T score of - 1.8  (5) s/p bilateral implant reconstruction  (6) Genetic testing October 2014 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 There were no deleterious mutations in the genetic panel tested, which included ATM, BARD1, BRCA1, BRCA2, BRIP1, CDH1, CHEK2, EPCAM, FANCC, MLH1, MSH2, MSH6, NBN, PALB2, PMS2, PTEN, RAD51C, RAD51D, STK11, TP53, and XRCC2   PLAN: [name] is now 5-1/2 years out from definitive surgery for her breast cancer with no evidence of disease recurrence.  This is very favorable.  She has completed 5 years of anastrozole.  In node-negative cases like hers I do not recommend continuing to a total of 7 years as the absolute benefit in terms of distant disease risk reduction is in the 1% range or less.  Accordingly she is going off the medication now.  I am hoping in a couple of months she will feel like she has a little bit more energy.  Sometimes when she is worried about something she can note her heart rate go up to perhaps the mid 90s.  I have shown her how to check her  pulse to see if it is regular or irregular and if it is a regular she will call us or she will call her former cardiologist Dr. Haroldine Laws.  She had some skin lesions that she is worrying about and I am referring her to dermatology for further evaluation.  At this point I feel comfortable releasing her to her primary care physicians care.  All she will need in terms of breast cancer follow-up is a yearly physician chest wall exam.  I will be glad to see Iona Beard again at any point in the future if and when the need arises but as of now are making no further routine appointments for her here.   Iverson Sees, Virgie Dad, MD  12/29/17 11:28 AM Medical Oncology and Hematology [facility] [address] [place], [place] [address] Tel. [phone]    Fax. [phone]  I, Margit Banda am acting as a scribe for Chauncey Cruel, MD.   I, Lurline Del MD, have reviewed the above documentation for accuracy and completeness, and I agree with the above.

## 2017-12-29 ENCOUNTER — Inpatient Hospital Stay: Payer: Medicare Other | Attending: Oncology | Admitting: Oncology

## 2017-12-29 ENCOUNTER — Encounter: Payer: Self-pay | Admitting: Oncology

## 2017-12-29 ENCOUNTER — Inpatient Hospital Stay: Payer: Medicare Other

## 2017-12-29 ENCOUNTER — Telehealth: Payer: Self-pay | Admitting: Oncology

## 2017-12-29 ENCOUNTER — Telehealth: Payer: Self-pay | Admitting: Hematology and Oncology

## 2017-12-29 DIAGNOSIS — Z421 Encounter for breast reconstruction following mastectomy: Secondary | ICD-10-CM | POA: Insufficient documentation

## 2017-12-29 DIAGNOSIS — Z87891 Personal history of nicotine dependence: Secondary | ICD-10-CM | POA: Insufficient documentation

## 2017-12-29 DIAGNOSIS — C50911 Malignant neoplasm of unspecified site of right female breast: Secondary | ICD-10-CM | POA: Insufficient documentation

## 2017-12-29 DIAGNOSIS — C50912 Malignant neoplasm of unspecified site of left female breast: Secondary | ICD-10-CM

## 2017-12-29 DIAGNOSIS — Z79899 Other long term (current) drug therapy: Secondary | ICD-10-CM | POA: Diagnosis not present

## 2017-12-29 DIAGNOSIS — Z9221 Personal history of antineoplastic chemotherapy: Secondary | ICD-10-CM | POA: Diagnosis not present

## 2017-12-29 DIAGNOSIS — M8580 Other specified disorders of bone density and structure, unspecified site: Secondary | ICD-10-CM | POA: Insufficient documentation

## 2017-12-29 DIAGNOSIS — Z170 Estrogen receptor positive status [ER+]: Secondary | ICD-10-CM | POA: Insufficient documentation

## 2017-12-29 DIAGNOSIS — Z171 Estrogen receptor negative status [ER-]: Secondary | ICD-10-CM | POA: Diagnosis not present

## 2017-12-29 DIAGNOSIS — Z9013 Acquired absence of bilateral breasts and nipples: Secondary | ICD-10-CM | POA: Insufficient documentation

## 2017-12-29 DIAGNOSIS — C50812 Malignant neoplasm of overlapping sites of left female breast: Secondary | ICD-10-CM

## 2017-12-29 DIAGNOSIS — C50111 Malignant neoplasm of central portion of right female breast: Secondary | ICD-10-CM

## 2017-12-29 LAB — COMPREHENSIVE METABOLIC PANEL
ALT: 16 U/L (ref 0–44)
AST: 21 U/L (ref 15–41)
Albumin: 3.8 g/dL (ref 3.5–5.0)
Alkaline Phosphatase: 97 U/L (ref 38–126)
Anion gap: 8 (ref 5–15)
BUN: 17 mg/dL (ref 8–23)
CO2: 30 mmol/L (ref 22–32)
Calcium: 10.3 mg/dL (ref 8.9–10.3)
Chloride: 106 mmol/L (ref 98–111)
Creatinine, Ser: 0.74 mg/dL (ref 0.44–1.00)
GFR calc Af Amer: >60 mL/min (ref >60)
GFR calc non Af Amer: >60 mL/min (ref >60)
Glucose, Bld: 136 mg/dL — ABNORMAL HIGH (ref 70–99)
Potassium: 4.4 mmol/L (ref 3.5–5.1)
Sodium: 144 mmol/L (ref 135–145)
Total Bilirubin: 0.3 mg/dL (ref 0.3–1.2)
Total Protein: 7 g/dL (ref 6.5–8.1)

## 2017-12-29 LAB — CBC WITH DIFFERENTIAL/PLATELET
Abs Immature Granulocytes: 0.01 10*3/uL (ref 0.00–0.07)
Basophils Absolute: 0 10*3/uL (ref 0.0–0.1)
Basophils Relative: 0 %
Eosinophils Absolute: 0.1 10*3/uL (ref 0.0–0.5)
Eosinophils Relative: 2 %
HCT: 39.6 % (ref 36.0–46.0)
Hemoglobin: 12.4 g/dL (ref 12.0–15.0)
Immature Granulocytes: 0 %
Lymphocytes Relative: 27 %
Lymphs Abs: 1.3 10*3/uL (ref 0.7–4.0)
MCH: 26.2 pg (ref 26.0–34.0)
MCHC: 31.3 g/dL (ref 30.0–36.0)
MCV: 83.7 fL (ref 80.0–100.0)
Monocytes Absolute: 0.5 10*3/uL (ref 0.1–1.0)
Monocytes Relative: 10 %
Neutro Abs: 3 10*3/uL (ref 1.7–7.7)
Neutrophils Relative %: 61 %
Platelets: 295 10*3/uL (ref 150–400)
RBC: 4.73 MIL/uL (ref 3.87–5.11)
RDW: 14.8 % (ref 11.5–15.5)
WBC: 4.8 10*3/uL (ref 4.0–10.5)
nRBC: 0 % (ref 0.0–0.2)

## 2017-12-29 NOTE — Telephone Encounter (Signed)
No 11/12 los.

## 2017-12-29 NOTE — Telephone Encounter (Signed)
Referral for consultation sent.

## 2018-01-20 ENCOUNTER — Telehealth: Payer: Self-pay | Admitting: Internal Medicine

## 2018-01-20 NOTE — Telephone Encounter (Signed)
I left a message on the patient's home number explaining that her appointment on 03/03/18 has been changed from 10:30 from 9:15.  I then called her mobile and spoke with her and provided the information.  I did also explain that she will be seeing Audery Amel at this appointment. VDM (DD)

## 2018-03-03 ENCOUNTER — Encounter: Payer: Self-pay | Admitting: Internal Medicine

## 2018-03-03 ENCOUNTER — Ambulatory Visit (INDEPENDENT_AMBULATORY_CARE_PROVIDER_SITE_OTHER): Payer: Medicare Other | Admitting: Internal Medicine

## 2018-03-03 ENCOUNTER — Ambulatory Visit: Payer: Self-pay | Admitting: Internal Medicine

## 2018-03-03 ENCOUNTER — Ambulatory Visit (INDEPENDENT_AMBULATORY_CARE_PROVIDER_SITE_OTHER): Payer: Medicare Other

## 2018-03-03 VITALS — BP 142/78 | HR 74 | Temp 98.3°F | Ht 65.0 in | Wt 198.6 lb

## 2018-03-03 DIAGNOSIS — Z0000 Encounter for general adult medical examination without abnormal findings: Secondary | ICD-10-CM | POA: Diagnosis not present

## 2018-03-03 DIAGNOSIS — Z87891 Personal history of nicotine dependence: Secondary | ICD-10-CM

## 2018-03-03 DIAGNOSIS — I6523 Occlusion and stenosis of bilateral carotid arteries: Secondary | ICD-10-CM

## 2018-03-03 DIAGNOSIS — I359 Nonrheumatic aortic valve disorder, unspecified: Secondary | ICD-10-CM

## 2018-03-03 DIAGNOSIS — R0989 Other specified symptoms and signs involving the circulatory and respiratory systems: Secondary | ICD-10-CM

## 2018-03-03 DIAGNOSIS — Z8249 Family history of ischemic heart disease and other diseases of the circulatory system: Secondary | ICD-10-CM

## 2018-03-03 DIAGNOSIS — E119 Type 2 diabetes mellitus without complications: Secondary | ICD-10-CM

## 2018-03-03 DIAGNOSIS — E1165 Type 2 diabetes mellitus with hyperglycemia: Secondary | ICD-10-CM | POA: Diagnosis not present

## 2018-03-03 LAB — CMP14 + ANION GAP
ALT: 18 IU/L (ref 0–32)
AST: 19 IU/L (ref 0–40)
Albumin/Globulin Ratio: 1.8 (ref 1.2–2.2)
Albumin: 4.3 g/dL (ref 3.6–4.8)
Alkaline Phosphatase: 107 IU/L (ref 39–117)
Anion Gap: 21 mmol/L — ABNORMAL HIGH (ref 10.0–18.0)
BUN/Creatinine Ratio: 13 (ref 12–28)
BUN: 8 mg/dL (ref 8–27)
Bilirubin Total: 0.3 mg/dL (ref 0.0–1.2)
CO2: 26 mmol/L (ref 20–29)
Calcium: 10.1 mg/dL (ref 8.7–10.3)
Chloride: 100 mmol/L (ref 96–106)
Creatinine, Ser: 0.63 mg/dL (ref 0.57–1.00)
GFR calc Af Amer: 107 mL/min/{1.73_m2} (ref >59)
GFR calc non Af Amer: 93 mL/min/{1.73_m2} (ref >59)
Globulin, Total: 2.4 g/dL (ref 1.5–4.5)
Glucose: 173 mg/dL — ABNORMAL HIGH (ref 65–99)
Potassium: 4.6 mmol/L (ref 3.5–5.2)
Sodium: 147 mmol/L — ABNORMAL HIGH (ref 134–144)
Total Protein: 6.7 g/dL (ref 6.0–8.5)

## 2018-03-03 LAB — POCT UA - MICROALBUMIN
Creatinine, POC: 50 mg/dL
Microalbumin Ur, POC: 10 mg/L

## 2018-03-03 LAB — CBC
Hematocrit: 40.3 % (ref 34.0–46.6)
Hemoglobin: 13 g/dL (ref 11.1–15.9)
MCH: 26.5 pg — ABNORMAL LOW (ref 26.6–33.0)
MCHC: 32.3 g/dL (ref 31.5–35.7)
MCV: 82 fL (ref 79–97)
Platelets: 311 10*3/uL (ref 150–450)
RBC: 4.91 x10E6/uL (ref 3.77–5.28)
RDW: 14.1 % (ref 11.7–15.4)
WBC: 4.8 10*3/uL (ref 3.4–10.8)

## 2018-03-03 LAB — POCT URINALYSIS DIPSTICK
Bilirubin, UA: NEGATIVE
Blood, UA: NEGATIVE
Glucose, UA: NEGATIVE
Ketones, UA: NEGATIVE
Nitrite, UA: NEGATIVE
Protein, UA: NEGATIVE
Spec Grav, UA: <=1.005 — AB (ref 1.010–1.025)
Urobilinogen, UA: 0.2 E.U./dL
pH, UA: 6.5 (ref 5.0–8.0)

## 2018-03-03 LAB — LIPID PANEL
Chol/HDL Ratio: 2.3 ratio (ref 0.0–4.4)
Cholesterol, Total: 176 mg/dL (ref 100–199)
HDL: 77 mg/dL (ref >39)
LDL Calculated: 84 mg/dL (ref 0–99)
Triglycerides: 75 mg/dL (ref 0–149)
VLDL Cholesterol Cal: 15 mg/dL (ref 5–40)

## 2018-03-03 LAB — HEMOGLOBIN A1C
Est. average glucose Bld gHb Est-mCnc: 151 mg/dL
Hgb A1c MFr Bld: 6.9 % — ABNORMAL HIGH (ref 4.8–5.6)

## 2018-03-03 NOTE — Addendum Note (Signed)
Addended by: Glenna Durand E on: 03/03/2018 11:43 AM   Modules accepted: Orders

## 2018-03-03 NOTE — Progress Notes (Signed)
Subjective:   [name] [name] is a 68 y.o. female who presents for Medicare Annual (Subsequent) preventive examination.  Review of Systems:  n/a Cardiac Risk Factors include: advanced age (>60men, >28 women);diabetes mellitus;obesity (BMI >30kg/m2)     Objective:     Vitals: BP (!) 142/78 (BP Location: Right Arm, Patient Position: Sitting)   Pulse 74   Temp 98.3 F (36.8 C) (Oral)   Ht 5\' 5"  (1.651 m)   Wt 198 lb 9.6 oz (90.1 kg)   SpO2 99%   BMI 33.05 kg/m   Body mass index is 33.05 kg/m.  Advanced Directives 03/03/2018 03/02/2017 02/25/2017 05/15/2016 01/01/2016 07/25/2015 01/18/2014  Does Patient Have a Medical Advance Directive? Yes Yes Yes Yes Yes Yes Yes  Type of Paramedic of [place];Living will Living will;Healthcare Power of Attorney - Living will;Healthcare Power of Attorney - - -  Does patient want to make changes to medical advance directive? No - Patient declined No - Patient declined No - Patient declined - - - -  Copy of [place] in Chart? No - copy requested No - copy requested - - - - -  Pre-existing out of facility DNR order (yellow form or pink MOST form) - - - - - - -    Tobacco Social History   Tobacco Use  Smoking Status Former Smoker  . Last attempt to quit: 02/25/2006  . Years since quitting: 12.0  Smokeless Tobacco Never Used     Counseling given: Not Answered   Clinical Intake:  Pre-visit preparation completed: Yes  Pain : No/denies pain Pain Score: 0-No pain     Nutritional Status: BMI > 30  Obese Nutritional Risks: Nausea/ vomitting/ diarrhea(has GERD and gastroparesis) Diabetes: Yes CBG done?: No Did pt. bring in CBG monitor from home?: No  How often do you need to have someone help you when you read instructions, pamphlets, or other written materials from your doctor or pharmacy?: 1 - Never What is the last grade level you completed in school?: Masters Degree  Interpreter Needed?:  No  Information entered by :: NAllen LPN  Past Medical History:  Diagnosis Date  . Arthritis    back & hip- R  . Breast cancer ([place]) 04/15/12   left-biopsy  . Breast cancer, right breast ([facility]) 05/14/12   right mastectomy  . Bronchitis   . Cancer ([place])    breast  . 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 CHF with unknown LVEF ([facility]) 03/09/2013  . Diabetes mellitus   . Diabetes mellitus without complication ([place])   . GERD (gastroesophageal reflux disease)    pt. experiencing on occasional , h/o og gastroparesis   . Heart murmur   . Hiatal hernia   . Osteopenia   . Pneumonia    treated as an outpt.   . Sinus problem   . Sleep apnea    sleep minimal concern- " along time ago"   Past Surgical History:  Procedure Laterality Date  . AXILLARY SENTINEL NODE BIOPSY Left 05/14/2012   Procedure: AXILLARY SENTINEL NODE BIOPSY;  Surgeon: Adin Hector, MD;  Location: [facility];  Service: General;  Laterality: Left;  . BREAST RECONSTRUCTION WITH PLACEMENT OF TISSUE EXPANDER AND FLEX HD (ACELLULAR HYDRATED DERMIS) Bilateral 05/14/2012   Procedure: BREAST RECONSTRUCTION WITH PLACEMENT OF TISSUE EXPANDER AND FLEX HD (ACELLULAR HYDRATED DERMIS);  Surgeon: Crissie Reese, MD;  Location: [facility];  Service: Plastics;  Laterality: Bilateral;  . BUNIONECTOMY Bilateral   . CESAREAN  [place]  . FOOT SURGERY    . GASTRIC BYPASS     1st surgery- at [facility], then an emergent surgery here  [facility]- for perforation of stomach & then a surgery for debridement   . PORT-A-CATH REMOVAL Right 09/12/2013   Procedure: REMOVAL PORT-A-CATH;  Surgeon: Adin Hector, MD;  Location: [facility];  Service: General;  Laterality: Right;  . PORTACATH PLACEMENT N/A 05/14/2012   Procedure: INSERTION PORT-A-CATH;  Surgeon: Adin Hector, MD;  Location: [facility];  Service: General;  Laterality: N/A;  . TONSILLECTOMY    . TOTAL HIP ARTHROPLASTY Right 03/02/2017  . TOTAL HIP ARTHROPLASTY Right  03/02/2017   Procedure: RIGHT TOTAL HIP ARTHROPLASTY ANTERIOR APPROACH;  Surgeon: Frederik Pear, MD;  Location: [facility];  Service: Orthopedics;  Laterality: Right;  . TOTAL MASTECTOMY Bilateral 05/14/2012   Procedure: TOTAL MASTECTOMY;  Surgeon: Adin Hector, MD;  Location: [facility];  Service: General;  Laterality: Bilateral;  . tummy tuck  1999  . WOUND DEBRIDEMENT  01/14/2011   Procedure: DEBRIDEMENT ABDOMINAL WOUND;  Surgeon: Adin Hector, MD;  Location: [facility];  Service: General;  Laterality: N/A;  wound debridement and debridement on the abdomen   Family History  Problem Relation Age of Onset  . Heart attack Father   . Diabetes Father   . Heart attack Brother   . Diabetes Brother   . Diabetes Sister   . Breast cancer Sister 85  . Diabetes Mother   . Breast cancer Mother 68  . Parkinson's disease Sister   . Heart attack Brother    Social History   Socioeconomic History  . Marital status: Married    Spouse name: Joe  . Number of children: 2  . Years of education: 77  . Highest education level: Not on file  Occupational History  . 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 Occupation: retired  Scientific laboratory technician  . Financial resource strain: Not hard at all  . Food insecurity:    Worry: Never true    Inability: Never true  . Transportation needs:    Medical: No    Non-medical: No  Tobacco Use  . Smoking status: Former Smoker    Last attempt to quit: 02/25/2006    Years since quitting: 12.0  . Smokeless tobacco: Never Used  Substance and Sexual Activity  . Alcohol use: Yes    Alcohol/week: 2.0 - 3.0 standard drinks    Types: 2 - 3 Glasses of wine per week    Comment: no liquor  . Drug use: No  . Sexual activity: Yes    Partners: Male    Birth control/protection: Post-menopausal  Lifestyle  . Physical activity:    Days per week: 3 days    Minutes per session: 90 min  . Stress: Not at all  Relationships  . Social  connections:    Talks on phone: Not on file    Gets together: Not on file    Attends religious service: Not on file    Active member of club or organization: Not on file    Attends meetings of clubs or organizations: Not on file    Relationship status: Not on file  Other Topics Concern  . Not on file  Social History Narrative  . Not on file    Outpatient Encounter Medications as of 03/03/2018  Medication Sig  . Ascorbic Acid (VITAMIN C) 500 MG tablet Take  500 mg by mouth daily.    . Cholecalciferol (VITAMIN D PO) Take 1 tablet by mouth daily.  Marland Kitchen Dexlansoprazole (DEXILANT) 30 MG capsule Take 30 mg by mouth daily.  Marland Kitchen KRILL OIL PO Take 1 capsule by mouth daily.   . Menthol, Topical Analgesic, (BLUE-EMU MAXIMUM STRENGTH EX) Apply 1 application topically daily.  . Multiple Vitamin (MULTIVITAMIN PO) Take 1 tablet by mouth daily.   . rosuvastatin (CRESTOR) 5 MG tablet Take 1 tablet (5 mg total) by mouth daily at 6 PM. (Patient taking differently: Take 5 mg by mouth daily. )  . sitaGLIPtin (JANUVIA) 100 MG tablet Take 100 mg by mouth daily.  Marland Kitchen tiZANidine (ZANAFLEX) 2 MG tablet Take 1 tablet (2 mg total) by mouth every 6 (six) hours as needed for muscle spasms.  Marland Kitchen apixaban (ELIQUIS) 2.5 MG TABS tablet Take 1 tablet (2.5 mg total) by mouth 2 (two) times daily. (Patient not taking: Reported on 03/03/2018)  . Glucos-Chond-Hyal Ac-Ca Fructo (MOVE FREE JOINT HEALTH ADVANCE PO) Take 1 tablet by mouth daily.  Marland Kitchen letrozole (FEMARA) 2.5 MG tablet TAKE 1 TABLET (2.5 MG TOTAL) BY MOUTH DAILY. (Patient not taking: Reported on 03/03/2018)  . magnesium oxide (MAG-OX) 400 MG tablet Take 400 mg by mouth daily.  Marland Kitchen oxyCODONE-acetaminophen (ROXICET) 5-325 MG tablet Take 1 tablet by mouth every 4 (four) hours as needed. (Patient not taking: Reported on 03/03/2018)  . PRESCRIPTION MEDICATION Take 1 tablet by mouth every evening. Domperidone 10 mg  . Probiotic Product (PROBIOTIC PO) Take 1 capsule by mouth daily.  .  Turmeric (CURCUMIN 95 PO) Take 1 capsule by mouth daily.   No facility-administered encounter medications on file as of 03/03/2018.     Activities of Daily Living In your present state of health, do you have any difficulty performing the following activities: 03/03/2018  Hearing? N  Vision? N  Difficulty concentrating or making decisions? Y  Comment forgetfulness  Walking or climbing stairs? N  Dressing or bathing? N  Doing errands, shopping? N  Preparing Food and eating ? N  Using the Toilet? N  In the past six months, have you accidently leaked urine? N  Do you have problems with loss of bowel control? N  Managing your Medications? N  Managing your Finances? N  Housekeeping or managing your Housekeeping? N  Some recent data might be hidden    Patient Care Team: [name], MD as PCP - General (Internal Medicine) Crawford Givens, MD as Consulting Physician (Obstetrics and Gynecology) Fanny Skates, MD as Consulting Physician (General Surgery) Crissie Reese, MD as Consulting Physician (Plastic Surgery) Belva Crome, MD as Consulting Physician (Cardiology) Marylynn Pearson, MD as Consulting Physician (Ophthalmology)    Assessment:   This is a routine wellness examination for [name].  Exercise Activities and Dietary recommendations Current Exercise Habits: Home exercise routine, Type of exercise: treadmill;Other - see comments(stationary bike), Time (Minutes): > 60, Frequency (Times/Week): 3, Weekly Exercise (Minutes/Week): 0, Intensity: Moderate, Exercise limited by: None identified  Goals    . Exercise 3x per week (30 min per time)     Wants to exercise 5-6 days per week       Fall Risk Fall Risk  03/03/2018  Falls in the past year? 0  Risk for fall due to : Medication side effect  Follow up Education provided;Falls prevention discussed   Is the patient's home free of loose throw rugs in walkways, pet beds, electrical cords, etc?   yes      Grab  bars in the  bathroom? no      Handrails on the stairs?   yes      Adequate lighting?   yes  Timed Get Up and Go performed: n/a  Depression Screen PHQ 2/9 Scores 03/03/2018  PHQ - 2 Score 0  PHQ- 9 Score 0     Cognitive Function     6CIT Screen 03/03/2018  What Year? 0 points  What month? 0 points  What time? 0 points  Count back from 20 0 points  Months in reverse 0 points  Repeat phrase 0 points  Total Score 0    Immunization History  Administered Date(s) Administered  . Influenza,inj,Quad PF,6+ Mos 11/05/2012  . Pneumococcal Polysaccharide-23 03/03/2017  . Zoster Recombinat (Shingrix) 07/01/2017    Qualifies for Shingles Vaccine?   Screening Tests Health Maintenance  Topic Date Due  . Hepatitis C Screening  1950/11/18  . FOOT EXAM  03/31/1960  . OPHTHALMOLOGY EXAM  03/31/1960  . URINE MICROALBUMIN  03/31/1960  . HEMOGLOBIN A1C  08/30/2017  . MAMMOGRAM  03/04/2019 (Originally 03/29/2014)  . PNA vac Low Risk Adult (2 of 2 - PCV13) 03/04/2019 (Originally 03/03/2018)  . TETANUS/TDAP  02/03/2021  . COLONOSCOPY  04/29/2022  . INFLUENZA VACCINE  Completed  . DEXA SCAN  Completed    Cancer Screenings: Lung: Low Dose CT Chest recommended if Age 23-80 years, 30 pack-year currently smoking OR have quit w/in 15years. Patient does not qualify. Breast:  Up to date on Mammogram? Yes   Up to date of Bone Density/Dexa? Yes Colorectal: up to date  Additional Screenings: : Hepatitis C Screening:  due     Plan:    Declined prevnar 13. Wants to increase exercise to 5-6 days weekly.   I have personally reviewed and noted the following in the patient's chart:   . Medical and social history . Use of alcohol, tobacco or illicit drugs  . Current medications and supplements . Functional ability and status . Nutritional status . Physical activity . Advanced directives . List of other physicians . Hospitalizations, surgeries, and ER visits in previous 12 months . Vitals . Screenings  to include cognitive, depression, and falls . Referrals and appointments  In addition, I have reviewed and discussed with patient certain preventive protocols, quality metrics, and best practice recommendations. A written personalized care plan for preventive services as well as general preventive health recommendations were provided to patient.     Kellie Simmering, LPN  [id number]

## 2018-03-03 NOTE — Patient Instructions (Signed)
[name] [name] , Thank you for taking time to come for your Medicare Wellness Visit. I appreciate your ongoing commitment to your health goals. Please review the following plan we discussed and let me know if I can assist you in the future.   Screening recommendations/referrals: Colonoscopy: 04/2012  Mammogram: n/a Bone Density: 02/2013 Recommended yearly ophthalmology/optometry visit for glaucoma screening and checkup Recommended yearly dental visit for hygiene and checkup  Vaccinations: Influenza vaccine: 10/2017 Pneumococcal vaccine: declined Tdap vaccine: 01/2011 Shingles vaccine: 2019 both doses    Advanced directives: Please bring a copy of your POA (Power of [place]) and/or Living Will to your next appointment.    Conditions/risks identified: Obesity  Next appointment: 06/10/2018 at 3:30p   Preventive Care 65 Years and Older, Female Preventive care refers to lifestyle choices and visits with your health care provider that can promote health and wellness. What does preventive care include?  A yearly physical exam. This is also called an annual well check.  Dental exams once or twice a year.  Routine eye exams. Ask your health care provider how often you should have your eyes checked.  Personal lifestyle choices, including:  Daily care of your teeth and gums.  Regular physical activity.  Eating a healthy diet.  Avoiding tobacco and drug use.  Limiting alcohol use.  Practicing safe sex.  Taking low-dose aspirin every day.  Taking vitamin and mineral supplements as recommended by your health care provider. What happens during an annual well check? The services and screenings done by your health care provider during your annual well check will depend on your age, overall health, lifestyle risk factors, and family history of disease. Counseling  Your health care provider may ask you questions about your:  Alcohol use.  Tobacco use.  Drug use.  Emotional  well-being.  Home and relationship well-being.  Sexual activity.  Eating habits.  History of falls.  Memory and ability to understand (cognition).  Work and work Statistician.  Reproductive health. Screening  You may have the following tests or measurements:  Height, weight, and BMI.  Blood pressure.  Lipid and cholesterol levels. These may be checked every 5 years, or more frequently if you are over 62 years old.  Skin check.  Lung cancer screening. You may have this screening every year starting at age 9 if you have a 30-pack-year history of smoking and currently smoke or have quit within the past 15 years.  Fecal occult blood test (FOBT) of the stool. You may have this test every year starting at age 79.  Flexible sigmoidoscopy or colonoscopy. You may have a sigmoidoscopy every 5 years or a colonoscopy every 10 years starting at age 7.  Hepatitis C blood test.  Hepatitis B blood test.  Sexually transmitted disease (STD) testing.  Diabetes screening. This is done by checking your blood sugar (glucose) after you have not eaten for a while (fasting). You may have this done every 1-3 years.  Bone density scan. This is done to screen for osteoporosis. You may have this done starting at age 29.  Mammogram. This may be done every 1-2 years. Talk to your health care provider about how often you should have regular mammograms. Talk with your health care provider about your test results, treatment options, and if necessary, the need for more tests. Vaccines  Your health care provider may recommend certain vaccines, such as:  Influenza vaccine. This is recommended every year.  Tetanus, diphtheria, and acellular pertussis (Tdap, Td) vaccine. You may need  a Td booster every 10 years.  Zoster vaccine. You may need this after age 75.  Pneumococcal 13-valent conjugate (PCV13) vaccine. One dose is recommended after age 5.  Pneumococcal polysaccharide (PPSV23) vaccine. One  dose is recommended after age 80. Talk to your health care provider about which screenings and vaccines you need and how often you need them. This information is not intended to replace advice given to you by your health care provider. Make sure you discuss any questions you have with your health care provider. Document Released: 03/02/2015 Document Revised: 10/24/2015 Document Reviewed: 12/05/2014 Elsevier Interactive Patient Education  2017 [place] Prevention in the Home Falls can cause injuries. They can happen to people of all ages. There are many things you can do to make your home safe and to help prevent falls. What can I do on the outside of my home?  Regularly fix the edges of walkways and driveways and fix any cracks.  Remove anything that might make you trip as you walk through a door, such as a raised step or threshold.  Trim any bushes or trees on the path to your home.  Use bright outdoor lighting.  Clear any walking paths of anything that might make someone trip, such as rocks or tools.  Regularly check to see if handrails are loose or broken. Make sure that both sides of any steps have handrails.  Any raised decks and porches should have guardrails on the edges.  Have any leaves, snow, or ice cleared regularly.  Use sand or salt on walking paths during winter.  Clean up any spills in your garage right away. This includes oil or grease spills. What can I do in the bathroom?  Use night lights.  Install grab bars by the toilet and in the tub and shower. Do not use towel bars as grab bars.  Use non-skid mats or decals in the tub or shower.  If you need to sit down in the shower, use a plastic, non-slip stool.  Keep the floor dry. Clean up any water that spills on the floor as soon as it happens.  Remove soap buildup in the tub or shower regularly.  Attach bath mats securely with double-sided non-slip rug tape.  Do not have throw rugs and other  things on the floor that can make you trip. What can I do in the bedroom?  Use night lights.  Make sure that you have a light by your bed that is easy to reach.  Do not use any sheets or blankets that are too big for your bed. They should not hang down onto the floor.  Have a firm chair that has side arms. You can use this for support while you get dressed.  Do not have throw rugs and other things on the floor that can make you trip. What can I do in the kitchen?  Clean up any spills right away.  Avoid walking on wet floors.  Keep items that you use a lot in easy-to-reach places.  If you need to reach something above you, use a strong step stool that has a grab bar.  Keep electrical cords out of the way.  Do not use floor polish or wax that makes floors slippery. If you must use wax, use non-skid floor wax.  Do not have throw rugs and other things on the floor that can make you trip. What can I do with my stairs?  Do not leave any items on the  stairs.  Make sure that there are handrails on both sides of the stairs and use them. Fix handrails that are broken or loose. Make sure that handrails are as long as the stairways.  Check any carpeting to make sure that it is firmly attached to the stairs. Fix any carpet that is loose or worn.  Avoid having throw rugs at the top or bottom of the stairs. If you do have throw rugs, attach them to the floor with carpet tape.  Make sure that you have a light switch at the top of the stairs and the bottom of the stairs. If you do not have them, ask someone to add them for you. What else can I do to help prevent falls?  Wear shoes that:  Do not have high heels.  Have rubber bottoms.  Are comfortable and fit you well.  Are closed at the toe. Do not wear sandals.  If you use a stepladder:  Make sure that it is fully opened. Do not climb a closed stepladder.  Make sure that both sides of the stepladder are locked into place.  Ask  someone to hold it for you, if possible.  Clearly mark and make sure that you can see:  Any grab bars or handrails.  First and last steps.  Where the edge of each step is.  Use tools that help you move around (mobility aids) if they are needed. These include:  Canes.  Walkers.  Scooters.  Crutches.  Turn on the lights when you go into a dark area. Replace any light bulbs as soon as they burn out.  Set up your furniture so you have a clear path. Avoid moving your furniture around.  If any of your floors are uneven, fix them.  If there are any pets around you, be aware of where they are.  Review your medicines with your doctor. Some medicines can make you feel dizzy. This can increase your chance of falling. Ask your doctor what other things that you can do to help prevent falls. This information is not intended to replace advice given to you by your health care provider. Make sure you discuss any questions you have with your health care provider. Document Released: 11/30/2008 Document Revised: 07/12/2015 Document Reviewed: 03/10/2014 Elsevier Interactive Patient Education  2017 [employer].

## 2018-03-03 NOTE — Progress Notes (Addendum)
Subjective:     Patient ID: [name] [name] , female    DOB: 11-14-1950 , 68 y.o.   MRN: [id number]   CC DM FU  HPI Pt is here for FU DM.  Fasting glucose on average around 120. Her orthopedist advised her to get carotid and aorta ultrasound since he saw some changes with xray during her hip surgery.   Past Medical History:  Diagnosis Date  . Arthritis    back & hip- R  . Breast cancer ([place]) 04/15/12   left-biopsy  . Breast cancer, right breast ([place]) 05/14/12   right mastectomy  . Bronchitis   . Cancer ([place])    breast  . 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 CHF with unknown LVEF ([place]) 03/09/2013  . Diabetes mellitus   . Diabetes mellitus without complication ([place])   . GERD (gastroesophageal reflux disease)    pt. experiencing on occasional , h/o og gastroparesis   . Heart murmur   . Hiatal hernia   . Osteopenia   . Pneumonia    treated as an outpt.   . Sinus problem   . Sleep apnea    sleep minimal concern- " along time ago"     Family History  Problem Relation Age of Onset  . Heart attack Father   . Diabetes Father   . Heart attack Brother   . Diabetes Brother   . Diabetes Sister   . Breast cancer Sister 85  . Diabetes Mother   . Breast cancer Mother 70  . Parkinson's disease Sister   . Heart attack Brother      Current Outpatient Medications:  .  apixaban (ELIQUIS) 2.5 MG TABS tablet, Take 1 tablet (2.5 mg total) by mouth 2 (two) times daily. (Patient not taking: Reported on 03/03/2018), Disp: 30 tablet, Rfl: 0 .  Ascorbic Acid (VITAMIN C) 500 MG tablet, Take 500 mg by mouth daily.  , Disp: , Rfl:  .  Cholecalciferol (VITAMIN D PO), Take 1 tablet by mouth daily., Disp: , Rfl:  .  Dexlansoprazole (DEXILANT) 30 MG capsule, Take 30 mg by mouth daily., Disp: , Rfl:  .  Glucos-Chond-Hyal Ac-Ca Fructo (MOVE FREE JOINT HEALTH ADVANCE PO), Take 1 tablet by mouth daily., Disp: , Rfl:  .  KRILL OIL PO, Take 1 capsule by mouth daily. , Disp: , Rfl:  .   letrozole (FEMARA) 2.5 MG tablet, TAKE 1 TABLET (2.5 MG TOTAL) BY MOUTH DAILY. (Patient not taking: Reported on 03/03/2018), Disp: 90 tablet, Rfl: 2 .  magnesium oxide (MAG-OX) 400 MG tablet, Take 400 mg by mouth daily., Disp: , Rfl:  .  Menthol, Topical Analgesic, (BLUE-EMU MAXIMUM STRENGTH EX), Apply 1 application topically daily., Disp: , Rfl:  .  Multiple Vitamin (MULTIVITAMIN PO), Take 1 tablet by mouth daily. , Disp: , Rfl:  .  oxyCODONE-acetaminophen (ROXICET) 5-325 MG tablet, Take 1 tablet by mouth every 4 (four) hours as needed. (Patient not taking: Reported on 03/03/2018), Disp: 60 tablet, Rfl: 0 .  PRESCRIPTION MEDICATION, Take 1 tablet by mouth every evening. Domperidone 10 mg, Disp: , Rfl:  .  Probiotic Product (PROBIOTIC PO), Take 1 capsule by mouth daily., Disp: , Rfl:  .  rosuvastatin (CRESTOR) 5 MG tablet, Take 1 tablet (5 mg total) by mouth daily at 6 PM. (Patient taking differently: Take 5 mg by mouth daily. ), Disp: 30 tablet, Rfl: 6 .  sitaGLIPtin (JANUVIA) 100 MG tablet, Take 100 mg by mouth daily., Disp: , Rfl:  .  tiZANidine (ZANAFLEX) 2 MG tablet, Take 1 tablet (2 mg total) by mouth every 6 (six) hours as needed for muscle spasms., Disp: 60 tablet, Rfl: 0 .  Turmeric (CURCUMIN 95 PO), Take 1 capsule by mouth daily., Disp: , Rfl:    Allergies  Allergen Reactions  . Darvon Hives and Itching  . Penicillins Hives and Itching    Has patient had a PCN reaction causing immediate rash, facial/tongue/throat swelling, SOB or lightheadedness with hypotension: No Has patient had a PCN reaction causing severe rash involving mucus membranes or skin necrosis: No Has patient had a PCN reaction that required hospitalization: No Has patient had a PCN reaction occurring within the last 10 years: No If all of the above answers are "NO", then may proceed with Cephalosporin use.     Review of Systems  Constitutional: Negative for appetite change and diaphoresis.  Eyes: Negative for visual  disturbance.  Respiratory: Negative for chest tightness and shortness of breath.   Cardiovascular: Positive for palpitations. Negative for chest pain.       On occasion gets palpitations  Endocrine: Negative for polydipsia, polyphagia and polyuria.  Genitourinary: Negative for difficulty urinating, dysuria and frequency.  Musculoskeletal:       On occasion gets L neck pain and lasts for few days  To few hours and is located on the L sternocleidomastoid region, but is not painful today.   Skin: Negative for rash and wound.  Neurological: Negative for headaches.  Hematological: Negative for adenopathy.    Today's Vitals   03/03/18 1034  BP: (!) 142/78  Pulse: 74  Temp: 98.3 F (36.8 C)  TempSrc: Oral  SpO2: 99%  Weight: 198 lb 9.6 oz (90.1 kg)  Height: 5\' 5"  (1.651 m)   Body mass index is 33.05 kg/m.  Objective:  Physical Exam   Constitutional: She is oriented to person, place, and time. She appears well-developed and well-nourished. No distress.  HENT:  Head: Normocephalic and atraumatic.  Right Ear: External ear normal.  Left Ear: External ear normal.  Nose: Nose normal.  Eyes: Conjunctivae are normal. Right eye exhibits no discharge. Left eye exhibits no discharge. No scleral icterus.  Neck: Neck supple. No thyromegaly present.  No carotid on L and there is one on R Cardiovascular: Normal rate and regular rhythm.  1-2/6 murmur heard. Pulmonary/Chest: Effort normal and breath sounds normal. No respiratory distress.  Musculoskeletal: Normal range of motion. She exhibits no edema.  Lymphadenopathy:    She has no cervical adenopathy.  Neurological: She is alert and oriented to person, place, and time.  Skin: Skin is warm and dry. Capillary refill takes less than 2 seconds. No rash noted. She is not diaphoretic.  Psychiatric: She has a normal mood and affect. Her behavior is normal. Judgment and thought content normal.  Nursing note reviewed.     Assessment And Plan:      1. Aortic annular calcification- per orthopedist report to pt. - US Carotid Duplex Bilateral; Future - US ABDOMINAL AORTA SCREENING AAA; Future  2. Right carotid bruit- acute - Lipid Profile  3. Carotid artery calcification, bilateral- mentioned per her orthopedist.   4. Former smoker- smoked for 20y in the past.  - US ABDOMINAL AORTA SCREENING AAA; Future - Lipid Profile  5. Uncontrolled type 2 diabetes mellitus with hyperglycemia ([place])- chronic. Will stay on same meds unless her labs are abnormal.  - Hemoglobin A1c - CMP14 + Anion Gap - CBC no Diff - Lipid Profile  I reviewed her  orthopedist notes, and I did not find any notation about his findings of calcifications. Since pt was a smoker and CAD runs in her family, I ordered the recommended test. I explained to pt, if the diagnosis is not supported her insurance may not cover it, so I asked her to check with the facility if it was covered. Pt said regardless if it is or not, she feels she should have the test recommended by her orthopedist.  Shelby Mattocks, PA-C

## 2018-03-09 ENCOUNTER — Other Ambulatory Visit (HOSPITAL_COMMUNITY): Payer: Self-pay | Admitting: Internal Medicine

## 2018-03-09 NOTE — Progress Notes (Unsigned)
Please call pt and inform her that her DM is stable, A!C 6.1, was 7 in September. Normal is 5.6 or less. Her chemistry shows slightly sodium, so needs to decrease sodium intake, but rest is normal, as as cell count and lipid panel.

## 2018-03-10 NOTE — Progress Notes (Signed)
Patient notified YRL,RMA 

## 2018-03-15 DIAGNOSIS — D229 Melanocytic nevi, unspecified: Secondary | ICD-10-CM | POA: Diagnosis not present

## 2018-03-15 DIAGNOSIS — L821 Other seborrheic keratosis: Secondary | ICD-10-CM | POA: Diagnosis not present

## 2018-04-16 ENCOUNTER — Ambulatory Visit
Admission: RE | Admit: 2018-04-16 | Discharge: 2018-04-16 | Disposition: A | Discharge status: Home / Self Care | Payer: Medicare Other | Source: Ambulatory Visit | Attending: Internal Medicine | Admitting: Internal Medicine

## 2018-04-16 DIAGNOSIS — I700 Atherosclerosis of aorta: Secondary | ICD-10-CM | POA: Diagnosis not present

## 2018-04-16 DIAGNOSIS — I359 Nonrheumatic aortic valve disorder, unspecified: Secondary | ICD-10-CM

## 2018-04-16 DIAGNOSIS — I6521 Occlusion and stenosis of right carotid artery: Secondary | ICD-10-CM | POA: Diagnosis not present

## 2018-04-16 DIAGNOSIS — Z87891 Personal history of nicotine dependence: Secondary | ICD-10-CM

## 2018-05-12 ENCOUNTER — Other Ambulatory Visit: Payer: Self-pay

## 2018-05-12 MED ORDER — CRESTOR 5 MG PO TABS: 5.0000 mg | ORAL_TABLET | Freq: Every day | ORAL | 1 refills | Status: DC

## 2018-05-13 ENCOUNTER — Other Ambulatory Visit: Payer: Self-pay

## 2018-06-02 ENCOUNTER — Telehealth: Payer: Self-pay | Admitting: Internal Medicine

## 2018-06-02 NOTE — Telephone Encounter (Signed)
PLS CONTACT PT IN REGARDS TO HER APPT SHE DECLINED VIRTUAL VISIT

## 2018-06-02 NOTE — Telephone Encounter (Signed)
I contacted pt to convert her 4/23 appt from in-person to virtual. She declined. She reports that she does not wish to come in for appt. I explained that a virtual visit is done virtually - by phone or video.  She states she feels fine and does not think this is needed. Feels appts should be left available for those who need them. I reiterated importance of regular f/u for her chronic illnesses - dm/htn. She declines. An appt will be needed prior to her receiving more refills of her meds.

## 2018-06-10 ENCOUNTER — Ambulatory Visit: Payer: Medicare Other | Admitting: Internal Medicine

## 2018-08-06 ENCOUNTER — Other Ambulatory Visit: Payer: Self-pay | Admitting: Internal Medicine

## 2018-08-06 ENCOUNTER — Telehealth: Payer: Self-pay

## 2018-08-06 NOTE — Telephone Encounter (Signed)
Called pt to schedule appt but pt isn't willing to come in nor have a virtual appt. States she doesn't see a point in it and that she is terrified of the coronavirus. I explained to her the precautions we are taking in our office. I also explained the benefits of the virtual visit and [place] being able to come out to the car if that was better for her. Pt still denied.

## 2018-08-08 ENCOUNTER — Other Ambulatory Visit: Payer: Self-pay | Admitting: Internal Medicine

## 2018-08-19 ENCOUNTER — Other Ambulatory Visit: Payer: Self-pay | Admitting: Internal Medicine

## 2018-08-19 DIAGNOSIS — E119 Type 2 diabetes mellitus without complications: Secondary | ICD-10-CM

## 2018-09-10 ENCOUNTER — Telehealth: Payer: Self-pay

## 2018-09-10 NOTE — Telephone Encounter (Signed)
PA SENT TO PLAN FOR CRESTOR

## 2018-10-06 DIAGNOSIS — Z23 Encounter for immunization: Secondary | ICD-10-CM | POA: Diagnosis not present

## 2018-11-10 ENCOUNTER — Telehealth: Payer: Self-pay | Admitting: Internal Medicine

## 2018-11-10 NOTE — Chronic Care Management (AMB) (Signed)
°  Chronic Care Management   Outreach Note  11/10/2018 Name: [name] [name] MRN: [id number] DOB: [birth year]  Referred by: [name], MD Reason for referral : Chronic Care Management (Initial CCM outreach was unsuccessful. )   An unsuccessful telephone outreach was attempted today. The patient was referred to the case management team by for assistance with chronic care management and care coordination.   Follow Up Plan: A HIPPA compliant phone message was left for the patient providing contact information and requesting a return call.  The care management team will reach out to the patient again over the next 7 days.  If patient returns call to provider office, please advise to call [place] at [email or web address]  [email or web address]   ??[phone]

## 2018-11-11 NOTE — Chronic Care Management (AMB) (Signed)
Chronic Care Management   Note  11/11/2018 Name: [name] [name] MRN: [id number] DOB: 1950-11-23  [name] [name] is a 68 y.o. year old female who is a primary care patient of [name], MD. I reached out to [name] [name] by phone today in response to a referral sent by [name]'s patient's health plan.     [name] [name] was given information about Chronic Care Management services today including:  1. CCM service includes personalized support from designated clinical staff supervised by her physician, including individualized plan of care and coordination with other care providers 2. 24/7 contact phone numbers for assistance for urgent and routine care needs. 3. Service will only be billed when office clinical staff spend 20 minutes or more in a month to coordinate care. 4. Only one practitioner may furnish and bill the service in a calendar month. 5. The patient may stop CCM services at any time (effective at the end of the month) by phone call to the office staff. 6. The patient will be responsible for cost sharing (co-pay) of up to 20% of the service fee (after annual deductible is met).  Patient agreed to services and verbal consent obtained.   Follow up plan: Telephone appointment with CCM team member scheduled for: 12/13/2018  [place]  [email or web address]   ??[phone]

## 2018-11-24 ENCOUNTER — Other Ambulatory Visit: Payer: Self-pay

## 2018-11-24 MED ORDER — CONTOUR NEXT TEST VI STRP: ORAL_STRIP | 0 refills | Status: DC

## 2018-12-02 ENCOUNTER — Encounter: Payer: Self-pay | Admitting: Internal Medicine

## 2018-12-02 ENCOUNTER — Telehealth (INDEPENDENT_AMBULATORY_CARE_PROVIDER_SITE_OTHER): Payer: Medicare Other | Admitting: Internal Medicine

## 2018-12-02 ENCOUNTER — Other Ambulatory Visit: Payer: Self-pay

## 2018-12-02 VITALS — BP 134/74 | HR 93 | Wt 195.0 lb

## 2018-12-02 DIAGNOSIS — E7800 Pure hypercholesterolemia, unspecified: Secondary | ICD-10-CM

## 2018-12-02 DIAGNOSIS — E119 Type 2 diabetes mellitus without complications: Secondary | ICD-10-CM | POA: Diagnosis not present

## 2018-12-02 DIAGNOSIS — I6523 Occlusion and stenosis of bilateral carotid arteries: Secondary | ICD-10-CM | POA: Diagnosis not present

## 2018-12-02 DIAGNOSIS — E6609 Other obesity due to excess calories: Secondary | ICD-10-CM | POA: Diagnosis not present

## 2018-12-02 DIAGNOSIS — Z6832 Body mass index (BMI) 32.0-32.9, adult: Secondary | ICD-10-CM | POA: Diagnosis not present

## 2018-12-02 MED ORDER — CRESTOR 5 MG PO TABS: 5.0000 mg | ORAL_TABLET | Freq: Every day | ORAL | 1 refills | Status: DC

## 2018-12-02 NOTE — Patient Instructions (Signed)
Preventing Influenza, Adult Influenza, more commonly known as "the flu," is a viral infection that mainly affects the respiratory tract. The respiratory tract includes structures that help you breathe, such as the lungs, nose, and throat. The flu causes many common cold symptoms, as well as a high fever and body aches. The flu spreads easily from person to person (is contagious). The flu is most common from December through March. This is called flu season.You can catch the flu virus by:  Breathing in droplets from an infected person's cough or sneeze.  Touching something that was recently contaminated with the virus and then touching your mouth, nose, or eyes. What can I do to lower my risk?        You can decrease your risk of getting the flu by:  Getting a flu shot (influenza vaccination) every year. This is the best way to prevent the flu. A flu shot is recommended for everyone age 6 months and older. ? It is best to get a flu shot in the fall, as soon as it is available. Getting a flu shot during winter or spring instead is still a good idea. Flu season can last into early spring. ? Preventing the flu through vaccination requires getting a new flu shot every year. This is because the flu virus changes slightly (mutates) from one year to the next. Even if a flu shot does not completely protect you from all flu virus mutations, it can reduce the severity of your illness and prevent dangerous complications of the flu. ? If you are pregnant, you can and should get a flu shot. ? If you have had a reaction to the shot in the past or if you are allergic to eggs, check with your health care provider before getting a flu shot. ? Sometimes the vaccine is available as a nasal spray. In some years, the nasal spray has not been as effective against the flu virus. Check with your health care provider if you have questions about this.  Practicing good health habits. This is especially important during  flu season. ? Avoid contact with people who are sick with flu or cold symptoms. ? Wash your hands with soap and water often. If soap and water are not available, use alcohol-based hand sanitizer. ? Avoid touching your hands to your face, especially when you have not washed your hands recently. ? Use a disinfectant to clean surfaces at home and at work that may be contaminated with the flu virus. ? Keep your body's disease-fighting system (immune system) in good shape by eating a healthy diet, drinking plenty of fluids, getting enough sleep, and exercising regularly. If you do get the flu, avoid spreading it to others by:  Staying home until your symptoms have been gone for at least one day.  Covering your mouth and nose when you cough or sneeze.  Avoiding close contact with others, especially babies and elderly people. Why are these changes important? Getting a flu shot and practicing good health habits protects you as well as other people. If you get the flu, your friends, family, and co-workers are also at risk of getting it, because it spreads so easily to others. Each year, about 2 out of every 10 people get the flu. Having the flu can lead to complications, such as pneumonia, ear infection, and sinus infection. The flu also can be deadly, especially for babies, people older than age 65, and people who have serious long-term diseases. How is this treated? Most   people recover from the flu by resting at home and drinking plenty of fluids. However, a prescription antiviral medicine may reduce your flu symptoms and may make your flu go away sooner. This medicine must be started within a few days of getting flu symptoms. You can talk with your health care provider about whether you need an antiviral medicine. Antiviral medicine may be prescribed for people who are at risk for more serious flu symptoms. This includes people who:  Are older than age 65.  Are pregnant.  Have a condition that  makes the flu worse or more dangerous. Where to find more information  Centers for Disease Control and Prevention: www.cdc.gov/flu/index.htm  Flu.gov: www.flu.gov/prevention-vaccination  American Academy of Family Physicians: familydoctor.org/familydoctor/en/kids/vaccines/preventing-the-flu.html Contact a health care provider if:  You have influenza and you develop new symptoms.  You have: ? Chest pain. ? Diarrhea. ? A fever.  Your cough gets worse, or you produce more mucus. Summary  The best way to prevent the flu is to get a flu shot every year in the fall.  Even if you get the flu after you have received the yearly vaccine, your flu may be milder and go away sooner because of your flu shot.  If you get the flu, antiviral medicines that are started with a few days of symptoms may reduce your flu symptoms and may make your flu go away sooner.  You can also help prevent the flu by practicing good health habits. This information is not intended to replace advice given to you by your health care provider. Make sure you discuss any questions you have with your health care provider. Document Released: 02/18/2015 Document Revised: 01/16/2017 Document Reviewed: 10/13/2015 Elsevier Patient Education  2020 [employer]  

## 2018-12-13 ENCOUNTER — Telehealth: Payer: Medicare Other

## 2018-12-14 NOTE — Progress Notes (Signed)
Virtual Visit via Video   This visit type was conducted due to national recommendations for restrictions regarding the COVID-19 Pandemic (e.g. social distancing) in an effort to limit this patient's exposure and mitigate transmission in our community.  Due to her co-morbid illnesses, this patient is at least at moderate risk for complications without adequate follow up.  This format is felt to be most appropriate for this patient at this time.  All issues noted in this document were discussed and addressed.  A limited physical exam was performed with this format.    This visit type was conducted due to national recommendations for restrictions regarding the COVID-19 Pandemic (e.g. social distancing) in an effort to limit this patient's exposure and mitigate transmission in our community.  Patients identity confirmed using two different identifiers.  This format is felt to be most appropriate for this patient at this time.  All issues noted in this document were discussed and addressed.  No physical exam was performed (except for noted visual exam findings with Video Visits).    Date:  12/14/2018   ID:  [name] [name], DOB Dec 14, 1950, MRN [id number]  Patient Location:  Home  Provider location:   Office    Chief Complaint:  "DM f/u"  History of Present Illness:    [name] G Barman is a 68 y.o. female who presents via video conferencing for a telehealth visit today.    The patient does not have symptoms concerning for COVID-19 infection (fever, chills, cough, or new shortness of breath).   She presents today for virtual visit. She prefers this method of contact due to COVID-19 pandemic. She reports that she has not been out much since the start of the [place] pandemic. She is concerned about her immune status b/c she is a cancer survivor. She is currentlly in remission and no longer receiving chemotherapy. She has no specific concerns at this time .  Diabetes She presents for her  follow-up diabetic visit. She has type 2 diabetes mellitus. There are no hypoglycemic associated symptoms. There are no diabetic associated symptoms. There are no diabetic complications. Risk factors for coronary artery disease include diabetes mellitus, dyslipidemia, hypertension and post-menopausal. She participates in exercise daily. An ACE inhibitor/angiotensin II receptor blocker is not being taken.     Past Medical History:  Diagnosis Date  . Arthritis    back & hip- R  . Breast cancer ([place]) 04/15/12   left-biopsy  . Breast cancer, right breast ([place]) 05/14/12   right mastectomy  . Bronchitis   . Cancer ([place])    breast  . 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 CHF with unknown LVEF ([place]) 03/09/2013  . Diabetes mellitus   . Diabetes mellitus without complication ([place])   . GERD (gastroesophageal reflux disease)    pt. experiencing on occasional , h/o og gastroparesis   . Heart murmur   . Hiatal hernia   . Osteopenia   . Pneumonia    treated as an outpt.   . Sinus problem   . Sleep apnea    sleep minimal concern- " along time ago"   Past Surgical History:  Procedure Laterality Date  . AXILLARY SENTINEL NODE BIOPSY Left 05/14/2012   Procedure: AXILLARY SENTINEL NODE BIOPSY;  Surgeon: Adin Hector, MD;  Location: [place];  Service: General;  Laterality: Left;  . BREAST RECONSTRUCTION WITH PLACEMENT OF TISSUE EXPANDER AND FLEX HD (ACELLULAR HYDRATED DERMIS) Bilateral 05/14/2012   Procedure: BREAST RECONSTRUCTION WITH PLACEMENT OF TISSUE EXPANDER AND FLEX  HD (ACELLULAR HYDRATED DERMIS);  Surgeon: Crissie Reese, MD;  Location: [place];  Service: Plastics;  Laterality: Bilateral;  . BUNIONECTOMY Bilateral   . CESAREAN SECTION  1981, 1983  . FOOT SURGERY    . GASTRIC BYPASS     1st surgery- at [facility], then an emergent surgery here  [facility]- for perforation of stomach & then a surgery for debridement   . PORT-A-CATH REMOVAL Right 09/12/2013   Procedure: REMOVAL PORT-A-CATH;   Surgeon: Adin Hector, MD;  Location: [place];  Service: General;  Laterality: Right;  . PORTACATH PLACEMENT N/A 05/14/2012   Procedure: INSERTION PORT-A-CATH;  Surgeon: Adin Hector, MD;  Location: [place];  Service: General;  Laterality: N/A;  . TONSILLECTOMY    . TOTAL HIP ARTHROPLASTY Right 03/02/2017  . TOTAL HIP ARTHROPLASTY Right 03/02/2017   Procedure: RIGHT TOTAL HIP ARTHROPLASTY ANTERIOR APPROACH;  Surgeon: Frederik Pear, MD;  Location: [place];  Service: Orthopedics;  Laterality: Right;  . TOTAL MASTECTOMY Bilateral 05/14/2012   Procedure: TOTAL MASTECTOMY;  Surgeon: Adin Hector, MD;  Location: [place];  Service: General;  Laterality: Bilateral;  . tummy tuck  1999  . WOUND DEBRIDEMENT  01/14/2011   Procedure: DEBRIDEMENT ABDOMINAL WOUND;  Surgeon: Adin Hector, MD;  Location: [place];  Service: General;  Laterality: N/A;  wound debridement and debridement on the abdomen     No outpatient medications have been marked as taking for the 12/02/18 encounter (Telemedicine) with [name], MD.     Allergies:   Darvon and Penicillins   Social History   Tobacco Use  . Smoking status: Former Smoker    Quit date: 02/25/2006    Years since quitting: 12.8  . Smokeless tobacco: Never Used  Substance Use Topics  . Alcohol use: Yes    Alcohol/week: 2.0 - 3.0 standard drinks    Types: 2 - 3 Glasses of wine per week    Comment: no liquor  . Drug use: No     Family Hx: The patient's family history includes Breast cancer (age of onset: 51) in her sister; Breast cancer (age of onset: 46) in her mother; Diabetes in her brother, father, mother, and sister; Heart attack in her brother, brother, and father; Parkinson's disease in her sister.  ROS:   Please see the history of present illness.    Review of Systems  Constitutional: Negative.   Respiratory: Negative.   Cardiovascular: Negative.   Gastrointestinal: Negative.   Neurological:  Negative.   Psychiatric/Behavioral: Negative.     All other systems reviewed and are negative.   Labs/Other Tests and Data Reviewed:    Recent Labs: 03/03/2018: ALT 18; BUN 8; Creatinine, Ser 0.63; Hemoglobin 13.0; Platelets 311; Potassium 4.6; Sodium 147   Recent Lipid Panel Lab Results  Component Value Date/Time   CHOL 176 03/03/2018 10:33 AM   TRIG 75 03/03/2018 10:33 AM   HDL 77 03/03/2018 10:33 AM   CHOLHDL 2.3 03/03/2018 10:33 AM   LDLCALC 84 03/03/2018 10:33 AM    Wt Readings from Last 3 Encounters:  12/02/18 195 lb (88.5 kg)  03/03/18 198 lb 9.6 oz (90.1 kg)  03/03/18 198 lb 9.6 oz (90.1 kg)     Exam:    Vital Signs:  BP 134/74   Pulse 93   Wt 195 lb (88.5 kg)   BMI 32.45 kg/m     Physical Exam  Constitutional: She is oriented to person, place, and time and well-developed, well-nourished, and  in no distress.  HENT:  Head: Normocephalic and atraumatic.  Neck: Normal range of motion.  Pulmonary/Chest: Effort normal.  Neurological: She is alert and oriented to person, place, and time.  Psychiatric: Affect normal.  Nursing note and vitals reviewed.   ASSESSMENT & PLAN:     1. Type 2 diabetes mellitus without complication, without long-term current use of insulin ([place])  She does not wish to come in for labwork at this time. She is encouraged to reconsider, we are willing to come draw blood at her car. She is encouraged to continue exercising four to five days weekly for at least 30 minutes.   2. Pure hypercholesterolemia  Chronic. She is encouraged to continue with Brand Crestor. She is intolerant of generic rosuvastatin.   3. Class 1 obesity due to excess calories with serious comorbidity and body mass index (BMI) of 32.0 to 32.9 in adult  She is encouraged to strive for BMI less than 29 to decrease cardiac risk.     COVID-19 Education: The signs and symptoms of COVID-19 were discussed with the patient and how to seek care for testing (follow up with  PCP or arrange E-visit).  The importance of social distancing was discussed today.  Patient Risk:   After full review of this patients clinical status, I feel that they are at least moderate risk at this time.      Medication Adjustments/Labs and Tests Ordered: Current medicines are reviewed at length with the patient today.  Concerns regarding medicines are outlined above.   Tests Ordered: No orders of the defined types were placed in this encounter.   Medication Changes: Meds ordered this encounter  Medications  . CRESTOR 5 MG tablet    Sig: Take 1 tablet (5 mg total) by mouth daily.    Dispense:  90 tablet    Refill:  1    Disposition:  Follow up in 4 month(s)  Signed, [name], MD

## 2018-12-16 DIAGNOSIS — M67912 Unspecified disorder of synovium and tendon, left shoulder: Secondary | ICD-10-CM | POA: Diagnosis not present

## 2018-12-16 DIAGNOSIS — Z96641 Presence of right artificial hip joint: Secondary | ICD-10-CM | POA: Diagnosis not present

## 2018-12-16 DIAGNOSIS — Z09 Encounter for follow-up examination after completed treatment for conditions other than malignant neoplasm: Secondary | ICD-10-CM | POA: Diagnosis not present

## 2019-02-22 ENCOUNTER — Telehealth: Payer: Self-pay

## 2019-02-22 ENCOUNTER — Ambulatory Visit: Payer: Self-pay

## 2019-02-22 DIAGNOSIS — Z6832 Body mass index (BMI) 32.0-32.9, adult: Secondary | ICD-10-CM

## 2019-02-22 DIAGNOSIS — E7800 Pure hypercholesterolemia, unspecified: Secondary | ICD-10-CM

## 2019-02-22 DIAGNOSIS — E119 Type 2 diabetes mellitus without complications: Secondary | ICD-10-CM

## 2019-02-22 DIAGNOSIS — E6609 Other obesity due to excess calories: Secondary | ICD-10-CM

## 2019-02-22 NOTE — Chronic Care Management (AMB) (Signed)
  Chronic Care Management   Outreach Note  02/22/2019 Name: [name] [name] MRN: [id number] DOB: Jul 11, 1950  Referred by: [name], MD Reason for referral : Chronic Care Management (INITIAL CCM RNCM Telephone Outreach )   An unsuccessful telephone outreach was attempted today. The patient was referred to the case management team by [name] MD for assistance with care management and care coordination.   Follow Up Plan: Telephone follow up appointment with care management team member scheduled for: 02/28/19  Barb Merino, RN, BSN, CCM Care Management Coordinator [place] [facility]  Direct Phone: [phone]

## 2019-02-28 ENCOUNTER — Telehealth: Payer: Self-pay

## 2019-03-01 ENCOUNTER — Telehealth: Payer: Self-pay | Admitting: Internal Medicine

## 2019-03-01 NOTE — Telephone Encounter (Signed)
I called the pt to reschedule her AWV and OV from 1/19 to 1/20. She said that she's not coming to any appointments right now because of COVID and wanted me to cancel them. I explained that we could do the AWV virtually.  She declined video, but agreed to an audio AWV.

## 2019-03-08 ENCOUNTER — Ambulatory Visit: Payer: Medicare Other

## 2019-03-08 ENCOUNTER — Ambulatory Visit: Payer: Medicare Other | Admitting: Internal Medicine

## 2019-03-09 ENCOUNTER — Other Ambulatory Visit: Payer: Self-pay

## 2019-03-09 ENCOUNTER — Ambulatory Visit (INDEPENDENT_AMBULATORY_CARE_PROVIDER_SITE_OTHER): Payer: Medicare Other

## 2019-03-09 VITALS — BP 125/79 | HR 91 | Ht 64.0 in | Wt 197.0 lb

## 2019-03-09 DIAGNOSIS — Z0000 Encounter for general adult medical examination without abnormal findings: Secondary | ICD-10-CM | POA: Diagnosis not present

## 2019-03-09 NOTE — Progress Notes (Signed)
This visit type was conducted due to national recommendations for restrictions regarding the COVID-19 Pandemic (e.g. social distancing). This format is felt to be most appropriate for this patient at this time. All issues noted in this document were discussed and addressed. No physical exam was performed (except for noted visual exam findings with Video Visits). This patient, [name], has given permission to perform this visit via telephone. Vital signs may be absent or patient reported.  Patient location:  At home  Nurse location:  [place] office    Subjective:   [name] [name] is a 69 y.o. female who presents for Medicare Annual (Subsequent) preventive examination.  Review of Systems:  n/a Cardiac Risk Factors include: advanced age (>37men, >16 women);diabetes mellitus;obesity (BMI >30kg/m2)     Objective:     Vitals: BP 125/79 Comment: per patient  Pulse 91 Comment: per patient  Ht 5\' 4"  (1.626 m) Comment: per patient  Wt 197 lb (89.4 kg) Comment: per patient  BMI 33.81 kg/m   Body mass index is 33.81 kg/m.  Advanced Directives 03/09/2019 03/03/2018 03/02/2017 02/25/2017 05/15/2016 01/01/2016 07/25/2015  Does Patient Have a Medical Advance Directive? Yes Yes Yes Yes Yes Yes Yes  Type of Paramedic of [place];Living will [place];Living will Living will;Healthcare Power of Attorney - Living will;Healthcare Power of Attorney - -  Does patient want to make changes to medical advance directive? - No - Patient declined No - Patient declined No - Patient declined - - -  Copy of [place] in Chart? No - copy requested No - copy requested No - copy requested - - - -  Pre-existing out of facility DNR order (yellow form or pink MOST form) - - - - - - -    Tobacco Social History   Tobacco Use  Smoking Status Former Smoker  . Quit date: 02/25/2006  . Years since quitting: 13.0  Smokeless Tobacco Never Used       Counseling given: Not Answered   Clinical Intake:  Pre-visit preparation completed: Yes  Pain : No/denies pain     Nutritional Status: BMI > 30  Obese Nutritional Risks: None Diabetes: Yes  How often do you need to have someone help you when you read instructions, pamphlets, or other written materials from your doctor or pharmacy?: 1 - Never What is the last grade level you completed in school?: master's degree  Interpreter Needed?: No  Information entered by :: NAllen LPN  Past Medical History:  Diagnosis Date  . Arthritis    back & hip- R  . Breast cancer ([place]) 04/15/12   left-biopsy  . Breast cancer, right breast ([place]) 05/14/12   right mastectomy  . Bronchitis   . Cancer ([place])    breast  . 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 CHF with unknown LVEF ([place]) 03/09/2013  . Diabetes mellitus   . Diabetes mellitus without complication ([place])   . GERD (gastroesophageal reflux disease)    pt. experiencing on occasional , h/o og gastroparesis   . Heart murmur   . Hiatal hernia   . Osteopenia   . Pneumonia    treated as an outpt.   . Sinus problem   . Sleep apnea    sleep minimal concern- " along time ago"   Past Surgical History:  Procedure Laterality Date  . AXILLARY SENTINEL NODE BIOPSY Left 05/14/2012   Procedure: AXILLARY SENTINEL NODE BIOPSY;  Surgeon: Adin Hector, MD;  [facility]  OR;  Service: General;  Laterality: Left;  . BREAST RECONSTRUCTION WITH PLACEMENT OF TISSUE EXPANDER AND FLEX HD (ACELLULAR HYDRATED DERMIS) Bilateral 05/14/2012   Procedure: BREAST RECONSTRUCTION WITH PLACEMENT OF TISSUE EXPANDER AND FLEX HD (ACELLULAR HYDRATED DERMIS);  Surgeon: Crissie Reese, MD;  Location: [place];  Service: Plastics;  Laterality: Bilateral;  . BUNIONECTOMY Bilateral   . CESAREAN SECTION  1981, 1983  . FOOT SURGERY    . GASTRIC BYPASS     1[facility] surgery- [facility], then an emergent surgery here  [facility] [address]- for perforation of stomach & then a surgery for  debridement   . PORT-A-CATH REMOVAL Right 09/12/2013   Procedure: REMOVAL PORT-A-CATH;  Surgeon: Adin Hector, MD;  Location: [place];  Service: General;  Laterality: Right;  . PORTACATH PLACEMENT N/A 05/14/2012   Procedure: INSERTION PORT-A-CATH;  Surgeon: Adin Hector, MD;  Location: [place];  Service: General;  Laterality: N/A;  . TONSILLECTOMY    . TOTAL HIP ARTHROPLASTY Right 03/02/2017  . TOTAL HIP ARTHROPLASTY Right 03/02/2017   Procedure: RIGHT TOTAL HIP ARTHROPLASTY ANTERIOR APPROACH;  Surgeon: Frederik Pear, MD;  Location: [place];  Service: Orthopedics;  Laterality: Right;  . TOTAL MASTECTOMY Bilateral 05/14/2012   Procedure: TOTAL MASTECTOMY;  Surgeon: Adin Hector, MD;  Location: [place];  Service: General;  Laterality: Bilateral;  . tummy tuck  1999  . WOUND DEBRIDEMENT  01/14/2011   Procedure: DEBRIDEMENT ABDOMINAL WOUND;  Surgeon: Adin Hector, MD;  Location: [place];  Service: General;  Laterality: N/A;  wound debridement and debridement on the abdomen   Family History  Problem Relation Age of Onset  . Heart attack Father   . Diabetes Father   . Heart attack Brother   . Diabetes Brother   . Diabetes Sister   . Breast cancer Sister 39  . Diabetes Mother   . Breast cancer Mother 40  . Parkinson's disease Sister   . Heart attack Brother    Social History   Socioeconomic History  . Marital status: Married    Spouse name: Joe  . Number of children: 2  . Years of education: 65  . Highest education level: Not on file  Occupational History  . 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 Occupation: retired  Tobacco Use  . Smoking status: Former Smoker    Quit date: 02/25/2006    Years since quitting: 13.0  . Smokeless tobacco: Never Used  Substance and Sexual Activity  . Alcohol use: Yes    Alcohol/week: 2.0 - 3.0 standard drinks    Types: 2 - 3 Glasses of wine per week    Comment: no liquor  . Drug  use: No  . Sexual activity: Yes    Partners: Male    Birth control/protection: Post-menopausal  Other Topics Concern  . Not on file  Social History Narrative  . Not on file   Social Determinants of Health   Financial Resource Strain: Low Risk   . Difficulty of Paying Living Expenses: Not hard at all  Food Insecurity: No Food Insecurity  . Worried About Charity fundraiser in the Last Year: Never true  . Ran Out of Food in the Last Year: Never true  Transportation Needs: No Transportation Needs  . Lack of Transportation (Medical): No  . Lack of Transportation (Non-Medical): No  Physical Activity: Sufficiently Active  . Days of Exercise per Week: 5 days  . Minutes of  Exercise per Session: 60 min  Stress: No Stress Concern Present  . Feeling of Stress : Not at all  Social Connections:   . Frequency of Communication with Friends and Family: Not on file  . Frequency of Social Gatherings with Friends and Family: Not on file  . Attends Religious Services: Not on file  . Active Member of Clubs or Organizations: Not on file  . Attends Archivist Meetings: Not on file  . Marital Status: Not on file    Outpatient Encounter Medications as of 03/09/2019  Medication Sig  . Ascorbic Acid (VITAMIN C) 500 MG tablet Take 500 mg by mouth daily.    . Cholecalciferol (VITAMIN D PO) Take 1 tablet by mouth daily.  . CRESTOR 5 MG tablet Take 1 tablet (5 mg total) by mouth daily.  Marland Kitchen Dexlansoprazole (DEXILANT) 30 MG capsule Take 30 mg by mouth daily.  Marland Kitchen glucose blood (CONTOUR NEXT TEST) test strip Use as instructed TO TEST BLOOD SUGARS ONCE DAILY. DX: E11.9  . JANUVIA 100 MG tablet Take 1 tablet by mouth once daily  . KRILL OIL PO Take 1 capsule by mouth daily.   . magnesium oxide (MAG-OX) 400 MG tablet Take 400 mg by mouth daily.  . Menthol, Topical Analgesic, (BLUE-EMU MAXIMUM STRENGTH EX) Apply 1 application topically daily.  . Multiple Vitamin (MULTIVITAMIN PO) Take 1 tablet by mouth  daily.   Marland Kitchen tiZANidine (ZANAFLEX) 2 MG tablet Take 1 tablet (2 mg total) by mouth every 6 (six) hours as needed for muscle spasms.  . Turmeric (CURCUMIN 95 PO) Take 1 capsule by mouth daily.  Marland Kitchen apixaban (ELIQUIS) 2.5 MG TABS tablet Take 1 tablet (2.5 mg total) by mouth 2 (two) times daily. (Patient not taking: Reported on 03/03/2018)  . Glucos-Chond-Hyal Ac-Ca Fructo (MOVE FREE JOINT HEALTH ADVANCE PO) Take 1 tablet by mouth daily.  Marland Kitchen letrozole (FEMARA) 2.5 MG tablet TAKE 1 TABLET (2.5 MG TOTAL) BY MOUTH DAILY. (Patient not taking: Reported on 03/03/2018)  . PRESCRIPTION MEDICATION Take 1 tablet by mouth every evening. Domperidone 10 mg  . Probiotic Product (PROBIOTIC PO) Take 1 capsule by mouth daily.   No facility-administered encounter medications on file as of 03/09/2019.    Activities of Daily Living In your present state of health, do you have any difficulty performing the following activities: 03/09/2019  Hearing? N  Vision? N  Difficulty concentrating or making decisions? N  Walking or climbing stairs? N  Dressing or bathing? N  Doing errands, shopping? N  Preparing Food and eating ? N  Using the Toilet? N  In the past six months, have you accidently leaked urine? N  Do you have problems with loss of bowel control? N  Managing your Medications? N  Managing your Finances? N  Housekeeping or managing your Housekeeping? N  Some recent data might be hidden    Patient Care Team: [name], MD as PCP - General (Internal Medicine) Crawford Givens, MD as Consulting Physician (Obstetrics and Gynecology) Fanny Skates, MD as Consulting Physician (General Surgery) Crissie Reese, MD as Consulting Physician (Plastic Surgery) Belva Crome, MD as Consulting Physician (Cardiology) Marylynn Pearson, MD as Consulting Physician (Ophthalmology) Rex Kras, Claudette Stapler, RN as [place] Management    Assessment:   This is a routine wellness examination for [name].  Exercise  Activities and Dietary recommendations Current Exercise Habits: Home exercise routine, Type of exercise: treadmill(stationary bike), Time (Minutes): 60, Frequency (Times/Week): 5, Weekly Exercise (Minutes/Week): 300  Goals    .  Exercise 3x per week (30 min per time)     Wants to exercise 5-6 days per week    . Patient Stated     03/09/2019, wants to stay healthy       Fall Risk Fall Risk  03/09/2019 03/03/2018  Falls in the past year? 0 0  Risk for fall due to : Medication side effect Medication side effect  Follow up Falls evaluation completed;Education provided;Falls prevention discussed Education provided;Falls prevention discussed   Is the patient's home free of loose throw rugs in walkways, pet beds, electrical cords, etc?   yes      Grab bars in the bathroom? yes      Handrails on the stairs?   yes      Adequate lighting?   yes  Timed Get Up and Go performed: n/a  Depression Screen PHQ 2/9 Scores 03/09/2019 03/03/2018  PHQ - 2 Score 0 0  PHQ- 9 Score 0 0     Cognitive Function     6CIT Screen 03/09/2019 03/03/2018  What Year? 0 points 0 points  What month? 0 points 0 points  What time? 0 points 0 points  Count back from 20 0 points 0 points  Months in reverse 0 points 0 points  Repeat phrase 0 points 0 points  Total Score 0 0    Immunization History  Administered Date(s) Administered  . Influenza,inj,Quad PF,6+ Mos 11/05/2012  . Influenza-Unspecified 12/18/2017, 10/06/2018  . Pneumococcal Conjugate-13 10/06/2018  . Pneumococcal Polysaccharide-23 03/03/2017  . Zoster Recombinat (Shingrix) 07/01/2017    Qualifies for Shingles Vaccine? yes  Screening Tests Health Maintenance  Topic Date Due  . Hepatitis C Screening  [birth year]  . OPHTHALMOLOGY EXAM  03/31/1960  . HEMOGLOBIN A1C  09/01/2018  . URINE MICROALBUMIN  03/04/2019  . FOOT EXAM  05/19/2019 (Originally [birth year]31/1960)  . MAMMOGRAM  03/08/2020 (Originally 03/29/2014)  . TETANUS/TDAP  02/03/2021  .  COLONOSCOPY  04/29/2022  . INFLUENZA VACCINE  Completed  . DEXA SCAN  Completed  . PNA vac Low Risk Adult  Completed    Cancer Screenings: Lung: Low Dose CT Chest recommended if Age 72-80 years, 30 pack-year currently smoking OR have quit w/in 15years. Patient does not qualify. Breast:  Up to date on Mammogram? Yes   Up to date of Bone Density/Dexa? Yes Colorectal: up to date  Additional Screenings: : Hepatitis C Screening: due     Plan:    Patient wants to stay healthy.   I have personally reviewed and noted the following in the patient's chart:   . Medical and social history . Use of alcohol, tobacco or illicit drugs  . Current medications and supplements . Functional ability and status . Nutritional status . Physical activity . Advanced directives . List of other physicians . Hospitalizations, surgeries, and ER visits in previous 12 months . Vitals . Screenings to include cognitive, depression, and falls . Referrals and appointments  In addition, I have reviewed and discussed with patient certain preventive protocols, quality metrics, and best practice recommendations. A written personalized care plan for preventive services as well as general preventive health recommendations were provided to patient.     Kellie Simmering, LPN  [id number]

## 2019-03-09 NOTE — Patient Instructions (Signed)
[name] , Thank you for taking time to come for your Medicare Wellness Visit. I appreciate your ongoing commitment to your health goals. Please review the following plan we discussed and let me know if I can assist you in the future.   Screening recommendations/referrals: Colonoscopy: 04/2012 Mammogram: not required Bone Density: 02/2013 Recommended yearly ophthalmology/optometry visit for glaucoma screening and checkup Recommended yearly dental visit for hygiene and checkup  Vaccinations: Influenza vaccine: 09/2018 Pneumococcal vaccine: 09/2018 Tdap vaccine: 11/2010 Shingles vaccine: 06/2017    Advanced directives: Please bring a copy of your POA (Power of [place]) and/or Living Will to your next appointment.   Conditions/risks identified: obesity  Next appointment:    Preventive Care 60 Years and Older, Female Preventive care refers to lifestyle choices and visits with your health care provider that can promote health and wellness. What does preventive care include?  A yearly physical exam. This is also called an annual well check.  Dental exams once or twice a year.  Routine eye exams. Ask your health care provider how often you should have your eyes checked.  Personal lifestyle choices, including:  Daily care of your teeth and gums.  Regular physical activity.  Eating a healthy diet.  Avoiding tobacco and drug use.  Limiting alcohol use.  Practicing safe sex.  Taking low-dose aspirin every day.  Taking vitamin and mineral supplements as recommended by your health care provider. What happens during an annual well check? The services and screenings done by your health care provider during your annual well check will depend on your age, overall health, lifestyle risk factors, and family history of disease. Counseling  Your health care provider may ask you questions about your:  Alcohol use.  Tobacco use.  Drug use.  Emotional well-being.  Home and  relationship well-being.  Sexual activity.  Eating habits.  History of falls.  Memory and ability to understand (cognition).  Work and work Statistician.  Reproductive health. Screening  You may have the following tests or measurements:  Height, weight, and BMI.  Blood pressure.  Lipid and cholesterol levels. These may be checked every 5 years, or more frequently if you are over 42 years old.  Skin check.  Lung cancer screening. You may have this screening every year starting at age 65 if you have a 30-pack-year history of smoking and currently smoke or have quit within the past 15 years.  Fecal occult blood test (FOBT) of the stool. You may have this test every year starting at age 69.  Flexible sigmoidoscopy or colonoscopy. You may have a sigmoidoscopy every 5 years or a colonoscopy every 10 years starting at age 36.  Hepatitis C blood test.  Hepatitis B blood test.  Sexually transmitted disease (STD) testing.  Diabetes screening. This is done by checking your blood sugar (glucose) after you have not eaten for a while (fasting). You may have this done every 1-3 years.  Bone density scan. This is done to screen for osteoporosis. You may have this done starting at age 38.  Mammogram. This may be done every 1-2 years. Talk to your health care provider about how often you should have regular mammograms. Talk with your health care provider about your test results, treatment options, and if necessary, the need for more tests. Vaccines  Your health care provider may recommend certain vaccines, such as:  Influenza vaccine. This is recommended every year.  Tetanus, diphtheria, and acellular pertussis (Tdap, Td) vaccine. You may need a Td booster every 10  years.  Zoster vaccine. You may need this after age 69.  Pneumococcal 13-valent conjugate (PCV13) vaccine. One dose is recommended after age 57.  Pneumococcal polysaccharide (PPSV23) vaccine. One dose is recommended after  age 52. Talk to your health care provider about which screenings and vaccines you need and how often you need them. This information is not intended to replace advice given to you by your health care provider. Make sure you discuss any questions you have with your health care provider. Document Released: 03/02/2015 Document Revised: 10/24/2015 Document Reviewed: 12/05/2014 Elsevier Interactive Patient Education  2017 [place] Prevention in the Home Falls can cause injuries. They can happen to people of all ages. There are many things you can do to make your home safe and to help prevent falls. What can I do on the outside of my home?  Regularly fix the edges of walkways and driveways and fix any cracks.  Remove anything that might make you trip as you walk through a door, such as a raised step or threshold.  Trim any bushes or trees on the path to your home.  Use bright outdoor lighting.  Clear any walking paths of anything that might make someone trip, such as rocks or tools.  Regularly check to see if handrails are loose or broken. Make sure that both sides of any steps have handrails.  Any raised decks and porches should have guardrails on the edges.  Have any leaves, snow, or ice cleared regularly.  Use sand or salt on walking paths during winter.  Clean up any spills in your garage right away. This includes oil or grease spills. What can I do in the bathroom?  Use night lights.  Install grab bars by the toilet and in the tub and shower. Do not use towel bars as grab bars.  Use non-skid mats or decals in the tub or shower.  If you need to sit down in the shower, use a plastic, non-slip stool.  Keep the floor dry. Clean up any water that spills on the floor as soon as it happens.  Remove soap buildup in the tub or shower regularly.  Attach bath mats securely with double-sided non-slip rug tape.  Do not have throw rugs and other things on the floor that can  make you trip. What can I do in the bedroom?  Use night lights.  Make sure that you have a light by your bed that is easy to reach.  Do not use any sheets or blankets that are too big for your bed. They should not hang down onto the floor.  Have a firm chair that has side arms. You can use this for support while you get dressed.  Do not have throw rugs and other things on the floor that can make you trip. What can I do in the kitchen?  Clean up any spills right away.  Avoid walking on wet floors.  Keep items that you use a lot in easy-to-reach places.  If you need to reach something above you, use a strong step stool that has a grab bar.  Keep electrical cords out of the way.  Do not use floor polish or wax that makes floors slippery. If you must use wax, use non-skid floor wax.  Do not have throw rugs and other things on the floor that can make you trip. What can I do with my stairs?  Do not leave any items on the stairs.  Make sure that  there are handrails on both sides of the stairs and use them. Fix handrails that are broken or loose. Make sure that handrails are as long as the stairways.  Check any carpeting to make sure that it is firmly attached to the stairs. Fix any carpet that is loose or worn.  Avoid having throw rugs at the top or bottom of the stairs. If you do have throw rugs, attach them to the floor with carpet tape.  Make sure that you have a light switch at the top of the stairs and the bottom of the stairs. If you do not have them, ask someone to add them for you. What else can I do to help prevent falls?  Wear shoes that:  Do not have high heels.  Have rubber bottoms.  Are comfortable and fit you well.  Are closed at the toe. Do not wear sandals.  If you use a stepladder:  Make sure that it is fully opened. Do not climb a closed stepladder.  Make sure that both sides of the stepladder are locked into place.  Ask someone to hold it for you,  if possible.  Clearly mark and make sure that you can see:  Any grab bars or handrails.  First and last steps.  Where the edge of each step is.  Use tools that help you move around (mobility aids) if they are needed. These include:  Canes.  Walkers.  Scooters.  Crutches.  Turn on the lights when you go into a dark area. Replace any light bulbs as soon as they burn out.  Set up your furniture so you have a clear path. Avoid moving your furniture around.  If any of your floors are uneven, fix them.  If there are any pets around you, be aware of where they are.  Review your medicines with your doctor. Some medicines can make you feel dizzy. This can increase your chance of falling. Ask your doctor what other things that you can do to help prevent falls. This information is not intended to replace advice given to you by your health care provider. Make sure you discuss any questions you have with your health care provider. Document Released: 11/30/2008 Document Revised: 07/12/2015 Document Reviewed: 03/10/2014 Elsevier Interactive Patient Education  2017 [employer].

## 2019-04-06 ENCOUNTER — Telehealth: Payer: Medicare Other

## 2019-04-06 ENCOUNTER — Other Ambulatory Visit: Payer: Self-pay

## 2019-04-06 ENCOUNTER — Ambulatory Visit: Payer: Self-pay

## 2019-04-06 DIAGNOSIS — E119 Type 2 diabetes mellitus without complications: Secondary | ICD-10-CM

## 2019-04-06 DIAGNOSIS — E6609 Other obesity due to excess calories: Secondary | ICD-10-CM

## 2019-04-06 DIAGNOSIS — E7800 Pure hypercholesterolemia, unspecified: Secondary | ICD-10-CM

## 2019-04-06 NOTE — Chronic Care Management (AMB) (Signed)
  Chronic Care Management   Outreach Note  04/06/2019 Name: [name] [name] MRN: [id number] DOB: 02/09/1951  Referred by: [name], MD Reason for referral : Chronic Care Management (RQ New #2 call - DM, HLD, Obesity)   A second unsuccessful telephone outreach was attempted today. The patient was referred to the case management team for assistance with care management and care coordination. I spoke with [name] [name] by telephone briefly, however, she did not feel comfortable verifying her identity with me over the phone. [name] [name] stated she will check with Dr. Baird Cancer to confirm my call is legitimate and she will give me a call back.  Follow Up Plan: Telephone follow up appointment with care management team member scheduled for:05/02/19  Barb Merino, RN, BSN, CCM Care Management Coordinator [facility]  Direct Phone: [phone]

## 2019-04-28 ENCOUNTER — Other Ambulatory Visit: Payer: Self-pay | Admitting: Internal Medicine

## 2019-05-02 ENCOUNTER — Telehealth: Payer: Medicare Other

## 2019-05-02 ENCOUNTER — Other Ambulatory Visit: Payer: Self-pay

## 2019-05-02 ENCOUNTER — Ambulatory Visit: Payer: Self-pay

## 2019-05-02 DIAGNOSIS — Z6832 Body mass index (BMI) 32.0-32.9, adult: Secondary | ICD-10-CM

## 2019-05-02 DIAGNOSIS — E119 Type 2 diabetes mellitus without complications: Secondary | ICD-10-CM

## 2019-05-02 DIAGNOSIS — E7800 Pure hypercholesterolemia, unspecified: Secondary | ICD-10-CM

## 2019-05-02 DIAGNOSIS — E6609 Other obesity due to excess calories: Secondary | ICD-10-CM

## 2019-05-03 NOTE — Chronic Care Management (AMB) (Signed)
  Chronic Care Management   Outreach Note  05/03/2019 Name: [name] [name] MRN: [id number] DOB: 1950-04-19  Referred by: [name], MD Reason for referral : Chronic Care Management (RQ #3 Initial Call - DM/HLD/Obesity)   Third unsuccessful telephone outreach was attempted today. The patient was referred to the case management team for assistance with care management and care coordination. The patient's primary care provider has been notified of our unsuccessful attempts to make or maintain contact with the patient. The care management team is pleased to engage with this patient at any time in the future should he/she be interested in assistance from the care management team.   Follow Up Plan: The care management team is available to follow up with the patient after provider conversation with the patient regarding recommendation for care management engagement and subsequent re-referral to the care management team.   Barb Merino, RN, BSN, CCM Care Management Coordinator [facility]  Direct Phone: [phone]

## 2019-05-25 ENCOUNTER — Telehealth: Payer: Self-pay

## 2019-05-25 NOTE — Telephone Encounter (Signed)
PT CONSENTED TO VIRTUAL VISIT 04/13.

## 2019-05-26 NOTE — Telephone Encounter (Signed)
error 

## 2019-05-31 ENCOUNTER — Other Ambulatory Visit: Payer: Self-pay

## 2019-05-31 ENCOUNTER — Telehealth (INDEPENDENT_AMBULATORY_CARE_PROVIDER_SITE_OTHER): Payer: Medicare Other | Admitting: Internal Medicine

## 2019-05-31 ENCOUNTER — Encounter: Payer: Self-pay | Admitting: Internal Medicine

## 2019-05-31 DIAGNOSIS — E6609 Other obesity due to excess calories: Secondary | ICD-10-CM | POA: Diagnosis not present

## 2019-05-31 DIAGNOSIS — Z9884 Bariatric surgery status: Secondary | ICD-10-CM

## 2019-05-31 DIAGNOSIS — Z6832 Body mass index (BMI) 32.0-32.9, adult: Secondary | ICD-10-CM

## 2019-05-31 DIAGNOSIS — E7800 Pure hypercholesterolemia, unspecified: Secondary | ICD-10-CM | POA: Diagnosis not present

## 2019-05-31 DIAGNOSIS — E119 Type 2 diabetes mellitus without complications: Secondary | ICD-10-CM

## 2019-05-31 MED ORDER — CRESTOR 5 MG PO TABS: 5.0000 mg | ORAL_TABLET | Freq: Every day | ORAL | 1 refills | Status: DC

## 2019-05-31 NOTE — Patient Instructions (Signed)

## 2019-06-02 ENCOUNTER — Telehealth: Payer: Medicare Other | Admitting: Internal Medicine

## 2019-06-12 NOTE — Progress Notes (Signed)
Virtual Visit via Video   This visit type was conducted due to national recommendations for restrictions regarding the COVID-19 Pandemic (e.g. social distancing) in an effort to limit this patient's exposure and mitigate transmission in our community.  Due to her co-morbid illnesses, this patient is at least at moderate risk for complications without adequate follow up.  This format is felt to be most appropriate for this patient at this time.  All issues noted in this document were discussed and addressed.  A limited physical exam was performed with this format.    This visit type was conducted due to national recommendations for restrictions regarding the COVID-19 Pandemic (e.g. social distancing) in an effort to limit this patient's exposure and mitigate transmission in our community.  Patients identity confirmed using two different identifiers.  This format is felt to be most appropriate for this patient at this time.  All issues noted in this document were discussed and addressed.  No physical exam was performed (except for noted visual exam findings with Video Visits).    Date:  06/12/2019   ID:  [name] [name], DOB 09-06-1950, MRN [id number]  Patient Location:  Home  Provider location:   Office    Chief Complaint:  "I have dm check"  History of Present Illness:    [name] [name] is a 69 y.o. female who presents via video conferencing for a telehealth visit today.    The patient does not have symptoms concerning for COVID-19 infection (fever, chills, cough, or new shortness of breath).   She presents today for virtual visit. She prefers this method of contact due to COVID-19 pandemic. She presents for a diabetes check. She is not yet comfortable with coming into the office for visits. She feels well. Offers no complaints.   Diabetes She presents for her follow-up diabetic visit. She has type 2 diabetes mellitus. There are no hypoglycemic associated symptoms. There are no  diabetic associated symptoms. There are no diabetic complications. Risk factors for coronary artery disease include diabetes mellitus, dyslipidemia, hypertension and post-menopausal. She participates in exercise daily. An ACE inhibitor/angiotensin II receptor blocker is not being taken.     Past Medical History:  Diagnosis Date  . Arthritis    back & hip- R  . Breast cancer ([place]) 04/15/12   left-biopsy  . Breast cancer, right breast ([place]) 05/14/12   right mastectomy  . Bronchitis   . Cancer ([place])    breast  . 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 CHF with unknown LVEF ([place]) 03/09/2013  . Diabetes mellitus   . Diabetes mellitus without complication ([place])   . GERD (gastroesophageal reflux disease)    pt. experiencing on occasional , h/o og gastroparesis   . Heart murmur   . Hiatal hernia   . Osteopenia   . Pneumonia    treated as an outpt.   . Sinus problem   . Sleep apnea    sleep minimal concern- " along time ago"   Past Surgical History:  Procedure Laterality Date  . AXILLARY SENTINEL NODE BIOPSY Left 05/14/2012   Procedure: AXILLARY SENTINEL NODE BIOPSY;  Surgeon: Adin Hector, MD;  Location: [facility];  Service: General;  Laterality: Left;  . BREAST RECONSTRUCTION WITH PLACEMENT OF TISSUE EXPANDER AND FLEX HD (ACELLULAR HYDRATED DERMIS) Bilateral 05/14/2012   Procedure: BREAST RECONSTRUCTION WITH PLACEMENT OF TISSUE EXPANDER AND FLEX HD (ACELLULAR HYDRATED DERMIS);  Surgeon: Crissie Reese, MD;  Location: [facility];  Service: Plastics;  Laterality: Bilateral;  .  BUNIONECTOMY Bilateral   . CESAREAN SECTION  1981, 1983  . FOOT SURGERY    . GASTRIC BYPASS     1st surgery- at [facility], then an emergent surgery here  [facility]- for perforation of stomach & then a surgery for debridement   . PORT-A-CATH REMOVAL Right 09/12/2013   Procedure: REMOVAL PORT-A-CATH;  Surgeon: Adin Hector, MD;  Location: [facility];  Service: General;  Laterality: Right;  . PORTACATH  PLACEMENT N/A 05/14/2012   Procedure: INSERTION PORT-A-CATH;  Surgeon: Adin Hector, MD;  Location: [facility];  Service: General;  Laterality: N/A;  . TONSILLECTOMY    . TOTAL HIP ARTHROPLASTY Right 03/02/2017  . TOTAL HIP ARTHROPLASTY Right 03/02/2017   Procedure: RIGHT TOTAL HIP ARTHROPLASTY ANTERIOR APPROACH;  Surgeon: Frederik Pear, MD;  Location: [facility];  Service: Orthopedics;  Laterality: Right;  . TOTAL MASTECTOMY Bilateral 05/14/2012   Procedure: TOTAL MASTECTOMY;  Surgeon: Adin Hector, MD;  Location: [facility];  Service: General;  Laterality: Bilateral;  . tummy tuck  1999  . WOUND DEBRIDEMENT  01/14/2011   Procedure: DEBRIDEMENT ABDOMINAL WOUND;  Surgeon: Adin Hector, MD;  Location: [facility];  Service: General;  Laterality: N/A;  wound debridement and debridement on the abdomen     Current Meds  Medication Sig  . Ascorbic Acid (VITAMIN C) 500 MG tablet Take 500 mg by mouth daily.    . Cholecalciferol (VITAMIN D PO) Take 1 tablet by mouth daily.  . CRESTOR 5 MG tablet Take 1 tablet (5 mg total) by mouth daily. DAW1  . Dexlansoprazole (DEXILANT) 30 MG capsule Take 30 mg by mouth daily.  Marland Kitchen glucose blood (CONTOUR NEXT TEST) test strip Use as instructed TO TEST BLOOD SUGARS ONCE DAILY. DX: E11.9  . JANUVIA 100 MG tablet Take 1 tablet by mouth once daily  . KRILL OIL PO Take 1 capsule by mouth daily.   . magnesium oxide (MAG-OX) 400 MG tablet Take 400 mg by mouth daily.  . Multiple Vitamin (MULTIVITAMIN PO) Take 1 tablet by mouth daily.   . Probiotic Product (PROBIOTIC PO) Take 1 capsule by mouth daily.  . Turmeric (CURCUMIN 95 PO) Take 1 capsule by mouth daily.  . [DISCONTINUED] CRESTOR 5 MG tablet Take 1 tablet (5 mg total) by mouth daily.     Allergies:   Darvon and Penicillins   Social History   Tobacco Use  . Smoking status: Former Smoker    Quit date: 02/25/2006    Years since quitting: 13.3  . Smokeless tobacco: Never Used  Substance Use Topics  .  Alcohol use: Yes    Alcohol/week: 2.0 - 3.0 standard drinks    Types: 2 - 3 Glasses of wine per week    Comment: no liquor  . Drug use: No     Family Hx: The patient's family history includes Breast cancer (age of onset: 44) in her sister; Breast cancer (age of onset: 34) in her mother; Diabetes in her brother, father, mother, and sister; Heart attack in her brother, brother, and father; Parkinson's disease in her sister.  ROS:   Please see the history of present illness.    Review of Systems  Constitutional: Negative.   Respiratory: Negative.   Cardiovascular: Negative.   Gastrointestinal: Negative.   Neurological: Negative.   Psychiatric/Behavioral: Negative.     All other systems reviewed and are negative.   Labs/Other Tests and Data Reviewed:    Recent Labs: No results found for requested  labs within last 8760 hours.   Recent Lipid Panel Lab Results  Component Value Date/Time   CHOL 176 03/03/2018 10:33 AM   TRIG 75 03/03/2018 10:33 AM   HDL 77 03/03/2018 10:33 AM   CHOLHDL 2.3 03/03/2018 10:33 AM   LDLCALC 84 03/03/2018 10:33 AM    Wt Readings from Last 3 Encounters:  03/09/19 197 lb (89.4 kg)  12/02/18 195 lb (88.5 kg)  03/03/18 198 lb 9.6 oz (90.1 kg)     Exam:    Vital Signs:  There were no vitals taken for this visit.    Physical Exam  Constitutional: She is oriented to person, place, and time and well-developed, well-nourished, and in no distress.  HENT:  Head: Normocephalic and atraumatic.  Pulmonary/Chest: Effort normal.  Musculoskeletal:     Cervical back: Normal range of motion.  Neurological: She is alert and oriented to person, place, and time.  Psychiatric: Affect normal.  Nursing note and vitals reviewed.   ASSESSMENT & PLAN:     1. Type 2 diabetes mellitus without complication, without long-term current use of insulin (HCC)  Chronic. She does not wish to come in for labwork at this time. She is reminded that it is necessary to follow  her renal function. She is encouraged to exercise at least 30 minutes five days per week. She is encouraged to come in late June for labwork.   2. Pure hypercholesterolemia  Chronic. I will send in rx refill. She must have brand name Crestor b/c she had severe myalgias with the generic version. She is encouraged to avoid fried foods and to incorporate at least 150 minutes of exercise per week.   3. Class 1 obesity due to excess calories with serious comorbidity and body mass index (BMI) of 32.0 to 32.9 in adult  She is s/p gastric bypass surgery. She is encouraged to strive for BMI less than 29 to decrease cardiac risk. She is congratulated for her regular exercise regimen.     COVID-19 Education: The signs and symptoms of COVID-19 were discussed with the patient and how to seek care for testing (follow up with PCP or arrange E-visit).  The importance of social distancing was discussed today.  Patient Risk:   After full review of this patients clinical status, I feel that they are at least moderate risk at this time.     Medication Adjustments/Labs and Tests Ordered: Current medicines are reviewed at length with the patient today.  Concerns regarding medicines are outlined above.   Tests Ordered: No orders of the defined types were placed in this encounter.   Medication Changes: Meds ordered this encounter  Medications  . CRESTOR 5 MG tablet    Sig: Take 1 tablet (5 mg total) by mouth daily. DAW1    Dispense:  90 tablet    Refill:  1    Disposition:  Follow up in 4 month(s)  Signed, [name], MD

## 2019-06-18 DIAGNOSIS — M67912 Unspecified disorder of synovium and tendon, left shoulder: Secondary | ICD-10-CM | POA: Diagnosis not present

## 2019-06-18 HISTORY — PX: SHOULDER SURGERY: SHX246

## 2019-06-22 ENCOUNTER — Telehealth: Payer: Self-pay | Admitting: Interventional Cardiology

## 2019-06-22 NOTE — Telephone Encounter (Signed)
   [facility] Pre-operative Risk Assessment    Request for surgical clearance:  1. What type of surgery is being performed? Left Shoulder Arthroscopy and Debridement   2. When is this surgery scheduled? 07-02-19   3. What type of clearance is required (medical clearance vs. Pharmacy clearance to hold med vs. Both)? Both  4. Are there any medications that need to be held prior to surgery and how long? 5.  6. Practice name and name of physician performing surgery?  Dr Mayer Camel   7. What is your office phone number [phone]    7.   What is your office fax number. [phone]   8.   Anesthesia type (None, local, MAC, general) ? *General with block   [name] [name] 06/22/2019, 11:44 AM  _________________________________________________________________   (provider comments below)

## 2019-06-22 NOTE — Telephone Encounter (Signed)
   Primary Cardiologist:Henry Nicholes Stairs III, MD  Chart reviewed as part of pre-operative protocol coverage. Because of [name] [name]'s past medical history and time since last visit, he/she will require a follow-up visit in order to better assess preoperative cardiovascular risk. Last office visit was 2015.  Pre-op covering staff: - Please schedule appointment and call patient to inform them. - Please contact requesting surgeon's office via preferred method (i.e, phone, fax) to inform them of need for appointment prior to surgery.  If applicable, this message will also be routed to pharmacy pool and/or primary cardiologist for input on holding anticoagulant/antiplatelet agent as requested below so that this information is available at time of patient's appointment.   Reino Bellis, NP  06/22/2019, 11:51 AM

## 2019-06-24 NOTE — Telephone Encounter (Signed)
I left a message for the patient to return my call about scheduling an appointment.   

## 2019-06-27 NOTE — Telephone Encounter (Signed)
Pt is scheduled to see Dr. Tamala Julian on 06/28/19.. Clearance will be addressed at visit

## 2019-06-27 NOTE — Progress Notes (Signed)
Cardiology Office Note:    Date:  06/28/2019   ID:  [name] G Hasting, DOB 05/17/1950, MRN [id number]  PCP:  [name], MD  Cardiologist:  Sinclair Grooms, MD   Referring MD: [name], MD   Chief Complaint  Patient presents with  . Advice Only    Pre-op Clearance  . Cardiomyopathy    History of Present Illness:    [name] [name] is a 69 y.o. female with a hx of h/o of bilateral breast cancer HER-2 +& ER+, treated with bilateral mastectomy/reconstruction (2014), adjuvant therapy ( Doxorubicin/Cyclophosphamise--> Abraxine x 12 + Trastuzumab (completed 12/03/2012), Trastuzumab (Herceptin) duration 7//2014 thru 06/29/2013 (DC due to LV dysfunction), and finally letrozole (DC 2019).  She is doing well.  She has upcoming left rotator cuff surgery.  She is very active.  She goes greater than 150 minutes of moderate activity per week.  She does daily either stationary biking or treadmill.  She gets greater than 6000 steps per day.  Physical activity is not associated with dyspnea, chest discomfort, palpitations, or leg pain.  She denies orthopnea and PND.  There is no peripheral edema.  During management of  breast cancer 7 years ago, she received Herceptin, cyclophosphamide, and doxorubicin.  She had onset of dyspnea and mild drop in EF to 50%.  Discontinuation of Herceptin led to a rebound in LVEF to 65%.  There are no current symptoms to suggest LV dysfunction.  As noted above she is very physically active on both her treadmill and stationary bicycle and not limited by cardiopulmonary symptoms.  She is going to undergo left rotator cuff surgery on 07/01/2019.  Past Medical History:  Diagnosis Date  . Arthritis    back & hip- R  . Breast cancer ([place]) 04/15/12   left-biopsy  . Breast cancer, right breast ([place]) 05/14/12   right mastectomy  . Bronchitis   . Cancer ([place])    breast  . 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 CHF with unknown LVEF ([place]) 03/09/2013  .  Diabetes mellitus   . Diabetes mellitus without complication ([place])   . GERD (gastroesophageal reflux disease)    pt. experiencing on occasional , h/o og gastroparesis   . Heart murmur   . Hiatal hernia   . Osteopenia   . Pneumonia    treated as an outpt.   . Sinus problem   . Sleep apnea    sleep minimal concern- " along time ago"    Past Surgical History:  Procedure Laterality Date  . AXILLARY SENTINEL NODE BIOPSY Left 05/14/2012   Procedure: AXILLARY SENTINEL NODE BIOPSY;  Surgeon: Adin Hector, MD;  Location: [place];  Service: General;  Laterality: Left;  . BREAST RECONSTRUCTION WITH PLACEMENT OF TISSUE EXPANDER AND FLEX HD (ACELLULAR HYDRATED DERMIS) Bilateral 05/14/2012   Procedure: BREAST RECONSTRUCTION WITH PLACEMENT OF TISSUE EXPANDER AND FLEX HD (ACELLULAR HYDRATED DERMIS);  Surgeon: Crissie Reese, MD;  Location: [place];  Service: Plastics;  Laterality: Bilateral;  . BUNIONECTOMY Bilateral   . CESAREAN SECTION  1981, 1983  . FOOT SURGERY    . GASTRIC BYPASS     1st surgery- at [facility], then an emergent surgery here  [facility]- for perforation of stomach & then a surgery for debridement   . PORT-A-CATH REMOVAL Right 09/12/2013   Procedure: REMOVAL PORT-A-CATH;  Surgeon: Adin Hector, MD;  Location: [place];  Service: General;  Laterality: Right;  . PORTACATH PLACEMENT N/A 05/14/2012   Procedure: INSERTION PORT-A-CATH;  Surgeon: Adin Hector, MD;  Location: [place];  Service: General;  Laterality: N/A;  . TONSILLECTOMY    . TOTAL HIP ARTHROPLASTY Right 03/02/2017  . TOTAL HIP ARTHROPLASTY Right 03/02/2017   Procedure: RIGHT TOTAL HIP ARTHROPLASTY ANTERIOR APPROACH;  Surgeon: Frederik Pear, MD;  Location: [place];  Service: Orthopedics;  Laterality: Right;  . TOTAL MASTECTOMY Bilateral 05/14/2012   Procedure: TOTAL MASTECTOMY;  Surgeon: Adin Hector, MD;  Location: [place];  Service: General;  Laterality: Bilateral;  . tummy tuck  1999  . WOUND DEBRIDEMENT   01/14/2011   Procedure: DEBRIDEMENT ABDOMINAL WOUND;  Surgeon: Adin Hector, MD;  Location: [place];  Service: General;  Laterality: N/A;  wound debridement and debridement on the abdomen    Current Medications: Current Meds  Medication Sig  . Ascorbic Acid (VITAMIN C) 500 MG tablet Take 500 mg by mouth daily.    . Cholecalciferol (VITAMIN D PO) Take 1 tablet by mouth daily.  . CRESTOR 5 MG tablet Take 1 tablet (5 mg total) by mouth daily. DAW1  . Dexlansoprazole (DEXILANT) 30 MG capsule Take 30 mg by mouth daily.  Marland Kitchen gabapentin (NEURONTIN) 100 MG capsule LIMITS 2 TO 3 CAPSULES EACH EVENING FOR 3 DAYS THEN 2 TO 3 CAPS TWICE DAILY FO 3 DAYS THEN 2 TO 3 CAPS 3 TIMES DAILY AND CONTINUE IF TOLERAT  . glucose blood (CONTOUR NEXT TEST) test strip Use as instructed TO TEST BLOOD SUGARS ONCE DAILY. DX: E11.9  . JANUVIA 100 MG tablet Take 1 tablet by mouth once daily  . KRILL OIL PO Take 1 capsule by mouth daily.   . magnesium oxide (MAG-OX) 400 MG tablet Take 400 mg by mouth daily.  . Menthol, Topical Analgesic, (BLUE-EMU MAXIMUM STRENGTH EX) Apply 1 application topically daily.  . Multiple Vitamin (MULTIVITAMIN PO) Take 1 tablet by mouth daily.   . Probiotic Product (PROBIOTIC PO) Take 1 capsule by mouth daily.     Allergies:   Darvon and Penicillins   Social History   Socioeconomic History  . Marital status: Married    Spouse name: Joe  . Number of children: 2  . Years of education: 66  . Highest education level: Not on file  Occupational History  . 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 [place], [place]  . Occupation: retired  Tobacco Use  . Smoking status: Former Smoker    Quit date: 02/25/2006    Years since quitting: 13.3  . Smokeless tobacco: Never Used  Substance and Sexual Activity  . Alcohol use: Yes    Alcohol/week: 2.0 - 3.0 standard drinks    Types: 2 - 3 Glasses of wine per week    Comment: no liquor  . Drug use: No  . Sexual activity: Yes     Partners: Male    Birth control/protection: Post-menopausal  Other Topics Concern  . Not on file  Social History Narrative  . Not on file   Social Determinants of Health   Financial Resource Strain: Low Risk   . Difficulty of Paying Living Expenses: Not hard at all  Food Insecurity: No Food Insecurity  . Worried About Charity fundraiser in the Last Year: Never true  . Ran Out of Food in the Last Year: Never true  Transportation Needs: No Transportation Needs  . Lack of Transportation (Medical): No  . Lack of Transportation (Non-Medical): No  Physical Activity: Sufficiently Active  . Days of Exercise per Week: 5 days  .  Minutes of Exercise per Session: 60 min  Stress: No Stress Concern Present  . Feeling of Stress : Not at all  Social Connections:   . Frequency of Communication with Friends and Family:   . Frequency of Social Gatherings with Friends and Family:   . Attends Religious Services:   . Active Member of Clubs or Organizations:   . Attends Archivist Meetings:   Marland Kitchen Marital Status:      Family History: The patient's family history includes Breast cancer (age of onset: 16) in her sister; Breast cancer (age of onset: 41) in her mother; Diabetes in her brother, father, mother, and sister; Heart attack in her brother, brother, and father; Parkinson's disease in her sister.  1 older brother died of myocardial infarction.,  Father also had myocardial infarction.  ROS:   Please see the history of present illness.    Stable from standpoint of chest discomfort, palpitations, and other cardiac symptoms.  Having terrible difficulty with left arm being unable to care for herself because of the associated pain.  All other systems reviewed and are negative.  EKGs/Labs/Other Studies Reviewed:    The following studies were reviewed today: No new cardiac data or evaluation.  EKG:  EKG The prior tracing from 2018 shows ?biatrial abnormality..  The tracing performed today  reveals left atrial abnormality but otherwise normal in appearance.  Voltage is slightly decreased.  There is poor R wave progression.  There is no significant change when compared to tracings from 2015 and 2018  Chest CT Jul 15, 2013: IMPRESSION: 1.  No evidence of pulmonary embolism. 2. Age advanced coronary artery atherosclerosis. Recommend assessment of coronary risk factors and consideration of medical therapy. 3. Esophageal air fluid level suggests dysmotility or gastroesophageal reflux.   Recent Labs: No results found for requested labs within last 8760 hours.  Recent Lipid Panel    Component Value Date/Time   CHOL 176 03/03/2018 1033   TRIG 75 03/03/2018 1033   HDL 77 03/03/2018 1033   CHOLHDL 2.3 03/03/2018 1033   LDLCALC 84 03/03/2018 1033    Physical Exam:    VS:  BP 126/72   Pulse 81   Ht '5\' 4"'  (1.626 m)   Wt 201 lb 9.6 oz (91.4 kg)   SpO2 98%   BMI 34.60 kg/m     Wt Readings from Last 3 Encounters:  06/28/19 201 lb 9.6 oz (91.4 kg)  03/09/19 197 lb (89.4 kg)  12/02/18 195 lb (88.5 kg)     GEN: Moderate obesity. No acute distress HEENT: Normal NECK: No JVD. LYMPHATICS: No lymphadenopathy CARDIAC:  RRR with 1-2 over 6 right upper sternal systolic murmur, no gallop, or edema. VASCULAR:  Normal Pulses. No bruits. RESPIRATORY:  Clear to auscultation without rales, wheezing or rhonchi  ABDOMEN: Soft, non-tender, non-distended, No pulsatile mass, MUSCULOSKELETAL: No deformity  SKIN: Warm and dry NEUROLOGIC:  Alert and oriented x 3 PSYCHIATRIC:  Normal affect   ASSESSMENT:    1. Chemotherapy induced cardiomyopathy ([place])   2. Pre-operative cardiovascular examination   3. Mixed hyperlipidemia   4. Type 2 diabetes mellitus without complication, without long-term current use of insulin ([place])   5. Coronary artery calcification   6. Malignant neoplasm of overlapping sites of left breast in female, estrogen receptor positive ([place])   7. Educated about  COVID-19 virus infection    PLAN:    In order of problems listed above:  1. She had a dynamic change in LVEF when undergoing chemotherapy  for breast cancer in 2014/2015 timeframe.  EF dropped to 50%, Herceptin was discontinued, and repeat echocardiogram several months later revealed EF to be back in the 55 to 60% range.  She has no current symptoms to suggest heart failure or LV dysfunction. 2. She is cleared for the upcoming rotator cuff surgery without reservations.  No further testing is necessary. 3. She is aggressively treating lipids with an LDL of 80 in January of 1 year ago primarily because of comorbidities including diabetes.  Continue Crestor 5 mg/day. 4. Hemoglobin A1c was 6.9.  Continue Januvia, exercise, and weight control. 5. Review of her database identified "age advanced coronary calcification" from a noncoronary CT in 2015.  This finding is in the setting of no symptoms from coronary standpoint.  Primary preventive measures are being taken which include exercise, weight control, management of diabetes, blood pressure control and lipid management.  Continue observation.  No current symptoms of angina pectoris. 6. Has done well.  She is now off letrozole and there is no evidence of recurrence. 7. She has been vaccinated and is practicing social distancing.  Overall education and awareness concerning secondary risk prevention was discussed in detail: LDL less than 70, hemoglobin A1c less than 7, blood pressure target less than 130/80 mmHg, >150 minutes of moderate aerobic activity per week, avoidance of smoking, weight control (via diet and exercise), and continued surveillance/management of/for obstructive sleep apnea.    Medication Adjustments/Labs and Tests Ordered: Current medicines are reviewed at length with the patient today.  Concerns regarding medicines are outlined above.  Orders Placed This Encounter  Procedures  . EKG 12-Lead   No orders of the defined types were  placed in this encounter.   Patient Instructions  Medication Instructions:  Your physician recommends that you continue on your current medications as directed. Please refer to the Current Medication list given to you today.  *If you need a refill on your cardiac medications before your next appointment, please call your pharmacy*   Lab Work: None If you have labs (blood work) drawn today and your tests are completely normal, you will receive your results only by: Marland Kitchen MyChart Message (if you have MyChart) OR . A paper copy in the mail If you have any lab test that is abnormal or we need to change your treatment, we will call you to review the results.   Testing/Procedures: None   Follow-Up: At [facility], you and your health needs are our priority.  As part of our continuing mission to provide you with exceptional heart care, we have created designated Provider Care Teams.  These Care Teams include your primary Cardiologist (physician) and Advanced Practice Providers (APPs -  Physician Assistants and Nurse Practitioners) who all work together to provide you with the care you need, when you need it.  We recommend signing up for the patient portal called "MyChart".  Sign up information is provided on this After Visit Summary.  MyChart is used to connect with patients for Virtual Visits (Telemedicine).  Patients are able to view lab/test results, encounter notes, upcoming appointments, etc.  Non-urgent messages can be sent to your provider as well.   To learn more about what you can do with MyChart, go to [email or web address].    Your next appointment:   As needed  The format for your next appointment:   In Person  Provider:   You may see Sinclair Grooms, MD or one of the following Advanced Practice Providers on your designated Care  Team:    Truitt Merle, NP  Cecilie Kicks, NP  Kathyrn Drown, NP    Other Instructions      Signed, Sinclair Grooms, MD    06/28/2019 11:23 AM    [place]

## 2019-06-28 ENCOUNTER — Other Ambulatory Visit: Payer: Self-pay

## 2019-06-28 ENCOUNTER — Encounter: Payer: Self-pay | Admitting: Interventional Cardiology

## 2019-06-28 ENCOUNTER — Other Ambulatory Visit: Payer: Medicare Other

## 2019-06-28 ENCOUNTER — Ambulatory Visit (INDEPENDENT_AMBULATORY_CARE_PROVIDER_SITE_OTHER): Payer: Medicare Other | Admitting: Interventional Cardiology

## 2019-06-28 VITALS — BP 126/72 | HR 81 | Ht 64.0 in | Wt 201.6 lb

## 2019-06-28 DIAGNOSIS — I427 Cardiomyopathy due to drug and external agent: Secondary | ICD-10-CM

## 2019-06-28 DIAGNOSIS — E119 Type 2 diabetes mellitus without complications: Secondary | ICD-10-CM

## 2019-06-28 DIAGNOSIS — Z7189 Other specified counseling: Secondary | ICD-10-CM | POA: Diagnosis not present

## 2019-06-28 DIAGNOSIS — I2584 Coronary atherosclerosis due to calcified coronary lesion: Secondary | ICD-10-CM | POA: Diagnosis not present

## 2019-06-28 DIAGNOSIS — Z01810 Encounter for preprocedural cardiovascular examination: Secondary | ICD-10-CM | POA: Diagnosis not present

## 2019-06-28 DIAGNOSIS — T451X5A Adverse effect of antineoplastic and immunosuppressive drugs, initial encounter: Secondary | ICD-10-CM | POA: Diagnosis not present

## 2019-06-28 DIAGNOSIS — Z170 Estrogen receptor positive status [ER+]: Secondary | ICD-10-CM | POA: Diagnosis not present

## 2019-06-28 DIAGNOSIS — C50812 Malignant neoplasm of overlapping sites of left female breast: Secondary | ICD-10-CM | POA: Diagnosis not present

## 2019-06-28 DIAGNOSIS — E782 Mixed hyperlipidemia: Secondary | ICD-10-CM | POA: Diagnosis not present

## 2019-06-28 DIAGNOSIS — I2510 Atherosclerotic heart disease of native coronary artery without angina pectoris: Secondary | ICD-10-CM

## 2019-06-28 LAB — CBC WITH DIFFERENTIAL/PLATELET
Basophils Absolute: 0 10*3/uL (ref 0.0–0.2)
Basos: 1 %
EOS (ABSOLUTE): 0.1 10*3/uL (ref 0.0–0.4)
Eos: 2 %
Hematocrit: 40.9 % (ref 34.0–46.6)
Hemoglobin: 14 g/dL (ref 11.1–15.9)
Immature Grans (Abs): 0 10*3/uL (ref 0.0–0.1)
Immature Granulocytes: 0 %
Lymphocytes Absolute: 1.8 10*3/uL (ref 0.7–3.1)
Lymphs: 35 %
MCH: 30.3 pg (ref 26.6–33.0)
MCHC: 34.2 g/dL (ref 31.5–35.7)
MCV: 89 fL (ref 79–97)
Monocytes Absolute: 0.6 10*3/uL (ref 0.1–0.9)
Monocytes: 12 %
Neutrophils Absolute: 2.6 10*3/uL (ref 1.4–7.0)
Neutrophils: 50 %
Platelets: 295 10*3/uL (ref 150–450)
RBC: 4.62 x10E6/uL (ref 3.77–5.28)
RDW: 12.2 % (ref 11.7–15.4)
WBC: 5.2 10*3/uL (ref 3.4–10.8)

## 2019-06-28 NOTE — Patient Instructions (Signed)
Medication Instructions:  Your physician recommends that you continue on your current medications as directed. Please refer to the Current Medication list given to you today.  *If you need a refill on your cardiac medications before your next appointment, please call your pharmacy*   Lab Work: None If you have labs (blood work) drawn today and your tests are completely normal, you will receive your results only by: . MyChart Message (if you have MyChart) OR . A paper copy in the mail If you have any lab test that is abnormal or we need to change your treatment, we will call you to review the results.   Testing/Procedures: None   Follow-Up: At [facility], you and your health needs are our priority.  As part of our continuing mission to provide you with exceptional heart care, we have created designated Provider Care Teams.  These Care Teams include your primary Cardiologist (physician) and Advanced Practice Providers (APPs -  Physician Assistants and Nurse Practitioners) who all work together to provide you with the care you need, when you need it.  We recommend signing up for the patient portal called "MyChart".  Sign up information is provided on this After Visit Summary.  MyChart is used to connect with patients for Virtual Visits (Telemedicine).  Patients are able to view lab/test results, encounter notes, upcoming appointments, etc.  Non-urgent messages can be sent to your provider as well.   To learn more about what you can do with MyChart, go to [email or web address].    Your next appointment:   As needed  The format for your next appointment:   In Person  Provider:   You may see Henry W Smith III, MD or one of the following Advanced Practice Providers on your designated Care Team:    Lori Gerhardt, NP  Laura Ingold, NP  Jill McDaniel, NP    Other Instructions   

## 2019-06-29 ENCOUNTER — Telehealth: Payer: Self-pay

## 2019-06-29 LAB — CMP14+EGFR
ALT: 15 IU/L (ref 0–32)
AST: 15 IU/L (ref 0–40)
Albumin/Globulin Ratio: 1.9 (ref 1.2–2.2)
Albumin: 4.3 g/dL (ref 3.8–4.8)
Alkaline Phosphatase: 104 IU/L (ref 39–117)
BUN/Creatinine Ratio: 8 — ABNORMAL LOW (ref 12–28)
BUN: 6 mg/dL — ABNORMAL LOW (ref 8–27)
Bilirubin Total: 0.3 mg/dL (ref 0.0–1.2)
CO2: 28 mmol/L (ref 20–29)
Calcium: 10 mg/dL (ref 8.7–10.3)
Chloride: 103 mmol/L (ref 96–106)
Creatinine, Ser: 0.74 mg/dL (ref 0.57–1.00)
GFR calc Af Amer: 96 mL/min/{1.73_m2} (ref >59)
GFR calc non Af Amer: 83 mL/min/{1.73_m2} (ref >59)
Globulin, Total: 2.3 g/dL (ref 1.5–4.5)
Glucose: 109 mg/dL — ABNORMAL HIGH (ref 65–99)
Potassium: 4.3 mmol/L (ref 3.5–5.2)
Sodium: 145 mmol/L — ABNORMAL HIGH (ref 134–144)
Total Protein: 6.6 g/dL (ref 6.0–8.5)

## 2019-06-29 LAB — LIPID PANEL
Chol/HDL Ratio: 2.5 ratio (ref 0.0–4.4)
Cholesterol, Total: 167 mg/dL (ref 100–199)
HDL: 67 mg/dL (ref >39)
LDL Chol Calc (NIH): 80 mg/dL (ref 0–99)
Triglycerides: 112 mg/dL (ref 0–149)
VLDL Cholesterol Cal: 20 mg/dL (ref 5–40)

## 2019-06-29 LAB — HEMOGLOBIN A1C
Est. average glucose Bld gHb Est-mCnc: 166 mg/dL
Hgb A1c MFr Bld: 7.4 % — ABNORMAL HIGH (ref 4.8–5.6)

## 2019-06-29 NOTE — Telephone Encounter (Signed)
The pt was notified that the office contacted the [facility] and [facility] and was told that they have no information on who told the pt that her surgery might be canceled because of congestive heart failure and sleep apenea because the pt called Dr. Baird Cancer office wanting to know what was going on and that it was some wrong information sent to [facility].

## 2019-06-30 ENCOUNTER — Ambulatory Visit: Payer: Medicare Other | Admitting: Internal Medicine

## 2019-07-01 DIAGNOSIS — M94212 Chondromalacia, left shoulder: Secondary | ICD-10-CM | POA: Diagnosis not present

## 2019-07-01 DIAGNOSIS — M7532 Calcific tendinitis of left shoulder: Secondary | ICD-10-CM | POA: Diagnosis not present

## 2019-07-01 DIAGNOSIS — M24112 Other articular cartilage disorders, left shoulder: Secondary | ICD-10-CM | POA: Diagnosis not present

## 2019-07-01 DIAGNOSIS — G8918 Other acute postprocedural pain: Secondary | ICD-10-CM | POA: Diagnosis not present

## 2019-07-05 ENCOUNTER — Telehealth: Payer: Self-pay

## 2019-07-05 NOTE — Telephone Encounter (Signed)
-----   Message from [name], MD sent at 07/03/2019  8:27 PM EDT ----- Your hba1c has gone up to 7.4, up from 6.9. I would like to recheck in 3 months, if it remains above 7.0, we need to add another medication to your current regimen. Have you been taking Januvia daily? Your kidney function is stable. Your sodium level is elevated, be sure to stay well hydrated. Your LDL is 80, this should be less than70. As you resume your regular exercise regimen, this will improve. How are you feeling after your surgery? Is your pain controlled?

## 2019-07-19 DIAGNOSIS — M25612 Stiffness of left shoulder, not elsewhere classified: Secondary | ICD-10-CM | POA: Diagnosis not present

## 2019-07-19 DIAGNOSIS — M6281 Muscle weakness (generalized): Secondary | ICD-10-CM | POA: Diagnosis not present

## 2019-07-25 DIAGNOSIS — M25612 Stiffness of left shoulder, not elsewhere classified: Secondary | ICD-10-CM | POA: Diagnosis not present

## 2019-07-25 DIAGNOSIS — M6281 Muscle weakness (generalized): Secondary | ICD-10-CM | POA: Diagnosis not present

## 2019-07-27 DIAGNOSIS — M25612 Stiffness of left shoulder, not elsewhere classified: Secondary | ICD-10-CM | POA: Diagnosis not present

## 2019-07-27 DIAGNOSIS — M6281 Muscle weakness (generalized): Secondary | ICD-10-CM | POA: Diagnosis not present

## 2019-07-28 ENCOUNTER — Ambulatory Visit: Payer: Self-pay

## 2019-07-28 NOTE — Chronic Care Management (AMB) (Signed)
  Chronic Care Management   Outreach Note  07/28/2019 Name: [name] G Pepitone MRN: [id number] DOB: [birth year]  Referred by: [name], MD Reason for referral : Care Coordination   Chart review performed to note patient still enrolled in care management program after multiple failed attempts to engage patient. SW to close patient to embedded care management at this time.  Follow Up Plan: CCM enrollment status changed to "previously enrolled". Case closed to case management services in primary care home.   Daneen Schick, BSW, CDP Social Worker, Certified Dementia Practitioner [place] [facility] [place] [facility] [phone]

## 2019-08-01 DIAGNOSIS — M25612 Stiffness of left shoulder, not elsewhere classified: Secondary | ICD-10-CM | POA: Diagnosis not present

## 2019-08-01 DIAGNOSIS — M6281 Muscle weakness (generalized): Secondary | ICD-10-CM | POA: Diagnosis not present

## 2019-08-03 DIAGNOSIS — M6281 Muscle weakness (generalized): Secondary | ICD-10-CM | POA: Diagnosis not present

## 2019-08-03 DIAGNOSIS — M25612 Stiffness of left shoulder, not elsewhere classified: Secondary | ICD-10-CM | POA: Diagnosis not present

## 2019-08-04 ENCOUNTER — Other Ambulatory Visit: Payer: Self-pay

## 2019-08-04 DIAGNOSIS — H01002 Unspecified blepharitis right lower eyelid: Secondary | ICD-10-CM | POA: Diagnosis not present

## 2019-08-04 DIAGNOSIS — H00012 Hordeolum externum right lower eyelid: Secondary | ICD-10-CM | POA: Diagnosis not present

## 2019-08-04 DIAGNOSIS — E119 Type 2 diabetes mellitus without complications: Secondary | ICD-10-CM | POA: Diagnosis not present

## 2019-08-04 DIAGNOSIS — H00011 Hordeolum externum right upper eyelid: Secondary | ICD-10-CM | POA: Diagnosis not present

## 2019-08-04 DIAGNOSIS — H01005 Unspecified blepharitis left lower eyelid: Secondary | ICD-10-CM | POA: Diagnosis not present

## 2019-08-04 LAB — HM DIABETES EYE EXAM

## 2019-08-04 MED ORDER — CRESTOR 5 MG PO TABS: 5.0000 mg | ORAL_TABLET | Freq: Every day | ORAL | 2 refills | Status: DC

## 2019-08-16 ENCOUNTER — Other Ambulatory Visit: Payer: Self-pay

## 2019-08-16 ENCOUNTER — Ambulatory Visit (INDEPENDENT_AMBULATORY_CARE_PROVIDER_SITE_OTHER): Payer: Medicare Other

## 2019-08-16 VITALS — BP 126/84 | HR 84 | Temp 98.1°F | Ht 64.0 in | Wt 191.6 lb

## 2019-08-16 DIAGNOSIS — E1165 Type 2 diabetes mellitus with hyperglycemia: Secondary | ICD-10-CM | POA: Diagnosis not present

## 2019-08-16 DIAGNOSIS — E119 Type 2 diabetes mellitus without complications: Secondary | ICD-10-CM

## 2019-08-16 LAB — POCT URINALYSIS DIPSTICK
Bilirubin, UA: NEGATIVE
Blood, UA: NEGATIVE
Glucose, UA: NEGATIVE
Ketones, UA: NEGATIVE
Nitrite, UA: NEGATIVE
Protein, UA: NEGATIVE
Spec Grav, UA: 1.01 (ref 1.010–1.025)
Urobilinogen, UA: 0.2 E.U./dL
pH, UA: 6 (ref 5.0–8.0)

## 2019-08-16 LAB — POCT UA - MICROALBUMIN
Albumin/Creatinine Ratio, Urine, POC: 30
Creatinine, POC: 50 mg/dL
Microalbumin Ur, POC: 10 mg/L

## 2019-08-16 NOTE — Progress Notes (Signed)
Pt her today to start Rybelsus, samples given. Per RS wait till next visit to send in Prescription

## 2019-08-29 ENCOUNTER — Ambulatory Visit (INDEPENDENT_AMBULATORY_CARE_PROVIDER_SITE_OTHER): Payer: Medicare Other | Admitting: Internal Medicine

## 2019-08-29 ENCOUNTER — Encounter: Payer: Self-pay | Admitting: Internal Medicine

## 2019-08-29 ENCOUNTER — Other Ambulatory Visit: Payer: Self-pay

## 2019-08-29 VITALS — BP 124/72 | HR 90 | Temp 98.5°F | Ht 64.0 in | Wt 189.2 lb

## 2019-08-29 DIAGNOSIS — I2510 Atherosclerotic heart disease of native coronary artery without angina pectoris: Secondary | ICD-10-CM

## 2019-08-29 DIAGNOSIS — E119 Type 2 diabetes mellitus without complications: Secondary | ICD-10-CM | POA: Diagnosis not present

## 2019-08-29 DIAGNOSIS — E6609 Other obesity due to excess calories: Secondary | ICD-10-CM | POA: Diagnosis not present

## 2019-08-29 DIAGNOSIS — D481 Neoplasm of uncertain behavior of connective and other soft tissue: Secondary | ICD-10-CM | POA: Diagnosis not present

## 2019-08-29 DIAGNOSIS — Z6832 Body mass index (BMI) 32.0-32.9, adult: Secondary | ICD-10-CM

## 2019-08-29 DIAGNOSIS — K3184 Gastroparesis: Secondary | ICD-10-CM

## 2019-08-29 DIAGNOSIS — I2584 Coronary atherosclerosis due to calcified coronary lesion: Secondary | ICD-10-CM

## 2019-08-29 MED ORDER — RYBELSUS 7 MG PO TABS: 7.0000 mg | ORAL_TABLET | Freq: Every morning | ORAL | 1 refills | Status: DC

## 2019-08-29 NOTE — Patient Instructions (Signed)
Diabetes Mellitus and Exercise Exercising regularly is important for your overall health, especially when you have diabetes (diabetes mellitus). Exercising is not only about losing weight. It has many other health benefits, such as increasing muscle strength and bone density and reducing body fat and stress. This leads to improved fitness, flexibility, and endurance, all of which result in better overall health. Exercise has additional benefits for people with diabetes, including:  Reducing appetite.  Helping to lower and control blood glucose.  Lowering blood pressure.  Helping to control amounts of fatty substances (lipids) in the blood, such as cholesterol and triglycerides.  Helping the body to respond better to insulin (improving insulin sensitivity).  Reducing how much insulin the body needs.  Decreasing the risk for heart disease by: ? Lowering cholesterol and triglyceride levels. ? Increasing the levels of good cholesterol. ? Lowering blood glucose levels. What is my activity plan? Your health care provider or certified diabetes educator can help you make a plan for the type and frequency of exercise (activity plan) that works for you. Make sure that you:  Do at least 150 minutes of moderate-intensity or vigorous-intensity exercise each week. This could be brisk walking, biking, or water aerobics. ? Do stretching and strength exercises, such as yoga or weightlifting, at least 2 times a week. ? Spread out your activity over at least 3 days of the week.  Get some form of physical activity every day. ? Do not go more than 2 days in a row without some kind of physical activity. ? Avoid being inactive for more than 30 minutes at a time. Take frequent breaks to walk or stretch.  Choose a type of exercise or activity that you enjoy, and set realistic goals.  Start slowly, and gradually increase the intensity of your exercise over time. What do I need to know about managing my  diabetes?   Check your blood glucose before and after exercising. ? If your blood glucose is 240 mg/dL (13.3 mmol/L) or higher before you exercise, check your urine for ketones. If you have ketones in your urine, do not exercise until your blood glucose returns to normal. ? If your blood glucose is 100 mg/dL (5.6 mmol/L) or lower, eat a snack containing 15-20 grams of carbohydrate. Check your blood glucose 15 minutes after the snack to make sure that your level is above 100 mg/dL (5.6 mmol/L) before you start your exercise.  Know the symptoms of low blood glucose (hypoglycemia) and how to treat it. Your risk for hypoglycemia increases during and after exercise. Common symptoms of hypoglycemia can include: ? Hunger. ? Anxiety. ? Sweating and feeling clammy. ? Confusion. ? Dizziness or feeling light-headed. ? Increased heart rate or palpitations. ? Blurry vision. ? Tingling or numbness around the mouth, lips, or tongue. ? Tremors or shakes. ? Irritability.  Keep a rapid-acting carbohydrate snack available before, during, and after exercise to help prevent or treat hypoglycemia.  Avoid injecting insulin into areas of the body that are going to be exercised. For example, avoid injecting insulin into: ? The arms, when playing tennis. ? The legs, when jogging.  Keep records of your exercise habits. Doing this can help you and your health care provider adjust your diabetes management plan as needed. Write down: ? Food that you eat before and after you exercise. ? Blood glucose levels before and after you exercise. ? The type and amount of exercise you have done. ? When your insulin is expected to peak, if you use   insulin. Avoid exercising at times when your insulin is peaking.  When you start a new exercise or activity, work with your health care provider to make sure the activity is safe for you, and to adjust your insulin, medicines, or food intake as needed.  Drink plenty of water while  you exercise to prevent dehydration or heat stroke. Drink enough fluid to keep your urine clear or pale yellow. Summary  Exercising regularly is important for your overall health, especially when you have diabetes (diabetes mellitus).  Exercising has many health benefits, such as increasing muscle strength and bone density and reducing body fat and stress.  Your health care provider or certified diabetes educator can help you make a plan for the type and frequency of exercise (activity plan) that works for you.  When you start a new exercise or activity, work with your health care provider to make sure the activity is safe for you, and to adjust your insulin, medicines, or food intake as needed. This information is not intended to replace advice given to you by your health care provider. Make sure you discuss any questions you have with your health care provider. Document Revised: 08/28/2016 Document Reviewed: 07/16/2015 Elsevier Patient Education  2020 [employer]  

## 2019-08-29 NOTE — Progress Notes (Signed)
This visit occurred during the SARS-CoV-2 public health emergency.  Safety protocols were in place, including screening questions prior to the visit, additional usage of staff PPE, and extensive cleaning of exam room while observing appropriate contact time as indicated for disinfecting solutions.  Subjective:     Patient ID: [name] [name] , female    DOB: May 21, 1950 , 69 y.o.   MRN: [id number]   Chief Complaint  Patient presents with  . Medication Management    Rybelsus f/u    HPI  She presents today for diabetes check. She has been taking Rybelsus 3mg  daily. She has not had any issues with the medication.  She felt that it did help to suppress her appetite.  She did not have any side effects.     Past Medical History:  Diagnosis Date  . Arthritis    back & hip- R  . Breast cancer ([place]) 04/15/12   left-biopsy  . Breast cancer, right breast ([place]) 05/14/12   right mastectomy  . Bronchitis   . Cancer ([place])    breast  . 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 CHF with unknown LVEF ([place]) 03/09/2013  . Diabetes mellitus   . Diabetes mellitus without complication ([place])   . GERD (gastroesophageal reflux disease)    pt. experiencing on occasional , h/o og gastroparesis   . Heart murmur   . Hiatal hernia   . Osteopenia   . Pneumonia    treated as an outpt.   . Sinus problem   . Sleep apnea    sleep minimal concern- " along time ago"     Family History  Problem Relation Age of Onset  . Heart attack Father   . Diabetes Father   . Heart attack Brother   . Diabetes Brother   . Diabetes Sister   . Breast cancer Sister 50  . Diabetes Mother   . Breast cancer Mother 66  . Parkinson's disease Sister   . Heart attack Brother      Current Outpatient Medications:  .  Ascorbic Acid (VITAMIN C) 500 MG tablet, Take 500 mg by mouth daily.  , Disp: , Rfl:  .  Cholecalciferol (VITAMIN D PO), Take 1 tablet by mouth daily., Disp: , Rfl:  .  CRESTOR 5 MG tablet, Take 1  tablet (5 mg total) by mouth daily. DAW1, Disp: 90 tablet, Rfl: 2 .  Dexlansoprazole (DEXILANT) 30 MG capsule, Take 30 mg by mouth daily., Disp: , Rfl:  .  ELDERBERRY PO, Take by mouth., Disp: , Rfl:  .  GARLIC PO, Take by mouth., Disp: , Rfl:  .  glucose blood (CONTOUR NEXT TEST) test strip, Use as instructed TO TEST BLOOD SUGARS ONCE DAILY. DX: E11.9, Disp: 100 each, Rfl: 0 .  KRILL OIL PO, Take 1 capsule by mouth daily. , Disp: , Rfl:  .  Magnesium Oxide 250 MG TABS, Take 400 mg by mouth daily. , Disp: , Rfl:  .  Menthol, Topical Analgesic, (BLUE-EMU MAXIMUM STRENGTH EX), Apply 1 application topically daily., Disp: , Rfl:  .  Multiple Vitamin (MULTIVITAMIN PO), Take 1 tablet by mouth daily. , Disp: , Rfl:  .  Probiotic Product (PROBIOTIC PO), Take 1 capsule by mouth daily., Disp: , Rfl:  .  Semaglutide (RYBELSUS) 7 MG TABS, Take 7 mg by mouth in the morning., Disp: 30 tablet, Rfl: 1   Allergies  Allergen Reactions  . Darvon Hives and Itching  . Penicillins Hives and Itching    Has patient  had a PCN reaction causing immediate rash, facial/tongue/throat swelling, SOB or lightheadedness with hypotension: No Has patient had a PCN reaction causing severe rash involving mucus membranes or skin necrosis: No Has patient had a PCN reaction that required hospitalization: No Has patient had a PCN reaction occurring within the last 10 years: No If all of the above answers are "NO", then may proceed with Cephalosporin use.      Review of Systems  Constitutional: Negative.   Respiratory: Negative.   Cardiovascular: Negative.   Gastrointestinal: Negative.   Neurological: Negative.   Psychiatric/Behavioral: Negative.      Today's Vitals   08/29/19 1452  BP: 124/72  Pulse: 90  Temp: 98.5 F (36.9 C)  TempSrc: Oral  Weight: 189 lb 3.2 oz (85.8 kg)  Height: 5\' 4"  (1.626 m)  PainSc: 0-No pain   Body mass index is 32.48 kg/m.   Wt Readings from Last 3 Encounters:  08/29/19 189 lb 3.2 oz  (85.8 kg)  08/16/19 191 lb 9.6 oz (86.9 kg)  06/28/19 201 lb 9.6 oz (91.4 kg)     Objective:  Physical Exam Vitals and nursing note reviewed.  Constitutional:      Appearance: Normal appearance.  HENT:     Head: Normocephalic and atraumatic.  Cardiovascular:     Rate and Rhythm: Normal rate and regular rhythm.     Heart sounds: Normal heart sounds.  Pulmonary:     Effort: Pulmonary effort is normal.     Breath sounds: Normal breath sounds.  Skin:    General: Skin is warm.  Neurological:     General: No focal deficit present.     Mental Status: She is alert.  Psychiatric:        Mood and Affect: Mood normal.        Behavior: Behavior normal.         Assessment And Plan:     1. Type 2 diabetes mellitus without complication, without long-term current use of insulin (HCC)  Chronic. She will continue with Rybelsus 3mg  daily until she runs out of her current supply.  She will then increase to 2 tablets 3mg  Rybelsus, prior to starting the 7mg  dosage. She will contact me in a week or two to let me know how she is feeling. She will rto in latter part of September for her next diabetes check.   2. Class 1 obesity due to excess calories with serious comorbidity and body mass index (BMI) of 32.0 to 32.9 in adult  She is congratulated on her 12 pound weight loss since May 2021. She will continue with NutriSystem to help her achieve her weight loss goal.   3. Gastroparesis  Chronic. Pt advised that she may not get to target dose of 14mg  due to her gastroparesis. She will let me know if she experiences an exacerbation of her sx.    [name], MD   I, [name], MD, have reviewed all documentation for this visit. The documentation on 08/29/19 for the exam, diagnosis, procedures, and orders are all accurate and complete.  THE PATIENT IS ENCOURAGED TO PRACTICE SOCIAL DISTANCING DUE TO THE COVID-19 PANDEMIC.

## 2019-08-30 ENCOUNTER — Encounter: Payer: Self-pay | Admitting: Internal Medicine

## 2019-09-01 ENCOUNTER — Other Ambulatory Visit: Payer: Self-pay | Admitting: Plastic Surgery

## 2019-09-01 DIAGNOSIS — D481 Neoplasm of uncertain behavior of connective and other soft tissue: Secondary | ICD-10-CM | POA: Diagnosis not present

## 2019-09-01 DIAGNOSIS — L720 Epidermal cyst: Secondary | ICD-10-CM | POA: Diagnosis not present

## 2019-09-14 ENCOUNTER — Ambulatory Visit: Payer: Self-pay | Admitting: Internal Medicine

## 2019-09-21 ENCOUNTER — Other Ambulatory Visit: Payer: Self-pay

## 2019-09-22 ENCOUNTER — Telehealth: Payer: Self-pay

## 2019-09-22 NOTE — Telephone Encounter (Signed)
Prior auth completed for Crestor 5 mg, waiting on a response from the [employer].

## 2019-10-08 DIAGNOSIS — Z23 Encounter for immunization: Secondary | ICD-10-CM | POA: Diagnosis not present

## 2019-11-15 DIAGNOSIS — Z23 Encounter for immunization: Secondary | ICD-10-CM | POA: Diagnosis not present

## 2019-11-30 ENCOUNTER — Telehealth: Payer: Medicare Other | Admitting: Internal Medicine

## 2019-12-05 ENCOUNTER — Other Ambulatory Visit: Payer: Self-pay

## 2019-12-05 ENCOUNTER — Other Ambulatory Visit: Payer: Self-pay | Admitting: Internal Medicine

## 2019-12-05 MED ORDER — RYBELSUS 7 MG PO TABS: 1.0000 | ORAL_TABLET | Freq: Every morning | ORAL | 0 refills | Status: DC

## 2019-12-13 ENCOUNTER — Encounter: Payer: Self-pay | Admitting: Internal Medicine

## 2019-12-13 ENCOUNTER — Telehealth (INDEPENDENT_AMBULATORY_CARE_PROVIDER_SITE_OTHER): Payer: Medicare Other | Admitting: Internal Medicine

## 2019-12-13 ENCOUNTER — Other Ambulatory Visit: Payer: Self-pay

## 2019-12-13 VITALS — Ht 64.0 in

## 2019-12-13 DIAGNOSIS — E7800 Pure hypercholesterolemia, unspecified: Secondary | ICD-10-CM | POA: Diagnosis not present

## 2019-12-13 DIAGNOSIS — E1165 Type 2 diabetes mellitus with hyperglycemia: Secondary | ICD-10-CM | POA: Diagnosis not present

## 2019-12-13 DIAGNOSIS — I2510 Atherosclerotic heart disease of native coronary artery without angina pectoris: Secondary | ICD-10-CM | POA: Diagnosis not present

## 2019-12-13 DIAGNOSIS — I2584 Coronary atherosclerosis due to calcified coronary lesion: Secondary | ICD-10-CM | POA: Diagnosis not present

## 2019-12-13 DIAGNOSIS — K5901 Slow transit constipation: Secondary | ICD-10-CM | POA: Diagnosis not present

## 2019-12-13 MED ORDER — RYBELSUS 7 MG PO TABS: 1.0000 | ORAL_TABLET | Freq: Every morning | ORAL | 2 refills | Status: DC

## 2019-12-13 NOTE — Progress Notes (Signed)
Virtual Visit via Video   This visit type was conducted due to national recommendations for restrictions regarding the COVID-19 Pandemic (e.g. social distancing) in an effort to limit this patient's exposure and mitigate transmission in our community.  Due to her co-morbid illnesses, this patient is at least at moderate risk for complications without adequate follow up.  This format is felt to be most appropriate for this patient at this time.  All issues noted in this document were discussed and addressed.  A limited physical exam was performed with this format.    This visit type was conducted due to national recommendations for restrictions regarding the COVID-19 Pandemic (e.g. social distancing) in an effort to limit this patient's exposure and mitigate transmission in our community.  Patients identity confirmed using two different identifiers.  This format is felt to be most appropriate for this patient at this time.  All issues noted in this document were discussed and addressed.  No physical exam was performed (except for noted visual exam findings with Video Visits).    Date:  12/13/2019   ID:  [name] [name], DOB 1950/04/05, MRN [id number]  Patient Location:  Home  Provider location:   Office    Chief Complaint:  "I have my diabetes check"  History of Present Illness:    [name] [name] is a 69 y.o. female who presents via video conferencing for a telehealth visit today.    The patient does not have symptoms concerning for COVID-19 infection (fever, chills, cough, or new shortness of breath).   She presents today for virtual visit. She prefers this method of contact due to COVID-19 pandemic. She presents today for diabetes check. She has been spending a lot of time with her new grandbaby in [place]. She spent the last 3 months and is now ready for a break. She has been doing well with Rybelsus. She reports she started Nutrisystem in May and followed it for about 5  months. She has since lost 34 pounds. She is walking on a regular basis as well.   Diabetes She presents for her follow-up diabetic visit. She has type 2 diabetes mellitus. There are no hypoglycemic associated symptoms. There are no diabetic associated symptoms. There are no diabetic complications. Risk factors for coronary artery disease include diabetes mellitus, dyslipidemia, hypertension and post-menopausal. She participates in exercise daily. An ACE inhibitor/angiotensin II receptor blocker is not being taken.     Past Medical History:  Diagnosis Date  . Arthritis    back & hip- R  . Breast cancer ([place]) 04/15/12   left-biopsy  . Breast cancer, right breast ([place]) 05/14/12   right mastectomy  . Bronchitis   . Cancer ([place])    breast  . 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 CHF with unknown LVEF ([place]) 03/09/2013  . Diabetes mellitus   . Diabetes mellitus without complication ([place])   . GERD (gastroesophageal reflux disease)    pt. experiencing on occasional , h/o og gastroparesis   . Heart murmur   . Hiatal hernia   . Osteopenia   . Pneumonia    treated as an outpt.   . Sinus problem   . Sleep apnea    sleep minimal concern- " along time ago"   Past Surgical History:  Procedure Laterality Date  . AXILLARY SENTINEL NODE BIOPSY Left 05/14/2012   Procedure: AXILLARY SENTINEL NODE BIOPSY;  Surgeon: Adin Hector, MD;  Location: [facility];  Service: General;  Laterality: Left;  . BREAST RECONSTRUCTION  WITH PLACEMENT OF TISSUE EXPANDER AND FLEX HD (ACELLULAR HYDRATED DERMIS) Bilateral 05/14/2012   Procedure: BREAST RECONSTRUCTION WITH PLACEMENT OF TISSUE EXPANDER AND FLEX HD (ACELLULAR HYDRATED DERMIS);  Surgeon: Crissie Reese, MD;  Location: [facility];  Service: Plastics;  Laterality: Bilateral;  . BUNIONECTOMY Bilateral   . CESAREAN SECTION  1981, 1983  . FOOT SURGERY    . GASTRIC BYPASS     1st surgery- at [facility], then an emergent surgery here  [facility]- for perforation of stomach  & then a surgery for debridement   . PORT-A-CATH REMOVAL Right 09/12/2013   Procedure: REMOVAL PORT-A-CATH;  Surgeon: Adin Hector, MD;  Location: [facility];  Service: General;  Laterality: Right;  . PORTACATH PLACEMENT N/A 05/14/2012   Procedure: INSERTION PORT-A-CATH;  Surgeon: Adin Hector, MD;  Location: [facility];  Service: General;  Laterality: N/A;  . TONSILLECTOMY    . TOTAL HIP ARTHROPLASTY Right 03/02/2017  . TOTAL HIP ARTHROPLASTY Right 03/02/2017   Procedure: RIGHT TOTAL HIP ARTHROPLASTY ANTERIOR APPROACH;  Surgeon: Frederik Pear, MD;  Location: [facility];  Service: Orthopedics;  Laterality: Right;  . TOTAL MASTECTOMY Bilateral 05/14/2012   Procedure: TOTAL MASTECTOMY;  Surgeon: Adin Hector, MD;  Location: [facility];  Service: General;  Laterality: Bilateral;  . tummy tuck  1999  . WOUND DEBRIDEMENT  01/14/2011   Procedure: DEBRIDEMENT ABDOMINAL WOUND;  Surgeon: Adin Hector, MD;  Location: [facility];  Service: General;  Laterality: N/A;  wound debridement and debridement on the abdomen     Current Meds  Medication Sig  . Ascorbic Acid (VITAMIN C) 500 MG tablet Take 500 mg by mouth daily.    . Cholecalciferol (VITAMIN D PO) Take 1 tablet by mouth daily.  . CRESTOR 5 MG tablet Take 1 tablet (5 mg total) by mouth daily. DAW1  . Dexlansoprazole (DEXILANT) 30 MG capsule Take 30 mg by mouth daily.  Marland Kitchen ELDERBERRY PO Take by mouth.  Marland Kitchen GARLIC PO Take by mouth.  Marland Kitchen glucose blood (CONTOUR NEXT TEST) test strip Use as instructed TO TEST BLOOD SUGARS ONCE DAILY. DX: E11.9  . KRILL OIL PO Take 1 capsule by mouth daily.   . Magnesium Oxide 250 MG TABS Take 400 mg by mouth daily.   . Menthol, Topical Analgesic, (BLUE-EMU MAXIMUM STRENGTH EX) Apply 1 application topically daily.  . Multiple Vitamin (MULTIVITAMIN PO) Take 1 tablet by mouth daily.   . Probiotic Product (PROBIOTIC PO) Take 1 capsule by mouth daily.  . Semaglutide (RYBELSUS) 7 MG TABS Take 1 tablet  by mouth every morning.  . [DISCONTINUED] Semaglutide (RYBELSUS) 7 MG TABS Take 1 tablet by mouth every morning.     Allergies:   Darvon and Penicillins   Social History   Tobacco Use  . Smoking status: Former Smoker    Quit date: 02/25/2006    Years since quitting: 13.8  . Smokeless tobacco: Never Used  Vaping Use  . Vaping Use: Never used  Substance Use Topics  . Alcohol use: Yes    Alcohol/week: 2.0 - 3.0 standard drinks    Types: 2 - 3 Glasses of wine per week    Comment: no liquor  . Drug use: No     Family Hx: The patient's family history includes Breast cancer (age of onset: 49) in her sister; Breast cancer (age of onset: 5) in her mother; Diabetes in her brother, father, mother, and sister; Heart attack in her brother, brother, and father; Parkinson's disease  in her sister.  ROS:   Please see the history of present illness.    Review of Systems  Constitutional: Negative.   Respiratory: Negative.   Cardiovascular: Negative.   Gastrointestinal: Positive for constipation.  Neurological: Negative.   Psychiatric/Behavioral: Negative.     All other systems reviewed and are negative.   Labs/Other Tests and Data Reviewed:    Recent Labs: 06/28/2019: ALT 15; BUN 6; Creatinine, Ser 0.74; Hemoglobin 14.0; Platelets 295; Potassium 4.3; Sodium 145   Recent Lipid Panel Lab Results  Component Value Date/Time   CHOL 167 06/28/2019 03:38 PM   TRIG 112 06/28/2019 03:38 PM   HDL 67 06/28/2019 03:38 PM   CHOLHDL 2.5 06/28/2019 03:38 PM   LDLCALC 80 06/28/2019 03:38 PM    Wt Readings from Last 3 Encounters:  08/29/19 189 lb 3.2 oz (85.8 kg)  08/16/19 191 lb 9.6 oz (86.9 kg)  06/28/19 201 lb 9.6 oz (91.4 kg)     Exam:    Vital Signs:  Ht _0  (1.626 m)   BMI 32.48 kg/m     Physical Exam Vitals and nursing note reviewed.  Constitutional:      Appearance: Normal appearance.  HENT:     Head: Normocephalic and atraumatic.  Pulmonary:     Effort: Pulmonary effort  is normal.  Musculoskeletal:     Cervical back: Normal range of motion.  Neurological:     Mental Status: She is alert and oriented to person, place, and time.  Psychiatric:        Mood and Affect: Affect normal.     ASSESSMENT & PLAN:    1. Uncontrolled type 2 diabetes mellitus with hyperglycemia ([place]) Comments: Chronic, she agrees to come to office next month for bloodwork. I will put in orders today. She was congratulated on her lifestyle changes.  - BMP8+EGFR; Future - Hemoglobin A1c; Future  2. Pure hypercholesterolemia Comments: Chronic, she will continue with current meds. Encouraged to avoid fried foods and to exercise regularly.   3. Slow transit constipation Comments: Likely exacerbated by Rybelsus. Encouraged to stay well hydrated and advised to consider stool softener 2-3 times per week. She will let me know if her sx persist.     COVID-19 Education: The signs and symptoms of COVID-19 were discussed with the patient and how to seek care for testing (follow up with PCP or arrange E-visit).  The importance of social distancing was discussed today.  Patient Risk:   After full review of this patients clinical status, I feel that they are at least moderate risk at this time.  Time:   Today, I have spent 16 minutes/ seconds with the patient with telehealth technology discussing above diagnoses.  I personally spent 25 minutes face-to-face and non-face-to-face in the care of this patient, which includes all pre-, intra-, and post visit time on the date of service.    Medication Adjustments/Labs and Tests Ordered: Current medicines are reviewed at length with the patient today.  Concerns regarding medicines are outlined above.   Tests Ordered: Orders Placed This Encounter  Procedures  . BMP8+EGFR  . Hemoglobin A1c    Medication Changes: Meds ordered this encounter  Medications  . Semaglutide (RYBELSUS) 7 MG TABS    Sig: Take 1 tablet by mouth every morning.     Dispense:  30 tablet    Refill:  2    Disposition:  Follow up prn  Signed, [name], MD

## 2019-12-13 NOTE — Patient Instructions (Signed)

## 2019-12-20 ENCOUNTER — Telehealth: Payer: Self-pay

## 2019-12-20 NOTE — Telephone Encounter (Signed)
The pt was notified that she doesn't have to fax for her lab appt tomorrow.

## 2019-12-21 ENCOUNTER — Other Ambulatory Visit: Payer: Self-pay

## 2019-12-21 ENCOUNTER — Other Ambulatory Visit: Payer: Medicare Other

## 2019-12-21 DIAGNOSIS — E1165 Type 2 diabetes mellitus with hyperglycemia: Secondary | ICD-10-CM

## 2019-12-21 LAB — BMP8+EGFR
BUN/Creatinine Ratio: 17 (ref 12–28)
BUN: 12 mg/dL (ref 8–27)
CO2: 27 mmol/L (ref 20–29)
Calcium: 10.4 mg/dL — ABNORMAL HIGH (ref 8.7–10.3)
Chloride: 102 mmol/L (ref 96–106)
Creatinine, Ser: 0.71 mg/dL (ref 0.57–1.00)
GFR calc Af Amer: 100 mL/min/{1.73_m2} (ref >59)
GFR calc non Af Amer: 87 mL/min/{1.73_m2} (ref >59)
Glucose: 113 mg/dL — ABNORMAL HIGH (ref 65–99)
Potassium: 4.4 mmol/L (ref 3.5–5.2)
Sodium: 143 mmol/L (ref 134–144)

## 2019-12-21 LAB — HEMOGLOBIN A1C
Est. average glucose Bld gHb Est-mCnc: 134 mg/dL
Hgb A1c MFr Bld: 6.3 % — ABNORMAL HIGH (ref 4.8–5.6)

## 2019-12-27 ENCOUNTER — Telehealth: Payer: Self-pay | Admitting: Oncology

## 2019-12-27 ENCOUNTER — Telehealth: Payer: Self-pay

## 2019-12-27 NOTE — Telephone Encounter (Signed)
Received message from scheduling stating [name] [name] would like to schedule appt, but did not discuss the nature of her appt. This LPN called [name] [name] who states she would like a f/u with Dr Jana Hakim, as it would make her "feel better" to be assessed by him. Dr Jana Hakim made aware. Message sent to scheduling for f/u.

## 2019-12-27 NOTE — Telephone Encounter (Signed)
Scheduled appt per 11/8 sch msg - left message for patient with appt date and time

## 2020-02-26 NOTE — Progress Notes (Signed)
[place]  Telephone:([phone]) [phone] Fax:(336) [phone]     ID: [name] [name] OB: [birth year]  MR#: [id number]  YYQ#:[id number]  Patient Care Team: [name], MD as PCP - General (Internal Medicine) Belva Crome, MD as PCP - Cardiology (Cardiology) Crawford Givens, MD as Consulting Physician (Obstetrics and Gynecology) Fanny Skates, MD as Consulting Physician (General Surgery) Crissie Reese, MD as Consulting Physician (Plastic Surgery) Belva Crome, MD as Consulting Physician (Cardiology) Marylynn Pearson, MD as Consulting Physician (Ophthalmology) Rex Kras, Claudette Stapler, RN as [facility]   CHIEF COMPLAINT: bilateral breast cancers, estrogen receptor positive (s/p mastectomies)  CURRENT TREATMENT: Observation   INTERVAL HISTORY: [name] returns today for follow-up of her estrogen receptor positive breast cancer accompanied by her husband Joe. She was last seen here in 12/2017 when she was released from follow up.  She contacted our office on 12/27/2019 requesting an appointment with me.  She is very concerned about the possibility of breast cancer recurrence.  Recall that her sister died from breast cancer at about the age that Peter Congo is now.  Her mother also had breast cancer.  [name] had definitive reconstruction with silicone implants.  She remembers that they were smooth.  She did not have the exact name or number of the implants available today.   REVIEW OF SYSTEMS: [name] exercises regularly 30 minutes daily on the treadmill and another 30 to 60 minutes on a stationary bike.  She enjoyed the holidays greatly with a new granddaughter currently 91 months old.  A detailed review of systems today was otherwise noncontributory   COVID 19 VACCINATION STATUS: Status post Pfizer x2 with booster August 2021   BREAST CANCER HISTORY: From Dr. Dana Allan original intake note, 04/27/2012:  "[name] G Fluhr is a 70 y.o. female.  With medical history significant for diabetes, heart murmurs, hiatal hernia. Patient's labs normal mammogram was 2 years ago. This year she went on to have a screening mammogram performed that showed 2 areas of concern on the left side.calcifications were noted in the left. In the right breast a possible mass warrants in further evaluation.  On 03/29/2012 patient underwent a bilateral diagnostic mammogram and right breast ultrasound. A spot magnification images demonstrate suspicious group of pleomorphic microcalcifications over the outer lower left breast with additional suspicious group of pleomorphic microcalcifications over the outer midportion of the left periareolar region. Spot compression images of the right breast demonstrate persistence of a 1 cm density at the edge of the film in the deep third of the right inner breast. Ultrasound performed showed no focal abnormality over the entire in her right breast to correspond to the mammographic density. Patient was recommended stereotactic core needle biopsy of the 2 groups of suspicious left breast microcalcifications. Because patient and her husband wanted bilateral mastectomies MRI was not performed.on 04/15/2012 patient had needle core biopsy performed of the 2 areas in the left breast. The left needle core biopsy in the lower breast revealed ductal carcinoma in situ with associated comedo necrosis and calcifications with the in situ carcinoma. It was ER +100% PR +12%. 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 Tumor was ER +100% PR +11% proliferation marker Ki-67 elevated at 80% HER-2/neu showed amplification with a ratio of 6.50. Patient was seen by Dr. Fanny Skates. Patient and her family desire bilateral mastectomies with immediate reconstruction. She is now seen in medical oncology for discussion of adjuvant therapy since patient  does have a HER-2 positive ER positive breast cancer.  Overall she's doing well she is accompanied by her husband and she is without any complaints. Her case was discussed at the multidisciplinary breast conference. Pathology and radiology were reviewed."  Her subsequent history is detailed below    PAST MEDICAL HISTORY: Past Medical History:  Diagnosis Date  . Arthritis    back & hip- R  . Breast cancer ([place]) 04/15/12   left-biopsy  . Breast cancer, right breast ([place]) 05/14/12   right mastectomy  . Bronchitis   . Cancer ([place])    breast  . 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 CHF with unknown LVEF ([place]) 03/09/2013  . Diabetes mellitus   . Diabetes mellitus without complication ([place])   . GERD (gastroesophageal reflux disease)    pt. experiencing on occasional , h/o og gastroparesis   . Heart murmur   . Hiatal hernia   . Osteopenia   . Pneumonia    treated as an outpt.   . Sinus problem   . Sleep apnea    sleep minimal concern- " along time ago"    PAST SURGICAL HISTORY: Past Surgical History:  Procedure Laterality Date  . AXILLARY SENTINEL NODE BIOPSY Left 05/14/2012   Procedure: AXILLARY SENTINEL NODE BIOPSY;  Surgeon: Adin Hector, MD;  Location: [place];  Service: General;  Laterality: Left;  . BREAST RECONSTRUCTION WITH PLACEMENT OF TISSUE EXPANDER AND FLEX HD (ACELLULAR HYDRATED DERMIS) Bilateral 05/14/2012   Procedure: BREAST RECONSTRUCTION WITH PLACEMENT OF TISSUE EXPANDER AND FLEX HD (ACELLULAR HYDRATED DERMIS);  Surgeon: Crissie Reese, MD;  Location: [facility] [facility];  Service: Plastics;  Laterality: Bilateral;  . BUNIONECTOMY Bilateral   . CESAREAN SECTION  1981, 1983  . FOOT SURGERY    . GASTRIC BYPASS     1st surgery- at [facility], then an emergent surgery here  [facility]- for perforation of stomach & then a surgery for debridement   . PORT-A-CATH REMOVAL Right 09/12/2013   Procedure: REMOVAL PORT-A-CATH;  Surgeon: Adin Hector, MD;  Location: [place];  Service: General;  Laterality: Right;  .  PORTACATH PLACEMENT N/A 05/14/2012   Procedure: INSERTION PORT-A-CATH;  Surgeon: Adin Hector, MD;  Location: [place];  Service: General;  Laterality: N/A;  . TONSILLECTOMY    . TOTAL HIP ARTHROPLASTY Right 03/02/2017  . TOTAL HIP ARTHROPLASTY Right 03/02/2017   Procedure: RIGHT TOTAL HIP ARTHROPLASTY ANTERIOR APPROACH;  Surgeon: Frederik Pear, MD;  Location: [place];  Service: Orthopedics;  Laterality: Right;  . TOTAL MASTECTOMY Bilateral 05/14/2012   Procedure: TOTAL MASTECTOMY;  Surgeon: Adin Hector, MD;  Location: [place];  Service: General;  Laterality: Bilateral;  . tummy tuck  1999  . WOUND DEBRIDEMENT  01/14/2011   Procedure: DEBRIDEMENT ABDOMINAL WOUND;  Surgeon: Adin Hector, MD;  Location: [place];  Service: General;  Laterality: N/A;  wound debridement and debridement on the abdomen    FAMILY HISTORY Family History  Problem Relation Age of Onset  . Heart attack Father   . Diabetes Father   . Heart attack Brother   . Diabetes Brother   . Diabetes Sister   . Breast cancer Sister 43  . Diabetes Mother   . Breast cancer Mother 46  . Parkinson's disease Sister   . Heart attack Brother    the patient's father died 2 days after prostate cancer surgery, possibly from a blood clot. The patient's mother lived to be 79 years old. She had  been diagnosed with breast cancer around the age of 5. The patient has 3 brothers, 4 sisters. One sister was diagnosed with breast cancer at the age of 27. There is no history of ovarian cancer in the family. The patient underwent genetic testing October 2014, with a VUS as the only finding   GYNECOLOGIC HISTORY:  Menarche age 31, first live birth age 4, the patient is [place] P2. She went through the change of life approximately age 43. 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  She is now retired.  He is Broadus John "Joe" Fant, who is a former judge and  currently a Architectural technologist. The patient's daughter Garnette Scheuermann is an attorney in [place]. The patient's son Broadus John completed a Masters in Librarian, academic in [place]. The patient has 2 grandchildren.    ADVANCED DIRECTIVES: in place   HEALTH MAINTENANCE: Social History   Tobacco Use  . Smoking status: Former Smoker    Quit date: 02/25/2006    Years since quitting: 14.0  . Smokeless tobacco: Never Used  Vaping Use  . Vaping Use: Never used  Substance Use Topics  . Alcohol use: Yes    Alcohol/week: 2.0 - 3.0 standard drinks    Types: 2 - 3 Glasses of wine per week    Comment: no liquor  . Drug use: No     Colonoscopy:  PAP:  Bone density: January 2015  Lipid panel:  Allergies  Allergen Reactions  . Darvon Hives and Itching  . Penicillins Hives and Itching    Has patient had a PCN reaction causing immediate rash, facial/tongue/throat swelling, SOB or lightheadedness with hypotension: No Has patient had a PCN reaction causing severe rash involving mucus membranes or skin necrosis: No Has patient had a PCN reaction that required hospitalization: No Has patient had a PCN reaction occurring within the last 10 years: No If all of the above answers are "NO", then may proceed with Cephalosporin use.     Current Outpatient Medications  Medication Sig Dispense Refill  . Ascorbic Acid (VITAMIN C) 500 MG tablet Take 500 mg by mouth daily.      . Cholecalciferol (VITAMIN D PO) Take 1 tablet by mouth daily.    . CRESTOR 5 MG tablet Take 1 tablet (5 mg total) by mouth daily. DAW1 90 tablet 2  . Dexlansoprazole (DEXILANT) 30 MG capsule Take 30 mg by mouth daily.    Marland Kitchen ELDERBERRY PO Take by mouth.    Marland Kitchen GARLIC PO Take by mouth.    Marland Kitchen glucose blood (CONTOUR NEXT TEST) test strip Use as instructed TO TEST BLOOD SUGARS ONCE DAILY. DX: E11.9 100 each 0  . KRILL OIL PO Take 1 capsule by mouth daily.     . Magnesium Oxide 250 MG TABS Take 400 mg by mouth daily.     . Menthol,  Topical Analgesic, (BLUE-EMU MAXIMUM STRENGTH EX) Apply 1 application topically daily.    . Multiple Vitamin (MULTIVITAMIN PO) Take 1 tablet by mouth daily.     . Probiotic Product (PROBIOTIC PO) Take 1 capsule by mouth daily.    . Semaglutide (RYBELSUS) 7 MG TABS Take 1 tablet by mouth every morning. 30 tablet 2   No current facility-administered medications for this visit.    OBJECTIVE: African-American woman who looks younger than stated age 68:   02/27/20 1401  BP: (!) 153/60  Pulse: 77  Resp: 18  Temp: (!)  32 F (36.1 C)     Body mass index is 28.17 kg/m.    ECOG FS:1 - Symptomatic but completely ambulatory  Sclerae unicteric, EOMs intact Wearing a mask No cervical or supraclavicular adenopathy Lungs no rales or rhonchi Heart regular rate and rhythm Abd soft, nontender, positive bowel sounds MSK no focal spinal tenderness, no upper extremity lymphedema Neuro: nonfocal, well oriented, appropriate affect Breasts: Status post bilateral mastectomies with bilateral implant reconstruction.  There is no evidence of local recurrence.  Both axillae are benign   LAB RESULTS:  CMP     Component Value Date/Time   NA 143 12/21/2019 1028   NA 141 12/30/2016 1046   K 4.4 12/21/2019 1028   K 4.1 12/30/2016 1046   CL 102 12/21/2019 1028   CL 104 07/30/2012 0955   CO2 27 12/21/2019 1028   CO2 30 (H) 12/30/2016 1046   GLUCOSE 113 (H) 12/21/2019 1028   GLUCOSE 136 (H) 12/29/2017 1054   GLUCOSE 136 12/30/2016 1046   GLUCOSE 138 (H) 07/30/2012 0955   BUN 12 12/21/2019 1028   BUN 12.6 12/30/2016 1046   CREATININE 0.71 12/21/2019 1028   CREATININE 0.8 12/30/2016 1046   CALCIUM 10.4 (H) 12/21/2019 1028   CALCIUM 9.1 12/30/2016 1046   PROT 6.6 06/28/2019 1538   PROT 7.5 12/30/2016 1046   ALBUMIN 4.3 06/28/2019 1538   ALBUMIN 3.9 12/30/2016 1046   AST 15 06/28/2019 1538   AST 20 12/30/2016 1046   ALT 15 06/28/2019 1538   ALT 14 12/30/2016 1046   ALKPHOS 104 06/28/2019 1538    ALKPHOS 102 12/30/2016 1046   BILITOT 0.3 06/28/2019 1538   BILITOT 0.40 12/30/2016 1046   GFRNONAA 87 12/21/2019 1028   GFRAA 100 12/21/2019 1028    No results found for: SPEP  Lab Results  Component Value Date   WBC 5.2 06/28/2019   NEUTROABS 2.6 06/28/2019   HGB 14.0 06/28/2019   HCT 40.9 06/28/2019   MCV 89 06/28/2019   PLT 295 06/28/2019      Chemistry      Component Value Date/Time   NA 143 12/21/2019 1028   NA 141 12/30/2016 1046   K 4.4 12/21/2019 1028   K 4.1 12/30/2016 1046   CL 102 12/21/2019 1028   CL 104 07/30/2012 0955   CO2 27 12/21/2019 1028   CO2 30 (H) 12/30/2016 1046   BUN 12 12/21/2019 1028   BUN 12.6 12/30/2016 1046   CREATININE 0.71 12/21/2019 1028   CREATININE 0.8 12/30/2016 1046      Component Value Date/Time   CALCIUM 10.4 (H) 12/21/2019 1028   CALCIUM 9.1 12/30/2016 1046   ALKPHOS 104 06/28/2019 1538   ALKPHOS 102 12/30/2016 1046   AST 15 06/28/2019 1538   AST 20 12/30/2016 1046   ALT 15 06/28/2019 1538   ALT 14 12/30/2016 1046   BILITOT 0.3 06/28/2019 1538   BILITOT 0.40 12/30/2016 1046       Lab Results  Component Value Date   LABCA2 9 05/10/2012    No components found for: LJQGB201  No results for input(s): INR in the last 168 hours.  Urinalysis    Component Value Date/Time   COLORURINE YELLOW 02/25/2017 1344   APPEARANCEUR CLEAR 02/25/2017 1344   LABSPEC <1.005 (L) 02/25/2017 1344   PHURINE 5.5 02/25/2017 1344   GLUCOSEU NEGATIVE 02/25/2017 1344   HGBUR NEGATIVE 02/25/2017 1344   BILIRUBINUR negative 08/16/2019 1501   KETONESUR NEGATIVE 02/25/2017 1344   PROTEINUR Negative 08/16/2019  [place] 02/25/2017 1344   UROBILINOGEN 0.2 08/16/2019 1501   UROBILINOGEN 0.2 05/10/2012 1444   NITRITE negative 08/16/2019 1501   NITRITE NEGATIVE 02/25/2017 1344   LEUKOCYTESUR Small (1+) (A) 08/16/2019 1501    STUDIES: No results found.    ASSESSMENT: 70 y.o. BRCA negative [place] woman status post  bilateral mastectomies 05/14/2012  (a) on the left, mpT1c pN0, stage IA invasive ductal carcinoma, grade 2, estrogen receptor 100% positive, progesterone receptor 11% positive, with an MIB-1 of 80%, and HER-2 amplified, the ratio being 6.50.  (b) on the right ,pT1c pN0, stage IA invasive ductal carcinoma, grade 3, triple negative, with an MIB-1 of 92%   (1) treated adjuvantly with doxorubicin and cyclophosphamide in dose dense fashion x4, completed 08/13/2012, followed by weekly Abraxane x12 given with concurrent trastuzumab, completed 12/03/2012  (2) received trastuzumab between 08/27/2012 and 06/29/2013, discontinued because of concerns regarding weakening of the heart muscle  (a) echo 07/25/2013 showed an ejection fraction of 55-60%  (3) letrozole started November 2014; completed 5 years November 2019    (4) bone density 03/01/2013 showed osteopenia with a T score of - 1.8  (5) s/p bilateral implant reconstruction with immediate expander placement (51/03/5850) with silicone implant reconstruction, 800 cc round shaped implants.  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 There were no deleterious mutations in the genetic panel tested, which included ATM, BARD1, BRCA1, BRCA2, BRIP1, CDH1, CHEK2, EPCAM, FANCC, MLH1, MSH2, MSH6, NBN, PALB2, PMS2, PTEN, RAD51C, RAD51D, STK11, TP53, and XRCC2   PLAN: [name] is now nearly 8 years out from definitive surgery for her breast cancer with no evidence of disease recurrence.  This is very favorable.  We discussed the fact that ridged/textured silicone implants have been associated with a rare form of lymphoma.  This does not seem to be the type of silicone implant that she has but she did not have the exact information today and she will bring it to her next visit or call us with that information.  In any case with silicone implants there is a standard recommendation for breast MRI either  every 2 years or every 5 years depending on the professional group involved.  It has been nearly 8 years so I think we should proceed to breast MRI at this point and then consider repeating breast MRI every 2 years for the next 4 years, after which we could decrease that to every 5 years.  She is in agreement with the plan and I have written for her to have the MRI sometime this month.  She will return to see [place] in a year from now and that will be a survivorship visit.  MRI then would be repeated in December 2023 or January 2024 which ever is more favorable from an insurance point of view  Total encounter time 25 minutes.*  Total encounter time Yuriko Portales, Virgie Dad, MD  02/27/20 2:19 PM Medical Oncology and Hematology [facility] [place], [place] [address] Tel. [phone]    Fax. [phone]   I, Wilburn Mylar, am acting as scribe for Dr. Virgie Dad. Darshawn Boateng.  I, Lurline Del MD, have reviewed the above documentation for accuracy and completeness, and I agree with the above.  *Total Encounter Time as defined by the Centers for Medicare and Medicaid Services includes, in addition to the face-to-face time of a patient visit (documented in the note above) non-face-to-face time: obtaining and reviewing outside  history, ordering and reviewing medications, tests or procedures, care coordination (communications with other health care professionals or caregivers) and documentation in the medical record.

## 2020-02-27 ENCOUNTER — Other Ambulatory Visit: Payer: Self-pay

## 2020-02-27 ENCOUNTER — Inpatient Hospital Stay: Payer: Medicare Other | Attending: Oncology | Admitting: Oncology

## 2020-02-27 VITALS — BP 153/60 | HR 77 | Temp 97.0°F | Resp 18 | Ht 64.0 in | Wt 164.1 lb

## 2020-02-27 DIAGNOSIS — C50111 Malignant neoplasm of central portion of right female breast: Secondary | ICD-10-CM | POA: Diagnosis not present

## 2020-02-27 DIAGNOSIS — C50812 Malignant neoplasm of overlapping sites of left female breast: Secondary | ICD-10-CM | POA: Diagnosis not present

## 2020-02-27 DIAGNOSIS — Z880 Allergy status to penicillin: Secondary | ICD-10-CM | POA: Diagnosis not present

## 2020-02-27 DIAGNOSIS — Z79899 Other long term (current) drug therapy: Secondary | ICD-10-CM | POA: Diagnosis not present

## 2020-02-27 DIAGNOSIS — E0810 Diabetes mellitus due to underlying condition with ketoacidosis without coma: Secondary | ICD-10-CM | POA: Diagnosis not present

## 2020-02-27 DIAGNOSIS — M8580 Other specified disorders of bone density and structure, unspecified site: Secondary | ICD-10-CM

## 2020-02-27 DIAGNOSIS — Z833 Family history of diabetes mellitus: Secondary | ICD-10-CM | POA: Insufficient documentation

## 2020-02-27 DIAGNOSIS — Z8249 Family history of ischemic heart disease and other diseases of the circulatory system: Secondary | ICD-10-CM | POA: Diagnosis not present

## 2020-02-27 DIAGNOSIS — Z9884 Bariatric surgery status: Secondary | ICD-10-CM | POA: Diagnosis not present

## 2020-02-27 DIAGNOSIS — M1611 Unilateral primary osteoarthritis, right hip: Secondary | ICD-10-CM | POA: Diagnosis not present

## 2020-02-27 DIAGNOSIS — Z170 Estrogen receptor positive status [ER+]: Secondary | ICD-10-CM | POA: Diagnosis not present

## 2020-02-27 DIAGNOSIS — Z8269 Family history of other diseases of the musculoskeletal system and connective tissue: Secondary | ICD-10-CM | POA: Insufficient documentation

## 2020-02-27 DIAGNOSIS — Z803 Family history of malignant neoplasm of breast: Secondary | ICD-10-CM | POA: Insufficient documentation

## 2020-02-27 DIAGNOSIS — K449 Diaphragmatic hernia without obstruction or gangrene: Secondary | ICD-10-CM | POA: Insufficient documentation

## 2020-02-27 DIAGNOSIS — E119 Type 2 diabetes mellitus without complications: Secondary | ICD-10-CM | POA: Diagnosis not present

## 2020-02-27 DIAGNOSIS — Z8042 Family history of malignant neoplasm of prostate: Secondary | ICD-10-CM | POA: Diagnosis not present

## 2020-02-27 DIAGNOSIS — Z171 Estrogen receptor negative status [ER-]: Secondary | ICD-10-CM | POA: Diagnosis not present

## 2020-02-27 DIAGNOSIS — N898 Other specified noninflammatory disorders of vagina: Secondary | ICD-10-CM | POA: Diagnosis not present

## 2020-02-27 DIAGNOSIS — Z87891 Personal history of nicotine dependence: Secondary | ICD-10-CM | POA: Diagnosis not present

## 2020-02-29 ENCOUNTER — Telehealth: Payer: Self-pay | Admitting: Oncology

## 2020-02-29 NOTE — Telephone Encounter (Signed)
Scheduled appts per 1/10 los. Pt confirmed appt date and time.  

## 2020-03-09 ENCOUNTER — Ambulatory Visit
Admission: RE | Admit: 2020-03-09 | Discharge: 2020-03-09 | Disposition: A | Discharge status: Home / Self Care | Payer: Medicare Other | Source: Ambulatory Visit | Attending: Oncology | Admitting: Oncology

## 2020-03-09 ENCOUNTER — Other Ambulatory Visit: Payer: Self-pay

## 2020-03-09 DIAGNOSIS — E0810 Diabetes mellitus due to underlying condition with ketoacidosis without coma: Secondary | ICD-10-CM

## 2020-03-09 DIAGNOSIS — M8580 Other specified disorders of bone density and structure, unspecified site: Secondary | ICD-10-CM

## 2020-03-09 DIAGNOSIS — N898 Other specified noninflammatory disorders of vagina: Secondary | ICD-10-CM

## 2020-03-09 DIAGNOSIS — M1611 Unilateral primary osteoarthritis, right hip: Secondary | ICD-10-CM

## 2020-03-09 DIAGNOSIS — Z170 Estrogen receptor positive status [ER+]: Secondary | ICD-10-CM

## 2020-03-09 DIAGNOSIS — Z171 Estrogen receptor negative status [ER-]: Secondary | ICD-10-CM

## 2020-03-09 DIAGNOSIS — N6489 Other specified disorders of breast: Secondary | ICD-10-CM | POA: Diagnosis not present

## 2020-03-09 MED ORDER — GADOBUTROL 1 MMOL/ML IV SOLN
7.0000 mL | Freq: Once | INTRAVENOUS | Status: AC | PRN
  Administered 2020-03-09: 7 mL via INTRAVENOUS

## 2020-03-12 ENCOUNTER — Other Ambulatory Visit: Payer: Self-pay | Admitting: Oncology

## 2020-03-12 ENCOUNTER — Encounter: Payer: Self-pay | Admitting: Oncology

## 2020-03-12 NOTE — Progress Notes (Signed)
I called [name] with the results of the breast MRI which showed no evidence of cancer.  This is very favorable.  There is a question of a possible intracapsular rupture on the left.  I am not sure this is clinically significant.  She has no pain or other symptoms on that side and I reassured her that even if there was an extracapsular rupture we have found that that does not increase for example the risk of autoimmune disease.  She wanted to make sure her surgeon Dr.Bowers was aware of this and I sent him a letter with a copy of the MRI.

## 2020-03-12 NOTE — Progress Notes (Signed)
[facility]  [phone] [phone]     ID: [name] [name] OB: 1950/11/15  MR#: [id number]  HMC#:[id number]  Patient Care Team: [name], MD as PCP - General (Internal Medicine) Crawford Givens, MD as Consulting Physician (Obstetrics and Gynecology) Fanny Skates, MD as Consulting Physician (General Surgery) Crissie Reese, MD as Consulting Physician (Plastic Surgery) Belva Crome, MD as Consulting Physician (Cardiology) Marylynn Pearson, MD as Consulting Physician (Ophthalmology)   CHIEF COMPLAINT: bilateral breast cancers, estrogen receptor positive (s/p mastectomies)  CURRENT TREATMENT: Observation   INTERVAL HISTORY: [name] returns today for follow-up of her estrogen receptor positive breast cancer accompanied by her husband Joe. She was last seen here in 12/2017 when she was released from follow up.  She contacted our office on 12/27/2019 requesting an appointment with me.  She is very concerned about the possibility of breast cancer recurrence.  Recall that her sister died from breast cancer at about the age that Peter Congo is now.  Her mother also had breast cancer.  [name] had definitive reconstruction with silicone implants.  She remembers that they were smooth.  She did not have the exact name or number of the implants available today.   REVIEW OF SYSTEMS: [name] exercises regularly 30 minutes daily on the treadmill and another 30 to 60 minutes on a stationary bike.  She enjoyed the holidays greatly with a new granddaughter currently 55 months old.  A detailed review of systems today was otherwise noncontributory   COVID 19 VACCINATION STATUS: Status post Pfizer x2 with booster August 2021   BREAST CANCER HISTORY: From Dr. Dana Allan original intake note, 04/27/2012:  "[name] [name] is a 70 y.o. female. With medical history significant for diabetes, heart murmurs, hiatal hernia. Patient's labs normal mammogram was 2  years ago. This year she went on to have a screening mammogram performed that showed 2 areas of concern on the left side.calcifications were noted in the left. In the right breast a possible mass warrants in further evaluation.  On 03/29/2012 patient underwent a bilateral diagnostic mammogram and right breast ultrasound. A spot magnification images demonstrate suspicious group of pleomorphic microcalcifications over the outer lower left breast with additional suspicious group of pleomorphic microcalcifications over the outer midportion of the left periareolar region. Spot compression images of the right breast demonstrate persistence of a 1 cm density at the edge of the film in the deep third of the right inner breast. Ultrasound performed showed no focal abnormality over the entire in her right breast to correspond to the mammographic density. Patient was recommended stereotactic core needle biopsy of the 2 groups of suspicious left breast microcalcifications. Because patient and her husband wanted bilateral mastectomies MRI was not performed.on 04/15/2012 patient had needle core biopsy performed of the 2 areas in the left breast. The left needle core biopsy in the lower breast revealed ductal carcinoma in situ with associated comedo necrosis and calcifications with the in situ carcinoma. It was ER +100% PR +12%. 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 Tumor was ER +100% PR +11% proliferation marker Ki-67 elevated at 80% HER-2/neu showed amplification with a ratio of 6.50. Patient was seen by Dr. Fanny Skates. Patient and her family desire bilateral mastectomies with immediate reconstruction. She is now seen in medical oncology for discussion of adjuvant therapy since patient does have a HER-2 positive ER positive breast cancer. Overall she's doing well she is accompanied by her husband  and she is without any complaints. Her case was discussed  at the multidisciplinary breast conference. Pathology and radiology were reviewed."  Her subsequent history is detailed below    PAST MEDICAL HISTORY: Past Medical History:  Diagnosis Date  . Arthritis    back & hip- R  . Breast cancer ([place]) 04/15/12   left-biopsy  . Breast cancer, right breast ([place]) 05/14/12   right mastectomy  . Bronchitis   . Cancer ([place])    breast  . 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 CHF with unknown LVEF ([place]) 03/09/2013  . Diabetes mellitus   . Diabetes mellitus without complication ([place])   . GERD (gastroesophageal reflux disease)    pt. experiencing on occasional , h/o og gastroparesis   . Heart murmur   . Hiatal hernia   . Osteopenia   . Pneumonia    treated as an outpt.   . Sinus problem   . Sleep apnea    sleep minimal concern- " along time ago"    PAST SURGICAL HISTORY: Past Surgical History:  Procedure Laterality Date  . AXILLARY SENTINEL NODE BIOPSY Left 05/14/2012   Procedure: AXILLARY SENTINEL NODE BIOPSY;  Surgeon: Adin Hector, MD;  Location: [facility];  Service: General;  Laterality: Left;  . BREAST RECONSTRUCTION WITH PLACEMENT OF TISSUE EXPANDER AND FLEX HD (ACELLULAR HYDRATED DERMIS) Bilateral 05/14/2012   Procedure: BREAST RECONSTRUCTION WITH PLACEMENT OF TISSUE EXPANDER AND FLEX HD (ACELLULAR HYDRATED DERMIS);  Surgeon: Crissie Reese, MD;  Location: [facility];  Service: Plastics;  Laterality: Bilateral;  . BUNIONECTOMY Bilateral   . CESAREAN SECTION  1981, 1983  . FOOT SURGERY    . GASTRIC BYPASS     1st surgery- at [facility], then an emergent surgery here  [facility]- for perforation of stomach & then a surgery for debridement   . PORT-A-CATH REMOVAL Right 09/12/2013   Procedure: REMOVAL PORT-A-CATH;  Surgeon: Adin Hector, MD;  Location: [facility];  Service: General;  Laterality: Right;  . PORTACATH PLACEMENT N/A 05/14/2012   Procedure: INSERTION PORT-A-CATH;  Surgeon: Adin Hector, MD;  Location: [facility];   Service: General;  Laterality: N/A;  . TONSILLECTOMY    . TOTAL HIP ARTHROPLASTY Right 03/02/2017  . TOTAL HIP ARTHROPLASTY Right 03/02/2017   Procedure: RIGHT TOTAL HIP ARTHROPLASTY ANTERIOR APPROACH;  Surgeon: Frederik Pear, MD;  Location: [facility];  Service: Orthopedics;  Laterality: Right;  . TOTAL MASTECTOMY Bilateral 05/14/2012   Procedure: TOTAL MASTECTOMY;  Surgeon: Adin Hector, MD;  Location: [facility];  Service: General;  Laterality: Bilateral;  . tummy tuck  1999  . WOUND DEBRIDEMENT  01/14/2011   Procedure: DEBRIDEMENT ABDOMINAL WOUND;  Surgeon: Adin Hector, MD;  Location: [place];  Service: General;  Laterality: N/A;  wound debridement and debridement on the abdomen    FAMILY HISTORY Family History  Problem Relation Age of Onset  . Heart attack Father   . Diabetes Father   . Heart attack Brother   . Diabetes Brother   . Diabetes Sister   . Breast cancer Sister 80  . Diabetes Mother   . Breast cancer Mother 40  . Parkinson's disease Sister   . Heart attack Brother    the patient's father died 2 days after prostate cancer surgery, possibly from a blood clot. The patient's mother lived to be 73 years old. She had been diagnosed with breast cancer around the age of 46. The patient has 3 brothers, 4 sisters. One sister  was diagnosed with breast cancer at the age of 75. There is no history of ovarian cancer in the family. The patient underwent genetic testing October 2014, with a VUS as the only finding   GYNECOLOGIC HISTORY:  Menarche age 82, first live birth age 15, the patient is [place] P2. She went through the change of life approximately age 47. 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  She is now retired.  He is Broadus John "Joe" Clover, who is a former judge and currently a Architectural technologist. The patient's daughter Garnette Scheuermann is an attorney in [place]. The patient's son Broadus John  completed a Masters in Librarian, academic in [place]. The patient has 2 grandchildren.    ADVANCED DIRECTIVES: in place   HEALTH MAINTENANCE: Social History   Tobacco Use  . Smoking status: Former Smoker    Quit date: 02/25/2006    Years since quitting: 14.0  . Smokeless tobacco: Never Used  Vaping Use  . Vaping Use: Never used  Substance Use Topics  . Alcohol use: Yes    Alcohol/week: 2.0 - 3.0 standard drinks    Types: 2 - 3 Glasses of wine per week    Comment: no liquor  . Drug use: No     Colonoscopy:  PAP:  Bone density: January 2015  Lipid panel:  Allergies  Allergen Reactions  . Darvon Hives and Itching  . Penicillins Hives and Itching    Has patient had a PCN reaction causing immediate rash, facial/tongue/throat swelling, SOB or lightheadedness with hypotension: No Has patient had a PCN reaction causing severe rash involving mucus membranes or skin necrosis: No Has patient had a PCN reaction that required hospitalization: No Has patient had a PCN reaction occurring within the last 10 years: No If all of the above answers are "NO", then may proceed with Cephalosporin use.     Current Outpatient Medications  Medication Sig Dispense Refill  . Ascorbic Acid (VITAMIN C) 500 MG tablet Take 500 mg by mouth daily.      . Cholecalciferol (VITAMIN D PO) Take 1 tablet by mouth daily.    . CRESTOR 5 MG tablet Take 1 tablet (5 mg total) by mouth daily. DAW1 90 tablet 2  . Dexlansoprazole (DEXILANT) 30 MG capsule Take 30 mg by mouth daily.    Marland Kitchen ELDERBERRY PO Take by mouth.    Marland Kitchen GARLIC PO Take by mouth.    Marland Kitchen glucose blood (CONTOUR NEXT TEST) test strip Use as instructed TO TEST BLOOD SUGARS ONCE DAILY. DX: E11.9 100 each 0  . KRILL OIL PO Take 1 capsule by mouth daily.     . Magnesium Oxide 250 MG TABS Take 400 mg by mouth daily.     . Menthol, Topical Analgesic, (BLUE-EMU MAXIMUM STRENGTH EX) Apply 1 application topically daily.    . Multiple Vitamin (MULTIVITAMIN  PO) Take 1 tablet by mouth daily.     . Probiotic Product (PROBIOTIC PO) Take 1 capsule by mouth daily.    . Semaglutide (RYBELSUS) 7 MG TABS Take 1 tablet by mouth every morning. 30 tablet 2   No current facility-administered medications for this visit.    OBJECTIVE: African-American woman who looks younger than stated age There were no vitals filed for this visit.   There is no height or weight on file to calculate BMI.    ECOG FS:1 - Symptomatic but completely ambulatory  Sclerae unicteric, EOMs intact Wearing  a mask No cervical or supraclavicular adenopathy Lungs no rales or rhonchi Heart regular rate and rhythm Abd soft, nontender, positive bowel sounds MSK no focal spinal tenderness, no upper extremity lymphedema Neuro: nonfocal, well oriented, appropriate affect Breasts: Status post bilateral mastectomies with bilateral implant reconstruction.  There is no evidence of local recurrence.  Both axillae are benign   LAB RESULTS:  CMP     Component Value Date/Time   NA 143 12/21/2019 1028   NA 141 12/30/2016 1046   K 4.4 12/21/2019 1028   K 4.1 12/30/2016 1046   CL 102 12/21/2019 1028   CL 104 07/30/2012 0955   CO2 27 12/21/2019 1028   CO2 30 (H) 12/30/2016 1046   GLUCOSE 113 (H) 12/21/2019 1028   GLUCOSE 136 (H) 12/29/2017 1054   GLUCOSE 136 12/30/2016 1046   GLUCOSE 138 (H) 07/30/2012 0955   BUN 12 12/21/2019 1028   BUN 12.6 12/30/2016 1046   CREATININE 0.71 12/21/2019 1028   CREATININE 0.8 12/30/2016 1046   CALCIUM 10.4 (H) 12/21/2019 1028   CALCIUM 9.1 12/30/2016 1046   PROT 6.6 06/28/2019 1538   PROT 7.5 12/30/2016 1046   ALBUMIN 4.3 06/28/2019 1538   ALBUMIN 3.9 12/30/2016 1046   AST 15 06/28/2019 1538   AST 20 12/30/2016 1046   ALT 15 06/28/2019 1538   ALT 14 12/30/2016 1046   ALKPHOS 104 06/28/2019 1538   ALKPHOS 102 12/30/2016 1046   BILITOT 0.3 06/28/2019 1538   BILITOT 0.40 12/30/2016 1046   GFRNONAA 87 12/21/2019 1028   GFRAA 100 12/21/2019 1028     No results found for: SPEP  Lab Results  Component Value Date   WBC 5.2 06/28/2019   NEUTROABS 2.6 06/28/2019   HGB 14.0 06/28/2019   HCT 40.9 06/28/2019   MCV 89 06/28/2019   PLT 295 06/28/2019      Chemistry      Component Value Date/Time   NA 143 12/21/2019 1028   NA 141 12/30/2016 1046   K 4.4 12/21/2019 1028   K 4.1 12/30/2016 1046   CL 102 12/21/2019 1028   CL 104 07/30/2012 0955   CO2 27 12/21/2019 1028   CO2 30 (H) 12/30/2016 1046   BUN 12 12/21/2019 1028   BUN 12.6 12/30/2016 1046   CREATININE 0.71 12/21/2019 1028   CREATININE 0.8 12/30/2016 1046      Component Value Date/Time   CALCIUM 10.4 (H) 12/21/2019 1028   CALCIUM 9.1 12/30/2016 1046   ALKPHOS 104 06/28/2019 1538   ALKPHOS 102 12/30/2016 1046   AST 15 06/28/2019 1538   AST 20 12/30/2016 1046   ALT 15 06/28/2019 1538   ALT 14 12/30/2016 1046   BILITOT 0.3 06/28/2019 1538   BILITOT 0.40 12/30/2016 1046       Lab Results  Component Value Date   LABCA2 9 05/10/2012    No components found for: [id number]  No results for input(s): INR in the last 168 hours.  Urinalysis    Component Value Date/Time   COLORURINE YELLOW 02/25/2017 1344   APPEARANCEUR CLEAR 02/25/2017 1344   LABSPEC <1.005 (L) 02/25/2017 1344   PHURINE 5.5 02/25/2017 1344   GLUCOSEU NEGATIVE 02/25/2017 1344   HGBUR NEGATIVE 02/25/2017 1344   BILIRUBINUR negative 08/16/2019 1501   KETONESUR NEGATIVE 02/25/2017 1344   PROTEINUR Negative 08/16/2019 1501   PROTEINUR NEGATIVE 02/25/2017 1344   UROBILINOGEN 0.2 08/16/2019 1501   UROBILINOGEN 0.2 05/10/2012 1444   NITRITE negative 08/16/2019 1501   NITRITE NEGATIVE 02/25/2017  1344   LEUKOCYTESUR Small (1+) (A) 08/16/2019 1501    STUDIES: MR BREAST BILATERAL W WO CONTRAST INC CAD  Result Date: 03/09/2020 CLINICAL DATA:  History of breast cancer status post bilateral mastectomies. Evaluate for recurrence. LABS:  Not performed on site. EXAM: BILATERAL BREAST MRI WITH AND  WITHOUT CONTRAST TECHNIQUE: Multiplanar, multisequence MR images of both breasts were obtained prior to and following the intravenous administration of 7 ml of Gadavist Three-dimensional MR images were rendered by post-processing of the original MR data on an independent workstation. The three-dimensional MR images were interpreted, and findings are reported in the following complete MRI report for this study. Three dimensional images were evaluated at the independent interpreting workstation using the DynaCAD thin client. COMPARISON:  No previous breast MRI. FINDINGS: Breast composition: a. Almost entirely fat. Background parenchymal enhancement: Mild Right breast: No mass or abnormal enhancement. Left breast: No mass or abnormal enhancement. Lymph nodes: No abnormal appearing lymph nodes. Ancillary findings: Questionable intracapsular rupture of the LEFT breast silicone implant. IMPRESSION: 1. Status post bilateral mastectomies with reconstruction. 2. No MRI evidence of malignancy within either breast/chest wall. 3. Questionable intracapsular rupture of the LEFT breast silicone implant. However, the contrast-enhanced protocol performed today to exclude malignancy is different than the noncontrast implant protocol used to evaluate for implant rupture and, therefore, nondiagnostic for this purpose. RECOMMENDATION: 1. Clinical follow-up. 2. If clinically desired, consider noncontrast breast MRI (implant protocol) for more definitive characterization of the possible intracapsular rupture of the LEFT breast silicone implant. These results were called by telephone at the time of interpretation on 03/09/2020 to provider [facility] , who verbally acknowledged these results. BI-RADS CATEGORY  2: Benign. Electronically Signed   By: Bary Richard M.D.   On: 03/09/2020 13:48      ASSESSMENT: 70 y.o. BRCA negative  woman status post bilateral mastectomies 05/14/2012  (a) on the left, mpT1c pN0, stage IA  invasive ductal carcinoma, grade 2, estrogen receptor 100% positive, progesterone receptor 11% positive, with an MIB-1 of 80%, and HER-2 amplified, the ratio being 6.50.  (b) on the right ,pT1c pN0, stage IA invasive ductal carcinoma, grade 3, triple negative, with an MIB-1 of 92%   (1) treated adjuvantly with doxorubicin and cyclophosphamide in dose dense fashion x4, completed 08/13/2012, followed by weekly Abraxane x12 given with concurrent trastuzumab, completed 12/03/2012  (2) received trastuzumab between 08/27/2012 and 06/29/2013, discontinued because of concerns regarding weakening of the heart muscle  (a) echo 07/25/2013 showed an ejection fraction of 55-60%  (3) letrozole started November 2014; completed 5 years November 2019    (4) bone density 03/01/2013 showed osteopenia with a T score of - 1.8  (5) s/p bilateral implant reconstruction with immediate expander placement (05/15/2012) with silicone implant reconstruction, 800 cc round shaped implants.  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 There were no deleterious mutations in the genetic panel tested, which included ATM, BARD1, BRCA1, BRCA2, BRIP1, CDH1, CHEK2, EPCAM, FANCC, MLH1, MSH2, MSH6, NBN, PALB2, PMS2, PTEN, RAD51C, RAD51D, STK11, TP53, and XRCC2   PLAN: [name] [name] is now nearly 8 years out from definitive surgery for her breast cancer with no evidence of disease recurrence.  This is very favorable.  We discussed the fact that ridged/textured silicone implants have been associated with a rare form of lymphoma.  This does not seem to be the type of silicone implant that she has but she did not have the exact information today and she will  bring it to her next visit or call us with that information.  In any case with silicone implants there is a standard recommendation for breast MRI either every 2 years or every 5 years depending on the professional group  involved.  It has been nearly 8 years so I think we should proceed to breast MRI at this point and then consider repeating breast MRI every 2 years for the next 4 years, after which we could decrease that to every 5 years.  She is in agreement with the plan and I have written for her to have the MRI sometime this month.  She will return to see [place] in a year from now and that will be a survivorship visit.  MRI then would be repeated in December 2023 or January 2024 which ever is more favorable from an insurance point of view  Total encounter time 25 minutes.*  Total encounter time [name] [name], Virgie Dad, MD  03/12/20 3:22 PM Medical Oncology and Hematology [facility] [place], [place] [address] Tel. [phone]    Fax. [phone]   I, Wilburn Mylar, am acting as scribe for Dr. Virgie Dad. [name] [name].  I, Lurline Del MD, have reviewed the above documentation for accuracy and completeness, and I agree with the above.  *Total Encounter Time as defined by the Centers for Medicare and Medicaid Services includes, in addition to the face-to-face time of a patient visit (documented in the note above) non-face-to-face time: obtaining and reviewing outside history, ordering and reviewing medications, tests or procedures, care coordination (communications with other health care professionals or caregivers) and documentation in the medical record.

## 2020-03-14 ENCOUNTER — Other Ambulatory Visit: Payer: Self-pay

## 2020-03-14 ENCOUNTER — Encounter: Payer: Self-pay | Admitting: Internal Medicine

## 2020-03-14 ENCOUNTER — Ambulatory Visit (INDEPENDENT_AMBULATORY_CARE_PROVIDER_SITE_OTHER): Payer: Medicare Other

## 2020-03-14 ENCOUNTER — Ambulatory Visit (INDEPENDENT_AMBULATORY_CARE_PROVIDER_SITE_OTHER): Payer: Medicare Other | Admitting: Internal Medicine

## 2020-03-14 VITALS — BP 114/82 | HR 66 | Temp 98.3°F | Ht 64.0 in | Wt 163.6 lb

## 2020-03-14 DIAGNOSIS — E663 Overweight: Secondary | ICD-10-CM | POA: Diagnosis not present

## 2020-03-14 DIAGNOSIS — Z1159 Encounter for screening for other viral diseases: Secondary | ICD-10-CM | POA: Diagnosis not present

## 2020-03-14 DIAGNOSIS — T8543XD Leakage of breast prosthesis and implant, subsequent encounter: Secondary | ICD-10-CM

## 2020-03-14 DIAGNOSIS — E7800 Pure hypercholesterolemia, unspecified: Secondary | ICD-10-CM

## 2020-03-14 DIAGNOSIS — E1165 Type 2 diabetes mellitus with hyperglycemia: Secondary | ICD-10-CM

## 2020-03-14 DIAGNOSIS — Z6828 Body mass index (BMI) 28.0-28.9, adult: Secondary | ICD-10-CM | POA: Diagnosis not present

## 2020-03-14 DIAGNOSIS — Z0000 Encounter for general adult medical examination without abnormal findings: Secondary | ICD-10-CM

## 2020-03-14 DIAGNOSIS — H5789 Other specified disorders of eye and adnexa: Secondary | ICD-10-CM

## 2020-03-14 NOTE — Progress Notes (Signed)
I,Katawbba Wiggins,acting as a Education administrator for [name], MD.,have documented all relevant documentation on the behalf of [name], MD,as directed by  [name], MD while in the presence of [name], MD.  This visit occurred during the SARS-CoV-2 public health emergency.  Safety protocols were in place, including screening questions prior to the visit, additional usage of staff PPE, and extensive cleaning of exam room while observing appropriate contact time as indicated for disinfecting solutions.  Subjective:     Patient ID: [name] [name] , female    DOB: [birth year] , 70 y.o.   MRN: [id number]   Chief Complaint  Patient presents with  . Diabetes  . Hyperlipidemia    HPI   She presents today for diabetes check and cholesterol f/u. She reports compliance with meds. She has been doing well with Rybelsus. She has not had any issues with the medication. She reports she has been exercising regularly.   Diabetes She presents for her follow-up diabetic visit. She has type 2 diabetes mellitus. There are no hypoglycemic associated symptoms. There are no diabetic associated symptoms. There are no diabetic complications. Risk factors for coronary artery disease include diabetes mellitus, dyslipidemia, hypertension and post-menopausal. She participates in exercise daily. An ACE inhibitor/angiotensin II receptor blocker is not being taken.  Hyperlipidemia This is a chronic problem. The current episode started more than 1 year ago. The problem is controlled. Exacerbating diseases include diabetes. Pertinent negatives include no myalgias or shortness of breath. Current antihyperlipidemic treatment includes statins. The current treatment provides moderate improvement of lipids. There are no compliance problems.  Risk factors for coronary artery disease include post-menopausal, diabetes mellitus and dyslipidemia.     Past Medical History:  Diagnosis Date  . Arthritis    back &  hip- R  . Breast cancer ([place]) 04/15/12   left-biopsy  . Breast cancer, right breast ([place]) 05/14/12   right mastectomy  . Bronchitis   . Cancer ([place])    breast  . 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 CHF with unknown LVEF ([place]) 03/09/2013  . Diabetes mellitus   . Diabetes mellitus without complication ([place])   . GERD (gastroesophageal reflux disease)    pt. experiencing on occasional , h/o og gastroparesis   . Heart murmur   . Hiatal hernia   . Osteopenia   . Pneumonia    treated as an outpt.   . Sinus problem   . Sleep apnea    sleep minimal concern- " along time ago"     Family History  Problem Relation Age of Onset  . Heart attack Father   . Diabetes Father   . Heart attack Brother   . Diabetes Brother   . Diabetes Sister   . Breast cancer Sister 57  . Diabetes Mother   . Breast cancer Mother 3  . Parkinson's disease Sister   . Heart attack Brother      Current Outpatient Medications:  .  Ascorbic Acid (VITAMIN C) 500 MG tablet, Take 500 mg by mouth daily., Disp: , Rfl:  .  Cholecalciferol (VITAMIN D PO), Take 1 tablet by mouth daily., Disp: , Rfl:  .  CRESTOR 5 MG tablet, Take 1 tablet (5 mg total) by mouth daily. DAW1, Disp: 90 tablet, Rfl: 2 .  Dexlansoprazole 30 MG capsule, Take 30 mg by mouth daily., Disp: , Rfl:  .  ELDERBERRY PO, Take by mouth., Disp: , Rfl:  .  GARLIC PO, Take by mouth., Disp: ,  Rfl:  .  KRILL OIL PO, Take 1 capsule by mouth daily. , Disp: , Rfl:  .  Magnesium Oxide 250 MG TABS, Take 400 mg by mouth daily. , Disp: , Rfl:  .  Menthol, Topical Analgesic, (BLUE-EMU MAXIMUM STRENGTH EX), Apply 1 application topically daily., Disp: , Rfl:  .  Multiple Vitamin (MULTIVITAMIN PO), Take 1 tablet by mouth daily., Disp: , Rfl:  .  Probiotic Product (PROBIOTIC PO), Take 1 capsule by mouth daily., Disp: , Rfl:  .  glucose blood (CONTOUR NEXT TEST) test strip, Use as instructed TO TEST BLOOD SUGARS ONCE DAILY. DX: E11.9, Disp: 100 each, Rfl: 0 .   Semaglutide (RYBELSUS) 7 MG TABS, Take 1 tablet by mouth every morning., Disp: 30 tablet, Rfl: 2   Allergies  Allergen Reactions  . Darvon Hives and Itching  . Penicillins Hives and Itching    Has patient had a PCN reaction causing immediate rash, facial/tongue/throat swelling, SOB or lightheadedness with hypotension: No Has patient had a PCN reaction causing severe rash involving mucus membranes or skin necrosis: No Has patient had a PCN reaction that required hospitalization: No Has patient had a PCN reaction occurring within the last 10 years: No If all of the above answers are "NO", then may proceed with Cephalosporin use.      Review of Systems  Constitutional: Negative.   Respiratory: Negative.  Negative for shortness of breath.   Cardiovascular: Negative.   Gastrointestinal: Negative.   Musculoskeletal: Negative for myalgias.  Psychiatric/Behavioral: Negative.   All other systems reviewed and are negative.    Today's Vitals   03/14/20 1138  BP: 114/82  Pulse: 66  Temp: 98.3 F (36.8 C)  TempSrc: Oral  Weight: 163 lb 9.6 oz (74.2 kg)  Height: 5\' 4"  (1.626 m)  PainSc: 0-No pain   Body mass index is 28.08 kg/m.  Wt Readings from Last 3 Encounters:  03/14/20 163 lb 9.6 oz (74.2 kg)  03/14/20 163 lb 9.6 oz (74.2 kg)  02/27/20 164 lb 1.6 oz (74.4 kg)   Objective:  Physical Exam Vitals and nursing note reviewed.  Constitutional:      Appearance: Normal appearance.  HENT:     Head: Normocephalic and atraumatic.     Nose:     Comments: Masked     Mouth/Throat:     Comments: Masked  Cardiovascular:     Rate and Rhythm: Normal rate and regular rhythm.     Heart sounds: Normal heart sounds.  Pulmonary:     Effort: Pulmonary effort is normal.     Breath sounds: Normal breath sounds.  Musculoskeletal:     Cervical back: Normal range of motion.  Skin:    General: Skin is warm.  Neurological:     General: No focal deficit present.     Mental Status: She is  alert and oriented to person, place, and time.         Assessment And Plan:     1. Uncontrolled type 2 diabetes mellitus with hyperglycemia ([place]) Comments: Chronic, she is doing well with Rybelsus. I will send rx 7mg  dose to her local pharmacy.   2. Pure hypercholesterolemia Comments: Chronic, she will c/w Crestor. She was unable to tolerate generic rosuvastatin.   3. Silicone leakage from breast implant, subsequent encounter Comments: Dr. Virgie Dad note reviewed. She has upcoming MRI breasts for further evaluation. She will f/u Dr. Harlow Mares as needed.   4. Overweight with body mass index (BMI) of 28 to 28.9 in adult  Comments: Her BMI is acceptable for her demographic. She was congratulated on her lifestyle changes.   5. Encounter for HCV screening test for low risk patient Comments: I will check HCV antibody at her next visit.   Patient was given opportunity to ask questions. Patient verbalized understanding of the plan and was able to repeat key elements of the plan. All questions were answered to their satisfaction.   I, [name], MD, have reviewed all documentation for this visit. The documentation on 04/07/20 for the exam, diagnosis, procedures, and orders are all accurate and complete.  THE PATIENT IS ENCOURAGED TO PRACTICE SOCIAL DISTANCING DUE TO THE COVID-19 PANDEMIC.

## 2020-03-14 NOTE — Patient Instructions (Signed)
[name] [name] , Thank you for taking time to come for your Medicare Wellness Visit. I appreciate your ongoing commitment to your health goals. Please review the following plan we discussed and let me know if I can assist you in the future.   Screening recommendations/referrals: Colonoscopy: completed 04/28/2012, due 04/29/2022 Mammogram: not required Bone Density: completed 03/01/2013 Recommended yearly ophthalmology/optometry visit for glaucoma screening and checkup Recommended yearly dental visit for hygiene and checkup  Vaccinations: Influenza vaccine: completed 11/18/2019, due 09/17/2020 Pneumococcal vaccine: completed 10/06/2018 Tdap vaccine: completed 01/25/2011, due 01/24/2021 Shingles vaccine: completed   Covid-19: 10/08/2019, 03/31/2019, 03/17/2019  Advanced directives: Please bring a copy of your POA (Power of Attorney) and/or Living Will to your next appointment.   Conditions/risks identified: none  Next appointment: Follow up in one year for your annual wellness visit    Preventive Care 65 Years and Older, Female Preventive care refers to lifestyle choices and visits with your health care provider that can promote health and wellness. What does preventive care include?  A yearly physical exam. This is also called an annual well check.  Dental exams once or twice a year.  Routine eye exams. Ask your health care provider how often you should have your eyes checked.  Personal lifestyle choices, including:  Daily care of your teeth and gums.  Regular physical activity.  Eating a healthy diet.  Avoiding tobacco and drug use.  Limiting alcohol use.  Practicing safe sex.  Taking low-dose aspirin every day.  Taking vitamin and mineral supplements as recommended by your health care provider. What happens during an annual well check? The services and screenings done by your health care provider during your annual well check will depend on your age, overall health, lifestyle  risk factors, and family history of disease. Counseling  Your health care provider may ask you questions about your:  Alcohol use.  Tobacco use.  Drug use.  Emotional well-being.  Home and relationship well-being.  Sexual activity.  Eating habits.  History of falls.  Memory and ability to understand (cognition).  Work and work Statistician.  Reproductive health. Screening  You may have the following tests or measurements:  Height, weight, and BMI.  Blood pressure.  Lipid and cholesterol levels. These may be checked every 5 years, or more frequently if you are over 51 years old.  Skin check.  Lung cancer screening. You may have this screening every year starting at age 20 if you have a 30-pack-year history of smoking and currently smoke or have quit within the past 15 years.  Fecal occult blood test (FOBT) of the stool. You may have this test every year starting at age 23.  Flexible sigmoidoscopy or colonoscopy. You may have a sigmoidoscopy every 5 years or a colonoscopy every 10 years starting at age 61.  Hepatitis C blood test.  Hepatitis B blood test.  Sexually transmitted disease (STD) testing.  Diabetes screening. This is done by checking your blood sugar (glucose) after you have not eaten for a while (fasting). You may have this done every 1-3 years.  Bone density scan. This is done to screen for osteoporosis. You may have this done starting at age 8.  Mammogram. This may be done every 1-2 years. Talk to your health care provider about how often you should have regular mammograms. Talk with your health care provider about your test results, treatment options, and if necessary, the need for more tests. Vaccines  Your health care provider may recommend certain vaccines, such as:  Influenza vaccine. This is recommended every year.  Tetanus, diphtheria, and acellular pertussis (Tdap, Td) vaccine. You may need a Td booster every 10 years.  Zoster vaccine.  You may need this after age 33.  Pneumococcal 13-valent conjugate (PCV13) vaccine. One dose is recommended after age 15.  Pneumococcal polysaccharide (PPSV23) vaccine. One dose is recommended after age 29. Talk to your health care provider about which screenings and vaccines you need and how often you need them. This information is not intended to replace advice given to you by your health care provider. Make sure you discuss any questions you have with your health care provider. Document Released: 03/02/2015 Document Revised: 10/24/2015 Document Reviewed: 12/05/2014 Elsevier Interactive Patient Education  2017 [employer] Prevention in the Home Falls can cause injuries. They can happen to people of all ages. There are many things you can do to make your home safe and to help prevent falls. What can I do on the outside of my home?  Regularly fix the edges of walkways and driveways and fix any cracks.  Remove anything that might make you trip as you walk through a door, such as a raised step or threshold.  Trim any bushes or trees on the path to your home.  Use bright outdoor lighting.  Clear any walking paths of anything that might make someone trip, such as rocks or tools.  Regularly check to see if handrails are loose or broken. Make sure that both sides of any steps have handrails.  Any raised decks and porches should have guardrails on the edges.  Have any leaves, snow, or ice cleared regularly.  Use sand or salt on walking paths during winter.  Clean up any spills in your garage right away. This includes oil or grease spills. What can I do in the bathroom?  Use night lights.  Install grab bars by the toilet and in the tub and shower. Do not use towel bars as grab bars.  Use non-skid mats or decals in the tub or shower.  If you need to sit down in the shower, use a plastic, non-slip stool.  Keep the floor dry. Clean up any water that spills on the floor as soon  as it happens.  Remove soap buildup in the tub or shower regularly.  Attach bath mats securely with double-sided non-slip rug tape.  Do not have throw rugs and other things on the floor that can make you trip. What can I do in the bedroom?  Use night lights.  Make sure that you have a light by your bed that is easy to reach.  Do not use any sheets or blankets that are too big for your bed. They should not hang down onto the floor.  Have a firm chair that has side arms. You can use this for support while you get dressed.  Do not have throw rugs and other things on the floor that can make you trip. What can I do in the kitchen?  Clean up any spills right away.  Avoid walking on wet floors.  Keep items that you use a lot in easy-to-reach places.  If you need to reach something above you, use a strong step stool that has a grab bar.  Keep electrical cords out of the way.  Do not use floor polish or wax that makes floors slippery. If you must use wax, use non-skid floor wax.  Do not have throw rugs and other things on the floor that  can make you trip. What can I do with my stairs?  Do not leave any items on the stairs.  Make sure that there are handrails on both sides of the stairs and use them. Fix handrails that are broken or loose. Make sure that handrails are as long as the stairways.  Check any carpeting to make sure that it is firmly attached to the stairs. Fix any carpet that is loose or worn.  Avoid having throw rugs at the top or bottom of the stairs. If you do have throw rugs, attach them to the floor with carpet tape.  Make sure that you have a light switch at the top of the stairs and the bottom of the stairs. If you do not have them, ask someone to add them for you. What else can I do to help prevent falls?  Wear shoes that:  Do not have high heels.  Have rubber bottoms.  Are comfortable and fit you well.  Are closed at the toe. Do not wear sandals.  If  you use a stepladder:  Make sure that it is fully opened. Do not climb a closed stepladder.  Make sure that both sides of the stepladder are locked into place.  Ask someone to hold it for you, if possible.  Clearly mark and make sure that you can see:  Any grab bars or handrails.  First and last steps.  Where the edge of each step is.  Use tools that help you move around (mobility aids) if they are needed. These include:  Canes.  Walkers.  Scooters.  Crutches.  Turn on the lights when you go into a dark area. Replace any light bulbs as soon as they burn out.  Set up your furniture so you have a clear path. Avoid moving your furniture around.  If any of your floors are uneven, fix them.  If there are any pets around you, be aware of where they are.  Review your medicines with your doctor. Some medicines can make you feel dizzy. This can increase your chance of falling. Ask your doctor what other things that you can do to help prevent falls. This information is not intended to replace advice given to you by your health care provider. Make sure you discuss any questions you have with your health care provider. Document Released: 11/30/2008 Document Revised: 07/12/2015 Document Reviewed: 03/10/2014 Elsevier Interactive Patient Education  2017 [employer].

## 2020-03-14 NOTE — Progress Notes (Signed)
This visit occurred during the SARS-CoV-2 public health emergency.  Safety protocols were in place, including screening questions prior to the visit, additional usage of staff PPE, and extensive cleaning of exam room while observing appropriate contact time as indicated for disinfecting solutions.  Subjective:   [name] [name] is a 70 y.o. female who presents for Medicare Annual (Subsequent) preventive examination.  Review of Systems     Cardiac Risk Factors include: advanced age (>26men, >49 women);diabetes mellitus     Objective:    Today's Vitals   03/14/20 1148  BP: 114/82  Pulse: 66  Temp: 98.3 F (36.8 C)  TempSrc: Oral  Weight: 163 lb 9.6 oz (74.2 kg)  Height: 5\' 4"  (1.626 m)   Body mass index is 28.08 kg/m.  Advanced Directives 03/14/2020 03/09/2019 03/03/2018 03/02/2017 02/25/2017 05/15/2016 01/01/2016  Does Patient Have a Medical Advance Directive? Yes Yes Yes Yes Yes Yes Yes  Type of Paramedic of [place];Living will [place];Living will [place];Living will Living will;Healthcare Power of Attorney - Living will;Healthcare Power of Attorney -  Does patient want to make changes to medical advance directive? - - No - Patient declined No - Patient declined No - Patient declined - -  Copy of [place] in Chart? No - copy requested No - copy requested No - copy requested No - copy requested - - -  Pre-existing out of facility DNR order (yellow form or pink MOST form) - - - - - - -    Current Medications (verified) Outpatient Encounter Medications as of 03/14/2020  Medication Sig  . Ascorbic Acid (VITAMIN C) 500 MG tablet Take 500 mg by mouth daily.  . Cholecalciferol (VITAMIN D PO) Take 1 tablet by mouth daily.  . CRESTOR 5 MG tablet Take 1 tablet (5 mg total) by mouth daily. DAW1  . Dexlansoprazole 30 MG capsule Take 30 mg by mouth daily.  Marland Kitchen ELDERBERRY PO Take by mouth.  Marland Kitchen GARLIC PO Take  by mouth.  Marland Kitchen glucose blood (CONTOUR NEXT TEST) test strip Use as instructed TO TEST BLOOD SUGARS ONCE DAILY. DX: E11.9  . KRILL OIL PO Take 1 capsule by mouth daily.   . Magnesium Oxide 250 MG TABS Take 400 mg by mouth daily.   . Menthol, Topical Analgesic, (BLUE-EMU MAXIMUM STRENGTH EX) Apply 1 application topically daily.  . Multiple Vitamin (MULTIVITAMIN PO) Take 1 tablet by mouth daily.  . Probiotic Product (PROBIOTIC PO) Take 1 capsule by mouth daily.  . Semaglutide (RYBELSUS) 7 MG TABS Take 1 tablet by mouth every morning.   No facility-administered encounter medications on file as of 03/14/2020.    Allergies (verified) Darvon and Penicillins   History: Past Medical History:  Diagnosis Date  . Arthritis    back & hip- R  . Breast cancer ([place]) 04/15/12   left-biopsy  . Breast cancer, right breast ([place]) 05/14/12   right mastectomy  . Bronchitis   . Cancer ([place])    breast  . 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 CHF with unknown LVEF ([place]) 03/09/2013  . Diabetes mellitus   . Diabetes mellitus without complication ([place])   . GERD (gastroesophageal reflux disease)    pt. experiencing on occasional , h/o og gastroparesis   . Heart murmur   . Hiatal hernia   . Osteopenia   . Pneumonia    treated as an outpt.   . Sinus problem   . Sleep apnea  sleep minimal concern- " along time ago"   Past Surgical History:  Procedure Laterality Date  . AXILLARY SENTINEL NODE BIOPSY Left 05/14/2012   Procedure: AXILLARY SENTINEL NODE BIOPSY;  Surgeon: Adin Hector, MD;  Location: [place];  Service: General;  Laterality: Left;  . BREAST RECONSTRUCTION WITH PLACEMENT OF TISSUE EXPANDER AND FLEX HD (ACELLULAR HYDRATED DERMIS) Bilateral 05/14/2012   Procedure: BREAST RECONSTRUCTION WITH PLACEMENT OF TISSUE EXPANDER AND FLEX HD (ACELLULAR HYDRATED DERMIS);  Surgeon: Crissie Reese, MD;  Location: [place];  Service: Plastics;  Laterality: Bilateral;  . BUNIONECTOMY Bilateral   . CESAREAN  SECTION  1981, 1983  . FOOT SURGERY    . GASTRIC BYPASS     1st surgery- at [facility], then an emergent surgery here  [facility]- for perforation of stomach & then a surgery for debridement   . PORT-A-CATH REMOVAL Right 09/12/2013   Procedure: REMOVAL PORT-A-CATH;  Surgeon: Adin Hector, MD;  Location: [place];  Service: General;  Laterality: Right;  . PORTACATH PLACEMENT N/A 05/14/2012   Procedure: INSERTION PORT-A-CATH;  Surgeon: Adin Hector, MD;  Location: [place];  Service: General;  Laterality: N/A;  . SHOULDER SURGERY Left 06/2019  . TONSILLECTOMY    . TOTAL HIP ARTHROPLASTY Right 03/02/2017  . TOTAL HIP ARTHROPLASTY Right 03/02/2017   Procedure: RIGHT TOTAL HIP ARTHROPLASTY ANTERIOR APPROACH;  Surgeon: Frederik Pear, MD;  Location: [place];  Service: Orthopedics;  Laterality: Right;  . TOTAL MASTECTOMY Bilateral 05/14/2012   Procedure: TOTAL MASTECTOMY;  Surgeon: Adin Hector, MD;  Location: [place];  Service: General;  Laterality: Bilateral;  . tummy tuck  1999  . WOUND DEBRIDEMENT  01/14/2011   Procedure: DEBRIDEMENT ABDOMINAL WOUND;  Surgeon: Adin Hector, MD;  Location: [place];  Service: General;  Laterality: N/A;  wound debridement and debridement on the abdomen   Family History  Problem Relation Age of Onset  . Heart attack Father   . Diabetes Father   . Heart attack Brother   . Diabetes Brother   . Diabetes Sister   . Breast cancer Sister 73  . Diabetes Mother   . Breast cancer Mother 93  . Parkinson's disease Sister   . Heart attack Brother    Social History   Socioeconomic History  . Marital status: Married    Spouse name: Joe  . Number of children: 2  . Years of education: 10  . Highest education level: Not on file  Occupational History  . 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 [place], [place]  . Occupation: retired  Tobacco Use  . Smoking status: Former Smoker    Quit date: 02/25/2006    Years since quitting:  14.0  . Smokeless tobacco: Never Used  Vaping Use  . Vaping Use: Never used  Substance and Sexual Activity  . Alcohol use: Yes    Alcohol/week: 2.0 - 3.0 standard drinks    Types: 2 - 3 Glasses of wine per week    Comment: no liquor  . Drug use: No  . Sexual activity: Yes    Partners: Male    Birth control/protection: Post-menopausal  Other Topics Concern  . Not on file  Social History Narrative  . Not on file   Social Determinants of Health   Financial Resource Strain: Low Risk   . Difficulty of Paying Living Expenses: Not hard at all  Food Insecurity: No Food Insecurity  . Worried About Charity fundraiser in  the Last Year: Never true  . Ran Out of Food in the Last Year: Never true  Transportation Needs: No Transportation Needs  . Lack of Transportation (Medical): No  . Lack of Transportation (Non-Medical): No  Physical Activity: Sufficiently Active  . Days of Exercise per Week: 7 days  . Minutes of Exercise per Session: 60 min  Stress: No Stress Concern Present  . Feeling of Stress : Not at all  Social Connections: Not on file    Tobacco Counseling Counseling given: Not Answered   Clinical Intake:  Pre-visit preparation completed: Yes  Pain : No/denies pain     Nutritional Status: BMI 25 -29 Overweight Nutritional Risks: None Diabetes: Yes  How often do you need to have someone help you when you read instructions, pamphlets, or other written materials from your doctor or pharmacy?: 1 - Never What is the last grade level you completed in school?: master's degree  Diabetic? Yes Nutrition Risk Assessment:  Has the patient had any N/V/D within the last 2 months?  No  Does the patient have any non-healing wounds?  No  Has the patient had any unintentional weight loss or weight gain?  No   Diabetes:  Is the patient diabetic?  Yes  If diabetic, was a CBG obtained today?  No  Did the patient bring in their glucometer from home?  No  How often do you  monitor your CBG's? 2-3 weekly.   Financial Strains and Diabetes Management:  Are you having any financial strains with the device, your supplies or your medication? No .  Does the patient want to be seen by Chronic Care Management for management of their diabetes?  No  Would the patient like to be referred to a Nutritionist or for Diabetic Management?  No   Diabetic Exams:  Diabetic Eye Exam: Completed 08/14/2019 Diabetic Foot Exam: Overdue, Pt has been advised about the importance in completing this exam. Pt is scheduled for diabetic foot exam on next appointment.   Interpreter Needed?: No  Information entered by :: NAllen LPN   Activities of Daily Living In your present state of health, do you have any difficulty performing the following activities: 03/14/2020 03/14/2020  Hearing? Y Y  Comment sometimes -  Vision? N N  Difficulty concentrating or making decisions? N Y  Walking or climbing stairs? N N  Dressing or bathing? N N  Doing errands, shopping? N N  Preparing Food and eating ? N -  Using the Toilet? N -  In the past six months, have you accidently leaked urine? N -  Do you have problems with loss of bowel control? N -  Managing your Medications? N -  Managing your Finances? N -  Housekeeping or managing your Housekeeping? N -  Some recent data might be hidden    Patient Care Team: [name], MD as PCP - General (Internal Medicine) Crawford Givens, MD as Consulting Physician (Obstetrics and Gynecology) Fanny Skates, MD as Consulting Physician (General Surgery) Crissie Reese, MD as Consulting Physician (Plastic Surgery) Belva Crome, MD as Consulting Physician (Cardiology) Marylynn Pearson, MD as Consulting Physician (Ophthalmology)  Indicate any recent Medical Services you may have received from other than [facility] providers in the past year (date may be approximate).     Assessment:   This is a routine wellness examination for [name].  Hearing/Vision  screen No exam data present  Dietary issues and exercise activities discussed: Current Exercise Habits: Home exercise routine, Type of exercise: treadmill;Other -  see comments;walking (stationary bike, elliptical), Time (Minutes): 60, Frequency (Times/Week): 7, Weekly Exercise (Minutes/Week): 420  Goals    . Exercise 3x per week (30 min per time)     Wants to exercise 5-6 days per week    . Patient Stated     03/09/2019, wants to stay healthy    . Patient Stated     03/14/2020, eat healthy and exercise, wants to get off diabetic medication      Depression Screen PHQ 2/9 Scores 03/14/2020 03/14/2020 03/09/2019 03/03/2018  PHQ - 2 Score 0 0 0 0  PHQ- 9 Score - - 0 0    Fall Risk Fall Risk  03/14/2020 03/14/2020 03/09/2019 03/03/2018  Falls in the past year? 0 0 0 0  Risk for fall due to : Medication side effect - Medication side effect Medication side effect  Follow up Falls evaluation completed;Education provided;Falls prevention discussed - Falls evaluation completed;Education provided;Falls prevention discussed Education provided;Falls prevention discussed    FALL RISK PREVENTION PERTAINING TO THE HOME:  Any stairs in or around the home? Yes  If so, are there any without handrails? No  Home free of loose throw rugs in walkways, pet beds, electrical cords, etc? Yes  Adequate lighting in your home to reduce risk of falls? Yes   ASSISTIVE DEVICES UTILIZED TO PREVENT FALLS:  Life alert? No  Use of a cane, walker or w/c? No  Grab bars in the bathroom? Yes  Shower chair or bench in shower? Yes  Elevated toilet seat or a handicapped toilet? Yes   TIMED UP AND GO:  Was the test performed? No . .   Gait steady and fast without use of assistive device  Cognitive Function:     6CIT Screen 03/09/2019 03/03/2018  What Year? 0 points 0 points  What month? 0 points 0 points  What time? 0 points 0 points  Count back from 20 0 points 0 points  Months in reverse 0 points 0 points   Repeat phrase 0 points 0 points  Total Score 0 0    Immunizations Immunization History  Administered Date(s) Administered  . Fluad Quad(high Dose 65+) 11/18/2019  . Influenza, High Dose Seasonal PF 10/06/2018  . Influenza,inj,Quad PF,6+ Mos 11/05/2012  . Influenza-Unspecified 12/18/2017, 10/06/2018  . PFIZER(Purple Top)SARS-COV-2 Vaccination 03/17/2019, 03/31/2019, 10/08/2019  . Pneumococcal Conjugate-13 10/06/2018  . Pneumococcal Polysaccharide-23 03/03/2017  . Zoster Recombinat (Shingrix) 03/24/2017, 07/01/2017    TDAP status: Up to date  Flu Vaccine status: Up to date  Pneumococcal vaccine status: Up to date  Covid-19 vaccine status: Completed vaccines  Qualifies for Shingles Vaccine? Yes   Zostavax completed No   Shingrix Completed?: Yes  Screening Tests Health Maintenance  Topic Date Due  . Hepatitis C Screening  Never done  . FOOT EXAM  Never done  . MAMMOGRAM  03/29/2014  . COVID-19 Vaccine (4 - Booster) 04/09/2020  . HEMOGLOBIN A1C  06/19/2020  . OPHTHALMOLOGY EXAM  08/03/2020  . URINE MICROALBUMIN  08/15/2020  . TETANUS/TDAP  02/03/2021  . COLONOSCOPY (Pts 45-22yrs Insurance coverage will need to be confirmed)  04/29/2022  . INFLUENZA VACCINE  Completed  . DEXA SCAN  Completed  . PNA vac Low Risk Adult  Completed    Health Maintenance  Health Maintenance Due  Topic Date Due  . Hepatitis C Screening  Never done  . FOOT EXAM  Never done  . MAMMOGRAM  03/29/2014    Colorectal cancer screening: Type of screening: Colonoscopy. Completed 04/28/2012. Repeat every  10 years  Mammogram status: No longer required due to implants.  Bone Density status: Completed 03/01/2013.   Lung Cancer Screening: (Low Dose CT Chest recommended if Age 36-80 years, 30 pack-year currently smoking OR have quit w/in 15years.) does not qualify.   Lung Cancer Screening Referral: no  Additional Screening:  Hepatitis C Screening: does qualify; Completed today  Vision  Screening: Recommended annual ophthalmology exams for early detection of glaucoma and other disorders of the eye. Is the patient up to date with their annual eye exam?  Yes  Who is the provider or what is the name of the office in which the patient attends annual eye exams? Dr. Venetia Maxon If pt is not established with a provider, would they like to be referred to a provider to establish care? No .   Dental Screening: Recommended annual dental exams for proper oral hygiene  Community Resource Referral / Chronic Care Management: CRR required this visit?  No   CCM required this visit?  No      Plan:     I have personally reviewed and noted the following in the patient's chart:   . Medical and social history . Use of alcohol, tobacco or illicit drugs  . Current medications and supplements . Functional ability and status . Nutritional status . Physical activity . Advanced directives . List of other physicians . Hospitalizations, surgeries, and ER visits in previous 12 months . Vitals . Screenings to include cognitive, depression, and falls . Referrals and appointments  In addition, I have reviewed and discussed with patient certain preventive protocols, quality metrics, and best practice recommendations. A written personalized care plan for preventive services as well as general preventive health recommendations were provided to patient.     Kellie Simmering, LPN   [id number]   Nurse Notes:

## 2020-03-14 NOTE — Patient Instructions (Signed)
Diabetes Mellitus and Foot Care Foot care is an important part of your health, especially when you have diabetes. Diabetes may cause you to have problems because of poor blood flow (circulation) to your feet and legs, which can cause your skin to:  Become thinner and drier.  Break more easily.  Heal more slowly.  Peel and crack. You may also have nerve damage (neuropathy) in your legs and feet, causing decreased feeling in them. This means that you may not notice minor injuries to your feet that could lead to more serious problems. Noticing and addressing any potential problems early is the best way to prevent future foot problems. How to care for your feet Foot hygiene  Wash your feet daily with warm water and mild soap. Do not use hot water. Then, pat your feet and the areas between your toes until they are completely dry. Do not soak your feet as this can dry your skin.  Trim your toenails straight across. Do not dig under them or around the cuticle. File the edges of your nails with an emery board or nail file.  Apply a moisturizing lotion or petroleum jelly to the skin on your feet and to dry, brittle toenails. Use lotion that does not contain alcohol and is unscented. Do not apply lotion between your toes.   Shoes and socks  Wear clean socks or stockings every day. Make sure they are not too tight. Do not wear knee-high stockings since they may decrease blood flow to your legs.  Wear shoes that fit properly and have enough cushioning. Always look in your shoes before you put them on to be sure there are no objects inside.  To break in new shoes, wear them for just a few hours a day. This prevents injuries on your feet. Wounds, scrapes, corns, and calluses  Check your feet daily for blisters, cuts, bruises, sores, and redness. If you cannot see the bottom of your feet, use a mirror or ask someone for help.  Do not cut corns or calluses or try to remove them with medicine.  If you  find a minor scrape, cut, or break in the skin on your feet, keep it and the skin around it clean and dry. You may clean these areas with mild soap and water. Do not clean the area with peroxide, alcohol, or iodine.  If you have a wound, scrape, corn, or callus on your foot, look at it several times a day to make sure it is healing and not infected. Check for: ? Redness, swelling, or pain. ? Fluid or blood. ? Warmth. ? Pus or a bad smell.   General tips  Do not cross your legs. This may decrease blood flow to your feet.  Do not use heating pads or hot water bottles on your feet. They may burn your skin. If you have lost feeling in your feet or legs, you may not know this is happening until it is too late.  Protect your feet from hot and cold by wearing shoes, such as at the beach or on hot pavement.  Schedule a complete foot exam at least once a year (annually) or more often if you have foot problems. Report any cuts, sores, or bruises to your health care provider immediately. Where to find more information  American Diabetes Association: www.diabetes.org  Association of Diabetes Care & Education Specialists: www.diabeteseducator.org Contact a health care provider if:  You have a medical condition that increases your risk of infection and   you have any cuts, sores, or bruises on your feet.  You have an injury that is not healing.  You have redness on your legs or feet.  You feel burning or tingling in your legs or feet.  You have pain or cramps in your legs and feet.  Your legs or feet are numb.  Your feet always feel cold.  You have pain around any toenails. Get help right away if:  You have a wound, scrape, corn, or callus on your foot and: ? You have pain, swelling, or redness that gets worse. ? You have fluid or blood coming from the wound, scrape, corn, or callus. ? Your wound, scrape, corn, or callus feels warm to the touch. ? You have pus or a bad smell coming from  the wound, scrape, corn, or callus. ? You have a fever. ? You have a red line going up your leg. Summary  Check your feet every day for blisters, cuts, bruises, sores, and redness.  Apply a moisturizing lotion or petroleum jelly to the skin on your feet and to dry, brittle toenails.  Wear shoes that fit properly and have enough cushioning.  If you have foot problems, report any cuts, sores, or bruises to your health care provider immediately.  Schedule a complete foot exam at least once a year (annually) or more often if you have foot problems. This information is not intended to replace advice given to you by your health care provider. Make sure you discuss any questions you have with your health care provider. Document Revised: 08/25/2019 Document Reviewed: 08/25/2019 Elsevier Patient Education  2021 Elsevier Inc.  

## 2020-03-19 ENCOUNTER — Other Ambulatory Visit: Payer: Self-pay | Admitting: Plastic Surgery

## 2020-03-19 DIAGNOSIS — Z9889 Other specified postprocedural states: Secondary | ICD-10-CM | POA: Diagnosis not present

## 2020-03-19 DIAGNOSIS — T8549XA Other mechanical complication of breast prosthesis and implant, initial encounter: Secondary | ICD-10-CM | POA: Diagnosis not present

## 2020-03-19 DIAGNOSIS — Z853 Personal history of malignant neoplasm of breast: Secondary | ICD-10-CM | POA: Diagnosis not present

## 2020-03-23 ENCOUNTER — Other Ambulatory Visit: Payer: Self-pay | Admitting: Plastic Surgery

## 2020-03-23 DIAGNOSIS — Z853 Personal history of malignant neoplasm of breast: Secondary | ICD-10-CM

## 2020-03-26 ENCOUNTER — Other Ambulatory Visit: Payer: Self-pay | Admitting: Plastic Surgery

## 2020-03-26 DIAGNOSIS — Z853 Personal history of malignant neoplasm of breast: Secondary | ICD-10-CM

## 2020-03-26 DIAGNOSIS — T8543XA Leakage of breast prosthesis and implant, initial encounter: Secondary | ICD-10-CM

## 2020-03-28 ENCOUNTER — Other Ambulatory Visit: Payer: Medicare Other

## 2020-03-28 ENCOUNTER — Other Ambulatory Visit: Payer: Self-pay

## 2020-03-28 DIAGNOSIS — E1165 Type 2 diabetes mellitus with hyperglycemia: Secondary | ICD-10-CM | POA: Diagnosis not present

## 2020-03-28 DIAGNOSIS — E7800 Pure hypercholesterolemia, unspecified: Secondary | ICD-10-CM

## 2020-03-29 ENCOUNTER — Ambulatory Visit
Admission: RE | Admit: 2020-03-29 | Discharge: 2020-03-29 | Disposition: A | Discharge status: Home / Self Care | Payer: Medicare Other | Source: Ambulatory Visit | Attending: Plastic Surgery | Admitting: Plastic Surgery

## 2020-03-29 DIAGNOSIS — Z853 Personal history of malignant neoplasm of breast: Secondary | ICD-10-CM

## 2020-03-29 DIAGNOSIS — T8543XA Leakage of breast prosthesis and implant, initial encounter: Secondary | ICD-10-CM

## 2020-03-29 DIAGNOSIS — Z9013 Acquired absence of bilateral breasts and nipples: Secondary | ICD-10-CM | POA: Diagnosis not present

## 2020-03-29 DIAGNOSIS — N6489 Other specified disorders of breast: Secondary | ICD-10-CM | POA: Diagnosis not present

## 2020-03-29 DIAGNOSIS — Z9882 Breast implant status: Secondary | ICD-10-CM | POA: Diagnosis not present

## 2020-03-29 LAB — CMP14+EGFR
ALT: 15 IU/L (ref 0–32)
AST: 17 IU/L (ref 0–40)
Albumin/Globulin Ratio: 2.1 (ref 1.2–2.2)
Albumin: 4.5 g/dL (ref 3.8–4.8)
Alkaline Phosphatase: 113 IU/L (ref 44–121)
BUN/Creatinine Ratio: 23 (ref 12–28)
BUN: 15 mg/dL (ref 8–27)
Bilirubin Total: 0.4 mg/dL (ref 0.0–1.2)
CO2: 25 mmol/L (ref 20–29)
Calcium: 10.7 mg/dL — ABNORMAL HIGH (ref 8.7–10.3)
Chloride: 101 mmol/L (ref 96–106)
Creatinine, Ser: 0.64 mg/dL (ref 0.57–1.00)
GFR calc Af Amer: 105 mL/min/{1.73_m2} (ref >59)
GFR calc non Af Amer: 91 mL/min/{1.73_m2} (ref >59)
Globulin, Total: 2.1 g/dL (ref 1.5–4.5)
Glucose: 110 mg/dL — ABNORMAL HIGH (ref 65–99)
Potassium: 4.6 mmol/L (ref 3.5–5.2)
Sodium: 142 mmol/L (ref 134–144)
Total Protein: 6.6 g/dL (ref 6.0–8.5)

## 2020-03-29 LAB — HEMOGLOBIN A1C
Est. average glucose Bld gHb Est-mCnc: 134 mg/dL
Hgb A1c MFr Bld: 6.3 % — ABNORMAL HIGH (ref 4.8–5.6)

## 2020-03-29 LAB — LIPID PANEL
Chol/HDL Ratio: 2.2 ratio (ref 0.0–4.4)
Cholesterol, Total: 160 mg/dL (ref 100–199)
HDL: 73 mg/dL (ref >39)
LDL Chol Calc (NIH): 54 mg/dL (ref 0–99)
Triglycerides: 205 mg/dL — ABNORMAL HIGH (ref 0–149)
VLDL Cholesterol Cal: 33 mg/dL (ref 5–40)

## 2020-04-01 IMAGING — US US AORTA
1 series · 14 of 15 positions shown · non-contrast
Comparison: None.

EXAM:
ULTRASOUND OF ABDOMINAL AORTA
TECHNIQUE: Ultrasound examination of the abdominal aorta and proximal common
iliac arteries was performed to evaluate for aneurysm. Additional
color and Doppler images of the distal aorta were obtained to
document patency.

[Series 1: us aorta · 0.20mm/px · 14 of 15 slices shown]
[im 1/15]
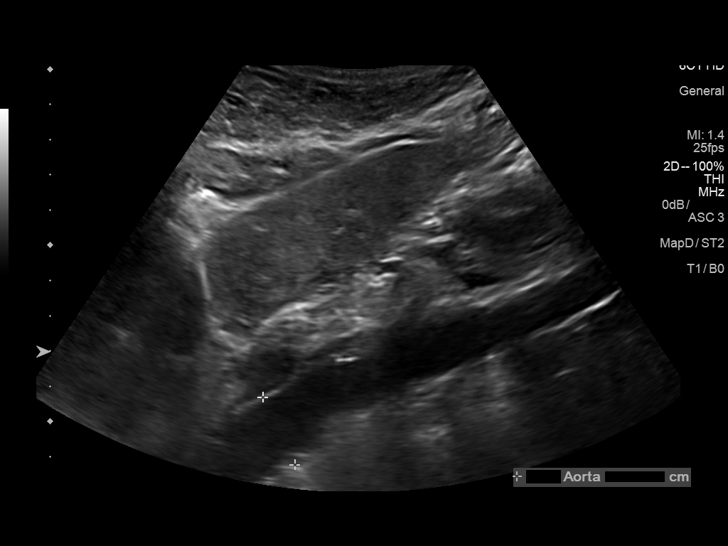
[im 2/15]
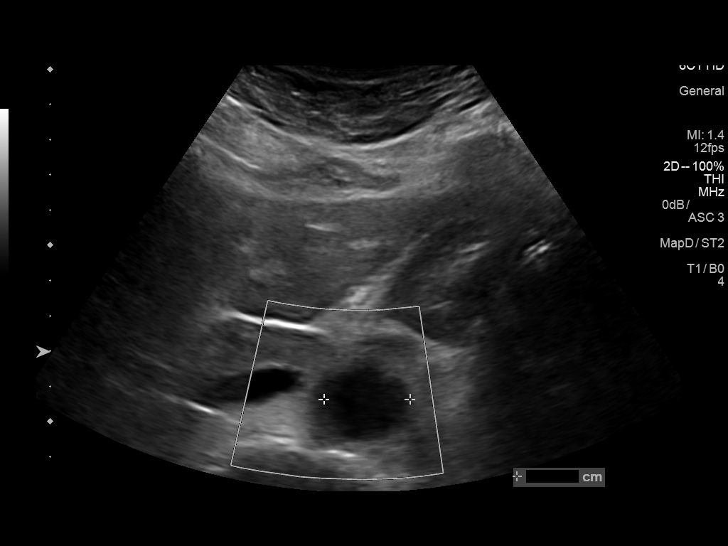
[im 3/15]
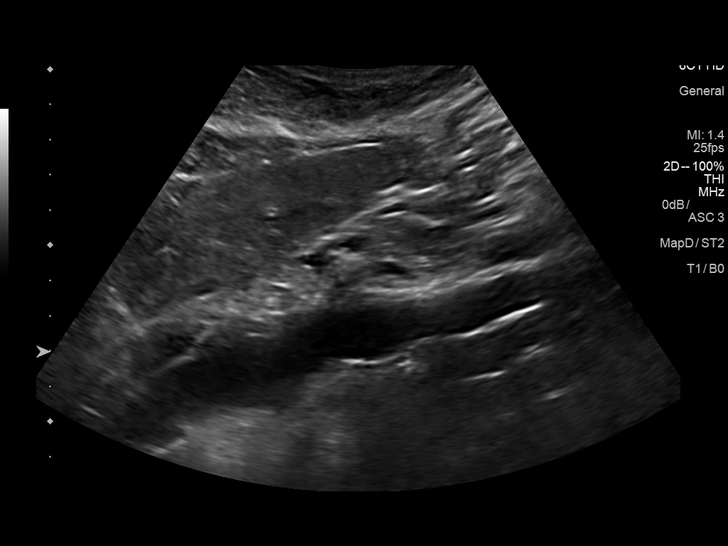
[im 4/15]
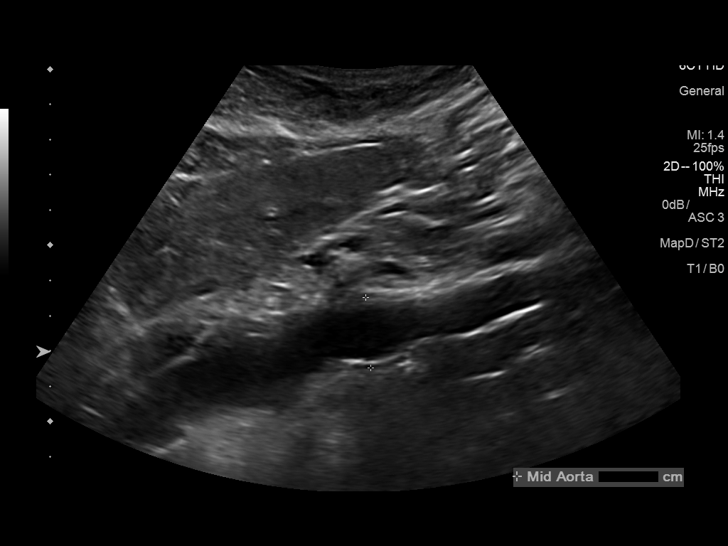
[im 5/15]
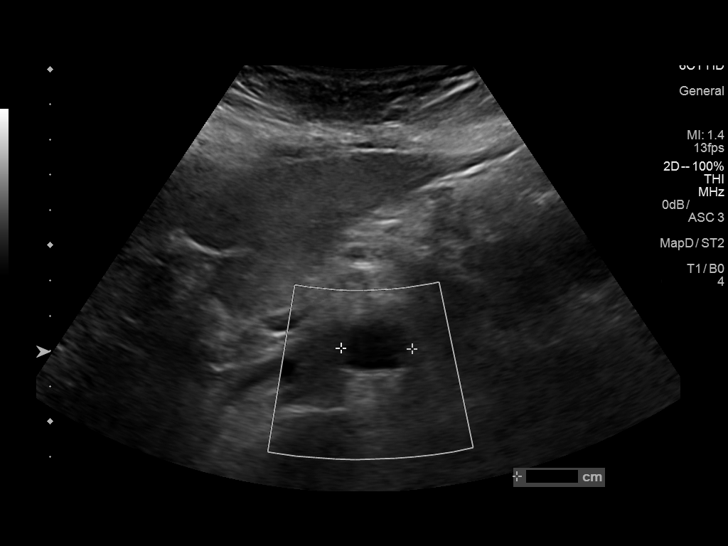
[im 6/15]
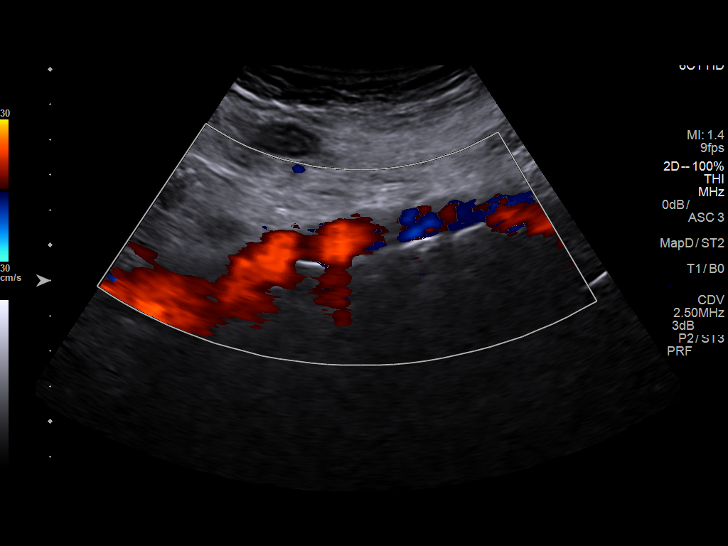
[im 7/15]
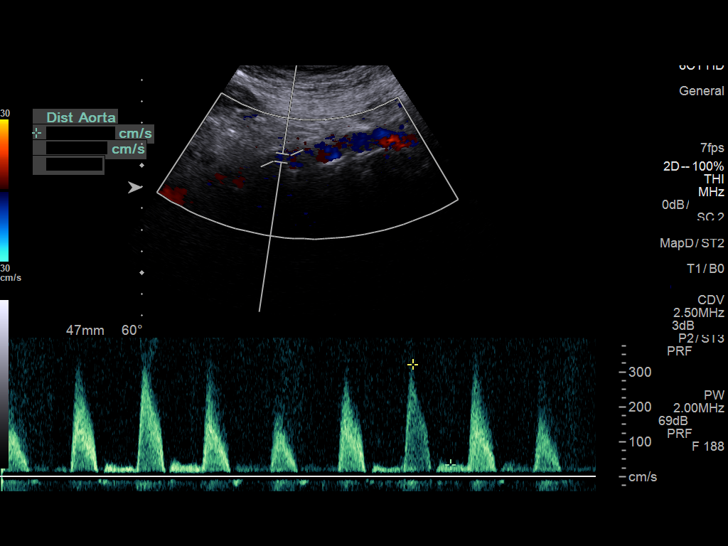
[im 9/15]
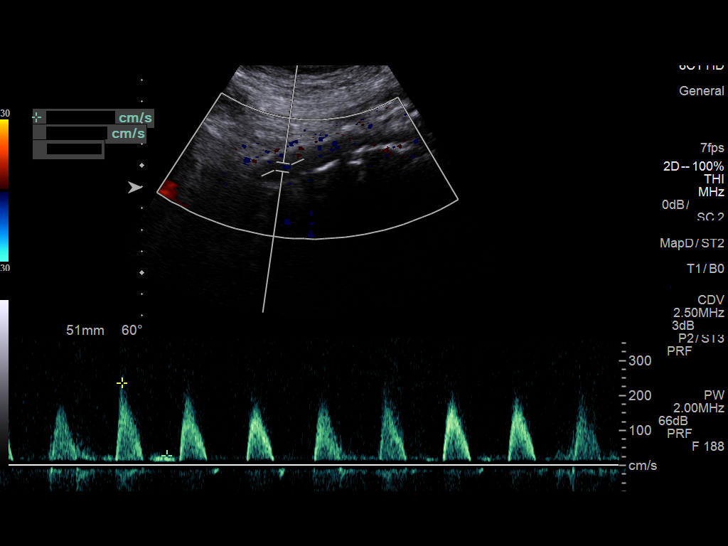
[im 10/15]
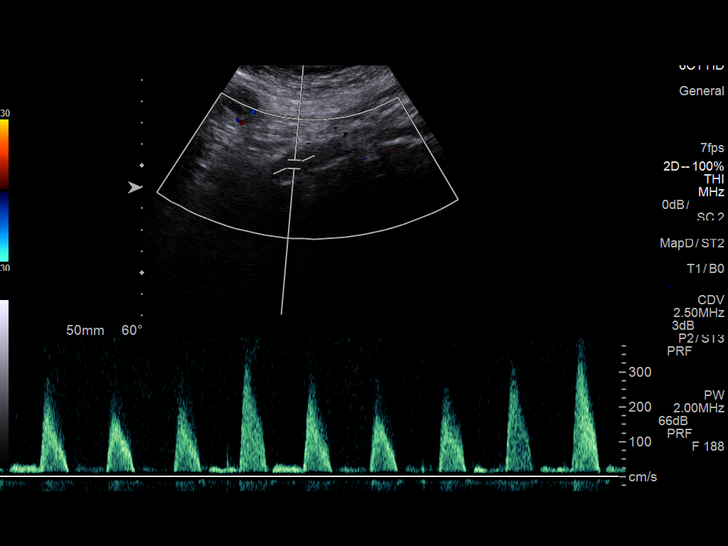
[im 11/15]
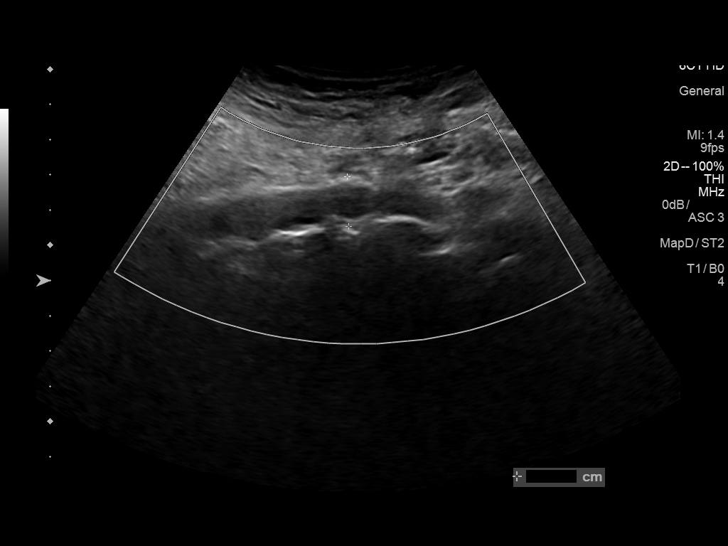
[im 12/15]
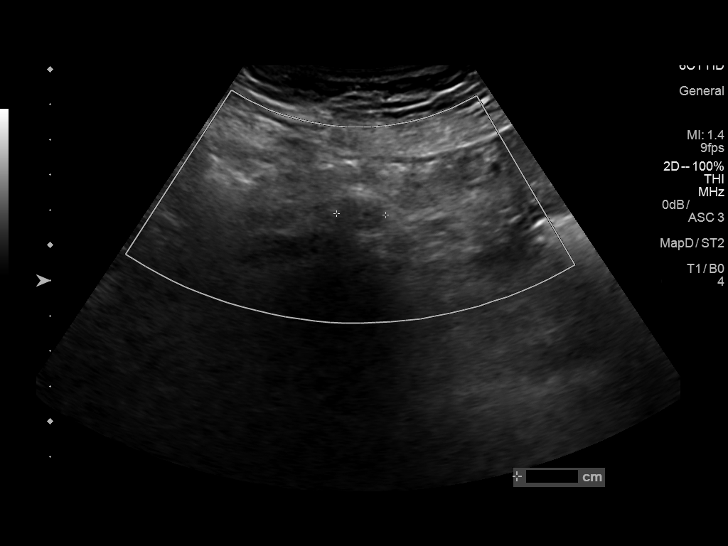
[im 13/15]
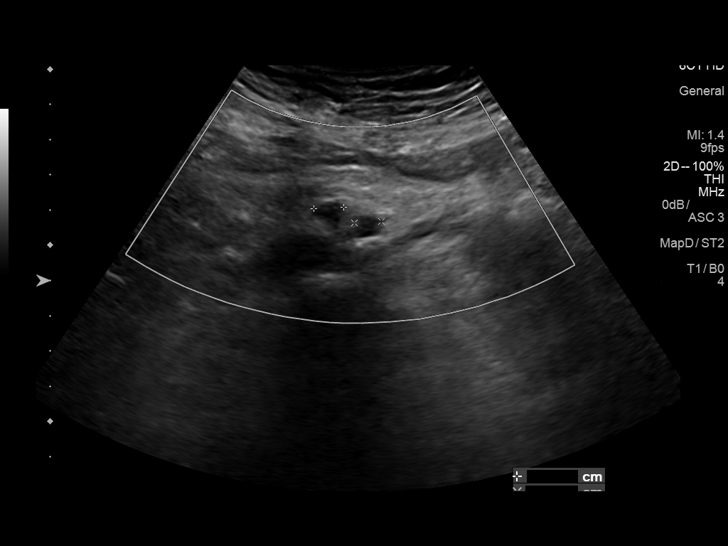
[im 14/15]
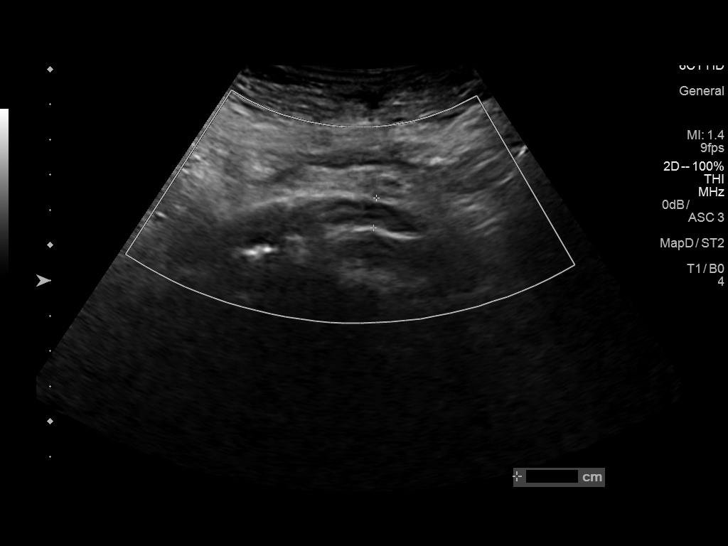
[im 15/15]
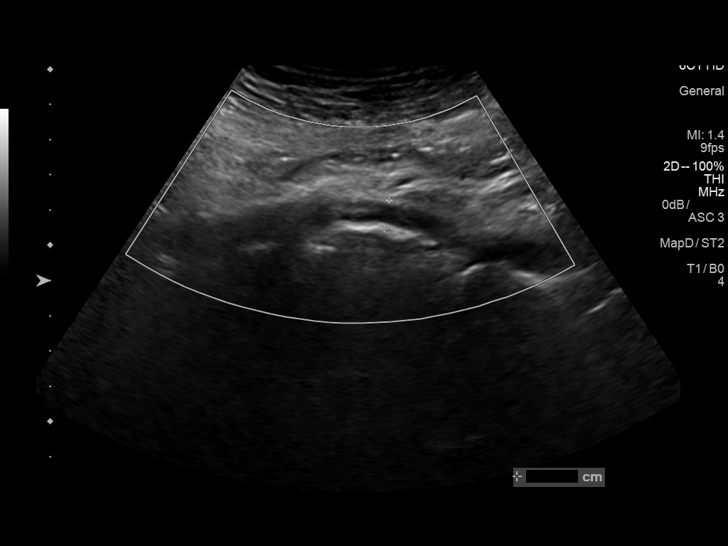

[14 of 15 positions shown; findings below may reference images not displayed]

FINDINGS: Abdominal aortic measurements as follows:

Proximal:  2.4 cm

Mid:  2.2 cm

Distal:  1.4 cm
Patent: Yes, peak systolic velocity is 131 cm/s

Mild calcified plaque

Right common iliac artery: 0.9 cm

Left common iliac artery: 0.8 cm
IMPRESSION: No aortic aneurysm.  Mild calcified plaque.

## 2020-04-07 DIAGNOSIS — T8543XA Leakage of breast prosthesis and implant, initial encounter: Secondary | ICD-10-CM | POA: Insufficient documentation

## 2020-04-07 DIAGNOSIS — Z6828 Body mass index (BMI) 28.0-28.9, adult: Secondary | ICD-10-CM | POA: Insufficient documentation

## 2020-04-07 DIAGNOSIS — E1165 Type 2 diabetes mellitus with hyperglycemia: Secondary | ICD-10-CM | POA: Insufficient documentation

## 2020-04-07 DIAGNOSIS — E663 Overweight: Secondary | ICD-10-CM | POA: Insufficient documentation

## 2020-04-07 DIAGNOSIS — Z6829 Body mass index (BMI) 29.0-29.9, adult: Secondary | ICD-10-CM | POA: Insufficient documentation

## 2020-04-07 MED ORDER — RYBELSUS 7 MG PO TABS: 1.0000 | ORAL_TABLET | Freq: Every morning | ORAL | 2 refills | Status: DC

## 2020-04-09 DIAGNOSIS — Z9889 Other specified postprocedural states: Secondary | ICD-10-CM | POA: Diagnosis not present

## 2020-04-09 DIAGNOSIS — Z9882 Breast implant status: Secondary | ICD-10-CM | POA: Diagnosis not present

## 2020-05-24 DIAGNOSIS — Z23 Encounter for immunization: Secondary | ICD-10-CM | POA: Diagnosis not present

## 2020-07-16 ENCOUNTER — Other Ambulatory Visit: Payer: Self-pay | Admitting: Internal Medicine

## 2020-08-06 ENCOUNTER — Other Ambulatory Visit: Payer: Self-pay | Admitting: Internal Medicine

## 2020-08-07 ENCOUNTER — Encounter: Payer: Self-pay | Admitting: Internal Medicine

## 2020-08-07 ENCOUNTER — Other Ambulatory Visit: Payer: Self-pay | Admitting: Internal Medicine

## 2020-08-07 MED ORDER — RYBELSUS 7 MG PO TABS: 1.0000 | ORAL_TABLET | Freq: Every morning | ORAL | 2 refills | Status: DC

## 2020-08-08 ENCOUNTER — Other Ambulatory Visit: Payer: Self-pay

## 2020-08-09 ENCOUNTER — Telehealth: Payer: Self-pay

## 2020-08-09 NOTE — Telephone Encounter (Signed)
Prior auth started for rybelsus 7mg  waiting on a reply form the pt's insurance.

## 2020-08-09 NOTE — Telephone Encounter (Signed)
The pt was notified that her rybelsus medication has been approved for coverage for 1 year.

## 2020-08-14 ENCOUNTER — Ambulatory Visit: Payer: Medicare Other | Admitting: Internal Medicine

## 2020-08-21 ENCOUNTER — Ambulatory Visit: Payer: Medicare Other | Admitting: Internal Medicine

## 2020-08-28 ENCOUNTER — Encounter: Payer: Self-pay | Admitting: Internal Medicine

## 2020-08-28 ENCOUNTER — Ambulatory Visit (INDEPENDENT_AMBULATORY_CARE_PROVIDER_SITE_OTHER): Payer: Medicare Other | Admitting: Internal Medicine

## 2020-08-28 ENCOUNTER — Other Ambulatory Visit: Payer: Self-pay

## 2020-08-28 VITALS — BP 122/78 | HR 88 | Temp 98.4°F | Ht 64.0 in | Wt 163.8 lb

## 2020-08-28 DIAGNOSIS — T451X5A Adverse effect of antineoplastic and immunosuppressive drugs, initial encounter: Secondary | ICD-10-CM

## 2020-08-28 DIAGNOSIS — E7800 Pure hypercholesterolemia, unspecified: Secondary | ICD-10-CM

## 2020-08-28 DIAGNOSIS — E1169 Type 2 diabetes mellitus with other specified complication: Secondary | ICD-10-CM | POA: Diagnosis not present

## 2020-08-28 DIAGNOSIS — E785 Hyperlipidemia, unspecified: Secondary | ICD-10-CM | POA: Diagnosis not present

## 2020-08-28 DIAGNOSIS — I427 Cardiomyopathy due to drug and external agent: Secondary | ICD-10-CM

## 2020-08-28 DIAGNOSIS — E1165 Type 2 diabetes mellitus with hyperglycemia: Secondary | ICD-10-CM

## 2020-08-28 LAB — POCT UA - MICROALBUMIN
Albumin/Creatinine Ratio, Urine, POC: <30
Creatinine, POC: 200 mg/dL
Microalbumin Ur, POC: 30 mg/L

## 2020-08-28 LAB — POCT URINALYSIS DIPSTICK
Bilirubin, UA: NEGATIVE
Blood, UA: NEGATIVE
Glucose, UA: NEGATIVE
Ketones, UA: NEGATIVE
Nitrite, UA: NEGATIVE
Protein, UA: NEGATIVE
Spec Grav, UA: 1.015 (ref 1.010–1.025)
Urobilinogen, UA: 0.2 E.U./dL
pH, UA: 5.5 (ref 5.0–8.0)

## 2020-08-28 NOTE — Patient Instructions (Signed)

## 2020-08-28 NOTE — Progress Notes (Signed)
+3 I,Tianna Badgett,acting as a Education administrator for [name], MD.,have documented all relevant documentation on the behalf of [name], MD,as directed by  [name], MD while in the presence of [name], MD.  This visit occurred during the SARS-CoV-2 public health emergency.  Safety protocols were in place, including screening questions prior to the visit, additional usage of staff PPE, and extensive cleaning of exam room while observing appropriate contact time as indicated for disinfecting solutions.  Subjective:     Patient ID: [name] [name] , female    DOB: [birth year] , 70 y.o.   MRN: [id number]   Chief Complaint  Patient presents with   Diabetes    HPI   She presents today for diabetes check and cholesterol f/u. She reports compliance with meds. She has been doing well with Rybelsus. She has not had any issues with the medication. She reports she has been exercising regularly.   Diabetes She presents for her follow-up diabetic visit. She has type 2 diabetes mellitus. There are no hypoglycemic associated symptoms. There are no diabetic associated symptoms. There are no diabetic complications. Risk factors for coronary artery disease include diabetes mellitus, dyslipidemia, hypertension and post-menopausal. She participates in exercise daily. An ACE inhibitor/angiotensin II receptor blocker is not being taken.  Hyperlipidemia This is a chronic problem. The current episode started more than 1 year ago. The problem is controlled. Exacerbating diseases include diabetes. Pertinent negatives include no myalgias. Current antihyperlipidemic treatment includes statins. The current treatment provides moderate improvement of lipids. There are no compliance problems.  Risk factors for coronary artery disease include post-menopausal, diabetes mellitus and dyslipidemia.    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 experiencing on occasional , h/o og gastroparesis    Heart murmur    Hiatal hernia    Osteopenia    Pneumonia    treated as an outpt.    Sinus problem    Sleep apnea    sleep minimal concern- " along time ago"     Family History  Problem Relation Age of Onset   Heart attack Father    Diabetes Father    Heart attack Brother    Diabetes Brother    Diabetes Sister    Breast cancer Sister 45   Diabetes Mother    Breast cancer Mother 27   Parkinson's disease Sister    Heart attack Brother      Current Outpatient Medications:    Ascorbic Acid (VITAMIN C) 500 MG tablet, Take 500 mg by mouth daily., Disp: , Rfl:    Cholecalciferol (VITAMIN D PO), Take 1 tablet by mouth daily., Disp: , Rfl:    CRESTOR 5 MG tablet, Take 1 tablet by mouth once daily, Disp: 90 tablet, Rfl: 0   Dexlansoprazole 30 MG capsule, Take 30 mg by mouth daily., Disp: , Rfl:    ELDERBERRY PO, Take by mouth., Disp: , Rfl:    GARLIC PO, Take by mouth., Disp: , Rfl:    glucose blood (CONTOUR NEXT TEST)  test strip, Use as instructed TO TEST BLOOD SUGARS ONCE DAILY. DX: E11.9, Disp: 100 each, Rfl: 0   KRILL OIL PO, Take 1 capsule by mouth daily. , Disp: , Rfl:    Magnesium Oxide 250 MG TABS, Take 400 mg by mouth daily. , Disp: , Rfl:    Menthol, Topical Analgesic, (BLUE-EMU MAXIMUM STRENGTH EX), Apply 1 application topically daily., Disp: , Rfl:    Multiple Vitamin (MULTIVITAMIN PO), Take 1 tablet by mouth daily., Disp: , Rfl:    Probiotic Product (PROBIOTIC PO), Take 1 capsule by mouth daily., Disp: , Rfl:    DEXILANT 60 MG capsule, Take 1 capsule by mouth every morning., Disp: , Rfl:    Semaglutide  (RYBELSUS) 7 MG TABS, Take 1 tablet by mouth every morning., Disp: 90 tablet, Rfl: 2   Allergies  Allergen Reactions   Darvon Hives and Itching   Penicillins Hives and Itching    Has patient had a PCN reaction causing immediate rash, facial/tongue/throat swelling, SOB or lightheadedness with hypotension: No Has patient had a PCN reaction causing severe rash involving mucus membranes or skin necrosis: No Has patient had a PCN reaction that required hospitalization: No Has patient had a PCN reaction occurring within the last 10 years: No If all of the above answers are "NO", then may proceed with Cephalosporin use.      Review of Systems  Constitutional: Negative.   Respiratory: Negative.    Cardiovascular: Negative.   Gastrointestinal: Negative.   Musculoskeletal:  Negative for myalgias.  Neurological: Negative.     Today's Vitals   08/28/20 1612  BP: 122/78  Pulse: 88  Temp: 98.4 F (36.9 C)  Weight: 163 lb 12.8 oz (74.3 kg)  Height: '5\' 4"'  (1.626 m)  PainSc: 0-No pain   Body mass index is 28.12 kg/m.  Wt Readings from Last 3 Encounters:  08/28/20 163 lb 12.8 oz (74.3 kg)  03/14/20 163 lb 9.6 oz (74.2 kg)  03/14/20 163 lb 9.6 oz (74.2 kg)    Objective:  Physical Exam Vitals and nursing note reviewed.  Constitutional:      Appearance: Normal appearance.  HENT:     Head: Normocephalic and atraumatic.     Nose:     Comments: Masked     Mouth/Throat:     Comments: Masked  Cardiovascular:     Rate and Rhythm: Normal rate and regular rhythm.     Pulses:          Dorsalis pedis pulses are 2+ on the right side and 2+ on the left side.     Heart sounds: Normal heart sounds.  Pulmonary:     Effort: Pulmonary effort is normal.     Breath sounds: Normal breath sounds.  Musculoskeletal:     Cervical back: Normal range of motion.  Feet:     Right foot:     Protective Sensation: 5 sites tested.  5 sites sensed.     Skin integrity: Skin integrity normal.     Toenail  Condition: Right toenails are normal.     Left foot:     Protective Sensation: 5 sites tested.  5 sites sensed.     Skin integrity: Skin integrity normal.     Toenail Condition: Left toenails are normal.  Skin:    General: Skin is warm.  Neurological:     General: No focal deficit present.     Mental Status: She is alert.  Psychiatric:        Mood and  Affect: Mood normal.        Behavior: Behavior normal.        Assessment And Plan:     1. Type 2 diabetes mellitus with hyperlipidemia (HCC) Comments: Diabetic foot exam was performed. I will check labs as listed below. She will f/u in 4 months for re-evaluation. - CMP14+EGFR - Hemoglobin A1c - CBC no Diff - POCT Urinalysis Dipstick (81002) - POCT UA - Microalbumin  2. Hypercalcemia Comments: I will check labs as listed below.  - Parathyroid Hormone, Intact w/Ca  3. Chemotherapy induced cardiomyopathy ([place]) Comments: Resolved. Cardiac note from May 2021 was reviewed in full detail.    Patient was given opportunity to ask questions. Patient verbalized understanding of the plan and was able to repeat key elements of the plan. All questions were answered to their satisfaction.   I, [name], MD, have reviewed all documentation for this visit. The documentation on 08/28/20 for the exam, diagnosis, procedures, and orders are all accurate and complete.   IF YOU HAVE BEEN REFERRED TO A SPECIALIST, IT MAY TAKE 1-2 WEEKS TO SCHEDULE/PROCESS THE REFERRAL. IF YOU HAVE NOT HEARD FROM US/SPECIALIST IN TWO WEEKS, PLEASE GIVE [place] A CALL AT [phone].   THE PATIENT IS ENCOURAGED TO PRACTICE SOCIAL DISTANCING DUE TO THE COVID-19 PANDEMIC.

## 2020-08-29 LAB — PTH, INTACT AND CALCIUM: PTH: 55 pg/mL (ref 15–65)

## 2020-08-29 LAB — CBC
Hematocrit: 43.5 % (ref 34.0–46.6)
Hemoglobin: 14.2 g/dL (ref 11.1–15.9)
MCH: 29.5 pg (ref 26.6–33.0)
MCHC: 32.6 g/dL (ref 31.5–35.7)
MCV: 90 fL (ref 79–97)
Platelets: 277 10*3/uL (ref 150–450)
RBC: 4.82 x10E6/uL (ref 3.77–5.28)
RDW: 12.3 % (ref 11.7–15.4)
WBC: 4.1 10*3/uL (ref 3.4–10.8)

## 2020-08-29 LAB — CMP14+EGFR
ALT: 21 IU/L (ref 0–32)
AST: 23 IU/L (ref 0–40)
Albumin/Globulin Ratio: 2.1 (ref 1.2–2.2)
Albumin: 4.5 g/dL (ref 3.8–4.8)
Alkaline Phosphatase: 102 IU/L (ref 44–121)
BUN/Creatinine Ratio: 16 (ref 12–28)
BUN: 11 mg/dL (ref 8–27)
Bilirubin Total: 0.3 mg/dL (ref 0.0–1.2)
CO2: 27 mmol/L (ref 20–29)
Calcium: 10.5 mg/dL — ABNORMAL HIGH (ref 8.7–10.3)
Chloride: 102 mmol/L (ref 96–106)
Creatinine, Ser: 0.68 mg/dL (ref 0.57–1.00)
Globulin, Total: 2.1 g/dL (ref 1.5–4.5)
Glucose: 121 mg/dL — ABNORMAL HIGH (ref 65–99)
Potassium: 4.5 mmol/L (ref 3.5–5.2)
Sodium: 144 mmol/L (ref 134–144)
Total Protein: 6.6 g/dL (ref 6.0–8.5)
eGFR: 94 mL/min/{1.73_m2} (ref >59)

## 2020-08-29 LAB — HEMOGLOBIN A1C
Est. average glucose Bld gHb Est-mCnc: 134 mg/dL
Hgb A1c MFr Bld: 6.3 % — ABNORMAL HIGH (ref 4.8–5.6)

## 2020-08-30 ENCOUNTER — Other Ambulatory Visit: Payer: Self-pay

## 2020-08-30 MED ORDER — RYBELSUS 7 MG PO TABS
1.0000 | ORAL_TABLET | Freq: Every morning | ORAL | 2 refills | Status: DC
Start: 2020-08-30 — End: 2021-03-21

## 2020-09-04 ENCOUNTER — Other Ambulatory Visit: Payer: Self-pay

## 2020-09-04 ENCOUNTER — Ambulatory Visit: Payer: Medicare Other

## 2020-09-04 VITALS — BP 124/80 | HR 85 | Temp 98.1°F | Ht 64.0 in

## 2020-09-04 DIAGNOSIS — Z20822 Contact with and (suspected) exposure to covid-19: Secondary | ICD-10-CM

## 2020-09-04 NOTE — Progress Notes (Signed)
Pt here for covid test.

## 2020-09-05 DIAGNOSIS — I10 Essential (primary) hypertension: Secondary | ICD-10-CM | POA: Diagnosis not present

## 2020-09-05 DIAGNOSIS — E119 Type 2 diabetes mellitus without complications: Secondary | ICD-10-CM | POA: Diagnosis not present

## 2020-09-05 DIAGNOSIS — Z1152 Encounter for screening for COVID-19: Secondary | ICD-10-CM | POA: Diagnosis not present

## 2020-09-11 ENCOUNTER — Telehealth: Payer: Self-pay

## 2020-09-11 NOTE — Telephone Encounter (Signed)
I called the pt because Dr Baird Cancer wanted to check on the pt and the pt said that she is doing good and to let dr sanders know she appreciates her.

## 2020-09-14 DIAGNOSIS — U071 COVID-19: Secondary | ICD-10-CM | POA: Diagnosis not present

## 2020-09-18 DIAGNOSIS — R112 Nausea with vomiting, unspecified: Secondary | ICD-10-CM | POA: Diagnosis not present

## 2020-09-18 DIAGNOSIS — K219 Gastro-esophageal reflux disease without esophagitis: Secondary | ICD-10-CM | POA: Diagnosis not present

## 2020-09-18 DIAGNOSIS — K5900 Constipation, unspecified: Secondary | ICD-10-CM | POA: Diagnosis not present

## 2020-10-02 DIAGNOSIS — H0015 Chalazion left lower eyelid: Secondary | ICD-10-CM | POA: Diagnosis not present

## 2020-10-15 ENCOUNTER — Other Ambulatory Visit: Payer: Self-pay | Admitting: Internal Medicine

## 2020-10-29 DIAGNOSIS — U071 COVID-19: Secondary | ICD-10-CM | POA: Diagnosis not present

## 2020-11-06 DIAGNOSIS — Z01411 Encounter for gynecological examination (general) (routine) with abnormal findings: Secondary | ICD-10-CM | POA: Diagnosis not present

## 2020-11-06 DIAGNOSIS — Z01419 Encounter for gynecological examination (general) (routine) without abnormal findings: Secondary | ICD-10-CM | POA: Diagnosis not present

## 2020-11-06 DIAGNOSIS — Z124 Encounter for screening for malignant neoplasm of cervix: Secondary | ICD-10-CM | POA: Diagnosis not present

## 2020-11-14 DIAGNOSIS — Z23 Encounter for immunization: Secondary | ICD-10-CM | POA: Diagnosis not present

## 2020-11-27 DIAGNOSIS — Z23 Encounter for immunization: Secondary | ICD-10-CM | POA: Diagnosis not present

## 2020-11-27 DIAGNOSIS — U071 COVID-19: Secondary | ICD-10-CM | POA: Diagnosis not present

## 2021-01-13 ENCOUNTER — Other Ambulatory Visit: Payer: Self-pay | Admitting: Internal Medicine

## 2021-01-14 ENCOUNTER — Other Ambulatory Visit: Payer: Self-pay

## 2021-01-15 ENCOUNTER — Other Ambulatory Visit: Payer: Self-pay

## 2021-01-15 MED ORDER — CRESTOR 5 MG PO TABS: 5.0000 mg | ORAL_TABLET | Freq: Every day | ORAL | 0 refills | Status: DC

## 2021-01-16 ENCOUNTER — Other Ambulatory Visit: Payer: Self-pay

## 2021-01-16 ENCOUNTER — Telehealth (INDEPENDENT_AMBULATORY_CARE_PROVIDER_SITE_OTHER): Payer: Medicare Other | Admitting: Nurse Practitioner

## 2021-01-16 ENCOUNTER — Encounter: Payer: Self-pay | Admitting: Nurse Practitioner

## 2021-01-16 VITALS — BP 134/95 | HR 74 | Temp 96.7°F | Ht 64.0 in | Wt 161.0 lb

## 2021-01-16 DIAGNOSIS — R051 Acute cough: Secondary | ICD-10-CM

## 2021-01-16 DIAGNOSIS — R0981 Nasal congestion: Secondary | ICD-10-CM | POA: Diagnosis not present

## 2021-01-16 DIAGNOSIS — U071 COVID-19: Secondary | ICD-10-CM | POA: Diagnosis not present

## 2021-01-16 MED ORDER — AZITHROMYCIN 250 MG PO TABS: ORAL_TABLET | ORAL | 0 refills | Status: AC

## 2021-01-16 MED ORDER — BENZONATATE 100 MG PO CAPS: 100.0000 mg | ORAL_CAPSULE | Freq: Three times a day (TID) | ORAL | 0 refills | Status: DC | PRN

## 2021-01-16 NOTE — Progress Notes (Addendum)
Virtual Visit via phone call    This visit type was conducted due to national recommendations for restrictions regarding the COVID-19 Pandemic (e.g. social distancing) in an effort to limit this patient's exposure and mitigate transmission in our community.  Due to her co-morbid illnesses, this patient is at least at moderate risk for complications without adequate follow up.  This format is felt to be most appropriate for this patient at this time.  All issues noted in this document were discussed and addressed.  A limited physical exam was performed with this format.    This visit type was conducted due to national recommendations for restrictions regarding the COVID-19 Pandemic (e.g. social distancing) in an effort to limit this patient's exposure and mitigate transmission in our community.  Patients identity confirmed using two different identifiers.  This format is felt to be most appropriate for this patient at this time.  All issues noted in this document were discussed and addressed.  No physical exam was performed (except for noted visual exam findings with Video Visits).    Date:  01/16/2021   ID:  [name] [name], DOB 12-25-1950, MRN [id number]  Patient Location:  Home  Provider location:   Office   Chief Complaint:  Cough, congestion, fatigue, HA   History of Present Illness:    [name] G Stmartin is a 70 y.o. female who presents via phone conferencing for a telehealth visit today.    The patient does have symptoms concerning for COVID-19 infection (fever, chills, cough, or new shortness of breath).   She did the covid test yesterday and it was neg. Symptoms started on Monday. Cough, HA, pain behind the eyes, watery eyes, congestion, NO fever, SOB, chest pain. It is productive cough. Some fatigue. No sore throat.     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 experiencing on occasional , h/o og gastroparesis    Heart murmur    Hiatal hernia    Osteopenia    Pneumonia    treated as an outpt.    Sinus problem    Sleep apnea    sleep minimal concern- " along time ago"   Past Surgical History:  Procedure Laterality Date   AXILLARY SENTINEL NODE BIOPSY Left 05/14/2012   Procedure: AXILLARY SENTINEL NODE BIOPSY;  Surgeon: Adin Hector, MD;  Location: [facility];  Service: General;  Laterality: Left;   BREAST RECONSTRUCTION WITH PLACEMENT OF TISSUE EXPANDER AND FLEX HD (ACELLULAR HYDRATED DERMIS) Bilateral 05/14/2012   Procedure: BREAST RECONSTRUCTION WITH PLACEMENT OF TISSUE EXPANDER AND FLEX HD (ACELLULAR HYDRATED DERMIS);  Surgeon: Crissie Reese, MD;  Location: [facility];  Service: Plastics;  Laterality: Bilateral;   BUNIONECTOMY Bilateral    CESAREAN SECTION  1981, 1983   FOOT SURGERY     GASTRIC BYPASS     1st surgery- at [facility], then an emergent surgery here  [facility]- for perforation of stomach & then a surgery for debridement    PORT-A-CATH REMOVAL Right 09/12/2013   Procedure: REMOVAL PORT-A-CATH;  Surgeon: Adin Hector,  MD;  Location: [place];  Service: General;  Laterality: Right;   PORTACATH PLACEMENT N/A 05/14/2012   Procedure: INSERTION PORT-A-CATH;  Surgeon: Adin Hector, MD;  Location: [place];  Service: General;  Laterality: N/A;   SHOULDER SURGERY Left 06/2019   TONSILLECTOMY     TOTAL HIP ARTHROPLASTY Right 03/02/2017   TOTAL HIP ARTHROPLASTY Right 03/02/2017   Procedure: RIGHT TOTAL HIP ARTHROPLASTY ANTERIOR APPROACH;  Surgeon: Frederik Pear, MD;  Location: [place];  Service: Orthopedics;  Laterality: Right;   TOTAL MASTECTOMY Bilateral 05/14/2012    Procedure: TOTAL MASTECTOMY;  Surgeon: Adin Hector, MD;  Location: [place] [place];  Service: General;  Laterality: Bilateral;   tummy tuck  1999   WOUND DEBRIDEMENT  01/14/2011   Procedure: DEBRIDEMENT ABDOMINAL WOUND;  Surgeon: Adin Hector, MD;  Location: [place];  Service: General;  Laterality: N/A;  wound debridement and debridement on the abdomen     Current Meds  Medication Sig   Ascorbic Acid (VITAMIN C) 500 MG tablet Take 500 mg by mouth daily.   azithromycin (ZITHROMAX) 250 MG tablet Take 2 tablets (500 mg) on  Day 1,  followed by 1 tablet (250 mg) once daily on Days 2 through 5.   benzonatate (TESSALON PERLES) 100 MG capsule Take 1 capsule (100 mg total) by mouth 3 (three) times daily as needed for cough.   Cholecalciferol (VITAMIN D PO) Take 1 tablet by mouth daily.   CRESTOR 5 MG tablet Take 1 tablet (5 mg total) by mouth daily.   Dexlansoprazole 30 MG capsule Take 30 mg by mouth daily.   ELDERBERRY PO Take by mouth.   glucose blood (CONTOUR NEXT TEST) test strip Use as instructed TO TEST BLOOD SUGARS ONCE DAILY. DX: E11.9   KRILL OIL PO Take 1 capsule by mouth daily.    Magnesium Oxide 250 MG TABS Take 400 mg by mouth daily.    Multiple Vitamin (MULTIVITAMIN PO) Take 1 tablet by mouth daily.   Probiotic Product (PROBIOTIC PO) Take 1 capsule by mouth daily.   Semaglutide (RYBELSUS) 7 MG TABS Take 1 tablet by mouth every morning.     Allergies:   Darvon and Penicillins   Social History   Tobacco Use   Smoking status: Former    Types: Cigarettes    Quit date: 02/25/2006    Years since quitting: 14.9   Smokeless tobacco: Never  Vaping Use   Vaping Use: Never used  Substance Use Topics   Alcohol use: Yes    Alcohol/week: 2.0 - 3.0 standard drinks    Types: 2 - 3 Glasses of wine per week    Comment: no liquor   Drug use: No     Family Hx: The patient's family history includes Breast cancer (age of onset: 38) in her sister; Breast cancer (age of  onset: 78) in her mother; Diabetes in her brother, father, mother, and sister; Heart attack in her brother, brother, and father; Parkinson's disease in her sister.  ROS:   Please see the history of present illness.    Review of Systems  Constitutional:  Negative for fever and malaise/fatigue.  HENT:  Positive for congestion. Negative for sore throat.   Respiratory:  Positive for cough and sputum production. Negative for shortness of breath.   Cardiovascular:  Negative for chest pain.  Musculoskeletal:  Negative for back pain and myalgias.  Neurological:  Positive for headaches.   All other systems reviewed and are negative.  Labs/Other Tests and Data Reviewed:    Recent Labs: 08/28/2020: ALT 21; BUN 11; Creatinine, Ser 0.68; Hemoglobin 14.2; Platelets 277; Potassium 4.5; Sodium 144   Recent Lipid Panel Lab Results  Component Value Date/Time   CHOL 160 03/28/2020 03:01 PM   TRIG 205 (H) 03/28/2020 03:01 PM   HDL 73 03/28/2020 03:01 PM   CHOLHDL 2.2 03/28/2020 03:01 PM   LDLCALC 54 03/28/2020 03:01 PM    Wt Readings from Last 3 Encounters:  01/16/21 161 lb (73 kg)  08/28/20 163 lb 12.8 oz (74.3 kg)  03/14/20 163 lb 9.6 oz (74.2 kg)     Exam:    Vital Signs:  BP (!) 134/95 (BP Location: Right Arm, Patient Position: Sitting, Cuff Size: Small)   Pulse 74   Temp (!) 96.7 F (35.9 C)   Ht 5\' 4"  (1.626 m)   Wt 161 lb (73 kg)   BMI 27.64 kg/m     Physical Exam Vitals and nursing note reviewed.  Pulmonary:     Effort: Pulmonary effort is normal.  Neurological:     Mental Status: She is alert and oriented to person, place, and time.  Psychiatric:        Mood and Affect: Affect normal.    ASSESSMENT & PLAN:    1. Sinus congestion -will go ahead a treat her with antx due to her comorbidities and age. Denies SOB or chest pain.  -Advised pt. If symptoms get worse to go the urgent care or emergency room.  - azithromycin (ZITHROMAX) 250 MG tablet; Take 2 tablets (500 mg)  on  Day 1,  followed by 1 tablet (250 mg) once daily on Days 2 through 5.  Dispense: 6 each; Refill: 0  2. Acute cough - benzonatate (TESSALON PERLES) 100 MG capsule; Take 1 capsule (100 mg total) by mouth 3 (three) times daily as needed for cough.  Dispense: 30 capsule; Refill: 0  The patient was encouraged to call or send a message through [place] for any questions or concerns.   Follow up: if symptoms persist or do not get better.   Side effects and appropriate use of all the medication(s) were discussed with the patient today. Patient advised to use the medication(s) as directed by their healthcare provider. The patient was encouraged to read, review, and understand all associated package inserts and contact our office with any questions or concerns. The patient accepts the risks of the treatment plan and had an opportunity to ask questions.   Advised patient to take Vitamin C, D, Zinc.  Keep yourself hydrated with a lot of water and rest. Take Delsym for cough and Mucinex as need. Take Tylenol or pain reliever every 4-6 hours as needed for pain/fever/body ache. If you have elevated blood pressure, you can take OTC Corcidin. You can also take OTC oscillococcinum to help with your symptoms.  Educated patient if symptoms get worse or if she experiences any SOB, chest pain or pain in her legs to seek immediate emergency care. Continue to monitor your oxygen levels. Call us if you have any questions.   "I discussed the limitations of evaluation and management by telemedicine and the availability of in person appointments. The patient expressed understanding and agreed to proceed"    Patient was given opportunity to ask questions. Patient verbalized understanding of the plan and was able to repeat key elements of the plan. All questions were answered to their satisfaction.  Raman Marzelle Rutten, DNP   I, Raman Radonna Bracher have reviewed all  documentation for this visit. The documentation on 01/16/21 for the  exam, diagnosis, procedures, and orders are all accurate and complete.   COVID-19 Education: The signs and symptoms of COVID-19 were discussed with the patient and how to seek care for testing (follow up with PCP or arrange E-visit).  The importance of social distancing was discussed today.  Patient Risk:   After full review of this patients clinical status, I feel that they are at least moderate risk at this time.  Time:   Today, I have spent 10 minutes/ seconds with the patient with telehealth technology discussing above diagnoses.     Medication Adjustments/Labs and Tests Ordered: Current medicines are reviewed at length with the patient today.  Concerns regarding medicines are outlined above.   Tests Ordered: No orders of the defined types were placed in this encounter.   Medication Changes: Meds ordered this encounter  Medications   azithromycin (ZITHROMAX) 250 MG tablet    Sig: Take 2 tablets (500 mg) on  Day 1,  followed by 1 tablet (250 mg) once daily on Days 2 through 5.    Dispense:  6 each    Refill:  0   benzonatate (TESSALON PERLES) 100 MG capsule    Sig: Take 1 capsule (100 mg total) by mouth 3 (three) times daily as needed for cough.    Dispense:  30 capsule    Refill:  0    Disposition:  Follow up prn  Signed, Bary Castilla, NP

## 2021-01-30 DIAGNOSIS — Z1152 Encounter for screening for COVID-19: Secondary | ICD-10-CM | POA: Diagnosis not present

## 2021-03-04 ENCOUNTER — Inpatient Hospital Stay: Payer: Medicare Other | Attending: Adult Health | Admitting: Adult Health

## 2021-03-04 ENCOUNTER — Encounter: Payer: Self-pay | Admitting: Adult Health

## 2021-03-04 ENCOUNTER — Other Ambulatory Visit: Payer: Self-pay

## 2021-03-04 ENCOUNTER — Inpatient Hospital Stay: Payer: Medicare Other

## 2021-03-04 VITALS — BP 161/66 | HR 77 | Temp 97.2°F | Resp 17 | Wt 169.3 lb

## 2021-03-04 DIAGNOSIS — Z7289 Other problems related to lifestyle: Secondary | ICD-10-CM | POA: Insufficient documentation

## 2021-03-04 DIAGNOSIS — Z171 Estrogen receptor negative status [ER-]: Secondary | ICD-10-CM | POA: Diagnosis not present

## 2021-03-04 DIAGNOSIS — Z853 Personal history of malignant neoplasm of breast: Secondary | ICD-10-CM | POA: Diagnosis not present

## 2021-03-04 DIAGNOSIS — T451X5A Adverse effect of antineoplastic and immunosuppressive drugs, initial encounter: Secondary | ICD-10-CM | POA: Insufficient documentation

## 2021-03-04 DIAGNOSIS — Z9884 Bariatric surgery status: Secondary | ICD-10-CM | POA: Insufficient documentation

## 2021-03-04 DIAGNOSIS — Z87891 Personal history of nicotine dependence: Secondary | ICD-10-CM | POA: Insufficient documentation

## 2021-03-04 DIAGNOSIS — I427 Cardiomyopathy due to drug and external agent: Secondary | ICD-10-CM | POA: Insufficient documentation

## 2021-03-04 DIAGNOSIS — Z9013 Acquired absence of bilateral breasts and nipples: Secondary | ICD-10-CM | POA: Diagnosis not present

## 2021-03-04 DIAGNOSIS — C50812 Malignant neoplasm of overlapping sites of left female breast: Secondary | ICD-10-CM

## 2021-03-04 DIAGNOSIS — C50111 Malignant neoplasm of central portion of right female breast: Secondary | ICD-10-CM

## 2021-03-04 DIAGNOSIS — Z803 Family history of malignant neoplasm of breast: Secondary | ICD-10-CM | POA: Insufficient documentation

## 2021-03-04 DIAGNOSIS — Z170 Estrogen receptor positive status [ER+]: Secondary | ICD-10-CM

## 2021-03-04 DIAGNOSIS — Z8249 Family history of ischemic heart disease and other diseases of the circulatory system: Secondary | ICD-10-CM | POA: Insufficient documentation

## 2021-03-04 DIAGNOSIS — Z833 Family history of diabetes mellitus: Secondary | ICD-10-CM | POA: Diagnosis not present

## 2021-03-04 DIAGNOSIS — Z880 Allergy status to penicillin: Secondary | ICD-10-CM | POA: Insufficient documentation

## 2021-03-04 DIAGNOSIS — M8580 Other specified disorders of bone density and structure, unspecified site: Secondary | ICD-10-CM | POA: Insufficient documentation

## 2021-03-04 DIAGNOSIS — Z818 Family history of other mental and behavioral disorders: Secondary | ICD-10-CM | POA: Diagnosis not present

## 2021-03-04 DIAGNOSIS — Z79899 Other long term (current) drug therapy: Secondary | ICD-10-CM | POA: Diagnosis not present

## 2021-03-04 DIAGNOSIS — E119 Type 2 diabetes mellitus without complications: Secondary | ICD-10-CM | POA: Insufficient documentation

## 2021-03-04 LAB — CBC WITH DIFFERENTIAL (CANCER CENTER ONLY)
Abs Immature Granulocytes: 0 10*3/uL (ref 0.00–0.07)
Basophils Absolute: 0 10*3/uL (ref 0.0–0.1)
Basophils Relative: 1 %
Eosinophils Absolute: 0.1 10*3/uL (ref 0.0–0.5)
Eosinophils Relative: 1 %
HCT: 42.6 % (ref 36.0–46.0)
Hemoglobin: 14.4 g/dL (ref 12.0–15.0)
Immature Granulocytes: 0 %
Lymphocytes Relative: 36 %
Lymphs Abs: 1.5 10*3/uL (ref 0.7–4.0)
MCH: 30.4 pg (ref 26.0–34.0)
MCHC: 33.8 g/dL (ref 30.0–36.0)
MCV: 90.1 fL (ref 80.0–100.0)
Monocytes Absolute: 0.5 10*3/uL (ref 0.1–1.0)
Monocytes Relative: 12 %
Neutro Abs: 2.1 10*3/uL (ref 1.7–7.7)
Neutrophils Relative %: 50 %
Platelet Count: 286 10*3/uL (ref 150–400)
RBC: 4.73 MIL/uL (ref 3.87–5.11)
RDW: 12.4 % (ref 11.5–15.5)
WBC Count: 4.2 10*3/uL (ref 4.0–10.5)
nRBC: 0 % (ref 0.0–0.2)

## 2021-03-04 LAB — CMP (CANCER CENTER ONLY)
ALT: 21 U/L (ref 0–44)
AST: 24 U/L (ref 15–41)
Albumin: 4.2 g/dL (ref 3.5–5.0)
Alkaline Phosphatase: 84 U/L (ref 38–126)
Anion gap: 7 (ref 5–15)
BUN: 12 mg/dL (ref 8–23)
CO2: 30 mmol/L (ref 22–32)
Calcium: 10.2 mg/dL (ref 8.9–10.3)
Chloride: 105 mmol/L (ref 98–111)
Creatinine: 0.62 mg/dL (ref 0.44–1.00)
GFR, Estimated: >60 mL/min (ref >60)
Glucose, Bld: 99 mg/dL (ref 70–99)
Potassium: 4.5 mmol/L (ref 3.5–5.1)
Sodium: 142 mmol/L (ref 135–145)
Total Bilirubin: 0.6 mg/dL (ref 0.3–1.2)
Total Protein: 6.8 g/dL (ref 6.5–8.1)

## 2021-03-04 NOTE — Progress Notes (Signed)
CLINIC:  Survivorship   REASON FOR VISIT:  Routine follow-up for history of breast cancer.   BRIEF ONCOLOGIC HISTORY:  BRCA negative [place] woman status post bilateral mastectomies 05/14/2012             (a) on the left, mpT1c pN0, stage IA invasive ductal carcinoma, grade 2, estrogen receptor 100% positive, progesterone receptor 11% positive, with an MIB-1 of 80%, and HER-2 amplified, the ratio being 6.50.             (b) on the right ,pT1c pN0, stage IA invasive ductal carcinoma, grade 3, triple negative, with an MIB-1 of 92%   (1) treated adjuvantly with doxorubicin and cyclophosphamide in dose dense fashion x4, completed 08/13/2012, followed by weekly Abraxane x12 given with concurrent trastuzumab, completed 12/03/2012   (2) received trastuzumab between 08/27/2012 and 06/29/2013, discontinued because of concerns regarding weakening of the heart muscle             (a) echo 07/25/2013 showed an ejection fraction of 55-60%   (3) letrozole started November 2014; completed 5 years November 2019    (4) bone density 03/01/2013 showed osteopenia with a T score of - 1.8   (5) s/p bilateral implant reconstruction with immediate expander placement (38/18/2993) with silicone implant reconstruction, 800 cc round shaped implants.   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 There were no deleterious mutations in the genetic panel tested, which included ATM, BARD1, BRCA1, BRCA2, BRIP1, CDH1, CHEK2, EPCAM, FANCC, MLH1, MSH2, MSH6, NBN, PALB2, PMS2, PTEN, RAD51C, RAD51D, STK11, TP53, and XRCC2   INTERVAL HISTORY:  Ms. Sivils presents to the [facility] today for routine follow-up for her history of breast cancer.  Overall, she reports feeling quite well. She is exercising daily, 30 minutes with walking on the treadmill.  She denies any new issues or concerns.  She is up to date with PCP visits and her diabetes is well  controlled with her most recent a1c at 6.2.    Since her last visit with [place] in January she underwent bilateral breast MRI with and without contrast in January 2022 it showed no evidence of malignancy and possible left implant rupture.  She then underwent breast MRI without contrast in February 2022 which showed no implant rupture however folds that were what was seen on the MRI with and without contrast.  She notes that in November she had a headache that was different than when she has previously experienced.  She was having sinus issues.  This headache was left frontal sinus headache.  This resolved with warm compresses and taking medicine and it has not returned since.  REVIEW OF SYSTEMS:  Review of Systems  Constitutional:  Negative for appetite change, chills, fatigue, fever and unexpected weight change.  HENT:   Negative for hearing loss, lump/mass and trouble swallowing.   Eyes:  Negative for eye problems and icterus.  Respiratory:  Negative for chest tightness, cough and shortness of breath.   Cardiovascular:  Negative for chest pain, leg swelling and palpitations.  Gastrointestinal:  Negative for abdominal distention, abdominal pain, constipation, diarrhea, nausea and vomiting.  Endocrine: Negative for hot flashes.  Genitourinary:  Negative for difficulty urinating.   Musculoskeletal:  Negative for arthralgias.  Skin:  Negative for itching and rash.  Neurological:  Negative for dizziness, extremity weakness, headaches and numbness.  Hematological:  Negative for adenopathy. Does not bruise/bleed easily.  Psychiatric/Behavioral:  Negative for depression. The patient is not  nervous/anxious.   Breast: Denies any new nodularity, masses, tenderness, nipple changes, or nipple discharge.       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 experiencing on occasional , h/o og gastroparesis    Heart murmur    Hiatal hernia    Osteopenia    Pneumonia    treated as an outpt.    Sinus problem    Sleep apnea    sleep minimal concern- " along time ago"   Past Surgical History:  Procedure Laterality Date   AXILLARY SENTINEL NODE BIOPSY Left 05/14/2012   Procedure: AXILLARY SENTINEL NODE BIOPSY;  Surgeon: Adin Hector, MD;  Location: [facility];  Service: General;  Laterality: Left;   BREAST RECONSTRUCTION WITH PLACEMENT OF TISSUE EXPANDER AND FLEX HD (ACELLULAR HYDRATED DERMIS) Bilateral 05/14/2012   Procedure: BREAST RECONSTRUCTION WITH PLACEMENT OF TISSUE EXPANDER AND FLEX HD (ACELLULAR HYDRATED DERMIS);  Surgeon: Crissie Reese, MD;  Location: [facility];  Service: Plastics;  Laterality: Bilateral;   BUNIONECTOMY Bilateral    CESAREAN SECTION  1981, 1983   FOOT SURGERY     GASTRIC BYPASS     1st surgery- at [facility], then an emergent surgery here  [facility]- for perforation of stomach & then a surgery for debridement    PORT-A-CATH REMOVAL Right 09/12/2013   Procedure: REMOVAL PORT-A-CATH;  Surgeon: Adin Hector, MD;  Location: [facility];  Service: General;  Laterality: Right;   PORTACATH PLACEMENT N/A 05/14/2012   Procedure: INSERTION PORT-A-CATH;  Surgeon: Adin Hector, MD;  Location: [facility];  Service: General;  Laterality: N/A;   SHOULDER SURGERY Left 06/2019   TONSILLECTOMY     TOTAL HIP ARTHROPLASTY Right 03/02/2017   TOTAL HIP ARTHROPLASTY Right 03/02/2017   Procedure: RIGHT TOTAL HIP ARTHROPLASTY ANTERIOR APPROACH;  Surgeon: Frederik Pear, MD;  Location: [place];  Service: Orthopedics;  Laterality: Right;   TOTAL MASTECTOMY Bilateral 05/14/2012    Procedure: TOTAL MASTECTOMY;  Surgeon: Adin Hector, MD;  Location: [place];  Service: General;  Laterality: Bilateral;   tummy tuck  1999   WOUND DEBRIDEMENT  01/14/2011   Procedure: DEBRIDEMENT ABDOMINAL WOUND;  Surgeon: Adin Hector, MD;  Location: [place];  Service: General;  Laterality: N/A;  wound debridement and debridement on the abdomen     ALLERGIES:  Allergies  Allergen Reactions   Darvon Hives and Itching   Penicillins Hives and Itching    Has patient had a PCN reaction causing immediate rash, facial/tongue/throat swelling, SOB or lightheadedness with hypotension: No Has patient had a PCN reaction causing severe rash involving mucus membranes or skin necrosis: No Has patient had a PCN reaction that required hospitalization: No Has patient had a PCN reaction occurring within the last 10 years: No If all of the above answers are "NO", then may proceed with Cephalosporin use.      CURRENT MEDICATIONS:  Outpatient Encounter Medications as of 03/04/2021  Medication Sig   Ascorbic Acid (VITAMIN C) 500 MG tablet Take  500 mg by mouth daily.   benzonatate (TESSALON PERLES) 100 MG capsule Take 1 capsule (100 mg total) by mouth 3 (three) times daily as needed for cough.   Cholecalciferol (VITAMIN D PO) Take 1 tablet by mouth daily.   CRESTOR 5 MG tablet Take 1 tablet (5 mg total) by mouth daily.   Dexlansoprazole 30 MG capsule Take 30 mg by mouth daily.   ELDERBERRY PO Take by mouth.   glucose blood (CONTOUR NEXT TEST) test strip Use as instructed TO TEST BLOOD SUGARS ONCE DAILY. DX: E11.9   KRILL OIL PO Take 1 capsule by mouth daily.    Magnesium Oxide 250 MG TABS Take 400 mg by mouth daily.    Multiple Vitamin (MULTIVITAMIN PO) Take 1 tablet by mouth daily.   Probiotic Product (PROBIOTIC PO) Take 1 capsule by mouth daily.   Semaglutide (RYBELSUS) 7 MG TABS Take 1 tablet by mouth every morning.   No facility-administered encounter medications on file as  of 03/04/2021.     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, [place]   Occupation: retired  Tobacco Use   Smoking status: Former    Types: Cigarettes    Quit date: 02/25/2006    Years since quitting: 15.0   Smokeless tobacco: Never  Vaping Use   Vaping Use: Never used  Substance and Sexual Activity   Alcohol use: Yes    Alcohol/week: 2.0 - 3.0 standard drinks    Types: 2 - 3 Glasses of wine per week    Comment: no liquor   Drug use: No   Sexual activity: Yes    Partners: Male    Birth control/protection: Post-menopausal  Other Topics Concern   Not on file  Social History Narrative   Not on file   Social Determinants of Health   Financial Resource Strain: Low Risk    Difficulty of Paying Living Expenses: Not hard at all  Food Insecurity: No Food Insecurity   Worried About Charity fundraiser in the Last Year: Never true   [place] in the Last Year: Never true  Transportation Needs: No Transportation Needs   Lack of Transportation (Medical): No   Lack of Transportation (Non-Medical): No  Physical Activity: Sufficiently Active   Days of Exercise per Week: 7 days   Minutes of Exercise per Session: 60 min  Stress: No Stress Concern Present   Feeling of Stress : Not at all  Social Connections: Not on file  Intimate Partner Violence: Not on file     PHYSICAL EXAMINATION:  Vital  Signs: Vitals:   03/04/21 1417  BP: (!) 161/66  Pulse: 77  Resp: 17  Temp: (!) 97.2 F (36.2 C)  SpO2: 100%   Filed Weights   03/04/21 1417  Weight:  169 lb 4.8 oz (76.8 kg)   General: Well-nourished, well-appearing female in no acute distress.  Accompanied by her husband. HEENT: Head is normocephalic.  Pupils equal and reactive to light. Conjunctivae clear without exudate.  Sclerae anicteric. Oral mucosa is pink, moist.  Oropharynx is pink without lesions or erythema.  Lymph: No cervical, supraclavicular, or infraclavicular lymphadenopathy noted on palpation.  Cardiovascular: Regular rate and rhythm.Marland Kitchen Respiratory: Clear to auscultation bilaterally. Chest expansion symmetric; breathing non-labored.  Breast Exam:  -Status post bilateral mastectomies with reconstruction no sign of local recurrence. -Axilla: No axillary adenopathy bilaterally.  GI: Abdomen soft and round; non-tender, non-distended. Bowel sounds normoactive. No hepatosplenomegaly.   GU: Deferred.  Neuro: No focal deficits. Steady gait.  Psych: Mood and affect normal and appropriate for situation.  MSK: No focal spinal tenderness to palpation, full range of motion in bilateral upper extremities Extremities: No edema. Skin: Warm and dry.  LABORATORY DATA:  Appointment on 03/04/2021  Component Date Value Ref Range Status   Sodium 03/04/2021 142  135 - 145 mmol/L Final   Potassium 03/04/2021 4.5  3.5 - 5.1 mmol/L Final   Chloride 03/04/2021 105  98 - 111 mmol/L Final   CO2 03/04/2021 30  22 - 32 mmol/L Final   Glucose, Bld 03/04/2021 99  70 - 99 mg/dL Final   Glucose reference range applies only to samples taken after fasting for at least 8 hours.   BUN 03/04/2021 12  8 - 23 mg/dL Final   Creatinine 03/04/2021 0.62  0.44 - 1.00 mg/dL Final   Calcium 03/04/2021 10.2  8.9 - 10.3 mg/dL Final   Total Protein 03/04/2021 6.8  6.5 - 8.1 g/dL Final   Albumin 03/04/2021 4.2  3.5 - 5.0 g/dL Final   AST 03/04/2021 24  15 - 41  U/L Final   ALT 03/04/2021 21  0 - 44 U/L Final   Alkaline Phosphatase 03/04/2021 84  38 - 126 U/L Final   Total Bilirubin 03/04/2021 0.6  0.3 - 1.2 mg/dL Final   GFR, Estimated 03/04/2021 >60  >60 mL/min Final   Comment: (NOTE) Calculated using the CKD-EPI Creatinine Equation (2021)    Anion gap 03/04/2021 7  5 - 15 Final   Performed at [facility], [place] [address] [place], [place] [address]   WBC Count 03/04/2021 4.2  4.0 - 10.5 K/uL Final   RBC 03/04/2021 4.73  3.87 - 5.11 MIL/uL Final   Hemoglobin 03/04/2021 14.4  12.0 - 15.0 g/dL Final   HCT 03/04/2021 42.6  36.0 - 46.0 % Final   MCV 03/04/2021 90.1  80.0 - 100.0 fL Final   MCH 03/04/2021 30.4  26.0 - 34.0 pg Final   MCHC 03/04/2021 33.8  30.0 - 36.0 g/dL Final   RDW 03/04/2021 12.4  11.5 - 15.5 % Final   Platelet Count 03/04/2021 286  150 - 400 K/uL Final   nRBC 03/04/2021 0.0  0.0 - 0.2 % Final   Neutrophils Relative % 03/04/2021 50  % Final   Neutro Abs 03/04/2021 2.1  1.7 - 7.7 K/uL Final   Lymphocytes Relative 03/04/2021 36  % Final   Lymphs Abs 03/04/2021 1.5  0.7 - 4.0 K/uL Final   Monocytes Relative 03/04/2021 12  % Final   Monocytes Absolute 03/04/2021 0.5  0.1 - 1.0 K/uL Final   Eosinophils Relative 03/04/2021 1  % Final   Eosinophils Absolute 03/04/2021 0.1  0.0 - 0.5 K/uL Final   Basophils Relative 03/04/2021 1  %  Final   Basophils Absolute 03/04/2021 0.0  0.0 - 0.1 K/uL Final   Immature Granulocytes 03/04/2021 0  % Final   Abs Immature Granulocytes 03/04/2021 0.00  0.00 - 0.07 K/uL Final   Performed at [facility], [place] [address], [place], [place] [address]      ASSESSMENT AND PLAN:  Ms.. Vultaggio is a pleasant woman presenting to the [facility] for surveillance and routine follow-up.   1. History of breast cancer:  Ms. Encarnacion is currently clinically and radiographically without evidence of disease or recurrence of breast cancer. She will return in  1 year for labs and follow-up for continued long-term surveillance.  I encouraged her to call me with any questions or concerns before her next visit at the cancer center, and I would be happy to see her sooner, if needed.    2.  Signs or symptoms of recurrence: We discussed reasons why she should call to return to her office.  For instance if she has new persistent headaches that is a reason to call us.  Additionally other new and persistent changes that may arise.  3. Cancer screening:  Due to Ms. Frane's history and her age, she should receive screening for skin cancers, colon cancer. She was encouraged to follow-up with her PCP for appropriate cancer screenings.   4. Health maintenance and wellness promotion: Ms. Natarajan was encouraged to consume 5-7 servings of fruits and vegetables per day. She was also encouraged to engage in moderate to vigorous exercise for 30 minutes per day most days of the week. She was instructed to limit her alcohol consumption and continue to abstain from tobacco use.    Dispo:  -Return to cancer center in 1 year for labs and continued long-term surveillance.   Total encounter time: 30 minutes in chart review, lab review, order entry, face-to-face visit time, care coordination, and documentation of the encounter.    Gardenia Phlegm, NP [facility] [place] [phone]   Note: PRIMARY CARE PROVIDER [name], [place] [phone]

## 2021-03-21 ENCOUNTER — Other Ambulatory Visit: Payer: Self-pay

## 2021-03-21 ENCOUNTER — Ambulatory Visit (INDEPENDENT_AMBULATORY_CARE_PROVIDER_SITE_OTHER): Payer: Medicare Other | Admitting: Internal Medicine

## 2021-03-21 ENCOUNTER — Encounter: Payer: Self-pay | Admitting: Internal Medicine

## 2021-03-21 ENCOUNTER — Ambulatory Visit (INDEPENDENT_AMBULATORY_CARE_PROVIDER_SITE_OTHER): Payer: Medicare Other

## 2021-03-21 VITALS — BP 132/70 | HR 92 | Temp 97.8°F | Ht 64.8 in | Wt 166.7 lb

## 2021-03-21 VITALS — BP 132/70 | HR 92 | Temp 97.8°F | Ht 64.8 in | Wt 166.6 lb

## 2021-03-21 DIAGNOSIS — E1169 Type 2 diabetes mellitus with other specified complication: Secondary | ICD-10-CM

## 2021-03-21 DIAGNOSIS — Z23 Encounter for immunization: Secondary | ICD-10-CM | POA: Diagnosis not present

## 2021-03-21 DIAGNOSIS — E785 Hyperlipidemia, unspecified: Secondary | ICD-10-CM | POA: Diagnosis not present

## 2021-03-21 DIAGNOSIS — Z6827 Body mass index (BMI) 27.0-27.9, adult: Secondary | ICD-10-CM

## 2021-03-21 DIAGNOSIS — Z0000 Encounter for general adult medical examination without abnormal findings: Secondary | ICD-10-CM

## 2021-03-21 MED ORDER — CRESTOR 5 MG PO TABS: 5.0000 mg | ORAL_TABLET | Freq: Every day | ORAL | 2 refills | Status: DC

## 2021-03-21 MED ORDER — RYBELSUS 7 MG PO TABS: 1.0000 | ORAL_TABLET | Freq: Every morning | ORAL | 2 refills | Status: DC

## 2021-03-21 MED ORDER — BOOSTRIX 5-2.5-18.5 LF-MCG/0.5 IM SUSP: 0.5000 mL | Freq: Once | INTRAMUSCULAR | 0 refills | Status: AC

## 2021-03-21 MED ORDER — TETANUS-DIPHTH-ACELL PERTUSSIS 5-2.5-18.5 LF-MCG/0.5 IM SUSY
0.5000 mL | PREFILLED_SYRINGE | Freq: Once | INTRAMUSCULAR | Status: AC
  Administered 2021-03-21: 0.5 mL via INTRAMUSCULAR

## 2021-03-21 MED ORDER — CONTOUR NEXT TEST VI STRP: ORAL_STRIP | 3 refills | Status: DC

## 2021-03-21 NOTE — Patient Instructions (Signed)
[name] [name] , Thank you for taking time to come for your Medicare Wellness Visit. I appreciate your ongoing commitment to your health goals. Please review the following plan we discussed and let me know if I can assist you in the future.   Screening recommendations/referrals: Colonoscopy: completed 04/28/2012, due 04/29/2022 Mammogram: not required Bone Density: completed 03/01/2013 Recommended yearly ophthalmology/optometry visit for glaucoma screening and checkup Recommended yearly dental visit for hygiene and checkup  Vaccinations: Influenza vaccine: completed 11/19/2020, due next flu season Pneumococcal vaccine: completed 10/06/2018 Tdap vaccine: sent to pharmacy Shingles vaccine: completed   Covid-19: 11/14/2020, 05/24/2020, 10/08/2019, 03/31/2019, 03/17/2019  Advanced directives: Please bring a copy of your POA (Power of Attorney) and/or Living Will to your next appointment.   Conditions/risks identified: none  Next appointment: Follow up in one year for your annual wellness visit    Preventive Care 65 Years and Older, Female Preventive care refers to lifestyle choices and visits with your health care provider that can promote health and wellness. What does preventive care include? A yearly physical exam. This is also called an annual well check. Dental exams once or twice a year. Routine eye exams. Ask your health care provider how often you should have your eyes checked. Personal lifestyle choices, including: Daily care of your teeth and gums. Regular physical activity. Eating a healthy diet. Avoiding tobacco and drug use. Limiting alcohol use. Practicing safe sex. Taking low-dose aspirin every day. Taking vitamin and mineral supplements as recommended by your health care provider. What happens during an annual well check? The services and screenings done by your health care provider during your annual well check will depend on your age, overall health, lifestyle risk factors,  and family history of disease. Counseling  Your health care provider may ask you questions about your: Alcohol use. Tobacco use. Drug use. Emotional well-being. Home and relationship well-being. Sexual activity. Eating habits. History of falls. Memory and ability to understand (cognition). Work and work Statistician. Reproductive health. Screening  You may have the following tests or measurements: Height, weight, and BMI. Blood pressure. Lipid and cholesterol levels. These may be checked every 5 years, or more frequently if you are over 58 years old. Skin check. Lung cancer screening. You may have this screening every year starting at age 32 if you have a 30-pack-year history of smoking and currently smoke or have quit within the past 15 years. Fecal occult blood test (FOBT) of the stool. You may have this test every year starting at age 19. Flexible sigmoidoscopy or colonoscopy. You may have a sigmoidoscopy every 5 years or a colonoscopy every 10 years starting at age 38. Hepatitis C blood test. Hepatitis B blood test. Sexually transmitted disease (STD) testing. Diabetes screening. This is done by checking your blood sugar (glucose) after you have not eaten for a while (fasting). You may have this done every 1-3 years. Bone density scan. This is done to screen for osteoporosis. You may have this done starting at age 52. Mammogram. This may be done every 1-2 years. Talk to your health care provider about how often you should have regular mammograms. Talk with your health care provider about your test results, treatment options, and if necessary, the need for more tests. Vaccines  Your health care provider may recommend certain vaccines, such as: Influenza vaccine. This is recommended every year. Tetanus, diphtheria, and acellular pertussis (Tdap, Td) vaccine. You may need a Td booster every 10 years. Zoster vaccine. You may need this after age 74.  Pneumococcal 13-valent conjugate  (PCV13) vaccine. One dose is recommended after age 66. Pneumococcal polysaccharide (PPSV23) vaccine. One dose is recommended after age 29. Talk to your health care provider about which screenings and vaccines you need and how often you need them. This information is not intended to replace advice given to you by your health care provider. Make sure you discuss any questions you have with your health care provider. Document Released: 03/02/2015 Document Revised: 10/24/2015 Document Reviewed: 12/05/2014 Elsevier Interactive Patient Education  2017 [place] Prevention in the Home Falls can cause injuries. They can happen to people of all ages. There are many things you can do to make your home safe and to help prevent falls. What can I do on the outside of my home? Regularly fix the edges of walkways and driveways and fix any cracks. Remove anything that might make you trip as you walk through a door, such as a raised step or threshold. Trim any bushes or trees on the path to your home. Use bright outdoor lighting. Clear any walking paths of anything that might make someone trip, such as rocks or tools. Regularly check to see if handrails are loose or broken. Make sure that both sides of any steps have handrails. Any raised decks and porches should have guardrails on the edges. Have any leaves, snow, or ice cleared regularly. Use sand or salt on walking paths during winter. Clean up any spills in your garage right away. This includes oil or grease spills. What can I do in the bathroom? Use night lights. Install grab bars by the toilet and in the tub and shower. Do not use towel bars as grab bars. Use non-skid mats or decals in the tub or shower. If you need to sit down in the shower, use a plastic, non-slip stool. Keep the floor dry. Clean up any water that spills on the floor as soon as it happens. Remove soap buildup in the tub or shower regularly. Attach bath mats securely with  double-sided non-slip rug tape. Do not have throw rugs and other things on the floor that can make you trip. What can I do in the bedroom? Use night lights. Make sure that you have a light by your bed that is easy to reach. Do not use any sheets or blankets that are too big for your bed. They should not hang down onto the floor. Have a firm chair that has side arms. You can use this for support while you get dressed. Do not have throw rugs and other things on the floor that can make you trip. What can I do in the kitchen? Clean up any spills right away. Avoid walking on wet floors. Keep items that you use a lot in easy-to-reach places. If you need to reach something above you, use a strong step stool that has a grab bar. Keep electrical cords out of the way. Do not use floor polish or wax that makes floors slippery. If you must use wax, use non-skid floor wax. Do not have throw rugs and other things on the floor that can make you trip. What can I do with my stairs? Do not leave any items on the stairs. Make sure that there are handrails on both sides of the stairs and use them. Fix handrails that are broken or loose. Make sure that handrails are as long as the stairways. Check any carpeting to make sure that it is firmly attached to the stairs. Fix any  carpet that is loose or worn. Avoid having throw rugs at the top or bottom of the stairs. If you do have throw rugs, attach them to the floor with carpet tape. Make sure that you have a light switch at the top of the stairs and the bottom of the stairs. If you do not have them, ask someone to add them for you. What else can I do to help prevent falls? Wear shoes that: Do not have high heels. Have rubber bottoms. Are comfortable and fit you well. Are closed at the toe. Do not wear sandals. If you use a stepladder: Make sure that it is fully opened. Do not climb a closed stepladder. Make sure that both sides of the stepladder are locked  into place. Ask someone to hold it for you, if possible. Clearly mark and make sure that you can see: Any grab bars or handrails. First and last steps. Where the edge of each step is. Use tools that help you move around (mobility aids) if they are needed. These include: Canes. Walkers. Scooters. Crutches. Turn on the lights when you go into a dark area. Replace any light bulbs as soon as they burn out. Set up your furniture so you have a clear path. Avoid moving your furniture around. If any of your floors are uneven, fix them. If there are any pets around you, be aware of where they are. Review your medicines with your doctor. Some medicines can make you feel dizzy. This can increase your chance of falling. Ask your doctor what other things that you can do to help prevent falls. This information is not intended to replace advice given to you by your health care provider. Make sure you discuss any questions you have with your health care provider. Document Released: 11/30/2008 Document Revised: 07/12/2015 Document Reviewed: 03/10/2014 Elsevier Interactive Patient Education  2017 [employer].

## 2021-03-21 NOTE — Progress Notes (Signed)
[name] [name],acting as a Education administrator for [name] Greenland, MD.,have documented all relevant documentation on the behalf of [name] Greenland, MD,as directed by  [name] Greenland, MD while in the presence of [name] Greenland, MD.  This visit occurred during the SARS-CoV-2 public health emergency.  Safety protocols were in place, including screening questions prior to the visit, additional usage of staff PPE, and extensive cleaning of exam room while observing appropriate contact time as indicated for disinfecting solutions.  Subjective:     Patient ID: [name] [name] , female    DOB: [birth year] , 71 y.o.   MRN: [id number]   Chief Complaint  Patient presents with   Diabetes    HPI   She presents today for diabetes check and cholesterol f/u. She reports compliance with meds. She denies headaches, chest pain and shortness of breath. She is currently exercising at least five days per week.   Diabetes She presents for her follow-up diabetic visit. She has type 2 diabetes mellitus. There are no hypoglycemic associated symptoms. There are no diabetic associated symptoms. Pertinent negatives for diabetes include no polydipsia, no polyphagia and no polyuria. There are no diabetic complications. Risk factors for coronary artery disease include diabetes mellitus, dyslipidemia, hypertension and post-menopausal. She participates in exercise daily. An ACE inhibitor/angiotensin II receptor blocker is not being taken.  Hyperlipidemia This is a chronic problem. The current episode started more than 1 year ago. The problem is controlled. Exacerbating diseases include diabetes. Pertinent negatives include no myalgias. Current antihyperlipidemic treatment includes statins. The current treatment provides moderate improvement of lipids. There are no compliance problems.  Risk factors for coronary artery disease include post-menopausal, diabetes mellitus and dyslipidemia.    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 experiencing on occasional , h/o og gastroparesis    Heart murmur    Hiatal hernia    Osteopenia    Pneumonia    treated as an outpt.    Sinus problem    Sleep apnea    sleep minimal concern- " along time ago"     Family History  Problem Relation Age of Onset   Heart attack Father    Diabetes Father    Heart attack Brother    Diabetes Brother    Diabetes Sister    Breast cancer Sister 62   Diabetes Mother    Breast cancer Mother 57   Parkinson's disease Sister    Heart attack Brother      Current Outpatient Medications:    Ascorbic Acid (VITAMIN C) 500 MG tablet, Take 500 mg by mouth daily., Disp: , Rfl:    Cholecalciferol (VITAMIN D PO), Take 1 tablet by mouth daily. (Patient not taking: Reported on 03/21/2021), Disp: , Rfl:    CRESTOR 5 MG tablet, Take 1 tablet (5 mg total) by mouth daily., Disp: 90 tablet, Rfl: 2   ELDERBERRY PO, Take by mouth., Disp: , Rfl:    glucose blood (CONTOUR NEXT TEST) test strip, Use as instructed TO TEST BLOOD SUGARS ONCE DAILY. DX:  E11.9, Disp: 100 each, Rfl: 3   KRILL OIL PO, Take 1 capsule by mouth daily. , Disp: , Rfl:    Magnesium Oxide 250 MG TABS, Take 400 mg by mouth daily. , Disp: , Rfl:    Multiple Vitamin (MULTIVITAMIN PO), Take 1 tablet by mouth daily., Disp: , Rfl:    Probiotic Product (PROBIOTIC PO), Take 1 capsule by mouth daily., Disp: , Rfl:    Semaglutide (RYBELSUS) 7 MG TABS, Take 1 tablet by mouth every morning., Disp: 90 tablet, Rfl: 2   Tdap (BOOSTRIX) 5-2.5-18.5 LF-MCG/0.5 injection, Inject 0.5 mLs into the muscle once for 1 dose., Disp: 0.5  mL, Rfl: 0   Allergies  Allergen Reactions   Darvon Hives and Itching   Penicillins Hives and Itching    Has patient had a PCN reaction causing immediate rash, facial/tongue/throat swelling, SOB or lightheadedness with hypotension: No Has patient had a PCN reaction causing severe rash involving mucus membranes or skin necrosis: No Has patient had a PCN reaction that required hospitalization: No Has patient had a PCN reaction occurring within the last 10 years: No If all of the above answers are "NO", then may proceed with Cephalosporin use.      Review of Systems  Constitutional: Negative.   Respiratory: Negative.    Cardiovascular: Negative.   Endocrine: Negative for polydipsia, polyphagia and polyuria.  Musculoskeletal:  Negative for myalgias.  Neurological: Negative.   Psychiatric/Behavioral: Negative.      Today's Vitals   03/21/21 1032  BP: 132/70  Pulse: 92  Temp: 97.8 F (36.6 C)  Weight: 166 lb 10.7 oz (75.6 kg)  Height: 5' 4.8" (1.646 m)   Body mass index is 27.91 kg/m.  Wt Readings from Last 3 Encounters:  03/21/21 166 lb 10.7 oz (75.6 kg)  03/21/21 166 lb 9.6 oz (75.6 kg)  03/04/21 169 lb 4.8 oz (76.8 kg)    Objective:  Physical Exam Vitals and nursing note reviewed.  Constitutional:      Appearance: Normal appearance.  HENT:     Head: Normocephalic and atraumatic.     Nose:     Comments: Masked     Mouth/Throat:     Comments: Masked  Eyes:     Extraocular Movements: Extraocular movements intact.  Cardiovascular:     Rate and Rhythm: Normal rate and regular rhythm.     Heart sounds: Normal heart sounds.  Pulmonary:     Effort: Pulmonary effort is normal.     Breath sounds: Normal breath sounds.  Musculoskeletal:     Cervical back: Normal range of motion.  Skin:    General: Skin is warm.  Neurological:     General: No focal deficit present.     Mental Status: She is alert.  Psychiatric:        Mood and Affect: Mood normal.        Behavior:  Behavior normal.        Assessment And Plan:     1. Type 2 diabetes mellitus with hyperlipidemia ([place]) Comments: Chronic, she will continue with Rybelsus 7mg  daily. Rx refill sent to the pharmacy. Will decrease dose once she reaches goal weight. She will c/w Brand name Crestor daily. She was congratulated on her current lifestyle changes.  - Hemoglobin A1c - Lipid panel  2. BMI 27.0-27.9,adult Comments: Her BMI is acceptable for her demographic. She is encouraged to continue with her regular exercise regimen.   3. Immunization due - Tdap (BOOSTRIX) injection 0.5 mL   Patient  was given opportunity to ask questions. Patient verbalized understanding of the plan and was able to repeat key elements of the plan. All questions were answered to their satisfaction.   I, [name] Greenland, MD, have reviewed all documentation for this visit. The documentation on 03/21/21 for the exam, diagnosis, procedures, and orders are all accurate and complete.   IF YOU HAVE BEEN REFERRED TO A SPECIALIST, IT MAY TAKE 1-2 WEEKS TO SCHEDULE/PROCESS THE REFERRAL. IF YOU HAVE NOT HEARD FROM US/SPECIALIST IN TWO WEEKS, PLEASE GIVE [place] A CALL AT [phone].   THE PATIENT IS ENCOURAGED TO PRACTICE SOCIAL DISTANCING DUE TO THE COVID-19 PANDEMIC.

## 2021-03-21 NOTE — Progress Notes (Signed)
This visit occurred during the SARS-CoV-2 public health emergency.  Safety protocols were in place, including screening questions prior to the visit, additional usage of staff PPE, and extensive cleaning of exam room while observing appropriate contact time as indicated for disinfecting solutions.  Subjective:   [name] [name] is a 71 y.o. female who presents for Medicare Annual (Subsequent) preventive examination.  Review of Systems     Cardiac Risk Factors include: advanced age (>51men, >81 women);diabetes mellitus;dyslipidemia     Objective:    Today's Vitals   03/21/21 1003 03/21/21 1024  BP: 136/70 132/70  Pulse: 92   Temp: 97.8 F (36.6 C)   TempSrc: Oral   SpO2: 95%   Weight: 166 lb 9.6 oz (75.6 kg)   Height: 5' 4.8" (1.646 m)    Body mass index is 27.9 kg/m.  Advanced Directives 03/21/2021 03/14/2020 03/09/2019 03/03/2018 03/02/2017 02/25/2017 05/15/2016  Does Patient Have a Medical Advance Directive? Yes Yes Yes Yes Yes Yes Yes  Type of Paramedic of [place];Living will [place];Living will [place];Living will [place];Living will Living will;Healthcare Power of Attorney - Living will;Healthcare Power of Attorney  Does patient want to make changes to medical advance directive? - - - No - Patient declined No - Patient declined No - Patient declined -  Copy of [place] in Chart? No - copy requested No - copy requested No - copy requested No - copy requested No - copy requested - -  Pre-existing out of facility DNR order (yellow form or pink MOST form) - - - - - - -    Current Medications (verified) Outpatient Encounter Medications as of 03/21/2021  Medication Sig   Ascorbic Acid (VITAMIN C) 500 MG tablet Take 500 mg by mouth daily.   CRESTOR 5 MG tablet Take 1 tablet (5 mg total) by mouth daily.   ELDERBERRY PO Take by mouth.   glucose blood (CONTOUR NEXT TEST) test  strip Use as instructed TO TEST BLOOD SUGARS ONCE DAILY. DX: E11.9   KRILL OIL PO Take 1 capsule by mouth daily.    Magnesium Oxide 250 MG TABS Take 400 mg by mouth daily.    Multiple Vitamin (MULTIVITAMIN PO) Take 1 tablet by mouth daily.   Probiotic Product (PROBIOTIC PO) Take 1 capsule by mouth daily.   Semaglutide (RYBELSUS) 7 MG TABS Take 1 tablet by mouth every morning.   Tdap (BOOSTRIX) 5-2.5-18.5 LF-MCG/0.5 injection Inject 0.5 mLs into the muscle once for 1 dose.   Cholecalciferol (VITAMIN D PO) Take 1 tablet by mouth daily. (Patient not taking: Reported on 03/21/2021)   No facility-administered encounter medications on file as of 03/21/2021.    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 experiencing on occasional , h/o og gastroparesis    Heart murmur    Hiatal hernia    Osteopenia    Pneumonia    treated as an outpt.    Sinus problem  Sleep apnea    sleep minimal concern- " along time ago"   Past Surgical History:  Procedure Laterality Date   AXILLARY SENTINEL NODE BIOPSY Left 05/14/2012   Procedure: AXILLARY SENTINEL NODE BIOPSY;  Surgeon: Adin Hector, MD;  Location: [facility];  Service: General;  Laterality: Left;   BREAST RECONSTRUCTION WITH PLACEMENT OF TISSUE EXPANDER AND FLEX HD (ACELLULAR HYDRATED DERMIS) Bilateral 05/14/2012   Procedure: BREAST RECONSTRUCTION WITH PLACEMENT OF TISSUE EXPANDER AND FLEX HD (ACELLULAR HYDRATED DERMIS);  Surgeon: Crissie Reese, MD;  Location: [facility];  Service: Plastics;  Laterality: Bilateral;    BUNIONECTOMY Bilateral    CESAREAN SECTION  1981, 1983   FOOT SURGERY     GASTRIC BYPASS     1st surgery- at [facility], then an emergent surgery [facility]  [facility]- for perforation of stomach & then a surgery for debridement    PORT-A-CATH REMOVAL Right 09/12/2013   Procedure: REMOVAL PORT-A-CATH;  Surgeon: Adin Hector, MD;  Location: [facility];  Service: General;  Laterality: Right;   PORTACATH PLACEMENT N/A 05/14/2012   Procedure: INSERTION PORT-A-CATH;  Surgeon: Adin Hector, MD;  Location: [facility];  Service: General;  Laterality: N/A;   SHOULDER SURGERY Left 06/2019   TONSILLECTOMY     TOTAL HIP ARTHROPLASTY Right 03/02/2017   TOTAL HIP ARTHROPLASTY Right 03/02/2017   Procedure: RIGHT TOTAL HIP ARTHROPLASTY ANTERIOR APPROACH;  Surgeon: Frederik Pear, MD;  Location: [place];  Service: Orthopedics;  Laterality: Right;   TOTAL MASTECTOMY Bilateral 05/14/2012   Procedure: TOTAL MASTECTOMY;  Surgeon: Adin Hector, MD;  Location: [facility];  Service: General;  Laterality: Bilateral;   tummy tuck  1999   WOUND DEBRIDEMENT  01/14/2011   Procedure: DEBRIDEMENT ABDOMINAL WOUND;  Surgeon: Adin Hector, MD;  Location: [place];  Service: General;  Laterality: N/A;  wound debridement and debridement on the abdomen   Family History  Problem Relation Age of Onset   Heart attack Father    Diabetes Father    Heart attack Brother    Diabetes Brother    Diabetes Sister    Breast cancer Sister 11   Diabetes Mother    Breast cancer Mother 45   Parkinson's disease Sister    Heart attack Brother    Social History   Socioeconomic History   Marital status: Married    Spouse name: Joe   Number of children: 2   Years of education: 18   Highest education level: Not on file  Occupational History   Occupation: Therapist, 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, [place]   Occupation: retired  Tobacco Use   Smoking status: Former    Types: Cigarettes    Quit date: 02/25/2006     Years since quitting: 15.0   Smokeless tobacco: Never  Vaping Use   Vaping Use: Never used  Substance and Sexual Activity   Alcohol use: Yes    Alcohol/week: 2.0 - 3.0 standard drinks    Types: 2 - 3 Glasses of wine per week    Comment: no liquor   Drug use: No   Sexual activity: Yes    Partners: Male    Birth control/protection: Post-menopausal  Other Topics Concern   Not on file  Social History Narrative   Not on file   Social Determinants of Health   Financial Resource Strain: Low Risk    Difficulty of Paying Living Expenses: Not hard at all  Food Insecurity: No Food Insecurity  Worried About Charity fundraiser in the Last Year: Never true   [place] in the Last Year: Never true  Transportation Needs: No Transportation Needs   Lack of Transportation (Medical): No   Lack of Transportation (Non-Medical): No  Physical Activity: Sufficiently Active   Days of Exercise per Week: 7 days   Minutes of Exercise per Session: 40 min  Stress: No Stress Concern Present   Feeling of Stress : Not at all  Social Connections: Not on file    Tobacco Counseling Counseling given: Not Answered   Clinical Intake:  Pre-visit preparation completed: Yes  Pain : No/denies pain     Nutritional Status: BMI 25 -29 Overweight Nutritional Risks: None Diabetes: Yes  How often do you need to have someone help you when you read instructions, pamphlets, or other written materials from your doctor or pharmacy?: 1 - Never What is the last grade level you completed in school?: masters degree  Diabetic? Yes Nutrition Risk Assessment:  Has the patient had any N/V/D within the last 2 months?  No  Does the patient have any non-healing wounds?  No  Has the patient had any unintentional weight loss or weight gain?  No   Diabetes:  Is the patient diabetic?  Yes  If diabetic, was a CBG obtained today?  No  Did the patient bring in their glucometer from home?  No  How often do you  monitor your CBG's? Twice weekly.   Financial Strains and Diabetes Management:  Are you having any financial strains with the device, your supplies or your medication? No .  Does the patient want to be seen by Chronic Care Management for management of their diabetes?  No  Would the patient like to be referred to a Nutritionist or for Diabetic Management?  No   Diabetic Exams:  Diabetic Eye Exam: Overdue for diabetic eye exam. Pt has been advised about the importance in completing this exam. Patient advised to call and schedule an eye exam. Diabetic Foot Exam: Completed 08/28/2020   Interpreter Needed?: No  Information entered by :: NAllen LPN   Activities of Daily Living In your present state of health, do you have any difficulty performing the following activities: 03/21/2021  Hearing? N  Vision? N  Difficulty concentrating or making decisions? N  Walking or climbing stairs? N  Dressing or bathing? N  Doing errands, shopping? N  Preparing Food and eating ? N  Using the Toilet? N  In the past six months, have you accidently leaked urine? N  Do you have problems with loss of bowel control? N  Managing your Medications? N  Managing your Finances? N  Housekeeping or managing your Housekeeping? N  Some recent data might be hidden    Patient Care Team: [name], MD as PCP - General (Internal Medicine) Crawford Givens, MD as Consulting Physician (Obstetrics and Gynecology) Fanny Skates, MD as Consulting Physician (General Surgery) Crissie Reese, MD as Consulting Physician (Plastic Surgery) Belva Crome, MD as Consulting Physician (Cardiology) Marylynn Pearson, MD as Consulting Physician (Ophthalmology)  Indicate any recent Medical Services you may have received from other than [facility] providers in the past year (date may be approximate).     Assessment:   This is a routine wellness examination for [name].  Hearing/Vision screen Vision Screening - Comments:: Regular eye  exams, Dr. Venetia Maxon  Dietary issues and exercise activities discussed: Current Exercise Habits: Home exercise routine, Type of exercise: treadmill (elliptical), Time (Minutes): 45,  Frequency (Times/Week): 7, Weekly Exercise (Minutes/Week): 315   Goals Addressed             This Visit's Progress    Patient Stated       03/21/2021, lose a little weight and get off rybelsus, stay healthy       Depression Screen PHQ 2/9 Scores 03/21/2021 03/14/2020 03/14/2020 03/09/2019 03/03/2018  PHQ - 2 Score 0 0 0 0 0  PHQ- 9 Score - - - 0 0    Fall Risk Fall Risk  03/21/2021 03/14/2020 03/14/2020 03/09/2019 03/03/2018  Falls in the past year? 0 0 0 0 0  Risk for fall due to : Medication side effect Medication side effect - Medication side effect Medication side effect  Follow up Falls evaluation completed;Education provided;Falls prevention discussed Falls evaluation completed;Education provided;Falls prevention discussed - Falls evaluation completed;Education provided;Falls prevention discussed Education provided;Falls prevention discussed    FALL RISK PREVENTION PERTAINING TO THE HOME:  Any stairs in or around the home? Yes  If so, are there any without handrails? No  Home free of loose throw rugs in walkways, pet beds, electrical cords, etc? Yes  Adequate lighting in your home to reduce risk of falls? Yes   ASSISTIVE DEVICES UTILIZED TO PREVENT FALLS:  Life alert? No  Use of a cane, walker or w/c? No  Grab bars in the bathroom? Yes  Shower chair or bench in shower? Yes  Elevated toilet seat or a handicapped toilet? Yes   TIMED UP AND GO:  Was the test performed? No .    Gait steady and fast without use of assistive device  Cognitive Function:     6CIT Screen 03/21/2021 03/14/2020 03/09/2019 03/03/2018  What Year? 0 points 0 points 0 points 0 points  What month? 0 points 0 points 0 points 0 points  What time? 0 points 0 points 0 points 0 points  Count back from 20 0 points 0 points 0 points  0 points  Months in reverse 0 points 0 points 0 points 0 points  Repeat phrase 0 points 2 points 0 points 0 points  Total Score 0 2 0 0    Immunizations Immunization History  Administered Date(s) Administered   Fluad Quad(high Dose 65+) 11/18/2019, 11/19/2020   Influenza, High Dose Seasonal PF 10/06/2018   Influenza,inj,Quad PF,6+ Mos 11/05/2012   Influenza-Unspecified 12/18/2017, 10/06/2018   PFIZER(Purple Top)SARS-COV-2 Vaccination 03/17/2019, 03/31/2019, 10/08/2019, 05/24/2020   Pfizer Covid-19 Vaccine Bivalent Booster 28yrs & up 11/14/2020   Pneumococcal Conjugate-13 10/06/2018   Pneumococcal Polysaccharide-23 03/03/2017   Zoster Recombinat (Shingrix) 03/24/2017, 07/01/2017    TDAP status: Due, Education has been provided regarding the importance of this vaccine. Advised may receive this vaccine at local pharmacy or Health Dept. Aware to provide a copy of the vaccination record if obtained from local pharmacy or Health Dept. Verbalized acceptance and understanding.  Flu Vaccine status: Up to date  Pneumococcal vaccine status: Up to date  Covid-19 vaccine status: Completed vaccines  Qualifies for Shingles Vaccine? Yes   Zostavax completed No   Shingrix Completed?: Yes  Screening Tests Health Maintenance  Topic Date Due   OPHTHALMOLOGY EXAM  08/03/2020   TETANUS/TDAP  02/03/2021   HEMOGLOBIN A1C  02/28/2021   FOOT EXAM  08/28/2021   URINE MICROALBUMIN  08/28/2021   COLONOSCOPY (Pts 45-33yrs Insurance coverage will need to be confirmed)  04/29/2022   Pneumonia Vaccine 68+ Years old  Completed   INFLUENZA VACCINE  Completed   DEXA SCAN  Completed  COVID-19 Vaccine  Completed   Hepatitis C Screening  Completed   Zoster Vaccines- Shingrix  Completed   HPV VACCINES  Aged Out    Health Maintenance  Health Maintenance Due  Topic Date Due   OPHTHALMOLOGY EXAM  08/03/2020   TETANUS/TDAP  02/03/2021   HEMOGLOBIN A1C  02/28/2021    Colorectal cancer screening: Type  of screening: Colonoscopy. Completed 04/28/2012. Repeat every 10 years  Mammogram status: No longer required due to double mastectomy.  Bone Density status: Completed 03/01/2013.   Lung Cancer Screening: (Low Dose CT Chest recommended if Age 32-80 years, 30 pack-year currently smoking OR have quit w/in 15years.) does not qualify.   Lung Cancer Screening Referral: no  Additional Screening:  Hepatitis C Screening: does qualify; Completed 03/14/2020  Vision Screening: Recommended annual ophthalmology exams for early detection of glaucoma and other disorders of the eye. Is the patient up to date with their annual eye exam?  No  Who is the provider or what is the name of the office in which the patient attends annual eye exams? Dr. Venetia Maxon If pt is not established with a provider, would they like to be referred to a provider to establish care? No .   Dental Screening: Recommended annual dental exams for proper oral hygiene  Community Resource Referral / Chronic Care Management: CRR required this visit?  No   CCM required this visit?  No      Plan:     I have personally reviewed and noted the following in the patients chart:   Medical and social history Use of alcohol, tobacco or illicit drugs  Current medications and supplements including opioid prescriptions.  Functional ability and status Nutritional status Physical activity Advanced directives List of other physicians Hospitalizations, surgeries, and ER visits in previous 12 months Vitals Screenings to include cognitive, depression, and falls Referrals and appointments  In addition, I have reviewed and discussed with patient certain preventive protocols, quality metrics, and best practice recommendations. A written personalized care plan for preventive services as well as general preventive health recommendations were provided to patient.     Kellie Simmering, LPN   02/17/7354   Nurse Notes: none

## 2021-03-21 NOTE — Patient Instructions (Signed)

## 2021-03-22 LAB — LIPID PANEL
Chol/HDL Ratio: 2.2 ratio (ref 0.0–4.4)
Cholesterol, Total: 175 mg/dL (ref 100–199)
HDL: 80 mg/dL (ref >39)
LDL Chol Calc (NIH): 83 mg/dL (ref 0–99)
Triglycerides: 63 mg/dL (ref 0–149)
VLDL Cholesterol Cal: 12 mg/dL (ref 5–40)

## 2021-03-22 LAB — HEMOGLOBIN A1C
Est. average glucose Bld gHb Est-mCnc: 154 mg/dL
Hgb A1c MFr Bld: 7 % — ABNORMAL HIGH (ref 4.8–5.6)

## 2021-04-24 DIAGNOSIS — U071 COVID-19: Secondary | ICD-10-CM | POA: Diagnosis not present

## 2021-07-29 ENCOUNTER — Ambulatory Visit: Payer: Medicare Other | Admitting: Internal Medicine

## 2021-10-02 ENCOUNTER — Ambulatory Visit: Payer: Medicare Other | Admitting: Internal Medicine

## 2021-10-08 ENCOUNTER — Ambulatory Visit (INDEPENDENT_AMBULATORY_CARE_PROVIDER_SITE_OTHER): Payer: Medicare Other | Admitting: Internal Medicine

## 2021-10-08 ENCOUNTER — Encounter: Payer: Self-pay | Admitting: Internal Medicine

## 2021-10-08 VITALS — BP 124/80 | Temp 98.1°F | Ht 64.0 in | Wt 166.2 lb

## 2021-10-08 DIAGNOSIS — Z6828 Body mass index (BMI) 28.0-28.9, adult: Secondary | ICD-10-CM | POA: Diagnosis not present

## 2021-10-08 DIAGNOSIS — E1169 Type 2 diabetes mellitus with other specified complication: Secondary | ICD-10-CM | POA: Diagnosis not present

## 2021-10-08 DIAGNOSIS — E785 Hyperlipidemia, unspecified: Secondary | ICD-10-CM

## 2021-10-08 LAB — POCT URINALYSIS DIPSTICK
Bilirubin, UA: NEGATIVE
Blood, UA: NEGATIVE
Glucose, UA: NEGATIVE
Ketones, UA: NEGATIVE
Leukocytes, UA: NEGATIVE
Nitrite, UA: NEGATIVE
Protein, UA: NEGATIVE
Spec Grav, UA: 1.015 (ref 1.010–1.025)
Urobilinogen, UA: 0.2 E.U./dL
pH, UA: 7 (ref 5.0–8.0)

## 2021-10-08 MED ORDER — RYBELSUS 7 MG PO TABS: 1.0000 | ORAL_TABLET | Freq: Every morning | ORAL | 2 refills | Status: DC

## 2021-10-08 NOTE — Progress Notes (Signed)
I,[name] [name],acting as a scribe for [name] Greenland, MD.,have documented all relevant documentation on the behalf of [name] Greenland, MD,as directed by  [name] Greenland, MD while in the presence of [name] Greenland, MD.    Subjective:     Patient ID: [name] [name] , female    DOB: 1950/05/05 , 71 y.o.   MRN: [id number]   Chief Complaint  Patient presents with   Diabetes    HPI   She presents today for diabetes check and cholesterol f/u. She reports compliance with meds. She states the Rybelsus approval expired June 2023. She will soon run out of her current supply.  Additionally, her Crestor approval expires in September 2023.  She denies headaches, chest pain and shortness of breath. She is currently exercising at least five days per week.    Diabetes She presents for her follow-up diabetic visit. She has type 2 diabetes mellitus. There are no hypoglycemic associated symptoms. There are no diabetic associated symptoms. Pertinent negatives for diabetes include no polydipsia, no polyphagia and no polyuria. There are no diabetic complications. Risk factors for coronary artery disease include diabetes mellitus, dyslipidemia, hypertension and post-menopausal. She participates in exercise daily. An ACE inhibitor/angiotensin II receptor blocker is not being taken.  Hyperlipidemia This is a chronic problem. The current episode started more than 1 year ago. The problem is controlled. Exacerbating diseases include diabetes. Pertinent negatives include no myalgias. Current antihyperlipidemic treatment includes statins. The current treatment provides moderate improvement of lipids. There are no compliance problems.  Risk factors for coronary artery disease include post-menopausal, diabetes mellitus and dyslipidemia.     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 experiencing on occasional , h/o og gastroparesis    Heart murmur    Hiatal hernia    Osteopenia    Pneumonia    treated as an outpt.    Sinus problem    Sleep apnea    sleep minimal concern- " along time ago"     Family History  Problem Relation Age of Onset   Heart attack Father    Diabetes Father    Heart attack Brother    Diabetes Brother    Diabetes Sister    Breast cancer Sister 14   Diabetes Mother    Breast cancer Mother 75   Parkinson's disease Sister    Heart attack Brother      Current Outpatient Medications:    Ascorbic Acid (VITAMIN C) 500 MG tablet, Take 500 mg by mouth daily., Disp: , Rfl:    Cholecalciferol (VITAMIN D PO), Take 1 tablet by mouth daily., Disp: , Rfl:    CRESTOR 5 MG tablet, Take 1 tablet (5 mg total) by mouth daily., Disp: 90 tablet, Rfl: 2   ELDERBERRY PO, Take by mouth., Disp: , Rfl:    glucose blood (CONTOUR NEXT TEST) test strip, Use as instructed TO TEST BLOOD SUGARS ONCE DAILY. DX: E11.9, Disp: 100 each, Rfl: 3   KRILL OIL PO, Take 1 capsule by mouth daily. , Disp: ,  Rfl:    Magnesium Oxide 250 MG TABS, Take 400 mg by mouth daily. , Disp: , Rfl:    Multiple Vitamin (MULTIVITAMIN PO), Take 1 tablet by mouth daily., Disp: , Rfl:    Probiotic Product (PROBIOTIC PO), Take 1 capsule by mouth daily., Disp: , Rfl:    Semaglutide (RYBELSUS) 7 MG TABS, Take 1 tablet by mouth every morning., Disp: 90 tablet, Rfl: 2   Allergies  Allergen Reactions   Darvon Hives and Itching   Penicillins Hives and Itching    Has patient had a PCN reaction causing immediate rash, facial/tongue/throat swelling, SOB or lightheadedness with hypotension: No Has patient had a PCN reaction causing severe rash  involving mucus membranes or skin necrosis: No Has patient had a PCN reaction that required hospitalization: No Has patient had a PCN reaction occurring within the last 10 years: No If all of the above answers are "NO", then may proceed with Cephalosporin use.      Review of Systems  Constitutional: Negative.   Respiratory: Negative.    Cardiovascular: Negative.   Endocrine: Negative for polydipsia, polyphagia and polyuria.  Musculoskeletal:  Negative for myalgias.  Neurological: Negative.   Psychiatric/Behavioral: Negative.       Today's Vitals   10/08/21 1408  BP: 124/80  Temp: 98.1 F (36.7 C)  SpO2: 92%  Weight: 166 lb 3.2 oz (75.4 kg)  Height: '5\' 4"'  (1.626 m)  PainSc: 0-No pain   Body mass index is 28.53 kg/m.  Wt Readings from Last 3 Encounters:  10/08/21 166 lb 3.2 oz (75.4 kg)  03/21/21 166 lb 10.7 oz (75.6 kg)  03/21/21 166 lb 9.6 oz (75.6 kg)    Objective:  Physical Exam Vitals and nursing note reviewed.  Constitutional:      Appearance: Normal appearance.  HENT:     Head: Normocephalic and atraumatic.  Eyes:     Extraocular Movements: Extraocular movements intact.  Cardiovascular:     Rate and Rhythm: Normal rate and regular rhythm.     Pulses:          Dorsalis pedis pulses are 2+ on the right side and 2+ on the left side.     Heart sounds: Normal heart sounds.  Pulmonary:     Effort: Pulmonary effort is normal.     Breath sounds: Normal breath sounds.  Musculoskeletal:     Cervical back: Normal range of motion.  Feet:     Right foot:     Protective Sensation: 5 sites tested.  5 sites sensed.     Skin integrity: Skin integrity normal.     Toenail Condition: Right toenails are normal.     Left foot:     Protective Sensation: 5 sites tested.  5 sites sensed.     Skin integrity: Skin integrity normal.     Toenail Condition: Left toenails are normal.  Skin:    General: Skin is warm.  Neurological:     General: No focal deficit present.      Mental Status: She is alert.  Psychiatric:        Mood and Affect: Mood normal.        Behavior: Behavior normal.      Assessment And Plan:     1. Type 2 diabetes mellitus with hyperlipidemia (HCC) Comments: Diabetic foot exam was performed. I will check labs as below. I will start PA process for both Rybelsus and brand name Crestor. She has not been able to tolerate the generic version. She  is due Feb 2024 for her next visit. LDL goal <70.  - POCT Urinalysis Dipstick (81002) - Microalbumin / creatinine urine ratio - CMP14+EGFR - Hemoglobin A1c - Lipid panel  2. Body mass index (BMI) 28.0-28.9, adult Comments: Her BMI is acceptable for her demographic. She is encouraged to aim for at least 150 minutes of exercise per week.    Patient was given opportunity to ask questions. Patient verbalized understanding of the plan and was able to repeat key elements of the plan. All questions were answered to their satisfaction.   I, [name] Greenland, MD, have reviewed all documentation for this visit. The documentation on 10/08/21 for the exam, diagnosis, procedures, and orders are all accurate and complete.   IF YOU HAVE BEEN REFERRED TO A SPECIALIST, IT MAY TAKE 1-2 WEEKS TO SCHEDULE/PROCESS THE REFERRAL. IF YOU HAVE NOT HEARD FROM US/SPECIALIST IN TWO WEEKS, PLEASE GIVE [place] A CALL AT [phone].   THE PATIENT IS ENCOURAGED TO PRACTICE SOCIAL DISTANCING DUE TO THE COVID-19 PANDEMIC.

## 2021-10-08 NOTE — Patient Instructions (Signed)

## 2021-10-09 LAB — CMP14+EGFR
ALT: 18 IU/L (ref 0–32)
AST: 23 IU/L (ref 0–40)
Albumin/Globulin Ratio: 2.1 (ref 1.2–2.2)
Albumin: 4.4 g/dL (ref 3.8–4.8)
Alkaline Phosphatase: 107 IU/L (ref 44–121)
BUN/Creatinine Ratio: 12 (ref 12–28)
BUN: 7 mg/dL — ABNORMAL LOW (ref 8–27)
Bilirubin Total: 0.4 mg/dL (ref 0.0–1.2)
CO2: 26 mmol/L (ref 20–29)
Calcium: 10.3 mg/dL (ref 8.7–10.3)
Chloride: 105 mmol/L (ref 96–106)
Creatinine, Ser: 0.59 mg/dL (ref 0.57–1.00)
Globulin, Total: 2.1 g/dL (ref 1.5–4.5)
Glucose: 105 mg/dL — ABNORMAL HIGH (ref 70–99)
Potassium: 4.7 mmol/L (ref 3.5–5.2)
Sodium: 147 mmol/L — ABNORMAL HIGH (ref 134–144)
Total Protein: 6.5 g/dL (ref 6.0–8.5)
eGFR: 96 mL/min/{1.73_m2} (ref >59)

## 2021-10-09 LAB — LIPID PANEL
Chol/HDL Ratio: 2.1 ratio (ref 0.0–4.4)
Cholesterol, Total: 167 mg/dL (ref 100–199)
HDL: 78 mg/dL (ref >39)
LDL Chol Calc (NIH): 71 mg/dL (ref 0–99)
Triglycerides: 104 mg/dL (ref 0–149)
VLDL Cholesterol Cal: 18 mg/dL (ref 5–40)

## 2021-10-09 LAB — MICROALBUMIN / CREATININE URINE RATIO
Creatinine, Urine: 22.6 mg/dL
Microalb/Creat Ratio: 15 mg/g creat (ref 0–29)
Microalbumin, Urine: 3.4 ug/mL

## 2021-10-09 LAB — HEMOGLOBIN A1C
Est. average glucose Bld gHb Est-mCnc: 146 mg/dL
Hgb A1c MFr Bld: 6.7 % — ABNORMAL HIGH (ref 4.8–5.6)

## 2021-10-24 DIAGNOSIS — R6881 Early satiety: Secondary | ICD-10-CM | POA: Diagnosis not present

## 2021-10-24 DIAGNOSIS — K219 Gastro-esophageal reflux disease without esophagitis: Secondary | ICD-10-CM | POA: Diagnosis not present

## 2021-10-24 DIAGNOSIS — K920 Hematemesis: Secondary | ICD-10-CM | POA: Diagnosis not present

## 2021-10-24 DIAGNOSIS — R1013 Epigastric pain: Secondary | ICD-10-CM | POA: Diagnosis not present

## 2021-10-25 DIAGNOSIS — B3781 Candidal esophagitis: Secondary | ICD-10-CM | POA: Diagnosis not present

## 2021-10-25 DIAGNOSIS — K253 Acute gastric ulcer without hemorrhage or perforation: Secondary | ICD-10-CM | POA: Diagnosis not present

## 2021-10-25 DIAGNOSIS — K219 Gastro-esophageal reflux disease without esophagitis: Secondary | ICD-10-CM | POA: Diagnosis not present

## 2021-10-25 DIAGNOSIS — K269 Duodenal ulcer, unspecified as acute or chronic, without hemorrhage or perforation: Secondary | ICD-10-CM | POA: Diagnosis not present

## 2021-10-25 DIAGNOSIS — K259 Gastric ulcer, unspecified as acute or chronic, without hemorrhage or perforation: Secondary | ICD-10-CM | POA: Diagnosis not present

## 2021-10-25 DIAGNOSIS — R1013 Epigastric pain: Secondary | ICD-10-CM | POA: Diagnosis not present

## 2021-10-25 DIAGNOSIS — R112 Nausea with vomiting, unspecified: Secondary | ICD-10-CM | POA: Diagnosis not present

## 2021-10-25 DIAGNOSIS — K920 Hematemesis: Secondary | ICD-10-CM | POA: Diagnosis not present

## 2021-10-25 LAB — CBC AND DIFFERENTIAL
Hemoglobin: 13.3 (ref 12.0–16.0)
Platelets: 264 10*3/uL (ref 150–400)
WBC: 3.3

## 2021-10-25 LAB — BASIC METABOLIC PANEL
BUN: 8 (ref 4–21)
CO2: 33 — AB (ref 13–22)
Chloride: 104 (ref 99–108)
Creatinine: 0.6 (ref 0.5–1.1)
Glucose: 138
Potassium: 4.7 mEq/L (ref 3.5–5.1)
Sodium: 142 (ref 137–147)

## 2021-10-25 LAB — COMPREHENSIVE METABOLIC PANEL
Calcium: 10.6 (ref 8.7–10.7)
eGFR: 97

## 2021-10-28 ENCOUNTER — Other Ambulatory Visit: Payer: Self-pay

## 2021-10-28 MED ORDER — SITAGLIPTIN PHOSPHATE 100 MG PO TABS: 100.0000 mg | ORAL_TABLET | Freq: Every day | ORAL | 1 refills | Status: DC

## 2021-10-30 DIAGNOSIS — Z23 Encounter for immunization: Secondary | ICD-10-CM | POA: Diagnosis not present

## 2021-10-31 DIAGNOSIS — K259 Gastric ulcer, unspecified as acute or chronic, without hemorrhage or perforation: Secondary | ICD-10-CM | POA: Diagnosis not present

## 2021-10-31 DIAGNOSIS — K219 Gastro-esophageal reflux disease without esophagitis: Secondary | ICD-10-CM | POA: Diagnosis not present

## 2021-10-31 DIAGNOSIS — R110 Nausea: Secondary | ICD-10-CM | POA: Diagnosis not present

## 2021-11-12 DIAGNOSIS — E119 Type 2 diabetes mellitus without complications: Secondary | ICD-10-CM | POA: Diagnosis not present

## 2021-11-12 DIAGNOSIS — H40013 Open angle with borderline findings, low risk, bilateral: Secondary | ICD-10-CM | POA: Diagnosis not present

## 2021-11-21 DIAGNOSIS — E669 Obesity, unspecified: Secondary | ICD-10-CM | POA: Diagnosis not present

## 2021-11-21 DIAGNOSIS — K219 Gastro-esophageal reflux disease without esophagitis: Secondary | ICD-10-CM | POA: Diagnosis not present

## 2021-11-21 DIAGNOSIS — K5900 Constipation, unspecified: Secondary | ICD-10-CM | POA: Diagnosis not present

## 2021-11-21 DIAGNOSIS — K259 Gastric ulcer, unspecified as acute or chronic, without hemorrhage or perforation: Secondary | ICD-10-CM | POA: Diagnosis not present

## 2021-11-26 ENCOUNTER — Ambulatory Visit: Payer: Self-pay | Admitting: Internal Medicine

## 2021-11-26 DIAGNOSIS — Z23 Encounter for immunization: Secondary | ICD-10-CM | POA: Diagnosis not present

## 2021-11-28 DIAGNOSIS — Z9013 Acquired absence of bilateral breasts and nipples: Secondary | ICD-10-CM | POA: Diagnosis not present

## 2021-11-28 DIAGNOSIS — Z01419 Encounter for gynecological examination (general) (routine) without abnormal findings: Secondary | ICD-10-CM | POA: Diagnosis not present

## 2021-11-28 DIAGNOSIS — Z01411 Encounter for gynecological examination (general) (routine) with abnormal findings: Secondary | ICD-10-CM | POA: Diagnosis not present

## 2021-11-28 DIAGNOSIS — Z124 Encounter for screening for malignant neoplasm of cervix: Secondary | ICD-10-CM | POA: Diagnosis not present

## 2021-11-28 DIAGNOSIS — Z6828 Body mass index (BMI) 28.0-28.9, adult: Secondary | ICD-10-CM | POA: Diagnosis not present

## 2021-11-28 DIAGNOSIS — Z780 Asymptomatic menopausal state: Secondary | ICD-10-CM | POA: Diagnosis not present

## 2021-11-29 ENCOUNTER — Encounter: Payer: Self-pay | Admitting: Internal Medicine

## 2021-12-10 ENCOUNTER — Encounter: Payer: Self-pay | Admitting: Internal Medicine

## 2021-12-10 ENCOUNTER — Ambulatory Visit (INDEPENDENT_AMBULATORY_CARE_PROVIDER_SITE_OTHER): Payer: Medicare Other | Admitting: Internal Medicine

## 2021-12-10 VITALS — BP 120/82 | HR 74 | Temp 98.0°F | Ht 64.8 in | Wt 158.6 lb

## 2021-12-10 DIAGNOSIS — E1169 Type 2 diabetes mellitus with other specified complication: Secondary | ICD-10-CM | POA: Diagnosis not present

## 2021-12-10 DIAGNOSIS — E785 Hyperlipidemia, unspecified: Secondary | ICD-10-CM | POA: Diagnosis not present

## 2021-12-10 DIAGNOSIS — K250 Acute gastric ulcer with hemorrhage: Secondary | ICD-10-CM | POA: Diagnosis not present

## 2021-12-10 DIAGNOSIS — Z6826 Body mass index (BMI) 26.0-26.9, adult: Secondary | ICD-10-CM | POA: Diagnosis not present

## 2021-12-10 NOTE — Progress Notes (Signed)
Rich Brave [place],acting as a Education administrator for [name], MD.,have documented all relevant documentation on the behalf of [name], MD,as directed by  [name], MD while in the presence of [name], MD.    Subjective:     Patient ID: [name] [name] , female    DOB: 01/16/1951 , 71 y.o.   MRN: [id number]   Chief Complaint  Patient presents with   Diabetes    HPI   She presents today for diabetes check and cholesterol f/u. She reports compliance with meds. She reports she just got diagnosed with a bleeding ulcer.      She presented to GI on 10/25/21 with complaint of n/v. She stated that she had been vomiting about 30 minutes after each meal for about six weeks prior to presentation. She had also noticed coffee grounds in her emesis. She did take Emycin after a dental extraction. She did take 646m Advil for 3 days for pain. Of note, advised in the past to avoid NSAIDS due to h/o gastric bypass and previous bleeding ulcer. She had EGD performed, significant for large non-bleeding ulcer, esophagitis and esophageal plaques. She was placed on clear diet x 2 weeks, Carafate qid and restarted on Dexilant. She had stopped the Dexilant previously because she was concerned about the long-term effects of PPI usage.   Since the EGD, she has not had any recurrence of sx. She stopped the Rybelsus because GI told her this was the cause of the ulcer. She is now taking Januvia without any issues.                                                                                                                      Diabetes She presents for her follow-up diabetic visit. She has type 2 diabetes mellitus. There are no hypoglycemic associated symptoms. There are no diabetic associated symptoms. Pertinent negatives for diabetes include no polydipsia, no polyphagia and no polyuria. There are no diabetic complications. Risk factors for coronary artery disease include diabetes mellitus,  dyslipidemia, hypertension and post-menopausal. She participates in exercise daily. An ACE inhibitor/angiotensin II receptor blocker is not being taken.  Hyperlipidemia This is a chronic problem. The current episode started more than 1 year ago. The problem is controlled. Exacerbating diseases include diabetes. Current antihyperlipidemic treatment includes statins. The current treatment provides moderate improvement of lipids. There are no compliance problems.  Risk factors for coronary artery disease include post-menopausal, diabetes mellitus and dyslipidemia.     Past Medical History:  Diagnosis Date   Arthritis    back & hip- R   [place]   left-bi[place]   [place]   right mastectomy   Bronchitis    [place]    [place]   [place]    [place]   Diabetes mellitus    Diabetes mellitus without complication (HCC)    GERD (gastroesophageal reflux disease)  pt. experiencing on occasional , h/o og gastroparesis    Heart murmur    Hiatal hernia    Osteopenia    Pneumonia    treated as an outpt.    Sinus problem    Sleep apnea    sleep minimal concern- " along time ago"     Family History  Problem Relation Age of Onset   Heart attack Father    Diabetes Father    Heart attack Brother    Diabetes Brother    Diabetes Sister    Breast cancer Sister 6   Diabetes Mother    Breast cancer Mother 56   Parkinson's disease Sister    Heart attack Brother      Current Outpatient Medications:    Ascorbic Acid (VITAMIN C) 500 MG tablet, Take 500 mg by mouth daily., Disp: , Rfl:    Cholecalciferol (VITAMIN D PO), Take 1 tablet by mouth daily., Disp: , Rfl:    CRESTOR 5 MG tablet, Take 1 tablet (5 mg total) by mouth daily., Disp: 90 tablet, Rfl: 2   dexlansoprazole (DEXILANT) 60 MG capsule, Take 1 capsule by mouth every morning., Disp: , Rfl:    ELDERBERRY PO, Take by  mouth., Disp: , Rfl:    glucose blood (CONTOUR NEXT TEST) test strip, Use as instructed TO TEST BLOOD SUGARS ONCE DAILY. DX: E11.9, Disp: 100 each, Rfl: 3   KRILL OIL PO, Take 1 capsule by mouth daily. , Disp: , Rfl:    Magnesium Oxide 250 MG TABS, Take 400 mg by mouth daily. , Disp: , Rfl:    Multiple Vitamin (MULTIVITAMIN PO), Take 1 tablet by mouth daily., Disp: , Rfl:    Probiotic Product (PROBIOTIC PO), Take 1 capsule by mouth daily., Disp: , Rfl:    sitaGLIPtin (JANUVIA) 100 MG tablet, Take 1 tablet (100 mg total) by mouth daily., Disp: 90 tablet, Rfl: 1   Allergies  Allergen Reactions   Darvon Hives and Itching   Penicillins Hives and Itching    Has patient had a PCN reaction causing immediate rash, facial/tongue/throat swelling, SOB or lightheadedness with hypotension: No Has patient had a PCN reaction causing severe rash involving mucus membranes or skin necrosis: No Has patient had a PCN reaction that required hospitalization: No Has patient had a PCN reaction occurring within the last 10 years: No If all of the above answers are "NO", then may proceed with Cephalosporin use.    Clindamycin/Lincomycin Other (See Comments)    vomiting     Review of Systems  Constitutional: Negative.   Eyes: Negative.   Respiratory: Negative.    Cardiovascular: Negative.   Gastrointestinal: Negative.   Endocrine: Negative for polydipsia, polyphagia and polyuria.  Musculoskeletal: Negative.   Skin: Negative.   Neurological: Negative.   Psychiatric/Behavioral: Negative.       Today's Vitals   12/10/21 1502  BP: 120/82  Pulse: 74  Temp: 98 F (36.7 C)  Weight: 158 lb 9.6 oz (71.9 kg)  Height: 5' 4.8" (1.646 m)  PainSc: 0-No pain   Body mass index is 26.56 kg/m.  Wt Readings from Last 3 Encounters:  12/10/21 158 lb 9.6 oz (71.9 kg)  10/08/21 166 lb 3.2 oz (75.4 kg)  03/21/21 166 lb 10.7 oz (75.6 kg)    Objective:  Physical Exam Vitals and nursing note reviewed.   Constitutional:      Appearance: Normal appearance. She is not diaphoretic.  HENT:     Head: Normocephalic and atraumatic.  Nose:     Comments: Masked     Mouth/Throat:     Pharynx: No oropharyngeal exudate.     Comments: Masked Eyes:     Extraocular Movements: Extraocular movements intact.  Cardiovascular:     Rate and Rhythm: Normal rate and regular rhythm.     Heart sounds: Normal heart sounds.  Pulmonary:     Effort: Pulmonary effort is normal.     Breath sounds: Normal breath sounds.  Musculoskeletal:     Cervical back: Normal range of motion.  Skin:    General: Skin is warm.  Neurological:     General: No focal deficit present.     Mental Status: She is alert.  Psychiatric:        Mood and Affect: Mood normal.        Behavior: Behavior normal.       Assessment And Plan:     1. Type 2 diabetes mellitus with hyperlipidemia (HCC) Comments: Chronic, now on Januvia. We discussed use of SGLT2 inh, will stay on Januvia for now. Pt advised that I do not think Rybelsus caused her GI bleed, it is more likely that the Advil she took for her tooth pain contributed to her symptoms. She will continue to take Januvia for another 90 days. At her next visit, we will discuss the use of SGLT2 inhibitors. She is in agreement with her treatment plan. All questions were answered to her satisfaction.  - BMP8+EGFR - Hemoglobin A1c; Future  2. Acute gastric ulcer with hemorrhage Comments: Resolved. GI notes reviewed in full detail.  I will check CBC today.  She will c/w Dexilant as well.  Reminded to avoid NSAIDs. - CBC no Diff  3. BMI 26.0-26.9,adult Comments: She is encouraged to continue exercise at least 150 minutes per week.    Patient was given opportunity to ask questions. Patient verbalized understanding of the plan and was able to repeat key elements of the plan. All questions were answered to their satisfaction.   I, [name], MD, have reviewed all documentation for  this visit. The documentation on 12/10/21 for the exam, diagnosis, procedures, and orders are all accurate and complete.   IF YOU HAVE BEEN REFERRED TO A SPECIALIST, IT MAY TAKE 1-2 WEEKS TO SCHEDULE/PROCESS THE REFERRAL. IF YOU HAVE NOT HEARD FROM US/SPECIALIST IN TWO WEEKS, PLEASE GIVE [place] A CALL AT [phone] X [phone].   THE PATIENT IS ENCOURAGED TO PRACTICE SOCIAL DISTANCING DUE TO THE COVID-19 PANDEMIC.

## 2021-12-10 NOTE — Patient Instructions (Signed)

## 2021-12-11 LAB — BMP8+EGFR
BUN/Creatinine Ratio: 16 (ref 12–28)
BUN: 9 mg/dL (ref 8–27)
CO2: 29 mmol/L (ref 20–29)
Calcium: 10.5 mg/dL — ABNORMAL HIGH (ref 8.7–10.3)
Chloride: 101 mmol/L (ref 96–106)
Creatinine, Ser: 0.57 mg/dL (ref 0.57–1.00)
Glucose: 135 mg/dL — ABNORMAL HIGH (ref 70–99)
Potassium: 4.3 mmol/L (ref 3.5–5.2)
Sodium: 143 mmol/L (ref 134–144)
eGFR: 97 mL/min/{1.73_m2} (ref >59)

## 2021-12-11 LAB — CBC
Hematocrit: 43.5 % (ref 34.0–46.6)
Hemoglobin: 14.4 g/dL (ref 11.1–15.9)
MCH: 29.8 pg (ref 26.6–33.0)
MCHC: 33.1 g/dL (ref 31.5–35.7)
MCV: 90 fL (ref 79–97)
Platelets: 268 10*3/uL (ref 150–450)
RBC: 4.84 x10E6/uL (ref 3.77–5.28)
RDW: 11.8 % (ref 11.7–15.4)
WBC: 5.9 10*3/uL (ref 3.4–10.8)

## 2021-12-16 DIAGNOSIS — M792 Neuralgia and neuritis, unspecified: Secondary | ICD-10-CM | POA: Diagnosis not present

## 2021-12-16 DIAGNOSIS — M779 Enthesopathy, unspecified: Secondary | ICD-10-CM | POA: Diagnosis not present

## 2021-12-16 DIAGNOSIS — G8929 Other chronic pain: Secondary | ICD-10-CM | POA: Diagnosis not present

## 2021-12-16 DIAGNOSIS — M1990 Unspecified osteoarthritis, unspecified site: Secondary | ICD-10-CM | POA: Diagnosis not present

## 2022-01-15 ENCOUNTER — Telehealth: Payer: Self-pay

## 2022-01-15 NOTE — Telephone Encounter (Signed)
Pa sent to plan for crestor

## 2022-01-20 ENCOUNTER — Other Ambulatory Visit: Payer: Self-pay | Admitting: Internal Medicine

## 2022-02-04 ENCOUNTER — Telehealth: Payer: Self-pay | Admitting: Adult Health

## 2022-02-04 ENCOUNTER — Other Ambulatory Visit: Payer: Medicare Other

## 2022-02-04 DIAGNOSIS — E1169 Type 2 diabetes mellitus with other specified complication: Secondary | ICD-10-CM | POA: Diagnosis not present

## 2022-02-04 DIAGNOSIS — E785 Hyperlipidemia, unspecified: Secondary | ICD-10-CM | POA: Diagnosis not present

## 2022-02-04 NOTE — Telephone Encounter (Signed)
Rescheduled appointment per provider admin time. Left voicemail.

## 2022-02-05 LAB — HEMOGLOBIN A1C
Est. average glucose Bld gHb Est-mCnc: 134 mg/dL
Hgb A1c MFr Bld: 6.3 % — ABNORMAL HIGH (ref 4.8–5.6)

## 2022-03-05 ENCOUNTER — Other Ambulatory Visit: Payer: Medicare Other

## 2022-03-05 ENCOUNTER — Encounter: Payer: Medicare Other | Admitting: Adult Health

## 2022-03-05 DIAGNOSIS — H40013 Open angle with borderline findings, low risk, bilateral: Secondary | ICD-10-CM | POA: Diagnosis not present

## 2022-03-05 DIAGNOSIS — E119 Type 2 diabetes mellitus without complications: Secondary | ICD-10-CM | POA: Diagnosis not present

## 2022-03-06 ENCOUNTER — Other Ambulatory Visit: Payer: Self-pay

## 2022-03-06 DIAGNOSIS — C50812 Malignant neoplasm of overlapping sites of left female breast: Secondary | ICD-10-CM

## 2022-03-07 ENCOUNTER — Encounter: Payer: Self-pay | Admitting: Adult Health

## 2022-03-07 ENCOUNTER — Inpatient Hospital Stay: Payer: Medicare Other | Attending: Adult Health

## 2022-03-07 ENCOUNTER — Inpatient Hospital Stay (HOSPITAL_BASED_OUTPATIENT_CLINIC_OR_DEPARTMENT_OTHER): Payer: Medicare Other | Admitting: Adult Health

## 2022-03-07 VITALS — BP 160/76 | HR 84 | Temp 97.9°F | Resp 16 | Ht 64.0 in | Wt 167.7 lb

## 2022-03-07 DIAGNOSIS — Z833 Family history of diabetes mellitus: Secondary | ICD-10-CM | POA: Insufficient documentation

## 2022-03-07 DIAGNOSIS — Z803 Family history of malignant neoplasm of breast: Secondary | ICD-10-CM | POA: Insufficient documentation

## 2022-03-07 DIAGNOSIS — C50111 Malignant neoplasm of central portion of right female breast: Secondary | ICD-10-CM

## 2022-03-07 DIAGNOSIS — Z171 Estrogen receptor negative status [ER-]: Secondary | ICD-10-CM

## 2022-03-07 DIAGNOSIS — Z8249 Family history of ischemic heart disease and other diseases of the circulatory system: Secondary | ICD-10-CM | POA: Diagnosis not present

## 2022-03-07 DIAGNOSIS — Z87891 Personal history of nicotine dependence: Secondary | ICD-10-CM | POA: Diagnosis not present

## 2022-03-07 DIAGNOSIS — Z79899 Other long term (current) drug therapy: Secondary | ICD-10-CM | POA: Insufficient documentation

## 2022-03-07 DIAGNOSIS — Z9884 Bariatric surgery status: Secondary | ICD-10-CM | POA: Diagnosis not present

## 2022-03-07 DIAGNOSIS — Z9882 Breast implant status: Secondary | ICD-10-CM | POA: Insufficient documentation

## 2022-03-07 DIAGNOSIS — Z170 Estrogen receptor positive status [ER+]: Secondary | ICD-10-CM | POA: Insufficient documentation

## 2022-03-07 DIAGNOSIS — Z818 Family history of other mental and behavioral disorders: Secondary | ICD-10-CM | POA: Diagnosis not present

## 2022-03-07 DIAGNOSIS — C50812 Malignant neoplasm of overlapping sites of left female breast: Secondary | ICD-10-CM | POA: Insufficient documentation

## 2022-03-07 LAB — CBC WITH DIFFERENTIAL (CANCER CENTER ONLY)
Abs Immature Granulocytes: 0.01 10*3/uL (ref 0.00–0.07)
Basophils Absolute: 0 10*3/uL (ref 0.0–0.1)
Basophils Relative: 0 %
Eosinophils Absolute: 0 10*3/uL (ref 0.0–0.5)
Eosinophils Relative: 1 %
HCT: 43.6 % (ref 36.0–46.0)
Hemoglobin: 14.9 g/dL (ref 12.0–15.0)
Immature Granulocytes: 0 %
Lymphocytes Relative: 31 %
Lymphs Abs: 1.6 10*3/uL (ref 0.7–4.0)
MCH: 30.8 pg (ref 26.0–34.0)
MCHC: 34.2 g/dL (ref 30.0–36.0)
MCV: 90.3 fL (ref 80.0–100.0)
Monocytes Absolute: 0.4 10*3/uL (ref 0.1–1.0)
Monocytes Relative: 8 %
Neutro Abs: 3 10*3/uL (ref 1.7–7.7)
Neutrophils Relative %: 60 %
Platelet Count: 264 10*3/uL (ref 150–400)
RBC: 4.83 MIL/uL (ref 3.87–5.11)
RDW: 13.2 % (ref 11.5–15.5)
WBC Count: 5.1 10*3/uL (ref 4.0–10.5)
nRBC: 0 % (ref 0.0–0.2)

## 2022-03-07 LAB — CMP (CANCER CENTER ONLY)
ALT: 16 U/L (ref 0–44)
AST: 24 U/L (ref 15–41)
Albumin: 4.3 g/dL (ref 3.5–5.0)
Alkaline Phosphatase: 93 U/L (ref 38–126)
Anion gap: 8 (ref 5–15)
BUN: 12 mg/dL (ref 8–23)
CO2: 33 mmol/L — ABNORMAL HIGH (ref 22–32)
Calcium: 10.8 mg/dL — ABNORMAL HIGH (ref 8.9–10.3)
Chloride: 102 mmol/L (ref 98–111)
Creatinine: 0.58 mg/dL (ref 0.44–1.00)
GFR, Estimated: >60 mL/min (ref >60)
Glucose, Bld: 112 mg/dL — ABNORMAL HIGH (ref 70–99)
Potassium: 3.7 mmol/L (ref 3.5–5.1)
Sodium: 143 mmol/L (ref 135–145)
Total Bilirubin: 0.5 mg/dL (ref 0.3–1.2)
Total Protein: 7 g/dL (ref 6.5–8.1)

## 2022-03-07 NOTE — Patient Instructions (Signed)
Hypercalcemia Hypercalcemia is when the level of calcium in a person's blood is above normal. The body needs calcium to make bones and keep them strong. Calcium also helps the muscles, nerves, brain, and heart work the way they should. Most of the calcium in the body is stored in the bones. There is also calcium in the blood. Hypercalcemia occurs when there is too much calcium in your blood. Calcium levels in the blood are regulated by hormones, kidneys, and the gastrointestinal tract.  Hypercalcemia can happen when calcium comes out of the bones, or when the kidneys are not able to remove calcium from the blood. Hypercalcemia can be mild or severe. What are the causes? There are many possible causes of hypercalcemia. Common causes of this condition include: Hyperparathyroidism. This is a condition in which the body produces too much parathyroid hormone. There are four parathyroid glands in your neck. These glands produce a chemical messenger (hormone) that helps the body absorb calcium from foods and helps your bones release calcium. Less common causes of hypercalcemia include: Calcium and vitamin D dietary supplements. Chronic kidney disease. Hyperthyroidism. Severe dehydration. Being on bed rest or being inactive for a long time. Certain medicines. Infections. What increases the risk? You are more likely to develop this condition if: You are female. You are 25 years of age or older. You have a family history of hypercalcemia. What are the signs or symptoms? Mild hypercalcemia that starts slowly may not cause symptoms. Severe, sudden hypercalcemia is more likely to cause symptoms, such as: Being more thirsty than usual. Needing to urinate more often than usual. Abdominal pain. Nausea and vomiting. Constipation. Muscle pain, twitching, or weakness. Feeling very tired. How is this diagnosed?  Hypercalcemia is usually diagnosed with a blood test. You may also have tests to help check  what is causing this condition. Tests include imaging tests and more blood tests. How is this treated? Treatment for hypercalcemia depends on the cause. Treatment may include: Receiving fluids through an IV. Medicines. These can be used to: Keep calcium levels steady after receiving fluids (loop diuretics). Keep calcium in your bones (bisphosphonates). Lower the calcium level in your blood. Surgery to remove overactive parathyroid glands. A procedure that filters your blood to correct calcium levels (hemodialysis). Follow these instructions at home:  Take over-the-counter and prescription medicines only as told by your health care provider. Follow instructions from your health care provider about eating or drinking restrictions. Drink enough fluid to keep your urine pale yellow. Stay active. Weight-bearing exercise helps to keep calcium in your bones. Follow instructions from your health care provider about what type and level of exercise is safe for you. Keep all follow-up visits. This is important. Contact a health care provider if: You have a fever. Your heartbeat is irregular or very fast. You have changes in mood, memory, or personality. Get help right away if: You have severe abdominal pain. You have chest pain. You have trouble breathing. You become very confused and sleepy. You lose consciousness. These symptoms may represent a serious problem that is an emergency. Do not wait to see if the symptoms will go away. Get medical help right away. Call your local emergency services (911 in the [place] Do not drive yourself to the hospital. Summary Hypercalcemia is when the level of calcium in a person's blood is above normal. The body needs calcium to make bones and keep them strong. There are many possible causes of hypercalcemia, and treatment depends on the cause. Take over-the-counter  and prescription medicines only as told by your health care provider. This information is not  intended to replace advice given to you by your health care provider. Make sure you discuss any questions you have with your health care provider. Document Revised: 07/11/2020 Document Reviewed: 07/11/2020 Else[employer] Patient Education  [employer].

## 2022-03-07 NOTE — Assessment & Plan Note (Addendum)
[name] has a history of bilateral breast cancer 1 triple positive the other triple negative.  She has no clinical or radiographic signs of breast cancer recurrence.  Her breast exam today is benign.  We discussed Signatera testing today.  She is going to read about it.  I am not sure how helpful it would be considering she is 10 years out from her initial diagnosis but nonetheless I did give her that information.  Her calcium level is elevated at 10.8.  I gave her a handout on hypercalcemia.  We will add on a PTH level today and if it is normal we will consider ordering a bone scan.  Recommended continued healthy diet, exercise, and follow-up with her primary care provider to stay up-to-date on her health maintenance.  We will see [name] [name] back in 1 year for continued long-term follow-up or sooner if needed.

## 2022-03-07 NOTE — Progress Notes (Signed)
[facility] Follow up:    [name] [name], [address] [address]   DIAGNOSIS: h/o bilateral breast cancer  SUMMARY OF ONCOLOGIC HISTORY: BRCA negative [place] woman status post bilateral mastectomies 05/14/2012             (a) on the left, mpT1c pN0, stage IA invasive ductal carcinoma, grade 2, estrogen receptor 100% positive, progesterone receptor 11% positive, with an MIB-1 of 80%, and HER-2 amplified, the ratio being 6.50.             (b) on the right ,pT1c pN0, stage IA invasive ductal carcinoma, grade 3, triple negative, with an MIB-1 of 92%   (1) treated adjuvantly with doxorubicin and cyclophosphamide in dose dense fashion x4, completed 08/13/2012, followed by weekly Abraxane x12 given with concurrent trastuzumab, completed 12/03/2012   (2) received trastuzumab between 08/27/2012 and 06/29/2013, discontinued because of concerns regarding weakening of the heart muscle             (a) echo 07/25/2013 showed an ejection fraction of 55-60%   (3) letrozole started November 2014; completed 5 years November 2019    (4) bone density 03/01/2013 showed osteopenia with a T score of - 1.8   (5) s/p bilateral implant reconstruction with immediate expander placement (54/10/8117) with silicone implant reconstruction, 800 cc round shaped implants.   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 There were no deleterious mutations in the genetic panel tested, which included ATM, BARD1, BRCA1, BRCA2, BRIP1, CDH1, CHEK2, EPCAM, FANCC, MLH1, MSH2, MSH6, NBN, PALB2, PMS2, PTEN, RAD51C, RAD51D, STK11, TP53, and XRCC2  CURRENT THERAPY: Observation  INTERVAL HISTORY: [name] [name] 72 y.o. female returns for f/u of her history of breast cancer that was diagnosed 10 years ago.  She is doing well today.  She has a skin place on her back that itches, but she was evaluated by dermatology  who said it was benign.  She denies any new aches or pains.  She sees her primary care provider regularly and is scheduled to go back next month.  Her calcium level is mildly elevated at 10.8 today.  It was 10.53 months ago.  She drinks about a gallon of water a day.  She does not take a calcium supplement.  She eats [place] yogurt 1 cup/day but no other calcium intake that she is aware of.   Patient Active Problem List   Diagnosis Date Noted   Silicone leakage from breast implant 04/07/2020   Overweight with body mass index (BMI) of 28 to 28.9 in adult 04/07/2020   Uncontrolled type 2 diabetes mellitus with hyperglycemia ([place]) 04/07/2020   Malignant neoplasm of overlapping sites of left breast in female, estrogen receptor positive ([place]) 12/29/2017   Malignant neoplasm of central portion of right breast in female, estrogen receptor negative ([place]) 12/29/2017   Primary osteoarthritis of right hip 03/02/2017   Osteoarthritis of right hip 02/27/2017   Genetic testing 11/17/2014   Vaginal dryness 07/25/2014   Dyspnea 07/19/2013   Chemotherapy adverse reaction 07/14/2013   Hyperlipidemia 02/01/2012   GERD (gastroesophageal reflux disease) 02/01/2012   Diabetes mellitus ([place]) 02/01/2012   Osteopenia     is allergic to darvon, penicillins, and clindamycin/lincomycin.  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 experiencing on occasional , h/o og gastroparesis    Heart murmur    Hiatal hernia    Osteopenia    Pneumonia    treated as an outpt.    Sinus problem    Sleep apnea    sleep minimal  concern- " along time ago"    SURGICAL HISTORY: Past Surgical History:  Procedure Laterality Date   AXILLARY SENTINEL NODE BIOPSY Left 05/14/2012   Procedure: AXILLARY SENTINEL NODE BIOPSY;  Surgeon: Adin Hector, MD;  Location: [facility];  Service: General;  Laterality: Left;   BREAST RECONSTRUCTION WITH PLACEMENT OF TISSUE EXPANDER AND FLEX HD (ACELLULAR HYDRATED DERMIS) Bilateral 05/14/2012   Procedure: BREAST RECONSTRUCTION WITH PLACEMENT OF TISSUE EXPANDER AND FLEX HD (ACELLULAR HYDRATED DERMIS);  Surgeon: Crissie Reese, MD;  Location: [facility];  Service: Plastics;  Laterality: Bilateral;   BUNIONECTOMY Bilateral    CESAREAN SECTION  1981, 1983   FOOT SURGERY     GASTRIC BYPASS     1st surgery- at [facility], then an emergent surgery here  [facility]- for perforation of stomach & then a surgery for debridement    PORT-A-CATH REMOVAL Right 09/12/2013   Procedure: REMOVAL PORT-A-CATH;  Surgeon: Adin Hector, MD;  Location: [facility];  Service: General;  Laterality: Right;   PORTACATH PLACEMENT N/A 05/14/2012   Procedure: INSERTION PORT-A-CATH;  Surgeon: Adin Hector, MD;  Location: [facility];  Service: General;  Laterality: N/A;   SHOULDER SURGERY Left 06/2019   TONSILLECTOMY     TOTAL HIP ARTHROPLASTY Right 03/02/2017   TOTAL HIP ARTHROPLASTY Right 03/02/2017   Procedure: RIGHT TOTAL HIP ARTHROPLASTY ANTERIOR APPROACH;  Surgeon: Frederik Pear, MD;  Location: [facility];  Service: Orthopedics;  Laterality: Right;   TOTAL MASTECTOMY Bilateral 05/14/2012   Procedure: TOTAL MASTECTOMY;  Surgeon: Adin Hector, MD;  Location: [facility];  Service: General;  Laterality: Bilateral;   tummy tuck  1999   WOUND DEBRIDEMENT  01/14/2011   Procedure: DEBRIDEMENT ABDOMINAL WOUND;  Surgeon: Adin Hector, MD;  Location: [facility];  Service: General;  Laterality: N/A;  wound debridement and debridement on the abdomen    SOCIAL HISTORY: Social History   Socioeconomic History    Marital status: Married    Spouse name: [name] [name]   Number of children: 2   Years of education: 18   Highest education level: Not on file  Occupational History   Occupation: Therapist, 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[place], [place]   Occupation: retired  Tobacco Use   Smoking status: Former    Types: Cigarettes    Quit date: 02/25/2006    Years since quitting: 16.0   Smokeless tobacco: Never  Vaping Use   Vaping Use: Never used  Substance and Sexual Activity   Alcohol use: Yes    Alcohol/week: 2.0 - 3.0 standard drinks of alcohol    Types: 2 - 3 Glasses of wine per week    Comment: no liquor   Drug use: No   Sexual activity: Yes    Partners: Male    Birth control/protection: Post-menopausal  Other Topics Concern   Not on file  Social History Narrative   Not on  file   Social Determinants of Health   Financial Resource Strain: Low Risk  (03/21/2021)   Overall Financial Resource Strain (CARDIA)    Difficulty of Paying Living Expenses: Not hard at all  Food Insecurity: No Food Insecurity (03/21/2021)   Hunger Vital Sign    Worried About Running Out of Food in the Last Year: Never true    Ran Out of Food in the Last Year: Never true  Transportation Needs: No Transportation Needs (03/21/2021)   PRAPARE - Hydrologist (Medical): No    Lack of Transportation (Non-Medical): No  Physical Activity: Sufficiently Active (03/21/2021)   Exercise Vital Sign    Days of Exercise per Week: 7 days    Minutes of Exercise per Session: 40 min  Stress: No Stress Concern Present (03/21/2021)   [place]    Feeling of Stress : Not at all  Social Connections: Not on file  Intimate Partner Violence: Not At Risk (03/03/2018)   Humiliation, Afraid, Rape, and Kick questionnaire    Fear of Current or Ex-Partner: No    Emotionally Abused: No    Physically Abused: No    Sexually Abused: No    FAMILY HISTORY: Family  History  Problem Relation Age of Onset   Heart attack Father    Diabetes Father    Heart attack Brother    Diabetes Brother    Diabetes Sister    Breast cancer Sister 78   Diabetes Mother    Breast cancer Mother 30   Parkinson's disease Sister    Heart attack Brother     Review of Systems  Constitutional:  Negative for appetite change, chills, fatigue, fever and unexpected weight change.  HENT:   Negative for hearing loss, lump/mass and trouble swallowing.   Eyes:  Negative for eye problems and icterus.  Respiratory:  Negative for chest tightness, cough and shortness of breath.   Cardiovascular:  Negative for chest pain, leg swelling and palpitations.  Gastrointestinal:  Negative for abdominal distention, abdominal pain, constipation, diarrhea, nausea and vomiting.  Endocrine: Negative for hot flashes.  Genitourinary:  Negative for difficulty urinating.   Musculoskeletal:  Negative for arthralgias.  Skin:  Negative for itching and rash.  Neurological:  Negative for dizziness, extremity weakness, headaches and numbness.  Hematological:  Negative for adenopathy. Does not bruise/bleed easily.  Psychiatric/Behavioral:  Negative for depression. The patient is not nervous/anxious.       PHYSICAL EXAMINATION  ECOG PERFORMANCE STATUS: 0 - Asymptomatic  Vitals:   03/07/22 1445  BP: (!) 160/76  Pulse: 84  Resp: 16  Temp: 97.9 F (36.6 C)  SpO2: 100%    Physical Exam Constitutional:      General: She is not in acute distress.    Appearance: Normal appearance. She is not toxic-appearing.  HENT:     Head: Normocephalic and atraumatic.  Eyes:     General: No scleral icterus. Cardiovascular:     Rate and Rhythm: Normal rate and regular rhythm.     Pulses: Normal pulses.     Heart sounds: Normal heart sounds.  Pulmonary:     Effort: Pulmonary effort is normal.     Breath sounds: Normal breath sounds.  Chest:     Comments: Status post bilateral mastectomies with  reconstruction no sign of local recurrence. Abdominal:     General: Abdomen is flat. Bowel sounds are normal. There is no distension.     Palpations: Abdomen  is soft.     Tenderness: There is no abdominal tenderness.  Musculoskeletal:        General: No swelling.     Cervical back: Neck supple.  Lymphadenopathy:     Cervical: No cervical adenopathy.  Skin:    General: Skin is warm and dry.     Findings: No rash.  Neurological:     General: No focal deficit present.     Mental Status: She is alert.  Psychiatric:        Mood and Affect: Mood normal.        Behavior: Behavior normal.     LABORATORY DATA:  CBC    Component Value Date/Time   WBC 5.1 03/07/2022 1436   WBC 4.8 12/29/2017 1054   RBC 4.83 03/07/2022 1436   HGB 14.9 03/07/2022 1436   HGB 14.4 12/10/2021 1555   HGB 13.2 12/30/2016 1045   HCT 43.6 03/07/2022 1436   HCT 43.5 12/10/2021 1555   HCT 40.1 12/30/2016 1045   PLT 264 03/07/2022 1436   PLT 268 12/10/2021 1555   MCV 90.3 03/07/2022 1436   MCV 90 12/10/2021 1555   MCV 84.7 12/30/2016 1045   MCH 30.8 03/07/2022 1436   MCHC 34.2 03/07/2022 1436   RDW 13.2 03/07/2022 1436   RDW 11.8 12/10/2021 1555   RDW 14.5 12/30/2016 1045   LYMPHSABS 1.6 03/07/2022 1436   LYMPHSABS 1.8 06/28/2019 1536   LYMPHSABS 1.8 12/30/2016 1045   MONOABS 0.4 03/07/2022 1436   MONOABS 0.5 12/30/2016 1045   EOSABS 0.0 03/07/2022 1436   EOSABS 0.1 06/28/2019 1536   BASOSABS 0.0 03/07/2022 1436   BASOSABS 0.0 06/28/2019 1536   BASOSABS 0.0 12/30/2016 1045    CMP     Component Value Date/Time   NA 143 03/07/2022 1436   NA 143 12/10/2021 1555   NA 141 12/30/2016 1046   K 3.7 03/07/2022 1436   K 4.1 12/30/2016 1046   CL 102 03/07/2022 1436   CL 104 07/30/2012 0955   CO2 33 (H) 03/07/2022 1436   CO2 30 (H) 12/30/2016 1046   GLUCOSE 112 (H) 03/07/2022 1436   GLUCOSE 136 12/30/2016 1046   GLUCOSE 138 (H) 07/30/2012 0955   BUN 12 03/07/2022 1436   BUN 9 12/10/2021 1555    BUN 12.6 12/30/2016 1046   CREATININE 0.58 03/07/2022 1436   CREATININE 0.8 12/30/2016 1046   CALCIUM 10.8 (H) 03/07/2022 1436   CALCIUM 9.1 12/30/2016 1046   PROT 7.0 03/07/2022 1436   PROT 6.5 10/08/2021 1444   PROT 7.5 12/30/2016 1046   ALBUMIN 4.3 03/07/2022 1436   ALBUMIN 4.4 10/08/2021 1444   ALBUMIN 3.9 12/30/2016 1046   AST 24 03/07/2022 1436   AST 20 12/30/2016 1046   ALT 16 03/07/2022 1436   ALT 14 12/30/2016 1046   ALKPHOS 93 03/07/2022 1436   ALKPHOS 102 12/30/2016 1046   BILITOT 0.5 03/07/2022 1436   BILITOT 0.40 12/30/2016 1046   GFRNONAA >60 03/07/2022 1436   GFRAA 105 03/28/2020 1501      ASSESSMENT and THERAPY PLAN:   Malignant neoplasm of central portion of right breast in female, estrogen receptor negative ([place]) [name] has a history of bilateral breast cancer 1 triple positive the other triple negative.  She has no clinical or radiographic signs of breast cancer recurrence.  Her breast exam today is benign.  We discussed [facility] testing today.  She is going to read about it.  I am not sure how helpful it  would be considering she is 10 years out from her initial diagnosis but nonetheless I did give her that information.  Her calcium level is elevated at 10.8.  I gave her a handout on hypercalcemia.  We will add on a PTH level today and if it is normal we will consider ordering a bone scan.  Recommended continued healthy diet, exercise, and follow-up with her primary care provider to stay up-to-date on her health maintenance.  We will see [name] [name] back in 1 year for continued long-term follow-up or sooner if needed.    All questions were answered. The patient knows to call the clinic with any problems, questions or concerns. We can certainly see the patient much sooner if necessary.  Total encounter time:30 minutes*in face-to-face visit time, chart review, lab review, care coordination, order entry, and documentation of the encounter time.    Wilber Bihari, NP 03/07/22 3:50 PM Medical Oncology and Hematology [facility] [place], [place] [address] Tel. [phone]    Fax. [phone]  *Total Encounter Time as defined by the Centers for Medicare and Medicaid Services includes, in addition to the face-to-face time of a patient visit (documented in the note above) non-face-to-face time: obtaining and reviewing outside history, ordering and reviewing medications, tests or procedures, care coordination (communications with other health care professionals or caregivers) and documentation in the medical record.

## 2022-03-08 LAB — PARATHYROID HORMONE, INTACT (NO CA): PTH: 42 pg/mL (ref 15–65)

## 2022-03-10 ENCOUNTER — Other Ambulatory Visit: Payer: Self-pay | Admitting: Adult Health

## 2022-03-10 ENCOUNTER — Telehealth: Payer: Self-pay

## 2022-03-10 DIAGNOSIS — C50111 Malignant neoplasm of central portion of right female breast: Secondary | ICD-10-CM

## 2022-03-10 NOTE — Progress Notes (Signed)
[name] Unterreiner has a history of breast cancer, and routine labs have shown persistent hypercalcemia.  A PTH was negative.  We are ordering a bone scan to rule out any metastatic lesions in the bone.    Wilber Bihari, NP 03/10/22 3:07 PM Medical Oncology and Hematology [facility] [place], [place] [address] Tel. [phone]    Fax. [phone]

## 2022-03-10 NOTE — Telephone Encounter (Signed)
Called and given below message. She verbalized understanding and appreciated the call. She is agreeable to the bone scan.

## 2022-03-10 NOTE — Telephone Encounter (Signed)
-----  Message from Gardenia Phlegm, NP sent at 03/10/2022  8:50 AM EST ----- PTH is normal, meaning that the parathyroid is not causing increases in her calcium level.  I recommend obtaining a bone scan like we discussed at her appointment if she is agreeable.   ----- Message ----- From: Interface, [facility] [address] Sent: 03/08/2022  10:36 AM EST To: Gardenia Phlegm, NP

## 2022-03-12 DIAGNOSIS — K229 Disease of esophagus, unspecified: Secondary | ICD-10-CM | POA: Diagnosis not present

## 2022-03-12 DIAGNOSIS — K219 Gastro-esophageal reflux disease without esophagitis: Secondary | ICD-10-CM | POA: Diagnosis not present

## 2022-03-12 DIAGNOSIS — K2289 Other specified disease of esophagus: Secondary | ICD-10-CM | POA: Diagnosis not present

## 2022-03-12 DIAGNOSIS — Z9884 Bariatric surgery status: Secondary | ICD-10-CM | POA: Diagnosis not present

## 2022-03-12 DIAGNOSIS — K209 Esophagitis, unspecified without bleeding: Secondary | ICD-10-CM | POA: Diagnosis not present

## 2022-03-31 ENCOUNTER — Other Ambulatory Visit: Payer: Self-pay

## 2022-04-01 ENCOUNTER — Other Ambulatory Visit: Payer: Self-pay

## 2022-04-01 MED ORDER — CRESTOR 5 MG PO TABS: ORAL_TABLET | ORAL | 1 refills | Status: DC

## 2022-04-02 DIAGNOSIS — H40013 Open angle with borderline findings, low risk, bilateral: Secondary | ICD-10-CM | POA: Diagnosis not present

## 2022-04-02 DIAGNOSIS — E119 Type 2 diabetes mellitus without complications: Secondary | ICD-10-CM | POA: Diagnosis not present

## 2022-04-09 ENCOUNTER — Other Ambulatory Visit: Payer: Self-pay | Admitting: Internal Medicine

## 2022-04-10 ENCOUNTER — Ambulatory Visit (INDEPENDENT_AMBULATORY_CARE_PROVIDER_SITE_OTHER): Payer: Medicare Other | Admitting: Internal Medicine

## 2022-04-10 ENCOUNTER — Encounter: Payer: Self-pay | Admitting: Internal Medicine

## 2022-04-10 ENCOUNTER — Ambulatory Visit (INDEPENDENT_AMBULATORY_CARE_PROVIDER_SITE_OTHER): Payer: Medicare Other

## 2022-04-10 VITALS — BP 122/80 | HR 80 | Temp 98.0°F | Ht 64.2 in | Wt 169.6 lb

## 2022-04-10 VITALS — BP 122/80 | Temp 98.0°F | Ht 64.0 in | Wt 169.0 lb

## 2022-04-10 DIAGNOSIS — E785 Hyperlipidemia, unspecified: Secondary | ICD-10-CM | POA: Diagnosis not present

## 2022-04-10 DIAGNOSIS — E1169 Type 2 diabetes mellitus with other specified complication: Secondary | ICD-10-CM | POA: Diagnosis not present

## 2022-04-10 DIAGNOSIS — Z6829 Body mass index (BMI) 29.0-29.9, adult: Secondary | ICD-10-CM

## 2022-04-10 DIAGNOSIS — E7800 Pure hypercholesterolemia, unspecified: Secondary | ICD-10-CM

## 2022-04-10 DIAGNOSIS — E559 Vitamin D deficiency, unspecified: Secondary | ICD-10-CM

## 2022-04-10 DIAGNOSIS — Z0000 Encounter for general adult medical examination without abnormal findings: Secondary | ICD-10-CM

## 2022-04-10 MED ORDER — CRESTOR 5 MG PO TABS: ORAL_TABLET | ORAL | 2 refills | Status: DC

## 2022-04-10 NOTE — Patient Instructions (Addendum)
[name] [name] , Thank you for taking time to come for your Medicare Wellness Visit. I appreciate your ongoing commitment to your health goals. Please review the following plan we discussed and let me know if I can assist you in the future.   These are the goals we discussed:  Goals      Exercise 3x per week (30 min per time)     Wants to exercise 5-6 days per week     Patient Stated     03/09/2019, wants to stay healthy     Patient Stated     03/14/2020, eat healthy and exercise, wants to get off diabetic medication     Patient Stated     03/21/2021, lose a little weight and get off rybelsus, stay healthy     Patient Stated     04/10/2022, wants to lose weight        This is a list of the screening recommended for you and due dates:  Health Maintenance  Topic Date Due   Eye exam for diabetics  08/03/2020   COVID-19 Vaccine (7 - 2023-24 season) 01/21/2022   Colon Cancer Screening  04/29/2022   Hemoglobin A1C  08/06/2022   Yearly kidney health urinalysis for diabetes  10/09/2022   Complete foot exam   10/09/2022   Yearly kidney function blood test for diabetes  03/08/2023   Medicare Annual Wellness Visit  04/11/2023   DTaP/Tdap/Td vaccine (2 - Td or Tdap) 03/22/2031   Pneumonia Vaccine  Completed   Flu Shot  Completed   DEXA scan (bone density measurement)  Completed   Hepatitis C Screening: USPSTF Recommendation to screen - Ages 23-79 yo.  Completed   Zoster (Shingles) Vaccine  Completed   HPV Vaccine  Aged Out    Advanced directives: Please bring a copy of your POA (Power of [place]) and/or Living Will to your next appointment.   Conditions/risks identified: none  Next appointment: Follow up in one year for your annual wellness visit    Preventive Care 65 Years and Older, Female Preventive care refers to lifestyle choices and visits with your health care provider that can promote health and wellness. What does preventive care include? A yearly physical exam. This is  also called an annual well check. Dental exams once or twice a year. Routine eye exams. Ask your health care provider how often you should have your eyes checked. Personal lifestyle choices, including: Daily care of your teeth and gums. Regular physical activity. Eating a healthy diet. Avoiding tobacco and drug use. Limiting alcohol use. Practicing safe sex. Taking low-dose aspirin every day. Taking vitamin and mineral supplements as recommended by your health care provider. What happens during an annual well check? The services and screenings done by your health care provider during your annual well check will depend on your age, overall health, lifestyle risk factors, and family history of disease. Counseling  Your health care provider may ask you questions about your: Alcohol use. Tobacco use. Drug use. Emotional well-being. Home and relationship well-being. Sexual activity. Eating habits. History of falls. Memory and ability to understand (cognition). Work and work Statistician. Reproductive health. Screening  You may have the following tests or measurements: Height, weight, and BMI. Blood pressure. Lipid and cholesterol levels. These may be checked every 5 years, or more frequently if you are over 81 years old. Skin check. Lung cancer screening. You may have this screening every year starting at age 28 if you have a 30-pack-year history of smoking  and currently smoke or have quit within the past 15 years. Fecal occult blood test (FOBT) of the stool. You may have this test every year starting at age 60. Flexible sigmoidoscopy or colonoscopy. You may have a sigmoidoscopy every 5 years or a colonoscopy every 10 years starting at age 58. Hepatitis C blood test. Hepatitis B blood test. Sexually transmitted disease (STD) testing. Diabetes screening. This is done by checking your blood sugar (glucose) after you have not eaten for a while (fasting). You may have this done every 1-3  years. Bone density scan. This is done to screen for osteoporosis. You may have this done starting at age 77. Mammogram. This may be done every 1-2 years. Talk to your health care provider about how often you should have regular mammograms. Talk with your health care provider about your test results, treatment options, and if necessary, the need for more tests. Vaccines  Your health care provider may recommend certain vaccines, such as: Influenza vaccine. This is recommended every year. Tetanus, diphtheria, and acellular pertussis (Tdap, Td) vaccine. You may need a Td booster every 10 years. Zoster vaccine. You may need this after age 50. Pneumococcal 13-valent conjugate (PCV13) vaccine. One dose is recommended after age 51. Pneumococcal polysaccharide (PPSV23) vaccine. One dose is recommended after age 68. Talk to your health care provider about which screenings and vaccines you need and how often you need them. This information is not intended to replace advice given to you by your health care provider. Make sure you discuss any questions you have with your health care provider. Document Released: 03/02/2015 Document Revised: 10/24/2015 Document Reviewed: 12/05/2014 Elsevier Interactive Patient Education  2017 [place] Prevention in the Home Falls can cause injuries. They can happen to people of all ages. There are many things you can do to make your home safe and to help prevent falls. What can I do on the outside of my home? Regularly fix the edges of walkways and driveways and fix any cracks. Remove anything that might make you trip as you walk through a door, such as a raised step or threshold. Trim any bushes or trees on the path to your home. Use bright outdoor lighting. Clear any walking paths of anything that might make someone trip, such as rocks or tools. Regularly check to see if handrails are loose or broken. Make sure that both sides of any steps have  handrails. Any raised decks and porches should have guardrails on the edges. Have any leaves, snow, or ice cleared regularly. Use sand or salt on walking paths during winter. Clean up any spills in your garage right away. This includes oil or grease spills. What can I do in the bathroom? Use night lights. Install grab bars by the toilet and in the tub and shower. Do not use towel bars as grab bars. Use non-skid mats or decals in the tub or shower. If you need to sit down in the shower, use a plastic, non-slip stool. Keep the floor dry. Clean up any water that spills on the floor as soon as it happens. Remove soap buildup in the tub or shower regularly. Attach bath mats securely with double-sided non-slip rug tape. Do not have throw rugs and other things on the floor that can make you trip. What can I do in the bedroom? Use night lights. Make sure that you have a light by your bed that is easy to reach. Do not use any sheets or blankets that are  too big for your bed. They should not hang down onto the floor. Have a firm chair that has side arms. You can use this for support while you get dressed. Do not have throw rugs and other things on the floor that can make you trip. What can I do in the kitchen? Clean up any spills right away. Avoid walking on wet floors. Keep items that you use a lot in easy-to-reach places. If you need to reach something above you, use a strong step stool that has a grab bar. Keep electrical cords out of the way. Do not use floor polish or wax that makes floors slippery. If you must use wax, use non-skid floor wax. Do not have throw rugs and other things on the floor that can make you trip. What can I do with my stairs? Do not leave any items on the stairs. Make sure that there are handrails on both sides of the stairs and use them. Fix handrails that are broken or loose. Make sure that handrails are as long as the stairways. Check any carpeting to make sure  that it is firmly attached to the stairs. Fix any carpet that is loose or worn. Avoid having throw rugs at the top or bottom of the stairs. If you do have throw rugs, attach them to the floor with carpet tape. Make sure that you have a light switch at the top of the stairs and the bottom of the stairs. If you do not have them, ask someone to add them for you. What else can I do to help prevent falls? Wear shoes that: Do not have high heels. Have rubber bottoms. Are comfortable and fit you well. Are closed at the toe. Do not wear sandals. If you use a stepladder: Make sure that it is fully opened. Do not climb a closed stepladder. Make sure that both sides of the stepladder are locked into place. Ask someone to hold it for you, if possible. Clearly mark and make sure that you can see: Any grab bars or handrails. First and last steps. Where the edge of each step is. Use tools that help you move around (mobility aids) if they are needed. These include: Canes. Walkers. Scooters. Crutches. Turn on the lights when you go into a dark area. Replace any light bulbs as soon as they burn out. Set up your furniture so you have a clear path. Avoid moving your furniture around. If any of your floors are uneven, fix them. If there are any pets around you, be aware of where they are. Review your medicines with your doctor. Some medicines can make you feel dizzy. This can increase your chance of falling. Ask your doctor what other things that you can do to help prevent falls. This information is not intended to replace advice given to you by your health care provider. Make sure you discuss any questions you have with your health care provider. Document Released: 11/30/2008 Document Revised: 07/12/2015 Document Reviewed: 03/10/2014 Elsevier Interactive Patient Education  2017 [employer].

## 2022-04-10 NOTE — Progress Notes (Signed)
I,[name],acting as a scribe for [name], MD.,have documented all relevant documentation on the behalf of [name], MD,as directed by  [name], MD while in the presence of [name], MD.    Subjective:     Patient ID: [name] [name] , female    DOB: 1950/06/15 , 72 y.o.   MRN: [id number]   Chief Complaint  Patient presents with   Diabetes   Hyperlipidemia    HPI  She presents today for diabetes & chol check. She reports compliance with meds. Patient would like to resume Rybelsus. This was previously stopped due to GI bleed/ulcer - GI specialist felt this was caused by this medication.   Pt also seen by [facility] for [facility].                      Diabetes She presents for her follow-up diabetic visit. She has type 2 diabetes mellitus. There are no hypoglycemic associated symptoms. There are no diabetic associated symptoms. Pertinent negatives for diabetes include no polydipsia, no polyphagia and no polyuria. There are no diabetic complications. Risk factors for coronary artery disease include diabetes mellitus, dyslipidemia, hypertension and post-menopausal. She participates in exercise daily. An ACE inhibitor/angiotensin II receptor blocker is not being taken.  Hyperlipidemia This is a chronic problem. The current episode started more than 1 year ago. The problem is controlled. Exacerbating diseases include diabetes. Current antihyperlipidemic treatment includes statins. The current treatment provides moderate improvement of lipids. There are no compliance problems.  Risk factors for coronary artery disease include post-menopausal, diabetes mellitus and dyslipidemia.     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 experiencing on occasional , h/o og gastroparesis    Heart murmur    Hiatal hernia    Osteopenia    Pneumonia    treated as an outpt.    Sinus problem    Sleep apnea    sleep minimal concern- " along time ago"     Family History  Problem Relation Age of Onset   Heart attack Father    Diabetes Father    Heart attack Brother    Diabetes Brother    Diabetes Sister    Breast cancer Sister 42   Diabetes Mother    Breast cancer Mother 29   Parkinson's disease Sister    Heart attack Brother      Current Outpatient Medications:    Ascorbic Acid (VITAMIN C) 500 MG tablet, Take 500 mg by mouth daily., Disp: , Rfl:    Cholecalciferol (VITAMIN D PO), Take 1 tablet by mouth daily., Disp: , Rfl:    CRESTOR 5 MG tablet, Take 1 tablet by mouth once daily, Disp: 90 tablet, Rfl: 2   dexlansoprazole (DEXILANT) 60 MG capsule, Take 1 capsule by mouth every morning., Disp: , Rfl:    ELDERBERRY PO, Take by mouth., Disp: , Rfl:    glucose blood (CONTOUR NEXT TEST) test strip, Use as instructed TO TEST BLOOD SUGARS ONCE DAILY. DX:  E11.9, Disp: 100 each, Rfl: 3   KRILL OIL PO, Take 1 capsule by mouth daily. , Disp: , Rfl:    Magnesium Oxide 250 MG TABS, Take 400 mg by mouth daily. , Disp: , Rfl:    Multiple Vitamin (MULTIVITAMIN PO), Take 1 tablet by mouth daily., Disp: , Rfl:    Probiotic Product (PROBIOTIC PO), Take 1 capsule by mouth daily., Disp: , Rfl:    sitaGLIPtin (JANUVIA) 100 MG tablet, Take 1 tablet (100 mg total) by mouth daily., Disp: 90 tablet, Rfl: 1   Allergies  Allergen Reactions   Darvon Hives and Itching   Penicillins Hives and Itching    Has patient had a PCN reaction causing immediate rash, facial/tongue/throat swelling, SOB or lightheadedness with hypotension: No Has patient had a PCN reaction causing severe rash  involving mucus membranes or skin necrosis: No Has patient had a PCN reaction that required hospitalization: No Has patient had a PCN reaction occurring within the last 10 years: No If all of the above answers are "NO", then may proceed with Cephalosporin use.    Clindamycin/Lincomycin Other (See Comments)    vomiting     Review of Systems  Constitutional: Negative.   Respiratory: Negative.    Cardiovascular: Negative.   Endocrine: Negative for polydipsia, polyphagia and polyuria.  Neurological: Negative.   Psychiatric/Behavioral: Negative.       Today's Vitals   04/10/22 1001  BP: 122/80  Temp: 98 F (36.7 C)  SpO2: 96%  Weight: 169 lb (76.7 kg)  Height: '5\' 4"'$  (1.626 m)   Body mass index is 29.01 kg/m.  Wt Readings from Last 3 Encounters:  04/10/22 169 lb (76.7 kg)  04/10/22 169 lb 9.6 oz (76.9 kg)  03/07/22 167 lb 11.2 oz (76.1 kg)    Objective:  Physical Exam Vitals and nursing note reviewed.  Constitutional:      Appearance: Normal appearance.  HENT:     Head: Normocephalic and atraumatic.     Nose:     Comments: Masked     Mouth/Throat:     Comments: Masked Eyes:     Extraocular Movements: Extraocular movements intact.  Cardiovascular:     Rate and Rhythm: Normal rate and regular rhythm.     Heart sounds: Normal heart sounds.  Pulmonary:     Effort: Pulmonary effort is normal.     Breath sounds: Normal breath sounds.  Musculoskeletal:     Cervical back: Normal range of motion.  Skin:    General: Skin is warm.  Neurological:     General: No focal deficit present.     Mental Status: She is alert.  Psychiatric:        Mood and Affect: Mood normal.        Behavior: Behavior normal.       Assessment And Plan:     1. Type 2 diabetes mellitus with hyperlipidemia ([place]) Comments: Wishes to resume Rybelsus, will start '3mg'$  daily x 1 month, then increase to '6mg'$  prior to increasing to '7mg'$ . However, it may be more beneficial to implement a slower taper,  perhaps every 60 days instead of a monthly taper. I will check labs as below and then make further recommendations.  - Hemoglobin A1c - Lipid panel - TSH  2. Hypercalcemia Comments: I will check labs as below. Advised to stop calcium supplementation. - Vitamin D (25 hydroxy) - Phosphorus  3. Body mass index (BMI) of 29.0-29.9 in adult Comments: She is encouraged to aim for at least  150 minutes of exercise/week.     Patient was given opportunity to ask questions. Patient verbalized understanding of the plan and was able to repeat key elements of the plan. All questions were answered to their satisfaction.   I, [name], MD, have reviewed all documentation for this visit. The documentation on 04/10/22 for the exam, diagnosis, procedures, and orders are all accurate and complete.   IF YOU HAVE BEEN REFERRED TO A SPECIALIST, IT MAY TAKE 1-2 WEEKS TO SCHEDULE/PROCESS THE REFERRAL. IF YOU HAVE NOT HEARD FROM US/SPECIALIST IN TWO WEEKS, PLEASE GIVE [place] A CALL AT [phone].   THE PATIENT IS ENCOURAGED TO PRACTICE SOCIAL DISTANCING DUE TO THE COVID-19 PANDEMIC.

## 2022-04-10 NOTE — Progress Notes (Signed)
Subjective:   [name] [name] is a 72 y.o. female who presents for Medicare Annual (Subsequent) preventive examination.  Review of Systems     Cardiac Risk Factors include: advanced age (>18mn, >>9women);diabetes mellitus;dyslipidemia     Objective:    Today's Vitals   04/10/22 0943  BP: 122/80  Pulse: 80  Temp: 98 F (36.7 C)  TempSrc: Oral  SpO2: 96%  Weight: 169 lb 9.6 oz (76.9 kg)  Height: 5' 4.2" (1.631 m)   Body mass index is 28.93 kg/m.     04/10/2022    9:53 AM 03/21/2021   10:14 AM 03/14/2020   11:52 AM 03/09/2019   11:36 AM 03/03/2018    9:41 AM 03/02/2017    4:11 PM 02/25/2017    1:33 PM  Advanced Directives  Does Patient Have a Medical Advance Directive? Yes Yes Yes Yes Yes Yes Yes  Type of AParamedicof APageLiving will HFayettevilleLiving will HScot[place]Living will HNassau Village-RatliffLiving will HColumbusLiving will Living will;Healthcare Power of Attorney   Does patient want to make changes to medical advance directive?     No - Patient declined No - Patient declined No - Patient declined  Copy of HAvonin Chart? No - copy requested No - copy requested No - copy requested No - copy requested No - copy requested No - copy requested     Current Medications (verified) Outpatient Encounter Medications as of 04/10/2022  Medication Sig   Ascorbic Acid (VITAMIN C) 500 MG tablet Take 500 mg by mouth daily.   Cholecalciferol (VITAMIN D PO) Take 1 tablet by mouth daily.   CRESTOR 5 MG tablet Take 1 tablet by mouth once daily   dexlansoprazole (DEXILANT) 60 MG capsule Take 1 capsule by mouth every morning.   ELDERBERRY PO Take by mouth.   glucose blood (CONTOUR NEXT TEST) test strip Use as instructed TO TEST BLOOD SUGARS ONCE DAILY. DX: E11.9   KRILL OIL PO Take 1 capsule by mouth daily.    Magnesium Oxide 250 MG TABS Take 400 mg by mouth daily.     Multiple Vitamin (MULTIVITAMIN PO) Take 1 tablet by mouth daily.   Probiotic Product (PROBIOTIC PO) Take 1 capsule by mouth daily.   sitaGLIPtin (JANUVIA) 100 MG tablet Take 1 tablet (100 mg total) by mouth daily.   No facility-administered encounter medications on file as of 04/10/2022.    Allergies (verified) Darvon, Penicillins, and Clindamycin/lincomycin   History: Past Medical History:  Diagnosis Date   Arthritis    back & hip- R   Breast cancer ([facility] 04/15/12   left-biopsy   Breast cancer, right breast ([facility] 05/14/12   right mastectomy   Bronchitis    Cancer ([facility]    breast   Chemotherapy-induced cardiomyopathy ([place]    [place] with unknown [place]VEF ([place] 03/09/2013   Diabetes mellitus    Diabetes mellitus without complication (HCC)    GERD (gastroesophageal reflux disease)    pt. experiencing on occasional , h/o og gastroparesis    Heart murmur    Hiatal hernia    Osteopenia    Pneumonia    treated as an outpt.    Sinus problem    Sleep apnea    sleep minimal concern- " along time ago"   Past Surgical History:  Procedure Laterality Date   AXILLARY SENTINEL NODE BIOPSY Left 05/14/2012   Procedure: AXILLARY SENTINEL NODE BIOPSY;  Surgeon:  Adin Hector, MD;  Location: [place];  Service: General;  Laterality: Left;   BREAST RECONSTRUCTION WITH PLACEMENT OF TISSUE EXPANDER AND FLEX HD (ACELLULAR HYDRATED DERMIS) Bilateral 05/14/2012   Procedure: BREAST RECONSTRUCTION WITH PLACEMENT OF TISSUE EXPANDER AND FLEX HD (ACELLULAR HYDRATED DERMIS);  Surgeon: Crissie Reese, MD;  Location: [place];  Service: Plastics;  Laterality: Bilateral;   BUNIONECTOMY Bilateral    CESAREAN SECTION  1981, 1983   FOOT SURGERY     GASTRIC BYPASS     1st surgery- at [facility], then an emergent surgery here  [facility]- for perforation of stomach & then a surgery for debridement    PORT-A-CATH REMOVAL Right 09/12/2013   Procedure: REMOVAL PORT-A-CATH;  Surgeon: Adin Hector, MD;  Location: [place];  Service: General;  Laterality: Right;   PORTACATH PLACEMENT N/A 05/14/2012   Procedure: INSERTION PORT-A-CATH;  Surgeon: Adin Hector, MD;  Location: [place];  Service: General;  Laterality: N/A;   SHOULDER SURGERY Left 06/2019   TONSILLECTOMY     TOTAL HIP ARTHROPLASTY Right 03/02/2017   TOTAL HIP ARTHROPLASTY Right 03/02/2017   Procedure: RIGHT TOTAL HIP ARTHROPLASTY ANTERIOR APPROACH;  Surgeon: Frederik Pear, MD;  Location: [place];  Service: Orthopedics;  Laterality: Right;   TOTAL MASTECTOMY Bilateral 05/14/2012   Procedure: TOTAL MASTECTOMY;  Surgeon: Adin Hector, MD;  Location: [place];  Service: General;  Laterality: Bilateral;   tummy tuck  1999   WOUND DEBRIDEMENT  01/14/2011   Procedure: DEBRIDEMENT ABDOMINAL WOUND;  Surgeon: Adin Hector, MD;  Location: [place];  Service: General;  Laterality: N/A;  wound debridement and debridement on the abdomen   Family History  Problem Relation Age of Onset   Heart attack Father    Diabetes Father    Heart attack Brother    Diabetes Brother    Diabetes Sister    Breast cancer Sister 26   Diabetes Mother    Breast cancer Mother 38   Parkinson's disease Sister    Heart attack Brother    Social History   Socioeconomic History   Marital status: Married    Spouse name: Joe   Number of children: 2   Years of education: 18   Highest education level: Not on file  Occupational History   Occupation: Therapist, 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, [place]   Occupation: retired  Tobacco Use   Smoking status: Former    Types: Cigarettes    Quit date: 02/25/2006    Years since quitting: 16.1   Smokeless tobacco: Never  Vaping Use   Vaping Use: Never used  Substance and Sexual Activity   Alcohol use: Yes    Alcohol/week: 2.0 - 3.0 standard drinks of alcohol    Types: 2 - 3 Glasses of wine per week    Comment: no liquor   Drug use: No   Sexual activity: Yes    Partners: Male    Birth control/protection:  Post-menopausal  Other Topics Concern   Not on file  Social History Narrative   Not on file   Social Determinants of Health   Financial Resource Strain: Low Risk  (04/10/2022)   Overall Financial Resource Strain (CARDIA)    Difficulty of Paying Living Expenses: Not hard at all  Food Insecurity: No Food Insecurity (04/10/2022)   Hunger Vital Sign    Worried About Running Out of Food in the Last Year: Never true    Ran Out of Food  in the Last Year: Never true  Transportation Needs: No Transportation Needs (04/10/2022)   PRAPARE - Hydrologist (Medical): No    Lack of Transportation (Non-Medical): No  Physical Activity: Sufficiently Active (04/10/2022)   Exercise Vital Sign    Days of Exercise per Week: 7 days    Minutes of Exercise per Session: 30 min  Stress: No Stress Concern Present (04/10/2022)   [place]    Feeling of Stress : Not at all  Social Connections: Not on file    Tobacco Counseling Counseling given: Not Answered   Clinical Intake:  Pre-visit preparation completed: Yes  Pain : No/denies pain     Nutritional Status: BMI 25 -29 Overweight Nutritional Risks: None Diabetes: Yes  How often do you need to have someone help you when you read instructions, pamphlets, or other written materials from your doctor or pharmacy?: 1 - Never  Diabetic? Yes Nutrition Risk Assessment:  Has the patient had any N/V/D within the last 2 months?  No  Does the patient have any non-healing wounds?  No  Has the patient had any unintentional weight loss or weight gain?  No   Diabetes:  Is the patient diabetic?  Yes  If diabetic, was a CBG obtained today?  No  Did the patient bring in their glucometer from home?  No  How often do you monitor your CBG's? Checks if feeling funny.   Financial Strains and Diabetes Management:  Are you having any financial strains with the device, your  supplies or your medication? No .  Does the patient want to be seen by Chronic Care Management for management of their diabetes?  No  Would the patient like to be referred to a Nutritionist or for Diabetic Management?  No   Diabetic Exams:  Diabetic Eye Exam: Completed 04/02/2022 Diabetic Foot Exam: Completed 03/21/2021  Interpreter Needed?: No  Information entered by :: NAllen LPN   Activities of Daily Living    04/10/2022    9:54 AM  In your present state of health, do you have any difficulty performing the following activities:  Hearing? 0  Vision? 0  Difficulty concentrating or making decisions? 0  Walking or climbing stairs? 0  Dressing or bathing? 0  Doing errands, shopping? 0  Preparing Food and eating ? N  Using the Toilet? N  In the past six months, have you accidently leaked urine? N  Do you have problems with loss of bowel control? N  Managing your Medications? N  Managing your Finances? N  Housekeeping or managing your Housekeeping? N    Patient Care Team: [name], MD as PCP - General (Internal Medicine) Crawford Givens, MD as Consulting Physician (Obstetrics and Gynecology) Fanny Skates, MD as Consulting Physician (General Surgery) Crissie Reese, MD as Consulting Physician (Plastic Surgery) Belva Crome, MD (Inactive) as Consulting Physician (Cardiology) Marylynn Pearson, MD as Consulting Physician (Ophthalmology)  Indicate any recent Medical Services you may have received from other than [facility] providers in the past year (date may be approximate).     Assessment:   This is a routine wellness examination for [name].  Hearing/Vision screen Vision Screening - Comments:: Regular eye exams, Dr. Venetia Maxon  Dietary issues and exercise activities discussed: Current Exercise Habits: Home exercise routine, Type of exercise: treadmill;strength training/weights, Time (Minutes): 30, Frequency (Times/Week): 7, Weekly Exercise (Minutes/Week): 210   Goals  Addressed  This Visit's Progress    Patient Stated       04/10/2022, wants to lose weight       Depression Screen    04/10/2022    9:54 AM 03/21/2021   10:15 AM 03/14/2020   11:53 AM 03/14/2020   11:35 AM 03/09/2019   11:37 AM 03/03/2018    9:42 AM  PHQ 2/9 Scores  PHQ - 2 Score 0 0 0 0 0 0  PHQ- 9 Score     0 0    Fall Risk    04/10/2022    9:54 AM 03/21/2021   10:14 AM 03/14/2020   11:53 AM 03/14/2020   11:35 AM 03/09/2019   11:37 AM  Fall Risk   Falls in the past year? 0 0 0 0 0  Number falls in past yr: 0      Injury with Fall? 0      Risk for fall due to : Medication side effect Medication side effect Medication side effect  Medication side effect  Follow up Falls prevention discussed;Education provided;Falls evaluation completed Falls evaluation completed;Education provided;Falls prevention discussed Falls evaluation completed;Education provided;Falls prevention discussed  Falls evaluation completed;Education provided;Falls prevention discussed    FALL RISK PREVENTION PERTAINING TO THE HOME:  Any stairs in or around the home? Yes  If so, are there any without handrails? No  Home free of loose throw rugs in walkways, pet beds, electrical cords, etc? No  Adequate lighting in your home to reduce risk of falls? Yes   ASSISTIVE DEVICES UTILIZED TO PREVENT FALLS:  Life alert? No  Use of a cane, walker or w/c? No  Grab bars in the bathroom? Yes  Shower chair or bench in shower? Yes  Elevated toilet seat or a handicapped toilet? Yes   TIMED UP AND GO:  Was the test performed? Yes .  Length of time to ambulate 10 feet: 5 sec.   Gait steady and fast without use of assistive device  Cognitive Function:        04/10/2022    9:55 AM 03/21/2021   10:16 AM 03/14/2020   11:54 AM 03/09/2019   11:39 AM 03/03/2018    9:45 AM  6CIT Screen  What Year? 0 points 0 points 0 points 0 points 0 points  What month? 0 points 0 points 0 points 0 points 0 points  What time?  0 points 0 points 0 points 0 points 0 points  Count back from 20 0 points 0 points 0 points 0 points 0 points  Months in reverse 0 points 0 points 0 points 0 points 0 points  Repeat phrase 0 points 0 points 2 points 0 points 0 points  Total Score 0 points 0 points 2 points 0 points 0 points    Immunizations Immunization History  Administered Date(s) Administered   Fluad Quad(high Dose 65+) 11/18/2019, 11/19/2020   Influenza, High Dose Seasonal PF 10/06/2018   Influenza,inj,Quad PF,6+ Mos 11/05/2012   Influenza-Unspecified 12/18/2017, 10/06/2018   PFIZER(Purple Top)SARS-COV-2 Vaccination 03/17/2019, 03/31/2019, 10/08/2019, 05/24/2020   Pfizer Covid-19 Vaccine Bivalent Booster 66yr & up 11/14/2020   Pneumococcal Conjugate-13 10/06/2018   Pneumococcal Polysaccharide-23 03/03/2017   Tdap 03/21/2021   Zoster Recombinat (Shingrix) 03/24/2017, 07/01/2017    TDAP status: Up to date  Flu Vaccine status: Up to date  Pneumococcal vaccine status: Up to date  Covid-19 vaccine status: Completed vaccines  Qualifies for Shingles Vaccine? Yes   Zostavax completed Yes   Shingrix Completed?: Yes  Screening Tests Health Maintenance  Topic Date Due   OPHTHALMOLOGY EXAM  08/03/2020   COVID-19 Vaccine (6 - 2023-24 season) 10/18/2021   Medicare Annual Wellness (AWV)  03/21/2022   INFLUENZA VACCINE  05/18/2022 (Originally 09/17/2021)   COLONOSCOPY (Pts 45-43yr Insurance coverage will need to be confirmed)  04/29/2022   HEMOGLOBIN A1C  08/06/2022   Diabetic kidney evaluation - Urine ACR  10/09/2022   FOOT EXAM  10/09/2022   Diabetic kidney evaluation - eGFR measurement  03/08/2023   DTaP/Tdap/Td (2 - Td or Tdap) 03/22/2031   Pneumonia Vaccine 72 Years old  Completed   DEXA SCAN  Completed   Hepatitis C Screening  Completed   Zoster Vaccines- Shingrix  Completed   HPV VACCINES  Aged Out    Health Maintenance  Health Maintenance Due  Topic Date Due   OPHTHALMOLOGY EXAM  08/03/2020    COVID-19 Vaccine (6 - 2023-24 season) 10/18/2021   Medicare Annual Wellness (AWV)  03/21/2022    Colorectal cancer screening: Type of screening: Colonoscopy. Completed 04/28/2012. Repeat every 10 years  Mammogram status: No longer required due to double mastectomy.  Bone Density status: Completed 03/01/2013.   Lung Cancer Screening: (Low Dose CT Chest recommended if Age 72-80years, 30 pack-year currently smoking OR have quit w/in 15years.) does not qualify.   Lung Cancer Screening Referral: no  Additional Screening:  Hepatitis C Screening: does qualify; Completed 03/14/2020  Vision Screening: Recommended annual ophthalmology exams for early detection of glaucoma and other disorders of the eye. Is the patient up to date with their annual eye exam?  Yes  Who is the provider or what is the name of the office in which the patient attends annual eye exams? Dr. WVenetia MaxonIf pt is not established with a provider, would they like to be referred to a provider to establish care? No .   Dental Screening: Recommended annual dental exams for proper oral hygiene  Community Resource Referral / Chronic Care Management: CRR required this visit?  No   CCM required this visit?  No      Plan:     I have personally reviewed and noted the following in the patient's chart:   Medical and social history Use of alcohol, tobacco or illicit drugs  Current medications and supplements including opioid prescriptions. Patient is not currently taking opioid prescriptions. Functional ability and status Nutritional status Physical activity Advanced directives List of other physicians Hospitalizations, surgeries, and ER visits in previous 12 months Vitals Screenings to include cognitive, depression, and falls Referrals and appointments  In addition, I have reviewed and discussed with patient certain preventive protocols, quality metrics, and best practice recommendations. A written personalized care plan  for preventive services as well as general preventive health recommendations were provided to patient.     NKellie Simmering LPN   [id number]  Nurse Notes: none

## 2022-04-10 NOTE — Patient Instructions (Addendum)
Start Rybelsus, 50m daily x four weeks, then increase to TWO 357mtablets daily  Type 2 Diabetes Mellitus, Diagnosis, Adult Type 2 diabetes (type 2 diabetes mellitus) is a long-term (chronic) disease. It may happen when there is one or both of these problems: The pancreas does not make enough insulin. The body does not react in a normal way to insulin that it makes. Insulin lets sugars go into cells in your body. If you have type 2 diabetes, sugars cannot get into your cells. Sugars build up in the blood. This causes high blood sugar. What are the causes? The exact cause of this condition is not known. What increases the risk? Having type 2 diabetes in your family. Being overweight or very overweight. Not being active. Your body not reacting in a normal way to the insulin it makes. Having higher than normal blood sugar over time. Having a type of diabetes when you were pregnant. Having a condition that causes small fluid-filled sacs on your ovaries. What are the signs or symptoms? At first, you may have no symptoms. You will get symptoms slowly. They may include: More thirst than normal. More hunger than normal. Needing to pee more than normal. Losing weight without trying. Feeling tired. Feeling weak. Seeing things blurry. Dark patches on your skin. How is this treated? This condition may be treated by a diabetes expert. You may need to: Follow an eating plan made by a food expert (dietitian). Get regular exercise. Find ways to deal with stress. Check blood sugar as often as told. Take medicines. Your doctor will set treatment goals for you. Your blood sugar should be at these levels: Before meals: 80-130 mg/dL (4.4-7.2 mmol/L). After meals: below 180 mg/dL (10 mmol/L). Over the last 2-3 months: less than 7%. Follow these instructions at home: Medicines Take your diabetes medicines or insulin every day. Take medicines as told to help you prevent other problems caused by this  condition. You may need: Aspirin. Medicine to lower cholesterol. Medicine to control blood pressure. Questions to ask your doctor Should I meet with a diabetes educator? What medicines do I need, and when should I take them? What will I need to treat my condition at home? When should I check my blood sugar? Where can I find a support group? Who can I call if I have questions? When is my next doctor visit? General instructions Take over-the-counter and prescription medicines only as told by your doctor. Keep all follow-up visits. Where to find more information For help and guidance and more information about diabetes, please go to: American Diabetes Association (ADA): www.diabetes.org American Association of Diabetes Care and Education Specialists (ADCES): www.diabeteseducator.org International Diabetes Federation (IDF): wwMemberVerification.caontact a doctor if: Your blood sugar is at or above 240 mg/dL (13.3 mmol/L) for 2 days in a row. You have been sick for 2 days or more, and you are not getting better. You have had a fever for 2 days or more, and you are not getting better. You have any of these problems for more than 6 hours: You cannot eat or drink. You feel like you may vomit. You vomit. You have watery poop (diarrhea). Get help right away if: Your blood sugar is lower than 54 mg/dL (3 mmol/L). You feel mixed up (confused). You have trouble thinking clearly. You have trouble breathing. You have medium or large ketone levels in your pee. These symptoms may be an emergency. Get help right away. Call your local emergency services (911 in the [place]  Do not wait to see if the symptoms will go away. Do not drive yourself to the hospital. Summary Type 2 diabetes is a long-term disease. Your pancreas may not make enough insulin, or your body may not react in a normal way to insulin that it makes. This condition is treated with an eating plan, lifestyle changes, and medicines. Your  doctor will set treatment goals for you. These will help you keep your blood sugar in a healthy range. Keep all follow-up visits. This information is not intended to replace advice given to you by your health care provider. Make sure you discuss any questions you have with your health care provider. Document Revised: 04/30/2020 Document Reviewed: 04/30/2020 Elsevier Patient Education  [place].

## 2022-04-11 LAB — LIPID PANEL
Chol/HDL Ratio: 2.1 ratio (ref 0.0–4.4)
Cholesterol, Total: 157 mg/dL (ref 100–199)
HDL: 76 mg/dL (ref >39)
LDL Chol Calc (NIH): 70 mg/dL (ref 0–99)
Triglycerides: 49 mg/dL (ref 0–149)
VLDL Cholesterol Cal: 11 mg/dL (ref 5–40)

## 2022-04-11 LAB — TSH: TSH: 0.583 u[IU]/mL (ref 0.450–4.500)

## 2022-04-11 LAB — VITAMIN D 25 HYDROXY (VIT D DEFICIENCY, FRACTURES): Vit D, 25-Hydroxy: 53.3 ng/mL (ref 30.0–100.0)

## 2022-04-11 LAB — PHOSPHORUS: Phosphorus: 2.8 mg/dL — ABNORMAL LOW (ref 3.0–4.3)

## 2022-04-11 LAB — HEMOGLOBIN A1C
Est. average glucose Bld gHb Est-mCnc: 146 mg/dL
Hgb A1c MFr Bld: 6.7 % — ABNORMAL HIGH (ref 4.8–5.6)

## 2022-04-14 ENCOUNTER — Other Ambulatory Visit: Payer: Self-pay | Admitting: Internal Medicine

## 2022-04-16 ENCOUNTER — Other Ambulatory Visit: Payer: Self-pay

## 2022-04-16 MED ORDER — CONTOUR NEXT TEST VI STRP: ORAL_STRIP | 0 refills | Status: DC

## 2022-05-15 ENCOUNTER — Other Ambulatory Visit: Payer: Self-pay

## 2022-05-15 DIAGNOSIS — E1100 Type 2 diabetes mellitus with hyperosmolarity without nonketotic hyperglycemic-hyperosmolar coma (NKHHC): Secondary | ICD-10-CM

## 2022-05-15 MED ORDER — CONTOUR NEXT TEST VI STRP: ORAL_STRIP | 0 refills | Status: AC

## 2022-06-18 ENCOUNTER — Ambulatory Visit (INDEPENDENT_AMBULATORY_CARE_PROVIDER_SITE_OTHER): Payer: Medicare Other | Admitting: Cardiovascular Disease

## 2022-06-18 ENCOUNTER — Encounter (HOSPITAL_BASED_OUTPATIENT_CLINIC_OR_DEPARTMENT_OTHER): Payer: Self-pay | Admitting: Cardiovascular Disease

## 2022-06-18 VITALS — BP 120/72 | HR 82 | Ht 64.0 in | Wt 170.9 lb

## 2022-06-18 DIAGNOSIS — I427 Cardiomyopathy due to drug and external agent: Secondary | ICD-10-CM | POA: Diagnosis not present

## 2022-06-18 DIAGNOSIS — T451X5A Adverse effect of antineoplastic and immunosuppressive drugs, initial encounter: Secondary | ICD-10-CM

## 2022-06-18 DIAGNOSIS — T451X5D Adverse effect of antineoplastic and immunosuppressive drugs, subsequent encounter: Secondary | ICD-10-CM

## 2022-06-18 DIAGNOSIS — E782 Mixed hyperlipidemia: Secondary | ICD-10-CM | POA: Diagnosis not present

## 2022-06-18 NOTE — Assessment & Plan Note (Signed)
LVEF has recovered.  She is exercising regularly and has no heart failure symptoms.  LVEF 65%.

## 2022-06-18 NOTE — Assessment & Plan Note (Signed)
Lipids are very well-controlled on rosuvastatin. Continue with regular exercise.  She was encouraged to incorporate some light weight training.

## 2022-06-18 NOTE — Progress Notes (Signed)
Cardiology Office Note:    Date:  06/18/2022   ID:  [name] [name], DOB 06/12/1950, MRN [id number]  PCP:  Dorothyann Peng, MD   [facility] Providers Cardiologist:  Chilton Si, MD     Referring MD: Dorothyann Peng, MD   No chief complaint on file.   History of Present Illness:    [name] [name] is a 72 y.o. female with a hx of bilateral breast cancer HER-2 +& ER+, treated with bilateral mastectomy/reconstruction (2014), adjuvant therapy ( Doxorubicin/Cyclophosphamise--> Abraxine x 12 + Trastuzumab (completed 12/03/2012), Trastuzumab (Herceptin) duration 7//2014 thru 06/29/2013 (DC due to LV dysfunction), and finally letrozole (DC 2019) here to reestablish care.  While being treated for her breast cancer she developed shortness of breath.  Echo at the time revealed LVEF 50%.  Herceptin was discontinued and LVEF improved to 65%.  Since that time she has not had any active cardiac issues.  She last saw Dr. Katrinka Blazing in 2021 and was doing well.  Today, she is overall doing well. She stays active by walking daily with no pain or limitations. She usually does 45 minutes on the treadmill, but walks outside when the weather is nice. She walked about 4 miles this morning. She previously did weight training but stopped before having surgery in her shoulder. She occasionally has palpitations, usually in the evenings when she is sitting. These episodes tend to last about 5-10 minutes. She was started on Rybelsus. She denies any chest pain, shortness of breath, or peripheral edema. No lightheadedness, headaches, syncope, orthopnea, or PND.  Past Medical History:  Diagnosis Date   Arthritis    back & hip- R   Breast cancer (HCC) 04/15/12   left-biopsy   Breast cancer, right breast (HCC) 05/14/12   right mastectomy   Bronchitis    Cancer (HCC)    breast   Chemotherapy-induced cardiomyopathy (HCC)    CHF with unknown LVEF (HCC) 03/09/2013   Diabetes mellitus    Diabetes mellitus without  complication (HCC)    GERD (gastroesophageal reflux disease)    pt. experiencing on occasional , h/o og gastroparesis    Heart murmur    Hiatal hernia    Osteopenia    Pneumonia    treated as an outpt.    Sinus problem    Sleep apnea    sleep minimal concern- " along time ago"    Past Surgical History:  Procedure Laterality Date   AXILLARY SENTINEL NODE BIOPSY Left 05/14/2012   Procedure: AXILLARY SENTINEL NODE BIOPSY;  Surgeon: Ernestene Mention, MD;  Location: [facility] OR;  Service: General;  Laterality: Left;   BREAST RECONSTRUCTION WITH PLACEMENT OF TISSUE EXPANDER AND FLEX HD (ACELLULAR HYDRATED DERMIS) Bilateral 05/14/2012   Procedure: BREAST RECONSTRUCTION WITH PLACEMENT OF TISSUE EXPANDER AND FLEX HD (ACELLULAR HYDRATED DERMIS);  Surgeon: Etter Sjogren, MD;  Location: [facility] OR;  Service: Plastics;  Laterality: Bilateral;   BUNIONECTOMY Bilateral    CESAREAN SECTION  1981, 1983   FOOT SURGERY     GASTRIC BYPASS     1st surgery- at [facility], then an emergent surgery here  [facility]- for perforation of stomach & then a surgery for debridement    PORT-A-CATH REMOVAL Right 09/12/2013   Procedure: REMOVAL PORT-A-CATH;  Surgeon: Ernestene Mention, MD;  Location: [facility];  Service: General;  Laterality: Right;   PORTACATH PLACEMENT N/A 05/14/2012   Procedure: INSERTION PORT-A-CATH;  Surgeon: Ernestene Mention, MD;  Location: [facility] OR;  Service: General;  Laterality: N/A;  SHOULDER SURGERY Left 06/2019   TONSILLECTOMY     TOTAL HIP ARTHROPLASTY Right 03/02/2017   TOTAL HIP ARTHROPLASTY Right 03/02/2017   Procedure: RIGHT TOTAL HIP ARTHROPLASTY ANTERIOR APPROACH;  Surgeon: Gean Birchwood, MD;  Location: [facility] OR;  Service: Orthopedics;  Laterality: Right;   TOTAL MASTECTOMY Bilateral 05/14/2012   Procedure: TOTAL MASTECTOMY;  Surgeon: Ernestene Mention, MD;  Location: [facility] OR;  Service: General;  Laterality: Bilateral;   tummy tuck  1999   WOUND DEBRIDEMENT  01/14/2011   Procedure: DEBRIDEMENT  ABDOMINAL WOUND;  Surgeon: Ernestene Mention, MD;  Location: [facility];  Service: General;  Laterality: N/A;  wound debridement and debridement on the abdomen    Current Medications: Current Meds  Medication Sig   Ascorbic Acid (VITAMIN C) 500 MG tablet Take 500 mg by mouth daily.   Cholecalciferol (VITAMIN D PO) Take 1 tablet by mouth daily.   CRESTOR 5 MG tablet Take 1 tablet by mouth once daily   dexlansoprazole (DEXILANT) 60 MG capsule Take 1 capsule by mouth every morning.   ELDERBERRY PO Take by mouth.   glucose blood (CONTOUR NEXT TEST) test strip USE 1 STRIP TO CHECK GLUCOSE ONCE DAILY   KRILL OIL PO Take 1 capsule by mouth daily.    Magnesium Oxide 250 MG TABS Take 400 mg by mouth daily.    Multiple Vitamin (MULTIVITAMIN PO) Take 1 tablet by mouth daily.   Probiotic Product (PROBIOTIC PO) Take 1 capsule by mouth daily.   Semaglutide (RYBELSUS) 7 MG TABS Take 7 mg by mouth every morning.     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 [place]   Occupation: retired  Tobacco Use   Smoking status: Former    Types: Cigarettes    Quit date: 02/25/2006    Years since quitting: 16.3   Smokeless tobacco: Never  Vaping Use   Vaping Use: Never used  Substance and Sexual Activity   Alcohol use: Yes    Alcohol/week: 2.0 - 3.0 standard drinks of alcohol    Types: 2 - 3 Glasses of wine per week    Comment: no liquor   Drug use: No   Sexual activity: Yes    Partners: Male    Birth control/protection: Post-menopausal  Other Topics Concern   Not on file  Social History Narrative   Not on file   Social Determinants of Health   Financial Resource Strain: Low Risk  (04/10/2022)   Overall  Financial Resource Strain (CARDIA)    Difficulty of Paying Living Expenses: Not hard at all  Food Insecurity: No Food Insecurity (04/10/2022)   Hunger Vital Sign    Worried About Running Out of Food in the Last Year: Never true    Ran Out of Food in the Last Year: Never true  Transportation Needs: No Transportation Needs (04/10/2022)   PRAPARE - Transportation    Lack of Transportation (Medical): No    Lack of Transportation (Non-Medical): No  Physical Activity: Sufficiently Active (04/10/2022)   Exercise Vital Sign    Days of Exercise per Week: 7 days    Minutes of Exercise per Session: 30 min  Stress: No Stress Concern Present (04/10/2022)  [employer] of Occupational Health - Occupational Stress Questionnaire    Feeling of Stress : Not at all  Social Connections: Not on file     Family History: The patient's family history includes Breast cancer (age of onset: 58) in her sister; Breast cancer (age of onset: 34) in her mother; Diabetes in her brother, father, mother, and sister; Heart attack in her brother, brother, and father; Parkinson's disease in her sister.  ROS:   Please see the history of present illness.    (+) palpitations (occasionally) All other systems reviewed and are negative.  EKGs/Labs/Other Studies Reviewed:    The following studies were reviewed today:  EKG:    06/18/2022: Sinus rhythm. Rate 82 bpm. Low voltage  Recent Labs: 03/07/2022: ALT 16; BUN 12; Creatinine 0.58; Hemoglobin 14.9; Platelet Count 264; Potassium 3.7; Sodium 143 04/10/2022: TSH 0.583   Recent Lipid Panel    Component Value Date/Time   CHOL 157 04/10/2022 1036   TRIG 49 04/10/2022 1036   HDL 76 04/10/2022 1036   CHOLHDL 2.1 04/10/2022 1036   LDLCALC 70 04/10/2022 1036    Physical Exam:    Wt Readings from Last 3 Encounters:  06/18/22 170 lb 14.4 oz (77.5 kg)  04/10/22 169 lb (76.7 kg)  04/10/22 169 lb 9.6 oz (76.9 kg)     VS:  BP 120/72 (BP Location: Left Arm, Patient  Position: Sitting, Cuff Size: Large)   Pulse 82   Ht 5\' 4"  (1.626 m)   Wt 170 lb 14.4 oz (77.5 kg)   BMI 29.33 kg/m  , BMI Body mass index is 29.33 kg/m. GENERAL:  Well appearing HEENT: Pupils equal round and reactive, fundi not visualized, oral mucosa unremarkable NECK:  No jugular venous distention, waveform within normal limits, carotid upstroke brisk and symmetric, no bruits, no thyromegaly LUNGS:  Clear to auscultation bilaterally HEART:  RRR.  PMI not displaced or sustained,S1 and S2 within normal limits, no S3, no S4, no clicks, no rubs, no murmurs ABD:  Flat, positive bowel sounds normal in frequency in pitch, no bruits, no rebound, no guarding, no midline pulsatile mass, no hepatomegaly, no splenomegaly EXT:  2 plus pulses throughout, no edema, no cyanosis no clubbing SKIN:  No rashes no nodules NEURO:  Cranial nerves II through XII grossly intact, motor grossly intact throughout PSYCH:  Cognitively intact, oriented to person place and time   ASSESSMENT:    1. Chemotherapy induced cardiomyopathy (HCC)   2. Mixed hyperlipidemia    PLAN:    Chemotherapy induced cardiomyopathy (HCC) LVEF has recovered.  She is exercising regularly and has no heart failure symptoms.  LVEF 65%.   Hyperlipidemia Lipids are very well-controlled on rosuvastatin. Continue with regular exercise.  She was encouraged to incorporate some light weight training.         Disposition: FU with Eaton Folmar C. Duke Salvia, MD, [facility] in 1 year   Medication Adjustments/Labs and Tests Ordered: Current medicines are reviewed at length with the patient today.  Concerns regarding medicines are outlined above.   No orders of the defined types were placed in this encounter.  No orders of the defined types were placed in this encounter.  Patient Instructions  Medication Instructions:  Your physician recommends that you continue on your current medications as directed. Please refer to the Current Medication list  given to you today.  *If you need a refill on your cardiac medications before your next appointment, please call your pharmacy*  Lab Work: NONE  Testing/Procedures: NONE  Follow-Up: At [facility], you and your health needs are our priority.  As part of our continuing mission to provide you with exceptional heart care, we have created designated Provider Care Teams.  These Care Teams include your primary Cardiologist (physician) and Advanced Practice Providers (APPs -  Physician Assistants and Nurse Practitioners) who all work together to provide you with the care you need, when you need it.  We recommend signing up for the patient portal called "MyChart".  Sign up information is provided on this After Visit Summary.  MyChart is used to connect with patients for Virtual Visits (Telemedicine).  Patients are able to view lab/test results, encounter notes, upcoming appointments, etc.  Non-urgent messages can be sent to your provider as well.   To learn more about what you can do with MyChart, go to [email or web address].    Your next appointment:   12 month(s)  The format for your next appointment:   In Person  Provider:   Chilton Si, MD       I,Rachel Rivera,acting as a scribe for Chilton Si, MD.,have documented all relevant documentation on the behalf of Chilton Si, MD,as directed by  Chilton Si, MD while in the presence of Chilton Si, MD.  I, Sandee Bernath C. Duke Salvia, MD have reviewed all documentation for this visit.  The documentation of the exam, diagnosis, procedures, and orders on 06/18/2022 are all accurate and complete.   Signed, Chilton Si, MD  06/18/2022 4:04 PM    [facility]

## 2022-06-18 NOTE — Patient Instructions (Signed)
Medication Instructions:  Your physician recommends that you continue on your current medications as directed. Please refer to the Current Medication list given to you today.  *If you need a refill on your cardiac medications before your next appointment, please call your pharmacy*  Lab Work: NONE  Testing/Procedures: NONE  Follow-Up: At [facility], you and your health needs are our priority.  As part of our continuing mission to provide you with exceptional heart care, we have created designated Provider Care Teams.  These Care Teams include your primary Cardiologist (physician) and Advanced Practice Providers (APPs -  Physician Assistants and Nurse Practitioners) who all work together to provide you with the care you need, when you need it.  We recommend signing up for the patient portal called "MyChart".  Sign up information is provided on this After Visit Summary.  MyChart is used to connect with patients for Virtual Visits (Telemedicine).  Patients are able to view lab/test results, encounter notes, upcoming appointments, etc.  Non-urgent messages can be sent to your provider as well.   To learn more about what you can do with MyChart, go to [email or web address].    Your next appointment:   12 month(s)  The format for your next appointment:   In Person  Provider:   Tiffany Logan, MD     

## 2022-06-24 ENCOUNTER — Ambulatory Visit (INDEPENDENT_AMBULATORY_CARE_PROVIDER_SITE_OTHER): Payer: Medicare Other | Admitting: Internal Medicine

## 2022-06-24 ENCOUNTER — Ambulatory Visit: Payer: Medicare Other | Admitting: Internal Medicine

## 2022-06-24 ENCOUNTER — Encounter: Payer: Self-pay | Admitting: Internal Medicine

## 2022-06-24 VITALS — BP 132/80 | HR 74 | Temp 97.6°F | Ht 64.0 in | Wt 170.2 lb

## 2022-06-24 DIAGNOSIS — E663 Overweight: Secondary | ICD-10-CM | POA: Diagnosis not present

## 2022-06-24 DIAGNOSIS — E785 Hyperlipidemia, unspecified: Secondary | ICD-10-CM

## 2022-06-24 DIAGNOSIS — Z6829 Body mass index (BMI) 29.0-29.9, adult: Secondary | ICD-10-CM

## 2022-06-24 DIAGNOSIS — Z1211 Encounter for screening for malignant neoplasm of colon: Secondary | ICD-10-CM

## 2022-06-24 DIAGNOSIS — E1169 Type 2 diabetes mellitus with other specified complication: Secondary | ICD-10-CM

## 2022-06-24 NOTE — Progress Notes (Unsigned)
I,[name],acting as a scribe for [name], MD.,have documented all relevant documentation on the behalf of [name], MD,as directed by  [name], MD while in the presence of [name], MD.    Subjective:     Patient ID: [name] [name] , female    DOB: May 25, 1950 , 72 y.o.   MRN: [id number]   Chief Complaint  Patient presents with   Diabetes   Hyperlipidemia    HPI  She presents today for diabetes & chol check. She reports compliance with meds. She reports no specific questions or concerns.  Letter sent to Dr Harlon Flor for eye exam.  Patient also notified needing colonoscopy.                    Diabetes She presents for her follow-up diabetic visit. She has type 2 diabetes mellitus. There are no hypoglycemic associated symptoms. There are no diabetic associated symptoms. Pertinent negatives for diabetes include no polydipsia, no polyphagia and no polyuria. There are no diabetic complications. Risk factors for coronary artery disease include diabetes mellitus, dyslipidemia, hypertension and post-menopausal. She participates in exercise daily. An ACE inhibitor/angiotensin II receptor blocker is not being taken.  Hyperlipidemia This is a chronic problem. The current episode started more than 1 year ago. The problem is controlled. Exacerbating diseases include diabetes. Current antihyperlipidemic treatment includes statins. The current treatment provides moderate improvement of lipids. There are no compliance problems.  Risk factors for coronary artery disease include post-menopausal, diabetes mellitus and dyslipidemia.     Past Medical History:  Diagnosis Date   Arthritis    back & hip- R   Breast cancer (HCC) 04/15/12   left-biopsy   Breast cancer, right breast (HCC) 05/14/12   right mastectomy   Bronchitis    Cancer (HCC)    breast   Chemotherapy-induced cardiomyopathy (HCC)    CHF with unknown LVEF (HCC) 03/09/2013   Diabetes mellitus     Diabetes mellitus without complication (HCC)    GERD (gastroesophageal reflux disease)    pt. experiencing on occasional , h/o og gastroparesis    Heart murmur    Hiatal hernia    Osteopenia    Pneumonia    treated as an outpt.    Sinus problem    Sleep apnea    sleep minimal concern- " along time ago"     Family History  Problem Relation Age of Onset   Heart attack Father    Diabetes Father    Heart attack Brother    Diabetes Brother    Diabetes Sister    Breast cancer Sister 47   Diabetes Mother    Breast cancer Mother 62   Parkinson's disease Sister    Heart attack Brother      Current Outpatient Medications:    Ascorbic Acid (VITAMIN C) 500 MG tablet, Take 500 mg by mouth daily., Disp: , Rfl:    Cholecalciferol (VITAMIN D PO), Take 1 tablet by mouth daily., Disp: , Rfl:    CRESTOR 5 MG tablet, Take 1 tablet by mouth once daily, Disp: 90 tablet, Rfl: 2   dexlansoprazole (DEXILANT) 60 MG capsule, Take 1 capsule by mouth every morning., Disp: , Rfl:    ELDERBERRY PO, Take by mouth., Disp: , Rfl:    glucose blood (CONTOUR NEXT TEST) test strip, USE 1 STRIP TO CHECK GLUCOSE ONCE DAILY, Disp: 100 each, Rfl: 0   KRILL OIL PO, Take 1 capsule by mouth daily. ,  Disp: , Rfl:    Magnesium Oxide 250 MG TABS, Take 400 mg by mouth daily. , Disp: , Rfl:    Multiple Vitamin (MULTIVITAMIN PO), Take 1 tablet by mouth daily., Disp: , Rfl:    Probiotic Product (PROBIOTIC PO), Take 1 capsule by mouth daily., Disp: , Rfl:    Semaglutide (RYBELSUS) 7 MG TABS, Take 7 mg by mouth every morning., Disp: , Rfl:    Allergies  Allergen Reactions   Darvon Hives and Itching   Penicillins Hives and Itching    Has patient had a PCN reaction causing immediate rash, facial/tongue/throat swelling, SOB or lightheadedness with hypotension: No Has patient had a PCN reaction causing severe rash involving mucus membranes or skin necrosis: No Has patient had a PCN reaction that required hospitalization:  No Has patient had a PCN reaction occurring within the last 10 years: No If all of the above answers are "NO", then may proceed with Cephalosporin use.    Clindamycin/Lincomycin Other (See Comments)    vomiting     Review of Systems  Constitutional: Negative.   Respiratory: Negative.    Cardiovascular: Negative.   Gastrointestinal: Negative.   Endocrine: Negative for polydipsia, polyphagia and polyuria.  Skin: Negative.   Psychiatric/Behavioral: Negative.       Today's Vitals   06/24/22 1119  BP: 132/80  Pulse: 74  Temp: 97.6 F (36.4 C)  SpO2: 98%  Weight: 170 lb 3.2 oz (77.2 kg)  Height: 5\' 4"  (1.626 m)   Body mass index is 29.21 kg/m.  Wt Readings from Last 3 Encounters:  06/24/22 170 lb 3.2 oz (77.2 kg)  06/18/22 170 lb 14.4 oz (77.5 kg)  04/10/22 169 lb (76.7 kg)    Objective:  Physical Exam Vitals and nursing note reviewed.  Constitutional:      Appearance: Normal appearance.  HENT:     Head: Normocephalic and atraumatic.  Eyes:     Extraocular Movements: Extraocular movements intact.  Cardiovascular:     Rate and Rhythm: Normal rate and regular rhythm.     Heart sounds: Normal heart sounds.  Pulmonary:     Effort: Pulmonary effort is normal.     Breath sounds: Normal breath sounds.  Musculoskeletal:     Cervical back: Normal range of motion.  Skin:    General: Skin is warm.  Neurological:     General: No focal deficit present.     Mental Status: She is alert.  Psychiatric:        Mood and Affect: Mood normal.        Behavior: Behavior normal.       Assessment And Plan:     1. Type 2 diabetes mellitus with hyperlipidemia (HCC) - Hemoglobin A1c - BMP8+EGFR  2. Hypercalcemia - Phosphorus - BMP8+EGFR  3. Screening for colon cancer - Ambulatory referral to Gastroenterology   Patient was given opportunity to ask questions. Patient verbalized understanding of the plan and was able to repeat key elements of the plan. All questions were  answered to their satisfaction.   I, [name], MD, have reviewed all documentation for this visit. The documentation on 06/24/22 for the exam, diagnosis, procedures, and orders are all accurate and complete.   IF YOU HAVE BEEN REFERRED TO A SPECIALIST, IT MAY TAKE 1-2 WEEKS TO SCHEDULE/PROCESS THE REFERRAL. IF YOU HAVE NOT HEARD FROM US/SPECIALIST IN TWO WEEKS, PLEASE GIVE [place] A CALL AT [phone].   THE PATIENT IS ENCOURAGED TO PRACTICE SOCIAL DISTANCING DUE TO  THE COVID-19 PANDEMIC.

## 2022-06-24 NOTE — Patient Instructions (Signed)

## 2022-06-25 LAB — BMP8+EGFR
BUN/Creatinine Ratio: 17 (ref 12–28)
BUN: 10 mg/dL (ref 8–27)
CO2: 24 mmol/L (ref 20–29)
Calcium: 10.7 mg/dL — ABNORMAL HIGH (ref 8.7–10.3)
Chloride: 102 mmol/L (ref 96–106)
Creatinine, Ser: 0.6 mg/dL (ref 0.57–1.00)
Glucose: 140 mg/dL — ABNORMAL HIGH (ref 70–99)
Potassium: 4.6 mmol/L (ref 3.5–5.2)
Sodium: 144 mmol/L (ref 134–144)
eGFR: 95 mL/min/{1.73_m2} (ref >59)

## 2022-06-25 LAB — HEMOGLOBIN A1C
Est. average glucose Bld gHb Est-mCnc: 160 mg/dL
Hgb A1c MFr Bld: 7.2 % — ABNORMAL HIGH (ref 4.8–5.6)

## 2022-06-25 LAB — PHOSPHORUS: Phosphorus: 2.9 mg/dL — ABNORMAL LOW (ref 3.0–4.3)

## 2022-07-10 ENCOUNTER — Ambulatory Visit: Payer: Medicare Other | Admitting: Internal Medicine

## 2022-09-03 DIAGNOSIS — Z1211 Encounter for screening for malignant neoplasm of colon: Secondary | ICD-10-CM | POA: Diagnosis not present

## 2022-09-03 DIAGNOSIS — D123 Benign neoplasm of transverse colon: Secondary | ICD-10-CM | POA: Diagnosis not present

## 2022-09-03 DIAGNOSIS — K635 Polyp of colon: Secondary | ICD-10-CM | POA: Diagnosis not present

## 2022-09-03 DIAGNOSIS — Z86010 Personal history of colonic polyps: Secondary | ICD-10-CM | POA: Diagnosis not present

## 2022-09-03 DIAGNOSIS — K648 Other hemorrhoids: Secondary | ICD-10-CM | POA: Diagnosis not present

## 2022-09-03 DIAGNOSIS — D125 Benign neoplasm of sigmoid colon: Secondary | ICD-10-CM | POA: Diagnosis not present

## 2022-09-03 DIAGNOSIS — K6389 Other specified diseases of intestine: Secondary | ICD-10-CM | POA: Diagnosis not present

## 2022-10-28 ENCOUNTER — Ambulatory Visit (INDEPENDENT_AMBULATORY_CARE_PROVIDER_SITE_OTHER): Payer: Medicare Other | Admitting: Internal Medicine

## 2022-10-28 ENCOUNTER — Ambulatory Visit: Payer: Medicare Other | Admitting: Internal Medicine

## 2022-10-28 ENCOUNTER — Encounter: Payer: Self-pay | Admitting: Internal Medicine

## 2022-10-28 VITALS — BP 118/76 | HR 75 | Temp 98.3°F | Ht 64.0 in | Wt 168.4 lb

## 2022-10-28 DIAGNOSIS — E1169 Type 2 diabetes mellitus with other specified complication: Secondary | ICD-10-CM

## 2022-10-28 DIAGNOSIS — E785 Hyperlipidemia, unspecified: Secondary | ICD-10-CM | POA: Diagnosis not present

## 2022-10-28 DIAGNOSIS — Z6828 Body mass index (BMI) 28.0-28.9, adult: Secondary | ICD-10-CM | POA: Diagnosis not present

## 2022-10-28 DIAGNOSIS — E663 Overweight: Secondary | ICD-10-CM | POA: Diagnosis not present

## 2022-10-28 NOTE — Assessment & Plan Note (Signed)
Chronic, well controlled with Rybelsus 7mg  daily. I will check labs as below. I will consider alternative dosing (M-F and skip weekends) to decrease risk of adverse GI side effects. LDL goal is less than 70 - she is tolerant of BRAND Crestor, intolerant of rosuvastatin. She will f/u in Feb 2025.

## 2022-10-28 NOTE — Assessment & Plan Note (Signed)
Her BMI is acceptable for her demographic. She was congratulated on her lifestyle changes.

## 2022-10-28 NOTE — Patient Instructions (Signed)

## 2022-10-28 NOTE — Progress Notes (Signed)
I,Victoria T Deloria Lair, CMA,acting as a Neurosurgeon for [name], MD.,have documented all relevant documentation on the behalf of [name], MD,as directed by  [name], MD while in the presence of [name], MD.  Subjective:  Patient ID: [name] [name] , female    DOB: [birth year] , 72 y.o.   MRN: [id number]  Chief Complaint  Patient presents with   Diabetes   Hyperlipidemia    HPI  She presents today for diabetes & chol check. She reports compliance with meds. She reports no specific questions or concerns. She reports seeing Dr. Harlon Flor this past February for eye exam.    She has been taking Rybelsus 7mg  daily without any issues. She wishes to stay on this medication despite GI issues she had within the past year. She understands the risks, but states it is worth the benefits to control her sugars/weight.   Diabetes She presents for her follow-up diabetic visit. She has type 2 diabetes mellitus. There are no hypoglycemic associated symptoms. There are no diabetic associated symptoms. Pertinent negatives for diabetes include no polydipsia, no polyphagia and no polyuria. There are no diabetic complications. Risk factors for coronary artery disease include diabetes mellitus, dyslipidemia, hypertension and post-menopausal. She participates in exercise daily. An ACE inhibitor/angiotensin II receptor blocker is not being taken.  Hyperlipidemia This is a chronic problem. The current episode started more than 1 year ago. The problem is controlled. Exacerbating diseases include diabetes. Current antihyperlipidemic treatment includes statins. The current treatment provides moderate improvement of lipids. There are no compliance problems.  Risk factors for coronary artery disease include post-menopausal, diabetes mellitus and dyslipidemia.     Past Medical History:  Diagnosis Date   Arthritis    back & hip- R   Breast cancer (HCC) 04/15/12   left-biopsy   Breast cancer, right  breast (HCC) 05/14/12   right mastectomy   Bronchitis    Cancer (HCC)    breast   Chemotherapy-induced cardiomyopathy (HCC)    CHF with unknown LVEF (HCC) 03/09/2013   Diabetes mellitus    Diabetes mellitus without complication (HCC)    GERD (gastroesophageal reflux disease)    pt. experiencing on occasional , h/o og gastroparesis    Heart murmur    Hiatal hernia    Osteopenia    Pneumonia    treated as an outpt.    Sinus problem    Sleep apnea    sleep minimal concern- " along time ago"     Family History  Problem Relation Age of Onset   Heart attack Father    Diabetes Father    Heart attack Brother    Diabetes Brother    Diabetes Sister    Breast cancer Sister 44   Diabetes Mother    Breast cancer Mother 74   Parkinson's disease Sister    Heart attack Brother      Current Outpatient Medications:    Ascorbic Acid (VITAMIN C) 500 MG tablet, Take 500 mg by mouth daily., Disp: , Rfl:    Cholecalciferol (VITAMIN D PO), Take 1 tablet by mouth daily., Disp: , Rfl:    CRESTOR 5 MG tablet, Take 1 tablet by mouth once daily, Disp: 90 tablet, Rfl: 2   dexlansoprazole (DEXILANT) 60 MG capsule, Take 1 capsule by mouth every morning., Disp: , Rfl:    ELDERBERRY PO, Take by mouth., Disp: , Rfl:    glucose blood (CONTOUR NEXT TEST) test strip, USE 1 STRIP TO CHECK GLUCOSE ONCE  DAILY, Disp: 100 each, Rfl: 0   KRILL OIL PO, Take 1 capsule by mouth daily. , Disp: , Rfl:    Magnesium Oxide 250 MG TABS, Take 400 mg by mouth daily. , Disp: , Rfl:    Multiple Vitamin (MULTIVITAMIN PO), Take 1 tablet by mouth daily., Disp: , Rfl:    Probiotic Product (PROBIOTIC PO), Take 1 capsule by mouth daily., Disp: , Rfl:    Semaglutide (RYBELSUS) 7 MG TABS, Take 7 mg by mouth every morning., Disp: , Rfl:    Allergies  Allergen Reactions   Darvon Hives and Itching   Penicillins Hives and Itching    Has patient had a PCN reaction causing immediate rash, facial/tongue/throat swelling, SOB or  lightheadedness with hypotension: No Has patient had a PCN reaction causing severe rash involving mucus membranes or skin necrosis: No Has patient had a PCN reaction that required hospitalization: No Has patient had a PCN reaction occurring within the last 10 years: No If all of the above answers are "NO", then may proceed with Cephalosporin use.    Clindamycin/Lincomycin Other (See Comments)    vomiting     Review of Systems  Constitutional: Negative.   Respiratory: Negative.    Cardiovascular: Negative.   Gastrointestinal: Negative.   Endocrine: Negative for polydipsia, polyphagia and polyuria.  Neurological: Negative.   Psychiatric/Behavioral: Negative.       Today's Vitals   10/28/22 1203  BP: 118/76  Pulse: 75  Temp: 98.3 F (36.8 C)  SpO2: 98%  Weight: 168 lb 6.4 oz (76.4 kg)  Height: 5\' 4"  (1.626 m)   Body mass index is 28.91 kg/m.  Wt Readings from Last 3 Encounters:  10/28/22 168 lb 6.4 oz (76.4 kg)  06/24/22 170 lb 3.2 oz (77.2 kg)  06/18/22 170 lb 14.4 oz (77.5 kg)     Objective:  Physical Exam Vitals and nursing note reviewed.  Constitutional:      Appearance: Normal appearance.  HENT:     Head: Normocephalic and atraumatic.  Eyes:     Extraocular Movements: Extraocular movements intact.  Cardiovascular:     Rate and Rhythm: Normal rate and regular rhythm.     Heart sounds: Normal heart sounds.  Pulmonary:     Effort: Pulmonary effort is normal.     Breath sounds: Normal breath sounds.  Musculoskeletal:     Cervical back: Normal range of motion.  Skin:    General: Skin is warm.  Neurological:     General: No focal deficit present.     Mental Status: She is alert.  Psychiatric:        Mood and Affect: Mood normal.        Behavior: Behavior normal.         Assessment And Plan:  Type 2 diabetes mellitus with hyperlipidemia (HCC) Assessment & Plan: Chronic, well controlled with Rybelsus 7mg  daily. I will check labs as below. I will  consider alternative dosing (M-F and skip weekends) to decrease risk of adverse GI side effects. LDL goal is less than 70 - she is tolerant of BRAND Crestor, intolerant of rosuvastatin. She will f/u in Feb 2025.    Orders: -     CMP14+EGFR -     Hemoglobin A1c -     Microalbumin / creatinine urine ratio -     TSH  Hypercalcemia Assessment & Plan: I will check BMP today. I will also check SPEP today. PTH was normal earlier this year. She is reminded to stay well  hydrated.   Orders: -     Protein electrophoresis, serum  Overweight with body mass index (BMI) of 28 to 28.9 in adult Assessment & Plan: Her BMI is acceptable for her demographic. She was congratulated on her lifestyle changes.       Return if symptoms worsen or fail to improve.  Patient was given opportunity to ask questions. Patient verbalized understanding of the plan and was able to repeat key elements of the plan. All questions were answered to their satisfaction.    I, [name], MD, have reviewed all documentation for this visit. The documentation on 10/28/22 for the exam, diagnosis, procedures, and orders are all accurate and complete.   IF YOU HAVE BEEN REFERRED TO A SPECIALIST, IT MAY TAKE 1-2 WEEKS TO SCHEDULE/PROCESS THE REFERRAL. IF YOU HAVE NOT HEARD FROM US/SPECIALIST IN TWO WEEKS, PLEASE GIVE [place] A CALL AT [phone].   THE PATIENT IS ENCOURAGED TO PRACTICE SOCIAL DISTANCING DUE TO THE COVID-19 PANDEMIC.

## 2022-10-28 NOTE — Assessment & Plan Note (Signed)
I will check BMP today. I will also check SPEP today. PTH was normal earlier this year. She is reminded to stay well hydrated.

## 2022-10-30 LAB — MICROALBUMIN / CREATININE URINE RATIO
Creatinine, Urine: 19.2 mg/dL
Microalb/Creat Ratio: 54 mg/g{creat} — ABNORMAL HIGH (ref 0–29)
Microalbumin, Urine: 10.4 ug/mL

## 2022-10-30 LAB — CMP14+EGFR
ALT: 21 IU/L (ref 0–32)
AST: 24 IU/L (ref 0–40)
Albumin: 4.4 g/dL (ref 3.8–4.8)
Alkaline Phosphatase: 95 IU/L (ref 44–121)
BUN/Creatinine Ratio: 12 (ref 12–28)
BUN: 7 mg/dL — ABNORMAL LOW (ref 8–27)
Bilirubin Total: 0.4 mg/dL (ref 0.0–1.2)
CO2: 28 mmol/L (ref 20–29)
Calcium: 10.8 mg/dL — ABNORMAL HIGH (ref 8.7–10.3)
Chloride: 102 mmol/L (ref 96–106)
Creatinine, Ser: 0.57 mg/dL (ref 0.57–1.00)
Globulin, Total: 2.3 g/dL (ref 1.5–4.5)
Glucose: 99 mg/dL (ref 70–99)
Potassium: 4.9 mmol/L (ref 3.5–5.2)
Sodium: 142 mmol/L (ref 134–144)
Total Protein: 6.7 g/dL (ref 6.0–8.5)
eGFR: 96 mL/min/{1.73_m2} (ref >59)

## 2022-10-30 LAB — PROTEIN ELECTROPHORESIS, SERUM
A/G Ratio: 1.3 (ref 0.7–1.7)
Albumin ELP: 3.8 g/dL (ref 2.9–4.4)
Alpha 1: 0.3 g/dL (ref 0.0–0.4)
Alpha 2: 0.8 g/dL (ref 0.4–1.0)
Beta: 1 g/dL (ref 0.7–1.3)
Gamma Globulin: 0.8 g/dL (ref 0.4–1.8)
Globulin, Total: 2.9 g/dL (ref 2.2–3.9)

## 2022-10-30 LAB — HEMOGLOBIN A1C
Est. average glucose Bld gHb Est-mCnc: 140 mg/dL
Hgb A1c MFr Bld: 6.5 % — ABNORMAL HIGH (ref 4.8–5.6)

## 2022-10-30 LAB — TSH: TSH: 0.78 u[IU]/mL (ref 0.450–4.500)

## 2022-11-26 ENCOUNTER — Ambulatory Visit: Payer: Medicare Other | Admitting: Family Medicine

## 2022-11-26 ENCOUNTER — Encounter: Payer: Self-pay | Admitting: Family Medicine

## 2022-11-26 VITALS — BP 110/70 | HR 79 | Temp 97.9°F | Ht 64.0 in | Wt 168.0 lb

## 2022-11-26 DIAGNOSIS — J309 Allergic rhinitis, unspecified: Secondary | ICD-10-CM

## 2022-11-26 DIAGNOSIS — J069 Acute upper respiratory infection, unspecified: Secondary | ICD-10-CM

## 2022-11-26 MED ORDER — MOMETASONE FUROATE 50 MCG/ACT NA SUSP: 2.0000 | Freq: Every day | NASAL | 11 refills | Status: DC

## 2022-11-26 MED ORDER — AZITHROMYCIN 250 MG PO TABS
ORAL_TABLET | ORAL | 0 refills | Status: AC
Start: 2022-11-26 — End: 2022-12-01

## 2022-11-26 MED ORDER — BENZONATATE 100 MG PO CAPS
100.0000 mg | ORAL_CAPSULE | Freq: Three times a day (TID) | ORAL | 0 refills | Status: DC | PRN
Start: 2022-11-26 — End: 2023-03-13

## 2022-11-26 NOTE — Progress Notes (Unsigned)
I,Jameka J Llittleton, CMA,acting as a Neurosurgeon for [employer], NP.,have documented all relevant documentation on the behalf of Ellender Hose, NP,as directed by  Ellender Hose, NP while in the presence of Ellender Hose, NP.  Subjective:  Patient ID: [name] [name] , female    DOB: 19-Apr-1950 , 72 y.o.   MRN: [id number]  Chief Complaint  Patient presents with  . URI    HPI  Patient presents today for cold symptoms that started 2 weeks ago states it seem like it got better a little but now she is congested with sinus pain. She states that she is coughing up greenish brown phlegm  Patient reported she has had bronchitis and walking pneumonia and she just wants to make sure her cold doesn't turn into that.   URI  This is a recurrent problem. The current episode started 1 to 4 weeks ago. The problem has been gradually worsening. There has been no fever. Associated symptoms include congestion, coughing, rhinorrhea, sneezing and a sore throat. She has tried antihistamine and decongestant for the symptoms. The treatment provided no relief.     Past Medical History:  Diagnosis Date  . Arthritis    back & hip- R  . Breast cancer (HCC) 04/15/12   left-biopsy  . Breast cancer, right breast (HCC) 05/14/12   right mastectomy  . Bronchitis   . Cancer (HCC)    breast  . Chemotherapy-induced cardiomyopathy (HCC)   . CHF with unknown LVEF (HCC) 03/09/2013  . Diabetes mellitus   . Diabetes mellitus without complication (HCC)   . GERD (gastroesophageal reflux disease)    pt. experiencing on occasional , h/o og gastroparesis   . Heart murmur   . Hiatal hernia   . Osteopenia   . Pneumonia    treated as an outpt.   . Sinus problem   . Sleep apnea    sleep minimal concern- " along time ago"     Family History  Problem Relation Age of Onset  . Heart attack Father   . Diabetes Father   . Heart attack Brother   . Diabetes Brother   . Diabetes Sister   . Breast cancer Sister 70  . Diabetes Mother    . Breast cancer Mother 32  . Parkinson's disease Sister   . Heart attack Brother      Current Outpatient Medications:  .  Ascorbic Acid (VITAMIN C) 500 MG tablet, Take 500 mg by mouth daily., Disp: , Rfl:  .  azithromycin (ZITHROMAX) 250 MG tablet, Take 2 tablets (500 mg) on  Day 1,  followed by 1 tablet (250 mg) once daily on Days 2 through 5., Disp: 6 each, Rfl: 0 .  benzonatate (TESSALON PERLES) 100 MG capsule, Take 1 capsule (100 mg total) by mouth 3 (three) times daily as needed for cough., Disp: 30 capsule, Rfl: 0 .  Cholecalciferol (VITAMIN D PO), Take 1 tablet by mouth daily., Disp: , Rfl:  .  CRESTOR 5 MG tablet, Take 1 tablet by mouth once daily, Disp: 90 tablet, Rfl: 2 .  dexlansoprazole (DEXILANT) 60 MG capsule, Take 1 capsule by mouth every morning., Disp: , Rfl:  .  ELDERBERRY PO, Take by mouth., Disp: , Rfl:  .  glucose blood (CONTOUR NEXT TEST) test strip, USE 1 STRIP TO CHECK GLUCOSE ONCE DAILY, Disp: 100 each, Rfl: 0 .  KRILL OIL PO, Take 1 capsule by mouth daily. , Disp: , Rfl:  .  Magnesium Oxide 250 MG TABS, Take 400  mg by mouth daily. , Disp: , Rfl:  .  mometasone (NASONEX) 50 MCG/ACT nasal spray, Place 2 sprays into the nose daily., Disp: 289 each, Rfl: 11 .  Multiple Vitamin (MULTIVITAMIN PO), Take 1 tablet by mouth daily., Disp: , Rfl:  .  Probiotic Product (PROBIOTIC PO), Take 1 capsule by mouth daily., Disp: , Rfl:  .  Semaglutide (RYBELSUS) 7 MG TABS, Take 7 mg by mouth every morning., Disp: , Rfl:    Allergies  Allergen Reactions  . Darvon Hives and Itching  . Penicillins Hives and Itching    Has patient had a PCN reaction causing immediate rash, facial/tongue/throat swelling, SOB or lightheadedness with hypotension: No Has patient had a PCN reaction causing severe rash involving mucus membranes or skin necrosis: No Has patient had a PCN reaction that required hospitalization: No Has patient had a PCN reaction occurring within the last 10 years: No If all  of the above answers are "NO", then may proceed with Cephalosporin use.   . Clindamycin/Lincomycin Other (See Comments)    vomiting     Review of Systems  HENT:  Positive for congestion, rhinorrhea, sneezing and sore throat.   Respiratory:  Positive for cough.      Today's Vitals   11/26/22 1444  BP: 110/70  Pulse: 79  Temp: 97.9 F (36.6 C)  Weight: 168 lb (76.2 kg)  Height: 5\' 4"  (1.626 m)  PainSc: 0-No pain   Body mass index is 28.84 kg/m.  Wt Readings from Last 3 Encounters:  11/26/22 168 lb (76.2 kg)  10/28/22 168 lb 6.4 oz (76.4 kg)  06/24/22 170 lb 3.2 oz (77.2 kg)    The 10-year ASCVD risk score (Arnett DK, et al., 2019) is: 20.3%   Values used to calculate the score:     Age: 48 years     Sex: Female     Is Non-Hispanic African American: Yes     Diabetic: Yes     Tobacco smoker: No     Systolic Blood Pressure: 110 mmHg     Is BP treated: No     HDL Cholesterol: 76 mg/dL     Total Cholesterol: 157 mg/dL  Objective:  Physical Exam HENT:     Head: Normocephalic.     Right Ear: Tympanic membrane normal.     Left Ear: Tympanic membrane normal.     Nose: Congestion and rhinorrhea present.  Cardiovascular:     Rate and Rhythm: Normal rate and regular rhythm.  Abdominal:     General: Bowel sounds are normal.  Skin:    General: Skin is warm and dry.  Neurological:     Mental Status: She is alert.        Assessment And Plan:  URI with cough and congestion -     Azithromycin; Take 2 tablets (500 mg) on  Day 1,  followed by 1 tablet (250 mg) once daily on Days 2 through 5.  Dispense: 6 each; Refill: 0 -     Benzonatate; Take 1 capsule (100 mg total) by mouth 3 (three) times daily as needed for cough.  Dispense: 30 capsule; Refill: 0  Allergic rhinitis, unspecified seasonality, unspecified trigger -     Mometasone Furoate; Place 2 sprays into the nose daily.  Dispense: 289 each; Refill: 11    Return for Keep Sch  Appt .  Patient was given opportunity  to ask questions. Patient verbalized understanding of the plan and was able to repeat key elements of the  plan. All questions were answered to their satisfaction.    I, Ellender Hose, NP, have reviewed all documentation for this visit. The documentation on 11/26/22 for the exam, diagnosis, procedures, and orders are all accurate and complete.   IF YOU HAVE BEEN REFERRED TO A SPECIALIST, IT MAY TAKE 1-2 WEEKS TO SCHEDULE/PROCESS THE REFERRAL. IF YOU HAVE NOT HEARD FROM US/SPECIALIST IN TWO WEEKS, PLEASE GIVE [place] A CALL AT [phone].

## 2022-12-04 ENCOUNTER — Other Ambulatory Visit: Payer: Self-pay

## 2022-12-04 DIAGNOSIS — Z01411 Encounter for gynecological examination (general) (routine) with abnormal findings: Secondary | ICD-10-CM | POA: Diagnosis not present

## 2022-12-04 DIAGNOSIS — Z124 Encounter for screening for malignant neoplasm of cervix: Secondary | ICD-10-CM | POA: Diagnosis not present

## 2022-12-04 DIAGNOSIS — Z780 Asymptomatic menopausal state: Secondary | ICD-10-CM | POA: Diagnosis not present

## 2022-12-04 DIAGNOSIS — Z01419 Encounter for gynecological examination (general) (routine) without abnormal findings: Secondary | ICD-10-CM | POA: Diagnosis not present

## 2022-12-04 DIAGNOSIS — Z9013 Acquired absence of bilateral breasts and nipples: Secondary | ICD-10-CM | POA: Diagnosis not present

## 2022-12-04 MED ORDER — RYBELSUS 7 MG PO TABS: 7.0000 mg | ORAL_TABLET | Freq: Every morning | ORAL | 2 refills | Status: DC

## 2022-12-15 DIAGNOSIS — Z23 Encounter for immunization: Secondary | ICD-10-CM | POA: Diagnosis not present

## 2022-12-17 ENCOUNTER — Other Ambulatory Visit: Payer: Self-pay | Admitting: Internal Medicine

## 2022-12-22 ENCOUNTER — Other Ambulatory Visit: Payer: Medicare Other

## 2022-12-23 LAB — PTH, INTACT AND CALCIUM
Calcium: 9.9 mg/dL (ref 8.7–10.3)
PTH: 71 pg/mL — ABNORMAL HIGH (ref 15–65)

## 2023-01-28 ENCOUNTER — Telehealth: Payer: Self-pay | Admitting: Adult Health

## 2023-01-28 NOTE — Telephone Encounter (Signed)
Left Message - Informed Patient of 03/12/23 appointment being changed due to provider availability

## 2023-03-12 ENCOUNTER — Other Ambulatory Visit: Payer: Medicare Other

## 2023-03-12 ENCOUNTER — Encounter: Payer: Medicare Other | Admitting: Adult Health

## 2023-03-13 ENCOUNTER — Inpatient Hospital Stay: Payer: Medicare Other

## 2023-03-13 ENCOUNTER — Inpatient Hospital Stay: Payer: Medicare Other | Attending: Adult Health | Admitting: Adult Health

## 2023-03-13 ENCOUNTER — Encounter: Payer: Self-pay | Admitting: Adult Health

## 2023-03-13 VITALS — BP 168/75 | HR 71 | Temp 97.4°F | Resp 18 | Ht 64.0 in | Wt 169.4 lb

## 2023-03-13 DIAGNOSIS — Z171 Estrogen receptor negative status [ER-]: Secondary | ICD-10-CM

## 2023-03-13 DIAGNOSIS — Z170 Estrogen receptor positive status [ER+]: Secondary | ICD-10-CM

## 2023-03-13 DIAGNOSIS — C50812 Malignant neoplasm of overlapping sites of left female breast: Secondary | ICD-10-CM | POA: Diagnosis not present

## 2023-03-13 DIAGNOSIS — Z8249 Family history of ischemic heart disease and other diseases of the circulatory system: Secondary | ICD-10-CM | POA: Insufficient documentation

## 2023-03-13 DIAGNOSIS — Z9089 Acquired absence of other organs: Secondary | ICD-10-CM | POA: Insufficient documentation

## 2023-03-13 DIAGNOSIS — M25559 Pain in unspecified hip: Secondary | ICD-10-CM | POA: Insufficient documentation

## 2023-03-13 DIAGNOSIS — Z803 Family history of malignant neoplasm of breast: Secondary | ICD-10-CM | POA: Diagnosis not present

## 2023-03-13 DIAGNOSIS — Z853 Personal history of malignant neoplasm of breast: Secondary | ICD-10-CM | POA: Diagnosis not present

## 2023-03-13 DIAGNOSIS — E119 Type 2 diabetes mellitus without complications: Secondary | ICD-10-CM | POA: Diagnosis not present

## 2023-03-13 DIAGNOSIS — C50111 Malignant neoplasm of central portion of right female breast: Secondary | ICD-10-CM | POA: Diagnosis not present

## 2023-03-13 DIAGNOSIS — K219 Gastro-esophageal reflux disease without esophagitis: Secondary | ICD-10-CM | POA: Diagnosis not present

## 2023-03-13 DIAGNOSIS — Z79899 Other long term (current) drug therapy: Secondary | ICD-10-CM | POA: Insufficient documentation

## 2023-03-13 DIAGNOSIS — Z87891 Personal history of nicotine dependence: Secondary | ICD-10-CM | POA: Diagnosis not present

## 2023-03-13 DIAGNOSIS — R519 Headache, unspecified: Secondary | ICD-10-CM | POA: Diagnosis not present

## 2023-03-13 DIAGNOSIS — Z9884 Bariatric surgery status: Secondary | ICD-10-CM | POA: Diagnosis not present

## 2023-03-13 DIAGNOSIS — Z833 Family history of diabetes mellitus: Secondary | ICD-10-CM | POA: Insufficient documentation

## 2023-03-13 DIAGNOSIS — M8580 Other specified disorders of bone density and structure, unspecified site: Secondary | ICD-10-CM | POA: Insufficient documentation

## 2023-03-13 LAB — CMP (CANCER CENTER ONLY)
ALT: 21 U/L (ref 0–44)
AST: 25 U/L (ref 15–41)
Albumin: 4.4 g/dL (ref 3.5–5.0)
Alkaline Phosphatase: 86 U/L (ref 38–126)
Anion gap: 6 (ref 5–15)
BUN: 7 mg/dL — ABNORMAL LOW (ref 8–23)
CO2: 32 mmol/L (ref 22–32)
Calcium: 10.9 mg/dL — ABNORMAL HIGH (ref 8.9–10.3)
Chloride: 104 mmol/L (ref 98–111)
Creatinine: 0.53 mg/dL (ref 0.44–1.00)
GFR, Estimated: >60 mL/min (ref >60)
Glucose, Bld: 129 mg/dL — ABNORMAL HIGH (ref 70–99)
Potassium: 4.7 mmol/L (ref 3.5–5.1)
Sodium: 142 mmol/L (ref 135–145)
Total Bilirubin: 0.6 mg/dL (ref 0.0–1.2)
Total Protein: 7.3 g/dL (ref 6.5–8.1)

## 2023-03-13 LAB — CBC WITH DIFFERENTIAL (CANCER CENTER ONLY)
Abs Immature Granulocytes: 0.01 10*3/uL (ref 0.00–0.07)
Basophils Absolute: 0 10*3/uL (ref 0.0–0.1)
Basophils Relative: 0 %
Eosinophils Absolute: 0.1 10*3/uL (ref 0.0–0.5)
Eosinophils Relative: 2 %
HCT: 44.2 % (ref 36.0–46.0)
Hemoglobin: 14.8 g/dL (ref 12.0–15.0)
Immature Granulocytes: 0 %
Lymphocytes Relative: 34 %
Lymphs Abs: 1.6 10*3/uL (ref 0.7–4.0)
MCH: 30.5 pg (ref 26.0–34.0)
MCHC: 33.5 g/dL (ref 30.0–36.0)
MCV: 91.1 fL (ref 80.0–100.0)
Monocytes Absolute: 0.4 10*3/uL (ref 0.1–1.0)
Monocytes Relative: 9 %
Neutro Abs: 2.5 10*3/uL (ref 1.7–7.7)
Neutrophils Relative %: 55 %
Platelet Count: 264 10*3/uL (ref 150–400)
RBC: 4.85 MIL/uL (ref 3.87–5.11)
RDW: 12.3 % (ref 11.5–15.5)
WBC Count: 4.6 10*3/uL (ref 4.0–10.5)
nRBC: 0 % (ref 0.0–0.2)

## 2023-03-13 NOTE — Progress Notes (Unsigned)
Cancer Center Cancer Follow up:    [name] Peng, MD [address] [place] [address]   DIAGNOSIS: history of breast cancer  SUMMARY OF ONCOLOGIC HISTORY: BRCA negative [place] woman status post bilateral mastectomies 05/14/2012             (a) on the left, mpT1c pN0, stage IA invasive ductal carcinoma, grade 2, estrogen receptor 100% positive, progesterone receptor 11% positive, with an MIB-1 of 80%, and HER-2 amplified, the ratio being 6.50.             (b) on the right ,pT1c pN0, stage IA invasive ductal carcinoma, grade 3, triple negative, with an MIB-1 of 92%   (1) treated adjuvantly with doxorubicin and cyclophosphamide in dose dense fashion x4, completed 08/13/2012, followed by weekly Abraxane x12 given with concurrent trastuzumab, completed 12/03/2012   (2) received trastuzumab between 08/27/2012 and 06/29/2013, discontinued because of concerns regarding weakening of the heart muscle             (a) echo 07/25/2013 showed an ejection fraction of 55-60%   (3) letrozole started November 2014; completed 5 years November 2019    (4) bone density 03/01/2013 showed osteopenia with a T score of - 1.8   (5) s/p bilateral implant reconstruction with immediate expander placement (05/15/2012) with silicone implant reconstruction, 800 cc round shaped implants.   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 There were no deleterious mutations in the genetic panel tested, which included ATM, BARD1, BRCA1, BRCA2, BRIP1, CDH1, CHEK2, EPCAM, FANCC, MLH1, MSH2, MSH6, NBN, PALB2, PMS2, PTEN, RAD51C, RAD51D, STK11, TP53, and XRCC2  CURRENT THERAPY:  INTERVAL HISTORY: [name] [name] 73 y.o. female returns for    Patient Active Problem List   Diagnosis Date Noted  . Hypercalcemia 04/13/2022  . Silicone leakage from breast implant 04/07/2020  . Overweight with body mass index (BMI) of 28 to 28.9  in adult 04/07/2020  . Uncontrolled type 2 diabetes mellitus with hyperglycemia (HCC) 04/07/2020  . Malignant neoplasm of overlapping sites of left breast in female, estrogen receptor positive (HCC) 12/29/2017  . Malignant neoplasm of central portion of right breast in female, estrogen receptor negative (HCC) 12/29/2017  . Primary osteoarthritis of right hip 03/02/2017  . Osteoarthritis of right hip 02/27/2017  . Genetic testing 11/17/2014  . Vaginal dryness 07/25/2014  . Dyspnea 07/19/2013  . Chemotherapy adverse reaction 07/14/2013  . Hyperlipidemia 02/01/2012  . GERD (gastroesophageal reflux disease) 02/01/2012  . Type 2 diabetes mellitus with hyperlipidemia (HCC) 02/01/2012  . Osteopenia     is allergic to darvon, penicillins, and clindamycin/lincomycin.  MEDICAL HISTORY: Past Medical History:  Diagnosis Date  . Arthritis    back & hip- R  . Breast cancer (HCC) 04/15/12   left-biopsy  . Breast cancer, right breast (HCC) 05/14/12   right mastectomy  . Bronchitis   . Cancer (HCC)    breast  . Chemotherapy-induced cardiomyopathy (HCC)   . CHF with unknown LVEF (HCC) 03/09/2013  . Diabetes mellitus   . Diabetes mellitus without complication (HCC)   . GERD (gastroesophageal reflux disease)    pt. experiencing on occasional , h/o og gastroparesis   . Heart murmur   . Hiatal hernia   . Osteopenia   . Pneumonia    treated as an outpt.   . Sinus problem   . Sleep apnea    sleep minimal concern- " along time ago"    SURGICAL  HISTORY: Past Surgical History:  Procedure Laterality Date  . AXILLARY SENTINEL NODE BIOPSY Left 05/14/2012   Procedure: AXILLARY SENTINEL NODE BIOPSY;  Surgeon: Ernestene Mention, MD;  Location: [facility] OR;  Service: General;  Laterality: Left;  . BREAST RECONSTRUCTION WITH PLACEMENT OF TISSUE EXPANDER AND FLEX HD (ACELLULAR HYDRATED DERMIS) Bilateral 05/14/2012   Procedure: BREAST RECONSTRUCTION WITH PLACEMENT OF TISSUE EXPANDER AND FLEX HD (ACELLULAR  HYDRATED DERMIS);  Surgeon: Etter Sjogren, MD;  Location: [facility] OR;  Service: Plastics;  Laterality: Bilateral;  . BUNIONECTOMY Bilateral   . CESAREAN SECTION  1981, 1983  . FOOT SURGERY    . GASTRIC BYPASS     1st surgery- at [facility], then an emergent surgery here  [facility]- for perforation of stomach & then a surgery for debridement   . PORT-A-CATH REMOVAL Right 09/12/2013   Procedure: REMOVAL PORT-A-CATH;  Surgeon: Ernestene Mention, MD;  Location: [facility];  Service: General;  Laterality: Right;  . PORTACATH PLACEMENT N/A 05/14/2012   Procedure: INSERTION PORT-A-CATH;  Surgeon: Ernestene Mention, MD;  Location: [facility] OR;  Service: General;  Laterality: N/A;  . SHOULDER SURGERY Left 06/2019  . TONSILLECTOMY    . TOTAL HIP ARTHROPLASTY Right 03/02/2017  . TOTAL HIP ARTHROPLASTY Right 03/02/2017   Procedure: RIGHT TOTAL HIP ARTHROPLASTY ANTERIOR APPROACH;  Surgeon: Gean Birchwood, MD;  Location: [facility] OR;  Service: Orthopedics;  Laterality: Right;  . TOTAL MASTECTOMY Bilateral 05/14/2012   Procedure: TOTAL MASTECTOMY;  Surgeon: Ernestene Mention, MD;  Location: [facility] OR;  Service: General;  Laterality: Bilateral;  . tummy tuck  1999  . WOUND DEBRIDEMENT  01/14/2011   Procedure: DEBRIDEMENT ABDOMINAL WOUND;  Surgeon: Ernestene Mention, MD;  Location: [facility];  Service: General;  Laterality: N/A;  wound debridement and debridement on the abdomen    SOCIAL HISTORY: Social History   Socioeconomic History  . Marital status: Married    Spouse name: Joe  . Number of children: 2  . Years of education: 76  . Highest education level: Master's degree (e.g., MA, MS, MEng, MEd, MSW, MBA)  Occupational History  . 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 [place], [place]  . Occupation: retired  Tobacco Use  . Smoking status: Former    Current packs/day: 0.00    Types: Cigarettes    Quit date: 02/25/2006    Years since quitting: 17.0  . Smokeless tobacco: Never  Vaping Use   . Vaping status: Never Used  Substance and Sexual Activity  . Alcohol use: Yes    Alcohol/week: 2.0 - 3.0 standard drinks of alcohol    Types: 2 - 3 Glasses of wine per week    Comment: no liquor  . Drug use: No  . Sexual activity: Yes    Partners: Male    Birth control/protection: Post-menopausal  Other Topics Concern  . Not on file  Social History Narrative  . Not on file   Social Drivers of Health   Financial Resource Strain: Low Risk  (06/23/2022)   Overall Financial Resource Strain (CARDIA)   . Difficulty of Paying Living Expenses: Not very hard  Food Insecurity: No Food Insecurity (06/23/2022)   Hunger Vital Sign   . Worried About Programme researcher, broadcasting/film/video in the Last Year: Never true   . Ran Out of Food in the Last Year: Never true  Transportation Needs: No Transportation Needs (06/23/2022)   PRAPARE - Transportation   . Lack of Transportation (Medical): No   .  Lack of Transportation (Non-Medical): No  Physical Activity: Sufficiently Active (06/23/2022)   Exercise Vital Sign   . Days of Exercise per Week: 7 days   . Minutes of Exercise per Session: 70 min  Stress: No Stress Concern Present (06/23/2022)   [employer] of Occupational Health - Occupational Stress Questionnaire   . Feeling of Stress : Only a little  Social Connections: Socially Integrated (06/23/2022)   Social Connection and Isolation Panel [NHANES]   . Frequency of Communication with Friends and Family: More than three times a week   . Frequency of Social Gatherings with Friends and Family: Once a week   . Attends Religious Services: More than 4 times per year   . Active Member of Clubs or Organizations: Yes   . Attends Banker Meetings: More than 4 times per year   . Marital Status: Married  Catering manager Violence: Not At Risk (03/03/2018)   Humiliation, Afraid, Rape, and Kick questionnaire   . Fear of Current or Ex-Partner: No   . Emotionally Abused: No   . Physically Abused: No   . Sexually  Abused: No    FAMILY HISTORY: Family History  Problem Relation Age of Onset  . Heart attack Father   . Diabetes Father   . Heart attack Brother   . Diabetes Brother   . Diabetes Sister   . Breast cancer Sister 24  . Diabetes Mother   . Breast cancer Mother 70  . Parkinson's disease Sister   . Heart attack Brother     Review of Systems - Oncology    PHYSICAL EXAMINATION    Vitals:   03/13/23 1044  BP: (!) 168/75  Pulse: 71  Resp: 18  Temp: (!) 97.4 F (36.3 C)  SpO2: 100%    Physical Exam  LABORATORY DATA:  CBC    Component Value Date/Time   WBC 5.1 03/07/2022 1436   WBC 4.8 12/29/2017 1054   RBC 4.83 03/07/2022 1436   HGB 14.9 03/07/2022 1436   HGB 14.4 12/10/2021 1555   HGB 13.2 12/30/2016 1045   HCT 43.6 03/07/2022 1436   HCT 43.5 12/10/2021 1555   HCT 40.1 12/30/2016 1045   PLT 264 03/07/2022 1436   PLT 268 12/10/2021 1555   MCV 90.3 03/07/2022 1436   MCV 90 12/10/2021 1555   MCV 84.7 12/30/2016 1045   MCH 30.8 03/07/2022 1436   MCHC 34.2 03/07/2022 1436   RDW 13.2 03/07/2022 1436   RDW 11.8 12/10/2021 1555   RDW 14.5 12/30/2016 1045   LYMPHSABS 1.6 03/07/2022 1436   LYMPHSABS 1.8 06/28/2019 1536   LYMPHSABS 1.8 12/30/2016 1045   MONOABS 0.4 03/07/2022 1436   MONOABS 0.5 12/30/2016 1045   EOSABS 0.0 03/07/2022 1436   EOSABS 0.1 06/28/2019 1536   BASOSABS 0.0 03/07/2022 1436   BASOSABS 0.0 06/28/2019 1536   BASOSABS 0.0 12/30/2016 1045    CMP     Component Value Date/Time   NA 142 10/28/2022 1252   NA 141 12/30/2016 1046   K 4.9 10/28/2022 1252   K 4.1 12/30/2016 1046   CL 102 10/28/2022 1252   CL 104 07/30/2012 0955   CO2 28 10/28/2022 1252   CO2 30 (H) 12/30/2016 1046   GLUCOSE 99 10/28/2022 1252   GLUCOSE 112 (H) 03/07/2022 1436   GLUCOSE 136 12/30/2016 1046   GLUCOSE 138 (H) 07/30/2012 0955   BUN 7 (L) 10/28/2022 1252   BUN 12.6 12/30/2016 1046   CREATININE 0.57 10/28/2022 1252  CREATININE 0.58 03/07/2022 1436    CREATININE 0.8 12/30/2016 1046   CALCIUM 9.9 12/22/2022 0957   CALCIUM 9.1 12/30/2016 1046   PROT 6.7 10/28/2022 1252   PROT 7.5 12/30/2016 1046   ALBUMIN 4.4 10/28/2022 1252   ALBUMIN 3.9 12/30/2016 1046   AST 24 10/28/2022 1252   AST 24 03/07/2022 1436   AST 20 12/30/2016 1046   ALT 21 10/28/2022 1252   ALT 16 03/07/2022 1436   ALT 14 12/30/2016 1046   ALKPHOS 95 10/28/2022 1252   ALKPHOS 102 12/30/2016 1046   BILITOT 0.4 10/28/2022 1252   BILITOT 0.5 03/07/2022 1436   BILITOT 0.40 12/30/2016 1046   GFRNONAA >60 03/07/2022 1436   GFRAA 105 03/28/2020 1501       PENDING LABS:   RADIOGRAPHIC STUDIES:  No results found.   PATHOLOGY:     ASSESSMENT and THERAPY PLAN:   No problem-specific Assessment & Plan notes found for this encounter.   No orders of the defined types were placed in this encounter.   All questions were answered. The patient knows to call the clinic with any problems, questions or concerns. We can certainly see the patient much sooner if necessary. This note was electronically signed. Noreene Filbert, NP 03/13/2023

## 2023-03-17 NOTE — Assessment & Plan Note (Signed)
[name] [name] has a history of bilateral breast cancer 1 triple positive the other triple negative.  She has no clinical or radiographic signs of breast cancer recurrence.  Her breast exam today is benign.  History of breast cancer.  No clinical signs of local recurrence. -Continue observation with annual breast exam  Headaches New onset of fleeting headaches. No other neurological symptoms reported. History of HER-2 breast cancer, which can metastasize to the brain. -Order brain MRI to rule out metastasis.  Hip Pain History of hip replacement 5 years ago. Recent increase in hip pain, particularly with elevation on treadmill. -Recommend revisiting physical therapy exercises for hip strengthening. -Consider evaluation by orthopedic surgeon who performed the hip replacement for possible imaging to check hardware.  Hypercalcemia Elevated calcium levels noted on previous lab work. -Repeat calcium level and complete blood count today. -Consider bone scan depending on results and concurrent evaluation by [name] for parathyroid hormone levels.  Follow-up -Results of imaging and lab work will be communicated by phone. -Schedule lab work on the way out today.  RTC in 1 year for LTS and sooner if needed.

## 2023-03-22 ENCOUNTER — Other Ambulatory Visit: Payer: Self-pay | Admitting: Internal Medicine

## 2023-03-23 ENCOUNTER — Other Ambulatory Visit: Payer: Self-pay

## 2023-03-25 ENCOUNTER — Other Ambulatory Visit: Payer: Self-pay

## 2023-03-26 LAB — PTH, INTACT AND CALCIUM
Calcium: 10.3 mg/dL (ref 8.7–10.3)
PTH: 40 pg/mL (ref 15–65)

## 2023-04-05 ENCOUNTER — Other Ambulatory Visit: Payer: Self-pay | Admitting: Internal Medicine

## 2023-04-06 ENCOUNTER — Encounter (HOSPITAL_COMMUNITY)
Admission: RE | Admit: 2023-04-06 | Discharge: 2023-04-06 | Disposition: A | Discharge status: Home / Self Care | Payer: Medicare Other | Source: Ambulatory Visit | Attending: Adult Health | Admitting: Adult Health

## 2023-04-06 DIAGNOSIS — Z171 Estrogen receptor negative status [ER-]: Secondary | ICD-10-CM | POA: Insufficient documentation

## 2023-04-06 DIAGNOSIS — C50111 Malignant neoplasm of central portion of right female breast: Secondary | ICD-10-CM | POA: Insufficient documentation

## 2023-04-06 DIAGNOSIS — C50911 Malignant neoplasm of unspecified site of right female breast: Secondary | ICD-10-CM | POA: Diagnosis not present

## 2023-04-06 MED ORDER — TECHNETIUM TC 99M MEDRONATE IV KIT
20.0000 | PACK | Freq: Once | INTRAVENOUS | Status: AC | PRN
  Administered 2023-04-06: 19.8 via INTRAVENOUS

## 2023-04-08 ENCOUNTER — Ambulatory Visit (HOSPITAL_COMMUNITY)
Admission: RE | Admit: 2023-04-08 | Discharge: 2023-04-08 | Disposition: A | Discharge status: Home / Self Care | Payer: Medicare Other | Source: Ambulatory Visit | Attending: Adult Health | Admitting: Adult Health

## 2023-04-08 DIAGNOSIS — R519 Headache, unspecified: Secondary | ICD-10-CM | POA: Insufficient documentation

## 2023-04-08 DIAGNOSIS — C50111 Malignant neoplasm of central portion of right female breast: Secondary | ICD-10-CM | POA: Insufficient documentation

## 2023-04-08 DIAGNOSIS — Z171 Estrogen receptor negative status [ER-]: Secondary | ICD-10-CM | POA: Insufficient documentation

## 2023-04-08 DIAGNOSIS — Z853 Personal history of malignant neoplasm of breast: Secondary | ICD-10-CM | POA: Diagnosis not present

## 2023-04-08 DIAGNOSIS — I6782 Cerebral ischemia: Secondary | ICD-10-CM | POA: Diagnosis not present

## 2023-04-08 DIAGNOSIS — C7981 Secondary malignant neoplasm of breast: Secondary | ICD-10-CM | POA: Diagnosis not present

## 2023-04-08 MED ORDER — GADOBUTROL 1 MMOL/ML IV SOLN
7.0000 mL | Freq: Once | INTRAVENOUS | Status: AC | PRN
  Administered 2023-04-08: 7 mL via INTRAVENOUS

## 2023-04-09 ENCOUNTER — Other Ambulatory Visit: Payer: Self-pay | Admitting: Internal Medicine

## 2023-04-14 ENCOUNTER — Telehealth: Payer: Self-pay | Admitting: *Deleted

## 2023-04-14 NOTE — Telephone Encounter (Signed)
 Per Lillard Anes, DNP, called pt with message below. Pt verbalized understanding. MRI was routed to [name] for review.

## 2023-04-14 NOTE — Telephone Encounter (Signed)
-----   Message from Noreene Filbert sent at 04/14/2023 11:14 AM EST ----- Please let patient know that the scans show no cancer!  Great news.  Please tell her I recommend she follow up with [name] about the MRI brian and how it states there are chronic ischemic changes.  This is likely normal aging, but I want [name] to weigh in.  Please fax results to [name] if she is not in [facility] system.  Thanks, LC ----- Message ----- From: Interface, Rad Results In Sent: 04/14/2023   9:18 AM EST To: [name] Socks, NP

## 2023-04-15 ENCOUNTER — Encounter: Payer: Self-pay | Admitting: Internal Medicine

## 2023-04-15 ENCOUNTER — Ambulatory Visit: Payer: Medicare Other

## 2023-04-15 ENCOUNTER — Ambulatory Visit: Payer: Medicare Other | Admitting: Internal Medicine

## 2023-04-15 VITALS — BP 132/70 | HR 81 | Temp 98.3°F | Ht 64.0 in | Wt 169.0 lb

## 2023-04-15 VITALS — BP 132/70 | HR 81 | Temp 98.3°F | Ht 64.0 in | Wt 169.6 lb

## 2023-04-15 DIAGNOSIS — Z0000 Encounter for general adult medical examination without abnormal findings: Secondary | ICD-10-CM

## 2023-04-15 DIAGNOSIS — Z853 Personal history of malignant neoplasm of breast: Secondary | ICD-10-CM

## 2023-04-15 DIAGNOSIS — E1169 Type 2 diabetes mellitus with other specified complication: Secondary | ICD-10-CM | POA: Diagnosis not present

## 2023-04-15 DIAGNOSIS — E785 Hyperlipidemia, unspecified: Secondary | ICD-10-CM

## 2023-04-15 DIAGNOSIS — E559 Vitamin D deficiency, unspecified: Secondary | ICD-10-CM

## 2023-04-15 DIAGNOSIS — M2550 Pain in unspecified joint: Secondary | ICD-10-CM

## 2023-04-15 DIAGNOSIS — R030 Elevated blood-pressure reading, without diagnosis of hypertension: Secondary | ICD-10-CM | POA: Diagnosis not present

## 2023-04-15 DIAGNOSIS — E049 Nontoxic goiter, unspecified: Secondary | ICD-10-CM

## 2023-04-15 NOTE — Patient Instructions (Signed)
 [name] [name] , Thank you for taking time to come for your Medicare Wellness Visit. I appreciate your ongoing commitment to your health goals. Please review the following plan we discussed and let me know if I can assist you in the future.   Referrals/Orders/Follow-Ups/Clinician Recommendations: none  This is a list of the screening recommended for you and due dates:  Health Maintenance  Topic Date Due   Eye exam for diabetics  04/03/2023   COVID-19 Vaccine (7 - 2024-25 season) 05/01/2023*   Hemoglobin A1C  04/27/2023   Yearly kidney health urinalysis for diabetes  10/28/2023   Complete foot exam   10/28/2023   Yearly kidney function blood test for diabetes  03/12/2024   Medicare Annual Wellness Visit  04/14/2024   DTaP/Tdap/Td vaccine (2 - Td or Tdap) 03/22/2031   Colon Cancer Screening  09/02/2032   Pneumonia Vaccine  Completed   Flu Shot  Completed   DEXA scan (bone density measurement)  Completed   Hepatitis C Screening  Completed   Zoster (Shingles) Vaccine  Completed   HPV Vaccine  Aged Out  *Topic was postponed. The date shown is not the original due date.    Advanced directives: (Copy Requested) Please bring a copy of your health care power of attorney and living will to the office to be added to your chart at your convenience.  Next Medicare Annual Wellness Visit scheduled for next year: Yes  insert Preventive Care attachment Insert FALL PREVENTION attachment if needed

## 2023-04-15 NOTE — Patient Instructions (Signed)

## 2023-04-15 NOTE — Progress Notes (Signed)
 I,Victoria T Deloria Lair, CMA,acting as a Neurosurgeon for [name], MD.,have documented all relevant documentation on the behalf of [name], MD,as directed by  [name], MD while in the presence of [name], MD.  Subjective:  Patient ID: [name] [name] , female    DOB: 1950/07/26 , 73 y.o.   MRN: [id number]  Chief Complaint  Patient presents with   Diabetes   Hyperlipidemia    HPI  She presents today for diabetes & chol check. She reports compliance with meds.   She would like to discuss her results from MRI & whole body scan completed recently.   She is also scheduled for [facility] with [facility] Advisor.   Diabetes She presents for her follow-up diabetic visit. She has type 2 diabetes mellitus. There are no hypoglycemic associated symptoms. There are no diabetic associated symptoms. Pertinent negatives for diabetes include no polydipsia, no polyphagia and no polyuria. There are no diabetic complications. Risk factors for coronary artery disease include diabetes mellitus, dyslipidemia, hypertension and post-menopausal. She participates in exercise daily. An ACE inhibitor/angiotensin II receptor blocker is not being taken.  Hyperlipidemia This is a chronic problem. The current episode started more than 1 year ago. The problem is controlled. Exacerbating diseases include diabetes. Current antihyperlipidemic treatment includes statins. The current treatment provides moderate improvement of lipids. There are no compliance problems.  Risk factors for coronary artery disease include post-menopausal, diabetes mellitus and dyslipidemia.     Past Medical History:  Diagnosis Date   Allergy    Arthritis    back & hip- R   Breast cancer (HCC) 04/15/2012   left-biopsy   Breast cancer, right breast (HCC) 05/14/2012   right mastectomy   Bronchitis    Cancer (HCC)    breast   Chemotherapy-induced cardiomyopathy (HCC)    CHF with unknown LVEF (HCC) 03/09/2013   Diabetes mellitus     Diabetes mellitus without complication (HCC)    GERD (gastroesophageal reflux disease)    pt. experiencing on occasional , h/o og gastroparesis    Heart murmur    Hiatal hernia    Osteopenia    Pneumonia    treated as an outpt.    Sinus problem    Sleep apnea    sleep minimal concern- " along time ago"   Ulcer      Family History  Problem Relation Age of Onset   Heart attack Father    Diabetes Father    Heart attack Brother    Diabetes Brother    Diabetes Sister    Breast cancer Sister 37   Diabetes Mother    Breast cancer Mother 83   Parkinson's disease Sister    Heart attack Brother      Current Outpatient Medications:    Ascorbic Acid (VITAMIN C) 500 MG tablet, Take 500 mg by mouth daily., Disp: , Rfl:    Cholecalciferol (VITAMIN D PO), Take 1 tablet by mouth daily., Disp: , Rfl:    CRESTOR 5 MG tablet, Take 1 tablet by mouth once daily, Disp: 90 tablet, Rfl: 0   dexlansoprazole (DEXILANT) 60 MG capsule, Take 1 capsule by mouth every morning., Disp: , Rfl:    ELDERBERRY PO, Take by mouth., Disp: , Rfl:    glucose blood (CONTOUR NEXT TEST) test strip, USE 1 STRIP TO CHECK GLUCOSE ONCE DAILY, Disp: 100 each, Rfl: 0   KRILL OIL PO, Take 1 capsule by mouth daily. , Disp: , Rfl:    Magnesium Oxide  250 MG TABS, Take 400 mg by mouth daily. , Disp: , Rfl:    Multiple Vitamin (MULTIVITAMIN PO), Take 1 tablet by mouth daily., Disp: , Rfl:    Probiotic Product (PROBIOTIC PO), Take 1 capsule by mouth daily., Disp: , Rfl:    Semaglutide (RYBELSUS) 7 MG TABS, Take 1 tablet (7 mg total) by mouth every morning., Disp: 90 tablet, Rfl: 2   Allergies  Allergen Reactions   Darvon Hives and Itching   Penicillins Hives and Itching    Has patient had a PCN reaction causing immediate rash, facial/tongue/throat swelling, SOB or lightheadedness with hypotension: No Has patient had a PCN reaction causing severe rash involving mucus membranes or skin necrosis: No Has patient had a PCN reaction  that required hospitalization: No Has patient had a PCN reaction occurring within the last 10 years: No If all of the above answers are "NO", then may proceed with Cephalosporin use.    Clindamycin/Lincomycin Other (See Comments)    vomiting     Review of Systems  Constitutional: Negative.   Respiratory: Negative.    Cardiovascular: Negative.   Gastrointestinal: Negative.   Endocrine: Negative for polydipsia, polyphagia and polyuria.  Neurological: Negative.   Psychiatric/Behavioral: Negative.       Today's Vitals   04/15/23 1431  BP: 132/70  Pulse: 81  Temp: 98.3 F (36.8 C)  SpO2: 98%  Weight: 169 lb 9.6 oz (76.9 kg)  Height: 5\' 4"  (1.626 m)   Body mass index is 29.11 kg/m.  Wt Readings from Last 3 Encounters:  04/28/23 169 lb (76.7 kg)  04/15/23 169 lb 9.6 oz (76.9 kg)  04/15/23 169 lb (76.7 kg)    BP Readings from Last 3 Encounters:  04/28/23 110/70  04/15/23 132/70  04/15/23 132/70     Objective:  Physical Exam Vitals and nursing note reviewed.  Constitutional:      Appearance: Normal appearance.  HENT:     Head: Normocephalic and atraumatic.  Eyes:     Extraocular Movements: Extraocular movements intact.  Neck:     Thyroid: Thyromegaly present.  Cardiovascular:     Rate and Rhythm: Normal rate and regular rhythm.     Heart sounds: Normal heart sounds.  Pulmonary:     Effort: Pulmonary effort is normal.     Breath sounds: Normal breath sounds.  Musculoskeletal:     Cervical back: Normal range of motion.  Skin:    General: Skin is warm.  Neurological:     General: No focal deficit present.     Mental Status: She is alert.  Psychiatric:        Mood and Affect: Mood normal.        Behavior: Behavior normal.         Assessment And Plan:  Type 2 diabetes mellitus with hyperlipidemia (HCC) Assessment & Plan: Chronic, well controlled with Rybelsus 7mg  daily. I will check labs as below. I will consider alternative dosing (M-F and skip weekends)  to decrease risk of adverse GI side effects. LDL goal is less than 70 - she is tolerant of BRAND Crestor, intolerant of rosuvastatin. She will f/u in 4 months.     Orders: -     CMP14+EGFR -     Hemoglobin A1c  Elevated blood pressure reading Assessment & Plan: Goal BP<120/80.  She is encouraged to decrease her sodium intake. She is encouraged to decrease intake of processed meats including bacon, sausages and deli meats. She will continue with her regular exercise regimen.  Goiter Assessment & Plan: She agrees to thyroid ultrasound. I will check labs as below.   Orders: -     TSH + free T4 -     US THYROID; Future  Hypercalcemia Assessment & Plan: Most recent PTH level was within normal limits.  She is reminded to stay well hydrated. She had nl SPEP in September 2024.  She is not on a diuretic. May need to consider Endo evaluation. Will check vitamin D level today.   Orders: -     VITAMIN D 25 Hydroxy (Vit-D Deficiency, Fractures)  Arthralgia, unspecified joint Assessment & Plan: She c/o b/l hand pain. I will check an autoimmune arthritis panel. She is encouraged to follow anti-inflammatory diet.   Orders: -     ANA, IFA (with reflex) -     CYCLIC CITRUL PEPTIDE ANTIBODY, IGG/IGA -     Rheumatoid factor -     Sedimentation rate -     Uric acid  History of breast cancer Assessment & Plan: MRI and bone scan results reviewed in detail with patient.       Return in 13 days (on 04/28/2023), or bp check NV, for 4 month dm f/u. Marland Kitchen  Patient was given opportunity to ask questions. Patient verbalized understanding of the plan and was able to repeat key elements of the plan. All questions were answered to their satisfaction.    I, [name], MD, have reviewed all documentation for this visit. The documentation on 04/15/23 for the exam, diagnosis, procedures, and orders are all accurate and complete.   IF YOU HAVE BEEN REFERRED TO A SPECIALIST, IT MAY TAKE 1-2 WEEKS TO  SCHEDULE/PROCESS THE REFERRAL. IF YOU HAVE NOT HEARD FROM US/SPECIALIST IN TWO WEEKS, PLEASE GIVE [place] A CALL AT [phone].   THE PATIENT IS ENCOURAGED TO PRACTICE SOCIAL DISTANCING DUE TO THE COVID-19 PANDEMIC.

## 2023-04-15 NOTE — Progress Notes (Signed)
 Subjective:   [place] G Dawood is a 73 y.o. who presents for a Medicare Wellness preventive visit.  Visit Complete: In person    AWV Questionnaire: Yes: Patient Medicare AWV questionnaire was completed by the patient on 04/13/2023; I have confirmed that all information answered by patient is correct and no changes since this date.  Cardiac Risk Factors include: advanced age (>77men, >80 women);diabetes mellitus;dyslipidemia     Objective:    Today's Vitals   04/15/23 1455  BP: 132/70  Pulse: 81  Temp: 98.3 F (36.8 C)  TempSrc: Oral  SpO2: 98%  Weight: 169 lb (76.7 kg)  Height: 5\' 4"  (1.626 m)   Body mass index is 29.01 kg/m.     04/15/2023    3:00 PM 04/10/2022    9:53 AM 03/21/2021   10:14 AM 03/14/2020   11:52 AM 03/09/2019   11:36 AM 03/03/2018    9:41 AM 03/02/2017    4:11 PM  Advanced Directives  Does Patient Have a Medical Advance Directive? Yes Yes Yes Yes Yes Yes Yes  Type of Estate agent of [place];Living will Healthcare Power of [place];Living will Healthcare Power of [place];Living will Healthcare Power of [place];Living will Healthcare Power of [place];Living will Healthcare Power of [place];Living will Living will;Healthcare Power of Attorney  Does patient want to make changes to medical advance directive?      No - Patient declined No - Patient declined  Copy of Healthcare Power of Attorney in Chart? No - copy requested No - copy requested No - copy requested No - copy requested No - copy requested No - copy requested No - copy requested    Current Medications (verified) Outpatient Encounter Medications as of 04/15/2023  Medication Sig   Ascorbic Acid (VITAMIN C) 500 MG tablet Take 500 mg by mouth daily.   Cholecalciferol (VITAMIN D PO) Take 1 tablet by mouth daily.   CRESTOR 5 MG tablet Take 1 tablet by mouth once daily   dexlansoprazole (DEXILANT) 60 MG capsule Take 1 capsule by mouth every morning.   ELDERBERRY PO Take by  mouth.   glucose blood (CONTOUR NEXT TEST) test strip USE 1 STRIP TO CHECK GLUCOSE ONCE DAILY   KRILL OIL PO Take 1 capsule by mouth daily.    Magnesium Oxide 250 MG TABS Take 400 mg by mouth daily.    Multiple Vitamin (MULTIVITAMIN PO) Take 1 tablet by mouth daily.   Probiotic Product (PROBIOTIC PO) Take 1 capsule by mouth daily.   Semaglutide (RYBELSUS) 7 MG TABS Take 1 tablet (7 mg total) by mouth every morning.   No facility-administered encounter medications on file as of 04/15/2023.    Allergies (verified) Darvon, Penicillins, and Clindamycin/lincomycin   History: Past Medical History:  Diagnosis Date   Allergy    Arthritis    back & hip- R   Breast cancer (HCC) 04/15/2012   left-biopsy   Breast cancer, right breast (HCC) 05/14/2012   right mastectomy   Bronchitis    Cancer (HCC)    breast   Chemotherapy-induced cardiomyopathy (HCC)    CHF with unknown LVEF (HCC) 03/09/2013   Diabetes mellitus    Diabetes mellitus without complication (HCC)    GERD (gastroesophageal reflux disease)    pt. experiencing on occasional , h/o og gastroparesis    Heart murmur    Hiatal hernia    Osteopenia    Pneumonia    treated as an outpt.    Sinus problem    Sleep apnea  sleep minimal concern- " along time ago"   Ulcer    Past Surgical History:  Procedure Laterality Date   AXILLARY SENTINEL NODE BIOPSY Left 05/14/2012   Procedure: AXILLARY SENTINEL NODE BIOPSY;  Surgeon: Ernestene Mention, MD;  Location: [facility] OR;  Service: General;  Laterality: Left;   BREAST RECONSTRUCTION WITH PLACEMENT OF TISSUE EXPANDER AND FLEX HD (ACELLULAR HYDRATED DERMIS) Bilateral 05/14/2012   Procedure: BREAST RECONSTRUCTION WITH PLACEMENT OF TISSUE EXPANDER AND FLEX HD (ACELLULAR HYDRATED DERMIS);  Surgeon: Etter Sjogren, MD;  Location: [facility] OR;  Service: Plastics;  Laterality: Bilateral;   BREAST SURGERY     BUNIONECTOMY Bilateral    CESAREAN SECTION  1981, 1983   FOOT SURGERY     GASTRIC BYPASS      1st surgery- at [facility], then an emergent surgery here  [facility]- for perforation of stomach & then a surgery for debridement    JOINT REPLACEMENT     PORT-A-CATH REMOVAL Right 09/12/2013   Procedure: REMOVAL PORT-A-CATH;  Surgeon: Ernestene Mention, MD;  Location: [facility];  Service: General;  Laterality: Right;   PORTACATH PLACEMENT N/A 05/14/2012   Procedure: INSERTION PORT-A-CATH;  Surgeon: Ernestene Mention, MD;  Location: [facility] OR;  Service: General;  Laterality: N/A;   SHOULDER SURGERY Left 06/2019   TONSILLECTOMY     TOTAL HIP ARTHROPLASTY Right 03/02/2017   TOTAL HIP ARTHROPLASTY Right 03/02/2017   Procedure: RIGHT TOTAL HIP ARTHROPLASTY ANTERIOR APPROACH;  Surgeon: Gean Birchwood, MD;  Location: [facility] OR;  Service: Orthopedics;  Laterality: Right;   TOTAL MASTECTOMY Bilateral 05/14/2012   Procedure: TOTAL MASTECTOMY;  Surgeon: Ernestene Mention, MD;  Location[facility] OR;  Service: General;  Laterality: Bilateral;   tummy tuck  1999   WOUND DEBRIDEMENT  01/14/2011   Procedure: DEBRIDEMENT ABDOMINAL WOUND;  Surgeon: Ernestene Mention, MD;  Location: [facility];  Service: General;  Laterality: N/A;  wound debridement and debridement on the abdomen   Family History  Problem Relation Age of Onset   Heart attack Father    Diabetes Father    Heart attack Brother    Diabetes Brother    Diabetes Sister    Breast cancer Sister 27   Diabetes Mother    Breast cancer Mother 51   Parkinson's disease Sister    Heart attack Brother    Social History   Socioeconomic History   Marital status: Married    Spouse name: Joe   Number of children: 2   Years of education: 18   Highest education level: Manufacturing engineer (e.g., MA, MS, MEng, MEd, MSW, MBA)  Occupational History   Occupation: Surveyor, minerals: [employer]   Occupation: retired  Tobacco Use   Smoking status: Former    Current packs/day: 0.00    Types: Cigarettes    Quit date: 02/25/2006     Years since quitting: 17.1   Smokeless tobacco: Never  Vaping Use   Vaping status: Never Used  Substance and Sexual Activity   Alcohol use: Yes    Alcohol/week: 2.0 - 3.0 standard drinks of alcohol    Types: 2 - 3 Glasses of wine per week    Comment: no liquor   Drug use: No   Sexual activity: Yes    Partners: Male    Birth control/protection: Post-menopausal  Other Topics Concern   Not on file  Social History Narrative   Not on file   Social Drivers of Health  Financial Resource Strain: Low Risk  (04/15/2023)   Overall Financial Resource Strain (CARDIA)    Difficulty of Paying Living Expenses: Not hard at all  Food Insecurity: No Food Insecurity (04/15/2023)   Hunger Vital Sign    Worried About Running Out of Food in the Last Year: Never true    Ran Out of Food in the Last Year: Never true  Transportation Needs: No Transportation Needs (04/15/2023)   PRAPARE - Administrator, Civil Service (Medical): No    Lack of Transportation (Non-Medical): No  Physical Activity: Sufficiently Active (04/15/2023)   Exercise Vital Sign    Days of Exercise per Week: 7 days    Minutes of Exercise per Session: 60 min  Stress: No Stress Concern Present (04/15/2023)   [employer] of Occupational Health - Occupational Stress Questionnaire    Feeling of Stress : Not at all  Social Connections: Moderately Integrated (04/15/2023)   Social Connection and Isolation Panel [NHANES]    Frequency of Communication with Friends and Family: More than three times a week    Frequency of Social Gatherings with Friends and Family: Twice a week    Attends Religious Services: 1 to 4 times per year    Active Member of [employer] or Organizations: No    Attends Engineer, structural: Never    Marital Status: Married    Tobacco Counseling Counseling given: Not Answered    Clinical Intake:  Pre-visit preparation completed: Yes  Pain : No/denies pain     Nutritional Status: BMI 25 -29  Overweight Nutritional Risks: None Diabetes: Yes CBG done?: No Did pt. bring in CBG monitor from home?: No  How often do you need to have someone help you when you read instructions, pamphlets, or other written materials from your doctor or pharmacy?: 1 - Never  Interpreter Needed?: No  Information entered by :: NAllen LPN   Activities of Daily Living     04/13/2023    9:47 AM  In your present state of health, do you have any difficulty performing the following activities:  Hearing? 0  Vision? 0  Difficulty concentrating or making decisions? 0  Walking or climbing stairs? 0  Dressing or bathing? 0  Doing errands, shopping? 0  Preparing Food and eating ? N  Using the Toilet? N  In the past six months, have you accidently leaked urine? N  Do you have problems with loss of bowel control? N  Managing your Medications? N  Managing your Finances? N  Housekeeping or managing your Housekeeping? N    Patient Care Team: Dorothyann Peng, MD as PCP - General (Internal Medicine) Chilton Si, MD as PCP - Cardiology (Cardiology) Jaymes Graff, MD as Consulting Physician (Obstetrics and Gynecology) Claud Kelp, MD as Consulting Physician (General Surgery) Etter Sjogren, MD as Consulting Physician (Plastic Surgery) Lyn Records, MD (Inactive) as Consulting Physician (Cardiology) Chalmers Guest, MD as Consulting Physician (Ophthalmology)  Indicate any recent Medical Services you may have received from other than [facility] providers in the past year (date may be approximate).     Assessment:   This is a routine wellness examination for [place].  Hearing/Vision screen Hearing Screening - Comments:: Denies hearing issues Vision Screening - Comments:: Regular eye exams, Dr. Harlon Flor   Goals Addressed             This Visit's Progress    Patient Stated       04/15/2023, stay healthy, exercise and eat right  Depression Screen     04/15/2023    3:02 PM 10/28/2022    12:03 PM 06/24/2022   11:18 AM 04/10/2022    9:54 AM 03/21/2021   10:15 AM 03/14/2020   11:53 AM 03/14/2020   11:35 AM  PHQ 2/9 Scores  PHQ - 2 Score 0 0 0 0 0 0 0  PHQ- 9 Score 0 0 0        Fall Risk     04/15/2023    2:32 PM 04/13/2023    9:47 AM 10/28/2022   12:03 PM 06/24/2022   11:18 AM 04/10/2022    9:54 AM  Fall Risk   Falls in the past year? 0 0 0 0 0  Number falls in past yr: 0 0 0 0 0  Injury with Fall? 0 0 0 0 0  Risk for fall due to : No Fall Risks Medication side effect No Fall Risks No Fall Risks Medication side effect  Follow up Falls evaluation completed Falls prevention discussed;Falls evaluation completed Falls evaluation completed Falls evaluation completed Falls prevention discussed;Education provided;Falls evaluation completed    MEDICARE RISK AT HOME:  Medicare Risk at Home Any stairs in or around the home?: (Patient-Rptd) Yes If so, are there any without handrails?: (Patient-Rptd) No Home free of loose throw rugs in walkways, pet beds, electrical cords, etc?: (Patient-Rptd) Yes Adequate lighting in your home to reduce risk of falls?: (Patient-Rptd) Yes Life alert?: (Patient-Rptd) No Use of a cane, walker or w/c?: (Patient-Rptd) No Grab bars in the bathroom?: (Patient-Rptd) No Shower chair or bench in shower?: (Patient-Rptd) Yes Elevated toilet seat or a handicapped toilet?: (Patient-Rptd) No  TIMED UP AND GO:  Was the test performed?  Yes  Length of time to ambulate 10 feet: 5 sec Gait steady and fast without use of assistive device  Cognitive Function: 6CIT completed        04/15/2023    3:02 PM 04/10/2022    9:55 AM 03/21/2021   10:16 AM 03/14/2020   11:54 AM 03/09/2019   11:39 AM  6CIT Screen  What Year? 0 points 0 points 0 points 0 points 0 points  What month? 0 points 0 points 0 points 0 points 0 points  What time? 0 points 0 points 0 points 0 points 0 points  Count back from 20 0 points 0 points 0 points 0 points 0 points  Months in reverse 0  points 0 points 0 points 0 points 0 points  Repeat phrase 0 points 0 points 0 points 2 points 0 points  Total Score 0 points 0 points 0 points 2 points 0 points    Immunizations Immunization History  Administered Date(s) Administered   Fluad Quad(high Dose 65+) 11/18/2019, 11/19/2020, 10/30/2021   Influenza, High Dose Seasonal PF 10/06/2018   Influenza,inj,Quad PF,6+ Mos 11/05/2012   Influenza-Unspecified 12/18/2017, 10/06/2018   PFIZER(Purple Top)SARS-COV-2 Vaccination 03/17/2019, 03/31/2019, 10/08/2019, 05/24/2020   Pfizer Covid-19 Vaccine Bivalent Booster 70yrs & up 11/14/2020   Pneumococcal Conjugate-13 10/06/2018   Pneumococcal Polysaccharide-23 03/03/2017   RSV,unspecified 11/12/2021   Tdap 03/21/2021   Unspecified SARS-COV-2 Vaccination 11/26/2021   Zoster Recombinant(Shingrix) 03/24/2017, 07/01/2017    Screening Tests Health Maintenance  Topic Date Due   OPHTHALMOLOGY EXAM  04/03/2023   COVID-19 Vaccine (7 - 2024-25 season) 05/01/2023 (Originally 10/19/2022)   HEMOGLOBIN A1C  04/27/2023   Diabetic kidney evaluation - Urine ACR  10/28/2023   FOOT EXAM  10/28/2023   Diabetic kidney evaluation - eGFR measurement  03/12/2024  Medicare Annual Wellness (AWV)  04/14/2024   DTaP/Tdap/Td (2 - Td or Tdap) 03/22/2031   Colonoscopy  09/02/2032   Pneumonia Vaccine 58+ Years old  Completed   INFLUENZA VACCINE  Completed   DEXA SCAN  Completed   Hepatitis C Screening  Completed   Zoster Vaccines- Shingrix  Completed   HPV VACCINES  Aged Out    Health Maintenance  Health Maintenance Due  Topic Date Due   OPHTHALMOLOGY EXAM  04/03/2023   Health Maintenance Items Addressed: Going to schedule appointment with Dr. Harlon Flor  Additional Screening:  Vision Screening: Recommended annual ophthalmology exams for early detection of glaucoma and other disorders of the eye.  Dental Screening: Recommended annual dental exams for proper oral hygiene  Community Resource Referral /  Chronic Care Management: CRR required this visit?  No   CCM required this visit?  No     Plan:     I have personally reviewed and noted the following in the patient's chart:   Medical and social history Use of alcohol, tobacco or illicit drugs  Current medications and supplements including opioid prescriptions. Patient is not currently taking opioid prescriptions. Functional ability and status Nutritional status Physical activity Advanced directives List of other physicians Hospitalizations, surgeries, and ER visits in previous 12 months Vitals Screenings to include cognitive, depression, and falls Referrals and appointments  In addition, I have reviewed and discussed with patient certain preventive protocols, quality metrics, and best practice recommendations. A written personalized care plan for preventive services as well as general preventive health recommendations were provided to patient.     Barb Merino, LPN   1/61/0960   After Visit Summary: (In Person-Printed) AVS printed and given to the patient  Notes: Nothing significant to report at this time.

## 2023-04-21 ENCOUNTER — Ambulatory Visit
Admission: RE | Admit: 2023-04-21 | Discharge: 2023-04-21 | Disposition: A | Discharge status: Home / Self Care | Payer: Medicare Other | Source: Ambulatory Visit | Attending: Internal Medicine | Admitting: Internal Medicine

## 2023-04-21 DIAGNOSIS — E049 Nontoxic goiter, unspecified: Secondary | ICD-10-CM | POA: Diagnosis not present

## 2023-04-22 LAB — CMP14+EGFR
ALT: 20 IU/L (ref 0–32)
AST: 28 IU/L (ref 0–40)
Albumin: 4.6 g/dL (ref 3.8–4.8)
Alkaline Phosphatase: 109 IU/L (ref 44–121)
BUN/Creatinine Ratio: 26 (ref 12–28)
BUN: 14 mg/dL (ref 8–27)
Bilirubin Total: 0.3 mg/dL (ref 0.0–1.2)
CO2: 26 mmol/L (ref 20–29)
Calcium: 10.7 mg/dL — ABNORMAL HIGH (ref 8.7–10.3)
Chloride: 102 mmol/L (ref 96–106)
Creatinine, Ser: 0.53 mg/dL — ABNORMAL LOW (ref 0.57–1.00)
Globulin, Total: 2 g/dL (ref 1.5–4.5)
Glucose: 117 mg/dL — ABNORMAL HIGH (ref 70–99)
Potassium: 4.2 mmol/L (ref 3.5–5.2)
Sodium: 143 mmol/L (ref 134–144)
Total Protein: 6.6 g/dL (ref 6.0–8.5)
eGFR: 98 mL/min/{1.73_m2} (ref >59)

## 2023-04-22 LAB — SEDIMENTATION RATE: Sed Rate: 2 mm/h (ref 0–40)

## 2023-04-22 LAB — ANTINUCLEAR ANTIBODIES, IFA: ANA Titer 1: NEGATIVE

## 2023-04-22 LAB — VITAMIN D 25 HYDROXY (VIT D DEFICIENCY, FRACTURES): Vit D, 25-Hydroxy: 54.9 ng/mL (ref 30.0–100.0)

## 2023-04-22 LAB — HEMOGLOBIN A1C
Est. average glucose Bld gHb Est-mCnc: 166 mg/dL
Hgb A1c MFr Bld: 7.4 % — ABNORMAL HIGH (ref 4.8–5.6)

## 2023-04-22 LAB — TSH+FREE T4
Free T4: 1.14 ng/dL (ref 0.82–1.77)
TSH: 0.569 u[IU]/mL (ref 0.450–4.500)

## 2023-04-22 LAB — RHEUMATOID FACTOR: Rheumatoid fact SerPl-aCnc: 19.5 [IU]/mL — ABNORMAL HIGH (ref <14.0)

## 2023-04-22 LAB — CYCLIC CITRUL PEPTIDE ANTIBODY, IGG/IGA: Cyclic Citrullin Peptide Ab: 5 U (ref 0–19)

## 2023-04-22 LAB — URIC ACID: Uric Acid: 5 mg/dL (ref 3.1–7.9)

## 2023-04-28 ENCOUNTER — Ambulatory Visit: Payer: Medicare Other

## 2023-04-28 VITALS — BP 110/70 | HR 89 | Temp 98.1°F | Ht 64.0 in | Wt 169.0 lb

## 2023-04-28 DIAGNOSIS — R030 Elevated blood-pressure reading, without diagnosis of hypertension: Secondary | ICD-10-CM

## 2023-04-28 NOTE — Progress Notes (Signed)
 Patient presents today for elevated bp follow up. She currently does not take any prescribed medications for bp. Denies headache, chest pain & sob. She does walk regularly.  BP Readings from Last 3 Encounters:  04/28/23 110/70  04/15/23 132/70  04/15/23 132/70  Per provider patient will follow up at June appointment. Blood pressure reading is much better. Patient aware.

## 2023-05-02 ENCOUNTER — Encounter: Payer: Self-pay | Admitting: Internal Medicine

## 2023-05-02 DIAGNOSIS — E559 Vitamin D deficiency, unspecified: Secondary | ICD-10-CM | POA: Insufficient documentation

## 2023-05-02 DIAGNOSIS — E049 Nontoxic goiter, unspecified: Secondary | ICD-10-CM | POA: Insufficient documentation

## 2023-05-02 DIAGNOSIS — R030 Elevated blood-pressure reading, without diagnosis of hypertension: Secondary | ICD-10-CM | POA: Insufficient documentation

## 2023-05-02 DIAGNOSIS — M2550 Pain in unspecified joint: Secondary | ICD-10-CM | POA: Insufficient documentation

## 2023-05-02 DIAGNOSIS — Z853 Personal history of malignant neoplasm of breast: Secondary | ICD-10-CM | POA: Insufficient documentation

## 2023-05-02 NOTE — Assessment & Plan Note (Signed)
 She agrees to thyroid ultrasound. I will check labs as below.

## 2023-05-02 NOTE — Assessment & Plan Note (Signed)
 MRI and bone scan results reviewed in detail with patient.

## 2023-05-02 NOTE — Assessment & Plan Note (Signed)
 I WILL CHECK A VIT D LEVEL AND SUPPLEMENT AS NEEDED.  ALSO ENCOURAGED TO SPEND 15 MINUTES IN THE SUN DAILY.

## 2023-05-02 NOTE — Assessment & Plan Note (Signed)
 Goal BP<120/80.  She is encouraged to decrease her sodium intake. She is encouraged to decrease intake of processed meats including bacon, sausages and deli meats. She will continue with her regular exercise regimen.

## 2023-05-02 NOTE — Assessment & Plan Note (Signed)
 Chronic, well controlled with Rybelsus 7mg  daily. I will check labs as below. I will consider alternative dosing (M-F and skip weekends) to decrease risk of adverse GI side effects. LDL goal is less than 70 - she is tolerant of BRAND Crestor, intolerant of rosuvastatin. She will f/u in 4 months.

## 2023-05-02 NOTE — Assessment & Plan Note (Signed)
 Most recent PTH level was within normal limits.  She is reminded to stay well hydrated. She had nl SPEP in September 2024.  She is not on a diuretic. May need to consider Endo evaluation. Will check vitamin D level today.

## 2023-05-02 NOTE — Assessment & Plan Note (Signed)
 She c/o b/l hand pain. I will check an autoimmune arthritis panel. She is encouraged to follow anti-inflammatory diet.

## 2023-05-19 DIAGNOSIS — E119 Type 2 diabetes mellitus without complications: Secondary | ICD-10-CM | POA: Diagnosis not present

## 2023-05-19 DIAGNOSIS — H40013 Open angle with borderline findings, low risk, bilateral: Secondary | ICD-10-CM | POA: Diagnosis not present

## 2023-05-19 LAB — HM DIABETES EYE EXAM

## 2023-07-21 ENCOUNTER — Telehealth: Payer: Self-pay

## 2023-07-21 ENCOUNTER — Ambulatory Visit: Admitting: Internal Medicine

## 2023-07-21 ENCOUNTER — Encounter: Payer: Self-pay | Admitting: Internal Medicine

## 2023-07-21 VITALS — BP 122/80 | HR 82 | Temp 98.3°F | Ht 64.0 in | Wt 166.8 lb

## 2023-07-21 DIAGNOSIS — E663 Overweight: Secondary | ICD-10-CM | POA: Diagnosis not present

## 2023-07-21 DIAGNOSIS — E1169 Type 2 diabetes mellitus with other specified complication: Secondary | ICD-10-CM

## 2023-07-21 DIAGNOSIS — R051 Acute cough: Secondary | ICD-10-CM | POA: Diagnosis not present

## 2023-07-21 DIAGNOSIS — E785 Hyperlipidemia, unspecified: Secondary | ICD-10-CM | POA: Diagnosis not present

## 2023-07-21 DIAGNOSIS — Z6828 Body mass index (BMI) 28.0-28.9, adult: Secondary | ICD-10-CM

## 2023-07-21 MED ORDER — CRESTOR 5 MG PO TABS: 5.0000 mg | ORAL_TABLET | Freq: Every day | ORAL | 2 refills | Status: DC

## 2023-07-21 NOTE — Patient Instructions (Signed)

## 2023-07-21 NOTE — Progress Notes (Unsigned)
 I,Victoria T Basil Lim, CMA,acting as a Neurosurgeon for Smiley Dung, MD.,have documented all relevant documentation on the behalf of Smiley Dung, MD,as directed by  Smiley Dung, MD while in the presence of Smiley Dung, MD.  Subjective:  Patient ID: [name] [name] , female    DOB: [birth year] , 73 y.o.   MRN: [id number]  Chief Complaint  Patient presents with   Chest Congestion    Patient presents today for cough & chest congestion. This has been an ongoing issue for 2 weeks. Denies fever/ chills. At home she uses zycam & vit C.     HPI Discussed the use of AI scribe software for clinical note transcription with the patient, who gave verbal consent to proceed.  History of Present Illness [name] [name] is a 73 year old female who presents with a cough and mucus production.  She has been experiencing a cough with yellow mucus production for four days. Initially, she had a sore throat that resolved after a day, followed by the onset of the cough, which is primarily located in the chest. No fever or chills. She has not taken any over-the-counter medications for the cough but has been using Zicam and extra vitamin C.  She has a history of diabetes and is due for a diabetes check. She has been without Crestor  for about 2 months due to prescription renewal issues. She also takes multivitamins and magnesium supplements.  She has been active, walking outside most days, and reports some sneezing during these activities. She has lost three pounds and monitors her blood sugar, which has dropped to around seventy at times when she forgets to eat. She engages in some strength training exercises at home.  She has been busy with various appointments and babysitting her grandbabies. Her husband has had similar symptoms for about two weeks but has not been tested for COVID.   Diabetes She presents for her follow-up diabetic visit. She has type 2 diabetes mellitus. There are no hypoglycemic  associated symptoms. There are no diabetic associated symptoms. Pertinent negatives for diabetes include no polydipsia, no polyphagia and no polyuria. There are no diabetic complications. Risk factors for coronary artery disease include diabetes mellitus, dyslipidemia, hypertension and post-menopausal. She participates in exercise daily. An ACE inhibitor/angiotensin II receptor blocker is not being taken.  Hyperlipidemia This is a chronic problem. The current episode started more than 1 year ago. The problem is controlled. Exacerbating diseases include diabetes. Current antihyperlipidemic treatment includes statins. The current treatment provides moderate improvement of lipids. There are no compliance problems.  Risk factors for coronary artery disease include post-menopausal, diabetes mellitus and dyslipidemia.     Past Medical History:  Diagnosis Date   Allergy    Arthritis    back & hip- R   Breast cancer (HCC) 04/15/2012   left-biopsy   Breast cancer, right breast (HCC) 05/14/2012   right mastectomy   Bronchitis    Cancer (HCC)    breast   Chemotherapy-induced cardiomyopathy (HCC)    CHF with unknown LVEF (HCC) 03/09/2013   Diabetes mellitus    Diabetes mellitus without complication (HCC)    GERD (gastroesophageal reflux disease)    pt. experiencing on occasional , h/o og gastroparesis    Heart murmur    Hiatal hernia    Osteopenia    Pneumonia    treated as an outpt.    Sinus problem    Sleep apnea    sleep minimal concern- " along time ago"  Ulcer      Family History  Problem Relation Age of Onset   Heart attack Father    Diabetes Father    Heart attack Brother    Diabetes Brother    Diabetes Sister    Breast cancer Sister 23   Diabetes Mother    Breast cancer Mother 82   Parkinson's disease Sister    Heart attack Brother      Current Outpatient Medications:    Ascorbic Acid (VITAMIN C) 500 MG tablet, Take 500 mg by mouth daily., Disp: , Rfl:     Cholecalciferol (VITAMIN D  PO), Take 1 tablet by mouth daily., Disp: , Rfl:    dexlansoprazole (DEXILANT) 60 MG capsule, Take 1 capsule by mouth every morning., Disp: , Rfl:    ELDERBERRY PO, Take by mouth., Disp: , Rfl:    glucose blood (CONTOUR NEXT TEST) test strip, USE 1 STRIP TO CHECK GLUCOSE ONCE DAILY, Disp: 100 each, Rfl: 0   KRILL OIL PO, Take 1 capsule by mouth daily. , Disp: , Rfl:    Magnesium Oxide 250 MG TABS, Take 400 mg by mouth daily. , Disp: , Rfl:    Multiple Vitamin (MULTIVITAMIN PO), Take 1 tablet by mouth daily., Disp: , Rfl:    Probiotic Product (PROBIOTIC PO), Take 1 capsule by mouth daily., Disp: , Rfl:    Semaglutide  (RYBELSUS ) 7 MG TABS, Take 1 tablet (7 mg total) by mouth every morning., Disp: 90 tablet, Rfl: 2   CRESTOR  5 MG tablet, Take 1 tablet (5 mg total) by mouth daily., Disp: 90 tablet, Rfl: 2   Allergies  Allergen Reactions   Darvon Hives and Itching   Penicillins Hives and Itching    Has patient had a PCN reaction causing immediate rash, facial/tongue/throat swelling, SOB or lightheadedness with hypotension: No Has patient had a PCN reaction causing severe rash involving mucus membranes or skin necrosis: No Has patient had a PCN reaction that required hospitalization: No Has patient had a PCN reaction occurring within the last 10 years: No If all of the above answers are "NO", then may proceed with Cephalosporin use.    Clindamycin/Lincomycin Other (See Comments)    vomiting     Review of Systems  Constitutional: Negative.   HENT:  Positive for rhinorrhea.   Respiratory:  Positive for cough.   Cardiovascular: Negative.   Gastrointestinal: Negative.   Endocrine: Negative for polydipsia, polyphagia and polyuria.  Neurological: Negative.   Psychiatric/Behavioral: Negative.       Today's Vitals   07/21/23 1410  BP: 122/80  Pulse: 82  Temp: 98.3 F (36.8 C)  SpO2: 98%  Weight: 166 lb 12.8 oz (75.7 kg)  Height: 5\' 4"  (1.626 m)   Body mass  index is 28.63 kg/m.  Wt Readings from Last 3 Encounters:  07/21/23 166 lb 12.8 oz (75.7 kg)  04/28/23 169 lb (76.7 kg)  04/15/23 169 lb 9.6 oz (76.9 kg)     Objective:  Physical Exam Vitals and nursing note reviewed.  Constitutional:      Appearance: Normal appearance.  HENT:     Head: Normocephalic and atraumatic.     Right Ear: Tympanic membrane, ear canal and external ear normal. There is no impacted cerumen.     Left Ear: Tympanic membrane, ear canal and external ear normal. There is no impacted cerumen.     Mouth/Throat:     Pharynx: Posterior oropharyngeal erythema present. No oropharyngeal exudate.  Eyes:     Extraocular Movements: Extraocular movements  intact.  Cardiovascular:     Rate and Rhythm: Normal rate and regular rhythm.     Heart sounds: Normal heart sounds.  Pulmonary:     Effort: Pulmonary effort is normal.     Breath sounds: Normal breath sounds.  Skin:    General: Skin is warm.  Neurological:     General: No focal deficit present.     Mental Status: She is alert.  Psychiatric:        Mood and Affect: Mood normal.        Behavior: Behavior normal.         Assessment And Plan:  Acute cough Assessment & Plan: Cough with yellow mucus likely due to allergies. Sinus drainage suspected. COVID-19 considered due to current prevalence. - Perform COVID-19 test, this was negative.  - Recommend Zyrtec or Claritin. - Advise against dairy for 10 days. - Encourage honey tea for cough relief.  Orders: -     POC COVID-19 BinaxNow  Type 2 diabetes mellitus with hyperlipidemia (HCC) Assessment & Plan: Chronic, this has been stable with Rybelsus .  Blood sugar occasionally below 80 due to missed meals. Weight loss noted. Rybelsus  may suppress appetite. Emphasized regular meals and strength training to prevent hypoglycemia and muscle atrophy. - Perform diabetes check today. - Advise eating every four hours. - Encourage strength training. -Overdue for eye exam.  Records from Doctor Whitaker not yet received.  - Contact Doctor Gwendalyn Lemma for eye exam records. - Encourage regular physical activity.  Regarding hyperlipidemia, she has been off of branded Crestor  due to insurance issues. Cholesterol likely elevated. Prior authorization for Crestor  initiated. - Send prescription for Crestor  prior authorization. - Recheck cholesterol in a couple of months.  Orders: -     CMP14+EGFR -     Lipid panel -     Hemoglobin A1c  Overweight with body mass index (BMI) of 28 to 28.9 in adult Assessment & Plan: Her BMI is acceptable for her demographic. She was congratulated on her lifestyle changes.     Return if symptoms worsen or fail to improve.  Patient was given opportunity to ask questions. Patient verbalized understanding of the plan and was able to repeat key elements of the plan. All questions were answered to their satisfaction.    I, Smiley Dung, MD, have reviewed all documentation for this visit. The documentation on 07/21/23 for the exam, diagnosis, procedures, and orders are all accurate and complete.   IF YOU HAVE BEEN REFERRED TO A SPECIALIST, IT MAY TAKE 1-2 WEEKS TO SCHEDULE/PROCESS THE REFERRAL. IF YOU HAVE NOT HEARD FROM US /SPECIALIST IN TWO WEEKS, PLEASE GIVE US  A CALL AT [phone].   THE PATIENT IS ENCOURAGED TO PRACTICE SOCIAL DISTANCING DUE TO THE COVID-19 PANDEMIC.

## 2023-07-22 LAB — CMP14+EGFR
ALT: 19 IU/L (ref 0–32)
AST: 28 IU/L (ref 0–40)
Albumin: 4.3 g/dL (ref 3.8–4.8)
Alkaline Phosphatase: 117 IU/L (ref 44–121)
BUN/Creatinine Ratio: 13 (ref 12–28)
BUN: 8 mg/dL (ref 8–27)
Bilirubin Total: 0.3 mg/dL (ref 0.0–1.2)
CO2: 26 mmol/L (ref 20–29)
Calcium: 10.7 mg/dL — ABNORMAL HIGH (ref 8.7–10.3)
Chloride: 102 mmol/L (ref 96–106)
Creatinine, Ser: 0.61 mg/dL (ref 0.57–1.00)
Globulin, Total: 2.3 g/dL (ref 1.5–4.5)
Glucose: 104 mg/dL — ABNORMAL HIGH (ref 70–99)
Potassium: 4.8 mmol/L (ref 3.5–5.2)
Sodium: 145 mmol/L — ABNORMAL HIGH (ref 134–144)
Total Protein: 6.6 g/dL (ref 6.0–8.5)
eGFR: 94 mL/min/{1.73_m2} (ref >59)

## 2023-07-22 LAB — LIPID PANEL
Chol/HDL Ratio: 2.3 ratio (ref 0.0–4.4)
Cholesterol, Total: 171 mg/dL (ref 100–199)
HDL: 75 mg/dL (ref >39)
LDL Chol Calc (NIH): 81 mg/dL (ref 0–99)
Triglycerides: 82 mg/dL (ref 0–149)
VLDL Cholesterol Cal: 15 mg/dL (ref 5–40)

## 2023-07-22 LAB — HEMOGLOBIN A1C
Est. average glucose Bld gHb Est-mCnc: 148 mg/dL
Hgb A1c MFr Bld: 6.8 % — ABNORMAL HIGH (ref 4.8–5.6)

## 2023-07-23 ENCOUNTER — Other Ambulatory Visit: Payer: Self-pay

## 2023-07-23 ENCOUNTER — Ambulatory Visit: Payer: Self-pay | Admitting: Internal Medicine

## 2023-07-23 MED ORDER — CRESTOR 5 MG PO TABS: 5.0000 mg | ORAL_TABLET | Freq: Every day | ORAL | 2 refills | Status: AC

## 2023-07-27 DIAGNOSIS — R051 Acute cough: Secondary | ICD-10-CM | POA: Insufficient documentation

## 2023-07-27 NOTE — Assessment & Plan Note (Signed)
 Cough with yellow mucus likely due to allergies. Sinus drainage suspected. COVID-19 considered due to current prevalence. - Perform COVID-19 test, this was negative.  - Recommend Zyrtec or Claritin. - Advise against dairy for 10 days. - Encourage honey tea for cough relief.

## 2023-07-27 NOTE — Assessment & Plan Note (Signed)
Her BMI is acceptable for her demographic. She was congratulated on her lifestyle changes.

## 2023-07-27 NOTE — Assessment & Plan Note (Signed)
 Chronic, this has been stable with Rybelsus .  Blood sugar occasionally below 80 due to missed meals. Weight loss noted. Rybelsus  may suppress appetite. Emphasized regular meals and strength training to prevent hypoglycemia and muscle atrophy. - Perform diabetes check today. - Advise eating every four hours. - Encourage strength training. -Overdue for eye exam. Records from Doctor Whitaker not yet received.  - Contact Doctor Gwendalyn Lemma for eye exam records. - Encourage regular physical activity.  Regarding hyperlipidemia, she has been off of branded Crestor  due to insurance issues. Cholesterol likely elevated. Prior authorization for Crestor  initiated. - Send prescription for Crestor  prior authorization. - Recheck cholesterol in a couple of months.

## 2023-07-28 LAB — POC COVID19 BINAXNOW: SARS Coronavirus 2 Ag: NEGATIVE

## 2023-08-03 ENCOUNTER — Encounter (HOSPITAL_BASED_OUTPATIENT_CLINIC_OR_DEPARTMENT_OTHER): Payer: Self-pay | Admitting: Cardiovascular Disease

## 2023-08-03 ENCOUNTER — Ambulatory Visit (INDEPENDENT_AMBULATORY_CARE_PROVIDER_SITE_OTHER): Payer: Medicare Other | Admitting: Cardiovascular Disease

## 2023-08-03 VITALS — BP 122/70 | HR 64 | Ht 64.0 in | Wt 162.0 lb

## 2023-08-03 DIAGNOSIS — I427 Cardiomyopathy due to drug and external agent: Secondary | ICD-10-CM | POA: Diagnosis not present

## 2023-08-03 DIAGNOSIS — I6521 Occlusion and stenosis of right carotid artery: Secondary | ICD-10-CM | POA: Diagnosis not present

## 2023-08-03 DIAGNOSIS — T451X5D Adverse effect of antineoplastic and immunosuppressive drugs, subsequent encounter: Secondary | ICD-10-CM | POA: Diagnosis not present

## 2023-08-03 DIAGNOSIS — T451X5A Adverse effect of antineoplastic and immunosuppressive drugs, initial encounter: Secondary | ICD-10-CM

## 2023-08-03 NOTE — Progress Notes (Signed)
 Cardiology Office Note:  .   Date:  08/03/2023  ID:  [name] [name], DOB [birth year], MRN [id number] PCP: [name] Curling, MD  [facility] Providers Cardiologist:  [name] Sos, MD    History of Present Illness: .   [name] [name] is a 73 y.o. female with a hx of hyperlipidemia and bilateral breast cancer HER-2 +& ER+, treated with bilateral mastectomy/reconstruction (2014), adjuvant therapy ( Doxorubicin /Cyclophosphamise--> Abraxine x 12 + Trastuzumab  (completed 12/03/2012), Trastuzumab  (Herceptin ) duration 7//2014 thru 06/29/2013 (DC due to LV dysfunction), and finally letrozole  (DC 2019) here to for follow up.  While being treated for her breast cancer she developed shortness of breath.  Echo at the time revealed LVEF 50%.  Herceptin  was discontinued and LVEF improved to 65%.  Since that time she has not had any active cardiac issues.  She last saw Dr. Felipe [name] in 2021 and was doing well.  At her visit 06/2022 she was doing well.  She had rare episodes of palpitations.    Discussed the use of AI scribe software for clinical note transcription with the patient, who gave verbal consent to proceed.  History of Present Illness [name] [name] is doing well overall with no complaints. She engages in regular exercise, including walking on her treadmill for three to four miles a day or walking outside for an hour or more. She feels good during exercise and experiences no chest pain or dyspnea.  She has no peripheral edema or orthopnea. She is currently taking rosuvastatin  and recently had her cholesterol checked, which showed an LDL of 81.  A past carotid ultrasound from 2020 showed some thickening on the right side, although it was not obstructive.   She is mindful of her diet, avoiding fried foods, fatty foods, and red meat, and is focused on maintaining a healthy lifestyle.  Family history is significant for her father's history of arteriosclerosis.   ROS:  As per HPI  Studies  Reviewed: [name] [name]        EKG Interpretation Date/Time:    Ventricular Rate:    PR Interval:    QRS Duration:    QT Interval:    QTC Calculation:   R Axis:      Text Interpretation:         Carotid Doppler 03/2018: IMPRESSION: 1. Moderate amount of right-sided atherosclerotic plaque, not resulting in hemodynamically significant stenosis. 2. Unremarkable sonographic evaluation of the left carotid system. Risk Assessment/Calculations:             Physical Exam:   VS:  BP 122/70 (BP Location: Left Arm, Patient Position: Sitting, Cuff Size: Normal)   Pulse 64   Ht 5' 4 (1.626 m)   Wt 162 lb (73.5 kg)   BMI 27.81 kg/m  , BMI Body mass index is 27.81 kg/m. GENERAL:  Well appearing HEENT: Pupils equal round and reactive, fundi not visualized, oral mucosa unremarkable NECK:  No jugular venous distention, waveform within normal limits, carotid upstroke brisk and symmetric, no bruits, no thyromegaly LUNGS:  Clear to auscultation bilaterally HEART:  RRR.  PMI not displaced or sustained,S1 and S2 within normal limits, no S3, no S4, no clicks, no rubs, no murmurs ABD:  Flat, positive bowel sounds normal in frequency in pitch, no bruits, no rebound, no guarding, no midline pulsatile mass, no hepatomegaly, no splenomegaly EXT:  2 plus pulses throughout, no edema, no cyanosis no clubbing SKIN:  No rashes no nodules NEURO:  Cranial nerves II through XII grossly intact, motor grossly intact  throughout [facility]:  Cognitively intact, oriented to person place and time   ASSESSMENT AND PLAN: .    # Carotid stenosis:  Carotid ultrasound in 2020 showed moderate right carotid artery plaque. Current LDL is 81, target is 70 to reduce cardiovascular risk. - Order carotid ultrasound to assess plaque changes since 2020. - Consider increasing rosuvastatin  to 10 mg if plaque persists. - Contact her with ultrasound results and adjust treatment plan as necessary.  # Hyperlipidemia Managed with rosuvastatin   5 mg. LDL is 81, target is 70 due to carotid plaque. - Continue rosuvastatin  5 mg daily. - Re-evaluate rosuvastatin  dosage based on carotid ultrasound results. - Recheck labs in 2-3 months if rosuvastatin  dosage is adjusted. - Encouraged to continue with regular exercise       Dispo: f/u 1 year  Signed, [name] Sos, MD

## 2023-08-03 NOTE — Patient Instructions (Signed)
 Medication Instructions:  Your physician recommends that you continue on your current medications as directed. Please refer to the Current Medication list given to you today.   *If you need a refill on your cardiac medications before your next appointment, please call your pharmacy*  Lab Work: NONE   Testing/Procedures: Your physician has requested that you have a carotid duplex. This test is an ultrasound of the carotid arteries in your neck. It looks at blood flow through these arteries that supply the brain with blood. Allow one hour for this exam. There are no restrictions or special instructions.  Follow-Up: At [facility], you and your health needs are our priority.  As part of our continuing mission to provide you with exceptional heart care, our providers are all part of one team.  This team includes your primary Cardiologist (physician) and Advanced Practice Providers or APPs (Physician Assistants and Nurse Practitioners) who all work together to provide you with the care you need, when you need it.  Your next appointment:   12 month(s)  Provider:   Maudine Sos, MD, Slater Duncan, NP, or Neomi Banks, NP    We recommend signing up for the patient portal called MyChart.  Sign up information is provided on this After Visit Summary.  MyChart is used to connect with patients for Virtual Visits (Telemedicine).  Patients are able to view lab/test results, encounter notes, upcoming appointments, etc.  Non-urgent messages can be sent to your provider as well.   To learn more about what you can do with MyChart, go to [email or web address].

## 2023-08-12 ENCOUNTER — Other Ambulatory Visit

## 2023-08-12 DIAGNOSIS — E1169 Type 2 diabetes mellitus with other specified complication: Secondary | ICD-10-CM | POA: Diagnosis not present

## 2023-08-12 DIAGNOSIS — M8580 Other specified disorders of bone density and structure, unspecified site: Secondary | ICD-10-CM | POA: Diagnosis not present

## 2023-08-12 DIAGNOSIS — E559 Vitamin D deficiency, unspecified: Secondary | ICD-10-CM | POA: Diagnosis not present

## 2023-08-12 DIAGNOSIS — E119 Type 2 diabetes mellitus without complications: Secondary | ICD-10-CM | POA: Diagnosis not present

## 2023-08-13 ENCOUNTER — Ambulatory Visit: Payer: Self-pay | Admitting: Internal Medicine

## 2023-08-13 ENCOUNTER — Other Ambulatory Visit

## 2023-08-31 ENCOUNTER — Ambulatory Visit (HOSPITAL_BASED_OUTPATIENT_CLINIC_OR_DEPARTMENT_OTHER)

## 2023-08-31 DIAGNOSIS — I6521 Occlusion and stenosis of right carotid artery: Secondary | ICD-10-CM

## 2023-09-01 ENCOUNTER — Ambulatory Visit: Payer: Self-pay | Admitting: Internal Medicine

## 2023-09-02 DIAGNOSIS — E559 Vitamin D deficiency, unspecified: Secondary | ICD-10-CM | POA: Diagnosis not present

## 2023-09-06 ENCOUNTER — Ambulatory Visit: Payer: Self-pay | Admitting: Cardiovascular Disease

## 2023-09-07 ENCOUNTER — Encounter: Payer: Self-pay | Admitting: Internal Medicine

## 2023-09-07 ENCOUNTER — Ambulatory Visit (INDEPENDENT_AMBULATORY_CARE_PROVIDER_SITE_OTHER): Admitting: Internal Medicine

## 2023-09-07 DIAGNOSIS — E1169 Type 2 diabetes mellitus with other specified complication: Secondary | ICD-10-CM

## 2023-09-07 DIAGNOSIS — E785 Hyperlipidemia, unspecified: Secondary | ICD-10-CM | POA: Diagnosis not present

## 2023-09-07 NOTE — Patient Instructions (Signed)
 Sarcoidosis: What to Know  Sarcoidosis is a disease that causes irritation and swelling called inflammation. Normally, cells that are part of your body's defense system (immune system) attack harmful things in your body, such as germs. But when you have sarcoidosis, your immune system can cause swelling for no reason. This most often affects your lungs, but it can also affect: Your lymph nodes. Your eyes. Your skin. Your heart. Any other body tissue. Cells from your immune system may also form small lumps called granulomas in your body. What are the causes? The exact cause isn't known. If you have a family history of the disease, your immune system may be triggered by something around you, such as: Germs. Chemicals. Dust. Mold or mildew. What increases the risk? You may be more likely to get sarcoidosis if: Someone else in your family has or has had the disease. You're African American. You're of Northern European descent. You're 68-48 years old. You're female. You work in a place where you're around: Metals. Chemicals. These include chemicals used to kill insects. Mold. 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 What are the signs or symptoms? Your symptoms may be mild. They may also depend on what part of your body is affected. Common symptoms include: Coughing. Shortness of breath. Chest pain. Other symptoms include: Night sweats. Fever. Weight loss. Tiredness. Swollen lymph nodes. Joint pain. In some cases, you may not have symptoms. How is this diagnosed? You may be diagnosed based on your symptoms, medical history, and an exam. You may also need tests. These may include: A chest X-ray. A CT scan. An MRI. A PET scan. Lung function tests. These tests check your breathing and look for other problems with your lungs. A biopsy. This is when a small piece of tissue is removed for testing. How is this treated? If your symptoms are mild or you have no symptoms, you may not need  treatment. If your symptoms bother you or are bad, you may need to take medicines. These may include: Prednisone. This is a steroid that reduces swelling. Hydroxychloroquine. This may be used if your skin, eyes, or brain are affected. Immunosuppressants. These are medicines that weaken your body's immune system. Follow these instructions at home: Take your medicines only as told. Do not smoke, vape, or use nicotine or tobacco. Stay away from: People who are smoking. Dust. Chemicals. Stay indoors on days when air quality is poor. Ask what things are safe for you to do at home. Ask when you can go back to work or school. Where to find more information National Heart, Lung, and Blood Institute: BedroomRental.com.ee Contact a health care provider if: You have trouble seeing. You have a dry cough that won't go away. Your heart is beating in a way that's not normal. You have pain or aches in: Your joints. Your hands. Your feet. You have a rash and you're not sure why. Get help right away if: You have chest pain. You have trouble breathing. These symptoms may be an emergency. Call 911 right away. Do not wait to see if the symptoms will go away. Do not drive yourself to the hospital. This information is not intended to replace advice given to you by your health care provider. Make sure you discuss any questions you have with your health care provider. Document Revised: 09/11/2022 Document Reviewed: 09/11/2022 Elsevier Patient Education  2024 [employer].

## 2023-09-07 NOTE — Progress Notes (Signed)
 I,[name], CMA,acting as a Neurosurgeon for [name] LOISE Slocumb, MD.,have documented all relevant documentation on the behalf of [name] LOISE Slocumb, MD,as directed by  [name] LOISE Slocumb, MD while in the presence of [name] LOISE Slocumb, MD.  Subjective:  Patient ID: [name] [name] , female    DOB: 09-19-1950 , 73 y.o.   MRN: [id number]  Chief Complaint  Patient presents with   Advice Only    Patient presents today wanting to discuss visit with endocrinologist. She was seen on 7/16. She would like to go over lab work taken. She states overall the visit went well. She would also like to go over VAS US  Carotid report.    HPI Discussed the use of AI scribe software for clinical note transcription with the patient, who gave verbal consent to proceed.  History of Present Illness Lucynda  MARNITA [name] is a 73 year old female who presents for follow-up on endocrinology lab results. She is accompanied by her husband, Larnell.  Blood work was conducted on September 02, 2023, at [facility] after she stopped taking biotin, which she was using for hair, skin, and nails. She was referred to Endo for further evaluation of hypercalcemia.  Her vitamin D  levels are elevated despite taking a dose every other day, which is over-the-counter. She spends a lot of time walking outside, which may contribute to her vitamin D  levels. Her parathyroid  hormone levels have been normal, although they have fluctuated in the past. Her corrected calcium  levels were normal at the time of the last test.  She has a history of breast cancer and has undergone various treatments, including chemotherapy. She is concerned about the possibility of sarcoidosis, as suggested by one of the vitamin D  tests. She recalls having bronchitis in the past, which could be related to findings of a probable calcified granuloma in her left lung from a 2018 chest x-ray.  Her family history includes a sister with Parkinson's disease and another sister who died from  breast cancer.  She is currently taking a statin for cholesterol management, which has shown improvement in her carotid ultrasound results. She also takes vitamin D  supplements, which she has adjusted to every other day following shoulder surgery due to concerns about calcium  buildup.  No current respiratory symptoms, no significant skin changes, and no recent exacerbations of bronchitis. She enjoys walking outside regularly and is mindful of her vitamin D  intake.   Past Medical History:  Diagnosis Date   Allergy    Arthritis    back & hip- R   Breast cancer (HCC) 04/15/2012   left-biopsy   Breast cancer, right breast (HCC) 05/14/2012   right mastectomy   Bronchitis    Cancer (HCC)    breast   Carotid stenosis, asymptomatic, right 08/03/2023   Chemotherapy-induced cardiomyopathy (HCC)    CHF with unknown LVEF (HCC) 03/09/2013   Diabetes mellitus    Diabetes mellitus without complication (HCC)    GERD (gastroesophageal reflux disease)    pt. experiencing on occasional , h/o og gastroparesis    Heart murmur    Hiatal hernia    Osteopenia    Pneumonia    treated as an outpt.    Sinus problem    Sleep apnea    sleep minimal concern-  along time ago   Ulcer      Family History  Problem Relation Age of Onset   Heart attack Father    Diabetes Father    Heart attack Brother  Diabetes Brother    Diabetes Sister    Breast cancer Sister 35   Diabetes Mother    Breast cancer Mother 49   Parkinson's disease Sister    Heart attack Brother      Current Outpatient Medications:    Ascorbic Acid (VITAMIN C) 500 MG tablet, Take 500 mg by mouth daily., Disp: , Rfl:    Cholecalciferol (VITAMIN D  PO), Take 1 tablet by mouth daily., Disp: , Rfl:    CRESTOR  5 MG tablet, Take 1 tablet (5 mg total) by mouth daily., Disp: 90 tablet, Rfl: 2   dexlansoprazole (DEXILANT) 60 MG capsule, Take 1 capsule by mouth every morning., Disp: , Rfl:    ELDERBERRY PO, Take by mouth., Disp: , Rfl:     glucose blood (CONTOUR NEXT TEST) test strip, USE 1 STRIP TO CHECK GLUCOSE ONCE DAILY, Disp: 100 each, Rfl: 0   KRILL OIL PO, Take 1 capsule by mouth daily. , Disp: , Rfl:    Magnesium Oxide 250 MG TABS, Take 400 mg by mouth daily. , Disp: , Rfl:    Multiple Vitamin (MULTIVITAMIN PO), Take 1 tablet by mouth daily., Disp: , Rfl:    Probiotic Product (PROBIOTIC PO), Take 1 capsule by mouth daily., Disp: , Rfl:    Semaglutide  (RYBELSUS ) 7 MG TABS, Take 1 tablet (7 mg total) by mouth every morning., Disp: 90 tablet, Rfl: 2   Allergies  Allergen Reactions   Darvon Hives and Itching   Penicillins Hives and Itching    Has patient had a PCN reaction causing immediate rash, facial/tongue/throat swelling, SOB or lightheadedness with hypotension: No Has patient had a PCN reaction causing severe rash involving mucus membranes or skin necrosis: No Has patient had a PCN reaction that required hospitalization: No Has patient had a PCN reaction occurring within the last 10 years: No If all of the above answers are NO, then may proceed with Cephalosporin use.    Clindamycin/Lincomycin Other (See Comments)    vomiting     Review of Systems  Constitutional: Negative.   Respiratory: Negative.    Cardiovascular: Negative.   Gastrointestinal: Negative.   Neurological: Negative.   Psychiatric/Behavioral: Negative.       Today's Vitals   09/07/23 1510  BP: 124/80  Pulse: 86  Temp: 98.5 F (36.9 C)  SpO2: 98%  Weight: 167 lb 6.4 oz (75.9 kg)  Height: 5' 4 (1.626 m)   Body mass index is 28.73 kg/m.  Wt Readings from Last 3 Encounters:  09/07/23 167 lb 6.4 oz (75.9 kg)  08/03/23 162 lb (73.5 kg)  07/21/23 166 lb 12.8 oz (75.7 kg)     Objective:  Physical Exam Vitals and nursing note reviewed.  Constitutional:      Appearance: Normal appearance.  HENT:     Head: Normocephalic and atraumatic.  Eyes:     Extraocular Movements: Extraocular movements intact.  Cardiovascular:     Rate  and Rhythm: Normal rate and regular rhythm.     Heart sounds: Normal heart sounds.  Pulmonary:     Effort: Pulmonary effort is normal.     Breath sounds: Normal breath sounds.  Musculoskeletal:     Cervical back: Normal range of motion.  Skin:    General: Skin is warm.  Neurological:     General: No focal deficit present.     Mental Status: She is alert.  Psychiatric:        Mood and Affect: Mood normal.  Behavior: Behavior normal.         Assessment And Plan:  Hypercalcemia Assessment & Plan: Endo notes/labs reviewed. Her input is appreciated.  Elevated vitamin D  levels may contribute to hypercalcemia. Still awaiting her treatment plan.  Corrected calcium  is normal, but fluctuating parathyroid  hormone levels are noted. Sarcoidosis is considered due to elevated vitamin D  1,25-dihydroxy level and a calcified granuloma in the left lung. Differential diagnosis includes sarcoidosis and cancer recurrence. - Discontinue vitamin D  supplementation and recheck calcium  and vitamin D  levels in two months. - Await parathyroid  hormone-related protein and ACE level test results. - Consider pulmonary evaluation referral if sarcoidosis is suspected. - Re-evaluate the need for chest x-ray or CT scan based on test results.   Type 2 diabetes mellitus with hyperlipidemia (HCC) Assessment & Plan: Chronic, this has been stable with Rybelsus .  . - Encourage continued regular physical activity. Regarding hyperlipidemia, LDL goal is less than 70.  Continue with branded Crestor  daily.      Follow-up Awaiting further test results to determine next steps. Coordination with endocrinologist and potential lung specialist referral depend on results. - Check lab results in 2-3 days. - Schedule follow-up in September to reassess calcium  and vitamin D  levels. - Await endocrinologist's input before further decisions. Return in 8 weeks (on 11/02/2023), or dm check.  Patient was given opportunity to ask  questions. Patient verbalized understanding of the plan and was able to repeat key elements of the plan. All questions were answered to their satisfaction.    I, [name] LOISE Slocumb, MD, have reviewed all documentation for this visit. The documentation on 09/07/23 for the exam, diagnosis, procedures, and orders are all accurate and complete.   IF YOU HAVE BEEN REFERRED TO A SPECIALIST, IT MAY TAKE 1-2 WEEKS TO SCHEDULE/PROCESS THE REFERRAL. IF YOU HAVE NOT HEARD FROM US /SPECIALIST IN TWO WEEKS, PLEASE GIVE US  A CALL AT [phone].   THE PATIENT IS ENCOURAGED TO PRACTICE SOCIAL DISTANCING DUE TO THE COVID-19 PANDEMIC.

## 2023-09-13 NOTE — Assessment & Plan Note (Signed)
 Chronic, this has been stable with Rybelsus .  . - Encourage continued regular physical activity. Regarding hyperlipidemia, LDL goal is less than 70.  Continue with branded Crestor  daily.

## 2023-09-13 NOTE — Assessment & Plan Note (Signed)
 Endo notes/labs reviewed. Her input is appreciated.  Elevated vitamin D  levels may contribute to hypercalcemia. Still awaiting her treatment plan.  Corrected calcium  is normal, but fluctuating parathyroid  hormone levels are noted. Sarcoidosis is considered due to elevated vitamin D  1,25-dihydroxy level and a calcified granuloma in the left lung. Differential diagnosis includes sarcoidosis and cancer recurrence. - Discontinue vitamin D  supplementation and recheck calcium  and vitamin D  levels in two months. - Await parathyroid  hormone-related protein and ACE level test results. - Consider pulmonary evaluation referral if sarcoidosis is suspected. - Re-evaluate the need for chest x-ray or CT scan based on test results.

## 2023-11-02 ENCOUNTER — Encounter: Payer: Self-pay | Admitting: Internal Medicine

## 2023-11-02 ENCOUNTER — Ambulatory Visit (INDEPENDENT_AMBULATORY_CARE_PROVIDER_SITE_OTHER): Admitting: Internal Medicine

## 2023-11-02 VITALS — BP 124/82 | HR 67 | Temp 98.1°F | Ht 64.0 in | Wt 165.8 lb

## 2023-11-02 DIAGNOSIS — E2839 Other primary ovarian failure: Secondary | ICD-10-CM | POA: Insufficient documentation

## 2023-11-02 DIAGNOSIS — E1169 Type 2 diabetes mellitus with other specified complication: Secondary | ICD-10-CM | POA: Diagnosis not present

## 2023-11-02 DIAGNOSIS — E782 Mixed hyperlipidemia: Secondary | ICD-10-CM | POA: Diagnosis not present

## 2023-11-02 NOTE — Assessment & Plan Note (Signed)
 Osteopenia monitored. Weight-bearing exercises beneficial. Latest bone density results needed for comparison. - Order bone density scan at [facility]. - Request last bone density results from Dr. Roby office. - Encourage continuation of weight-bearing exercises.

## 2023-11-02 NOTE — Progress Notes (Signed)
 I,[name], CMA,acting as a Neurosurgeon for [name], MD.,have documented all relevant documentation on the behalf of [name], MD,as directed by  [name], MD while in the presence of [name], MD.  Subjective:  Patient ID: [name] [name] , female    DOB: January 11, 1951 , 73 y.o.   MRN: [id number]  Chief Complaint  Patient presents with   Diabetes    Patient presents today for dm follow up. She reports compliance with medications. Denies headache, chest pain & sob.     HPI Discussed the use of AI scribe software for clinical note transcription with the patient, who gave verbal consent to proceed.  History of Present Illness [name] [name] is a 73 year old female who presents for a diabetes follow-up visit.  She is currently tolerating her dose of Rybelsus  7 mg without any issues. No recent stomach issues and good joint health are noted. She maintains an active lifestyle by walking daily and using a treadmill during colder months.  She has a history of osteopenia and engages in walking and some strength training. She is awaiting a bone density scan to monitor her condition. Her last bone density scan documented was in 2015.  She has had all of these procedures performed at her GYN office, [facility].    She has a history of thin hair following chemotherapy and uses over-the-counter biotin for hair, skin, and nails.   She had an ultrasound of her thyroid  in March, which showed no nodules. An ultrasound of her carotids was also performed, showing minimal stenosis on the left side.  She is scheduled to see her gastroenterologist for a Dexilant prescription renewal.   Diabetes She presents for her follow-up diabetic visit. She has type 2 diabetes mellitus. There are no hypoglycemic associated symptoms. There are no diabetic associated symptoms. Pertinent negatives for diabetes include no polydipsia, no polyphagia and no polyuria. There are no diabetic  complications. Risk factors for coronary artery disease include diabetes mellitus, dyslipidemia, hypertension and post-menopausal. She participates in exercise daily. An ACE inhibitor/angiotensin II receptor blocker is not being taken.  Hyperlipidemia This is a chronic problem. The current episode started more than 1 year ago. The problem is controlled. Exacerbating diseases include diabetes. Current antihyperlipidemic treatment includes statins. The current treatment provides moderate improvement of lipids. There are no compliance problems.  Risk factors for coronary artery disease include post-menopausal, diabetes mellitus and dyslipidemia.     Past Medical History:  Diagnosis Date   Allergy    Arthritis    back & hip- R   Breast cancer (HCC) 04/15/2012   left-biopsy   Breast cancer, right breast (HCC) 05/14/2012   right mastectomy   Bronchitis    Cancer (HCC)    breast   Carotid stenosis, asymptomatic, right 08/03/2023   Chemotherapy-induced cardiomyopathy (HCC)    CHF with unknown LVEF (HCC) 03/09/2013   Diabetes mellitus    Diabetes mellitus without complication (HCC)    GERD (gastroesophageal reflux disease)    pt. experiencing on occasional , h/o og gastroparesis    Heart murmur    Hiatal hernia    Osteopenia    Pneumonia    treated as an outpt.    Sinus problem    Sleep apnea    sleep minimal concern-  along time ago   Ulcer      Family History  Problem Relation Age of Onset   Heart attack Father    Diabetes Father  Heart attack Brother    Diabetes Brother    Diabetes Sister    Breast cancer Sister 10   Diabetes Mother    Breast cancer Mother 24   Parkinson's disease Sister    Heart attack Brother      Current Outpatient Medications:    Ascorbic Acid (VITAMIN C) 500 MG tablet, Take 500 mg by mouth daily., Disp: , Rfl:    Cholecalciferol (VITAMIN D  PO), Take 1 tablet by mouth daily., Disp: , Rfl:    CRESTOR  5 MG tablet, Take 1 tablet (5 mg total) by  mouth daily., Disp: 90 tablet, Rfl: 2   dexlansoprazole (DEXILANT) 60 MG capsule, Take 1 capsule by mouth every morning., Disp: , Rfl:    ELDERBERRY PO, Take by mouth., Disp: , Rfl:    glucose blood (CONTOUR NEXT TEST) test strip, USE 1 STRIP TO CHECK GLUCOSE ONCE DAILY, Disp: 100 each, Rfl: 0   KRILL OIL PO, Take 1 capsule by mouth daily. , Disp: , Rfl:    Magnesium Oxide 250 MG TABS, Take 400 mg by mouth daily. , Disp: , Rfl:    Multiple Vitamin (MULTIVITAMIN PO), Take 1 tablet by mouth daily., Disp: , Rfl:    Probiotic Product (PROBIOTIC PO), Take 1 capsule by mouth daily., Disp: , Rfl:    RESTASIS 0.05 % ophthalmic emulsion, 1 drop 2 (two) times daily., Disp: , Rfl:    Semaglutide  (RYBELSUS ) 7 MG TABS, Take 1 tablet (7 mg total) by mouth every morning., Disp: 90 tablet, Rfl: 2   Biotin 1 MG CAPS, Orally, Disp: , Rfl:    Allergies  Allergen Reactions   Darvon Hives and Itching   Penicillins Hives and Itching    Has patient had a PCN reaction causing immediate rash, facial/tongue/throat swelling, SOB or lightheadedness with hypotension: No Has patient had a PCN reaction causing severe rash involving mucus membranes or skin necrosis: No Has patient had a PCN reaction that required hospitalization: No Has patient had a PCN reaction occurring within the last 10 years: No If all of the above answers are NO, then may proceed with Cephalosporin use.    Clindamycin/Lincomycin Other (See Comments)    vomiting     Review of Systems  Constitutional: Negative.   Respiratory: Negative.    Cardiovascular: Negative.   Endocrine: Negative for polydipsia, polyphagia and polyuria.  Neurological: Negative.   Psychiatric/Behavioral: Negative.       Today's Vitals   11/02/23 1445  BP: 124/82  Pulse: 67  Temp: 98.1 F (36.7 C)  SpO2: 98%  Weight: 165 lb 12.8 oz (75.2 kg)  Height: 5' 4 (1.626 m)   Body mass index is 28.46 kg/m.  Wt Readings from Last 3 Encounters:  11/02/23 165 lb 12.8  oz (75.2 kg)  09/07/23 167 lb 6.4 oz (75.9 kg)  08/03/23 162 lb (73.5 kg)     Objective:  Physical Exam Vitals and nursing note reviewed.  Constitutional:      Appearance: Normal appearance.  HENT:     Head: Normocephalic and atraumatic.  Eyes:     Extraocular Movements: Extraocular movements intact.  Cardiovascular:     Rate and Rhythm: Normal rate and regular rhythm.     Heart sounds: Normal heart sounds.  Pulmonary:     Effort: Pulmonary effort is normal.     Breath sounds: Normal breath sounds.  Musculoskeletal:     Cervical back: Normal range of motion.  Skin:    General: Skin is warm.  Neurological:  General: No focal deficit present.     Mental Status: She is alert.  Psychiatric:        Mood and Affect: Mood normal.        Behavior: Behavior normal.         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 No neuropathy or gastrointestinal issues noted. - Continue Rybelsus  7 mg daily. - Check kidney function and A1c today. - She will f/u in 4 months for re-evaluation.   Orders: -     Microalbumin / creatinine urine ratio -     BMP8+EGFR -     Hemoglobin A1c  Hypercalcemia Assessment & Plan: Chronic. Endo input is appreciated.  She is encouraged to keep upcoming appt.  - Check labs as below.  - Continue to avoid vitamin D /calcium  supplementation at this time.   Orders: -     Protein electrophoresis, serum  Estrogen deficiency Assessment & Plan: Osteopenia monitored. Weight-bearing exercises beneficial. Latest bone density results needed for comparison. - Order bone density scan at [facility]. - Request last bone density results from Dr. Roby office. - Encourage continuation of weight-bearing exercises.    Return for 4 month dm f/u.SABRA  Patient was given opportunity to ask questions. Patient verbalized understanding of the plan and was able to  repeat key elements of the plan. All questions were answered to their satisfaction.   I, [name], MD, have reviewed all documentation for this visit. The documentation on 11/02/23 for the exam, diagnosis, procedures, and orders are all accurate and complete.   IF YOU HAVE BEEN REFERRED TO A SPECIALIST, IT MAY TAKE 1-2 WEEKS TO SCHEDULE/PROCESS THE REFERRAL. IF YOU HAVE NOT HEARD FROM US /SPECIALIST IN TWO WEEKS, PLEASE GIVE US  A CALL AT [phone].   THE PATIENT IS ENCOURAGED TO PRACTICE SOCIAL DISTANCING DUE TO THE COVID-19 PANDEMIC.

## 2023-11-02 NOTE — Patient Instructions (Signed)

## 2023-11-02 NOTE — Assessment & Plan Note (Signed)
 Chronic. Endo input is appreciated.  She is encouraged to keep upcoming appt.  - Check labs as below.  - Continue to avoid vitamin D /calcium  supplementation at this time.

## 2023-11-02 NOTE — Assessment & Plan Note (Signed)
 Chronic, diabetes well-managed with Rybelsus  7 mg. No neuropathy or gastrointestinal issues noted. - Continue Rybelsus  7 mg daily. - Check kidney function and A1c today. - She will f/u in 4 months for re-evaluation.

## 2023-11-03 DIAGNOSIS — K219 Gastro-esophageal reflux disease without esophagitis: Secondary | ICD-10-CM | POA: Diagnosis not present

## 2023-11-03 DIAGNOSIS — Z860100 Personal history of colon polyps, unspecified: Secondary | ICD-10-CM | POA: Diagnosis not present

## 2023-11-03 DIAGNOSIS — K5900 Constipation, unspecified: Secondary | ICD-10-CM | POA: Diagnosis not present

## 2023-11-03 LAB — HEMOGLOBIN A1C
Est. average glucose Bld gHb Est-mCnc: 143 mg/dL
Hgb A1c MFr Bld: 6.6 % — ABNORMAL HIGH (ref 4.8–5.6)

## 2023-11-03 LAB — BMP8+EGFR
BUN/Creatinine Ratio: 15 (ref 12–28)
BUN: 8 mg/dL (ref 8–27)
CO2: 25 mmol/L (ref 20–29)
Calcium: 10.5 mg/dL — ABNORMAL HIGH (ref 8.7–10.3)
Chloride: 101 mmol/L (ref 96–106)
Creatinine, Ser: 0.55 mg/dL — ABNORMAL LOW (ref 0.57–1.00)
Glucose: 114 mg/dL — ABNORMAL HIGH (ref 70–99)
Potassium: 4 mmol/L (ref 3.5–5.2)
Sodium: 141 mmol/L (ref 134–144)
eGFR: 97 mL/min/1.73 (ref >59)

## 2023-11-03 LAB — PROTEIN ELECTROPHORESIS, SERUM
A/G Ratio: 1.3 (ref 0.7–1.7)
Albumin ELP: 3.5 g/dL (ref 2.9–4.4)
Alpha 1: 0.3 g/dL (ref 0.0–0.4)
Alpha 2: 0.8 g/dL (ref 0.4–1.0)
Beta: 1 g/dL (ref 0.7–1.3)
Gamma Globulin: 0.7 g/dL (ref 0.4–1.8)
Globulin, Total: 2.8 g/dL (ref 2.2–3.9)
Total Protein: 6.3 g/dL (ref 6.0–8.5)

## 2023-11-03 LAB — MICROALBUMIN / CREATININE URINE RATIO
Creatinine, Urine: 37.5 mg/dL
Microalb/Creat Ratio: 95 mg/g{creat} — ABNORMAL HIGH (ref 0–29)
Microalbumin, Urine: 35.7 ug/mL

## 2023-11-04 ENCOUNTER — Encounter: Payer: Self-pay | Admitting: Internal Medicine

## 2023-11-04 ENCOUNTER — Ambulatory Visit: Payer: Self-pay | Admitting: Internal Medicine

## 2023-11-12 ENCOUNTER — Encounter: Payer: Self-pay | Admitting: Internal Medicine

## 2023-11-12 ENCOUNTER — Ambulatory Visit: Payer: Self-pay | Admitting: Internal Medicine

## 2023-11-12 VITALS — BP 126/80 | HR 78 | Temp 98.1°F | Ht 64.0 in | Wt 165.6 lb

## 2023-11-12 DIAGNOSIS — R809 Proteinuria, unspecified: Secondary | ICD-10-CM | POA: Diagnosis not present

## 2023-11-12 DIAGNOSIS — E1129 Type 2 diabetes mellitus with other diabetic kidney complication: Secondary | ICD-10-CM

## 2023-11-12 DIAGNOSIS — E785 Hyperlipidemia, unspecified: Secondary | ICD-10-CM

## 2023-11-12 DIAGNOSIS — E1169 Type 2 diabetes mellitus with other specified complication: Secondary | ICD-10-CM | POA: Diagnosis not present

## 2023-11-12 LAB — POCT URINALYSIS DIP (CLINITEK)
Bilirubin, UA: NEGATIVE
Blood, UA: NEGATIVE
Glucose, UA: NEGATIVE mg/dL
Ketones, POC UA: NEGATIVE mg/dL
Leukocytes, UA: NEGATIVE
Nitrite, UA: NEGATIVE
POC PROTEIN,UA: NEGATIVE
Spec Grav, UA: 1.01 (ref 1.010–1.025)
Urobilinogen, UA: 0.2 U/dL
pH, UA: 7 (ref 5.0–8.0)

## 2023-11-12 NOTE — Patient Instructions (Signed)

## 2023-11-12 NOTE — Progress Notes (Signed)
 I,[name], CMA,acting as a Neurosurgeon for [name], MD.,have documented all relevant documentation on the behalf of [name], MD,as directed by  [name], MD while in the presence of [name], MD.  Subjective:  Patient ID: [name]  KANDICE [name] , female    DOB: 05/09/1950 , 73 y.o.   MRN: [id number]  Chief Complaint  Patient presents with   Diabetes    Patient presents today wanting to discuss recent labs results. She is concerned about protein being found in her urine. She has done some research on what causes protein in urine. After doing research she has stopped exercising for 24 hours, being she exercised before coming to previous visit. She has also stopped eating meat for . She also wants to discuss [place]. She now has the 10mg  dose given as samples. She admits not taking samples given. She would like to try this method of eliminating protein.    HPI Discussed the use of AI scribe software for clinical note transcription with the patient, who gave verbal consent to proceed.  History of Present Illness [name]  JARIANA [name] is a 73 year old female with diabetes who presents with concerns about proteinuria and its implications on her kidney health.  She is accompanied by her husband.  She is concerned about proteinuria detected in her urine, which she attributes to her diabetes. A recent urinalysis showed protein in her urine, specifically microalbumin. She walked four and a half miles and consumed meat the day before and the morning of the test. A subsequent urinalysis showed no proteins, but she remains concerned about the initial findings.  She read that both of these things can affect her results.   She is apprehensive about starting new medications, specifically Farxiga and Jardiance, due to potential side effects she read about, including UTIs and yeast infections. She has a history of these infections and is particularly worried about the risk of amputation  associated with Jardiance. She is considering starting on a lower dose of Farxiga (5 mg) due to these concerns.  Her current medications include Rybelsus , which she is taking at a dose of 7 mg. Her A1c has been under 7, specifically 6.6, and she is managing her diabetes with this medication. She is also engaging in regular physical activity, including walking and biking, and is mindful of her diet to control her blood sugar levels.   Past Medical History:  Diagnosis Date   Allergy    Arthritis    back & hip- R   Breast cancer (HCC) 04/15/2012   left-biopsy   Breast cancer, right breast (HCC) 05/14/2012   right mastectomy   Bronchitis    Cancer (HCC)    breast   Carotid stenosis, asymptomatic, right 08/03/2023   Chemotherapy-induced cardiomyopathy    CHF with unknown LVEF (HCC) 03/09/2013   Diabetes mellitus    Diabetes mellitus without complication (HCC)    GERD (gastroesophageal reflux disease)    pt. experiencing on occasional , h/o og gastroparesis    Heart murmur    Hiatal hernia    Osteopenia    Pneumonia    treated as an outpt.    Sinus problem    Sleep apnea    sleep minimal concern-  along time ago   Ulcer      Family History  Problem Relation Age of Onset   Heart attack Father    Diabetes Father    Heart attack Brother    Diabetes Brother  Diabetes Sister    Breast cancer Sister 26   Diabetes Mother    Breast cancer Mother 81   Parkinson's disease Sister    Heart attack Brother      Current Outpatient Medications:    Ascorbic Acid (VITAMIN C) 500 MG tablet, Take 500 mg by mouth daily., Disp: , Rfl:    Biotin 1 MG CAPS, Orally, Disp: , Rfl:    Cholecalciferol (VITAMIN D  PO), Take 1 tablet by mouth daily., Disp: , Rfl:    CRESTOR  5 MG tablet, Take 1 tablet (5 mg total) by mouth daily., Disp: 90 tablet, Rfl: 2   dexlansoprazole (DEXILANT) 60 MG capsule, Take 1 capsule by mouth every morning., Disp: , Rfl:    ELDERBERRY PO, Take by mouth., Disp: , Rfl:     glucose blood (CONTOUR NEXT TEST) test strip, USE 1 STRIP TO CHECK GLUCOSE ONCE DAILY, Disp: 100 each, Rfl: 0   KRILL OIL PO, Take 1 capsule by mouth daily. , Disp: , Rfl:    Magnesium Oxide 250 MG TABS, Take 400 mg by mouth daily. , Disp: , Rfl:    Multiple Vitamin (MULTIVITAMIN PO), Take 1 tablet by mouth daily., Disp: , Rfl:    Probiotic Product (PROBIOTIC PO), Take 1 capsule by mouth daily., Disp: , Rfl:    RESTASIS 0.05 % ophthalmic emulsion, 1 drop 2 (two) times daily., Disp: , Rfl:    RYBELSUS  7 MG TABS, TAKE 1 TABLET BY MOUTH IN THE MORNING, Disp: 90 tablet, Rfl: 0   Allergies  Allergen Reactions   Darvon Hives and Itching   Penicillins Hives and Itching    Has patient had a PCN reaction causing immediate rash, facial/tongue/throat swelling, SOB or lightheadedness with hypotension: No Has patient had a PCN reaction causing severe rash involving mucus membranes or skin necrosis: No Has patient had a PCN reaction that required hospitalization: No Has patient had a PCN reaction occurring within the last 10 years: No If all of the above answers are NO, then may proceed with Cephalosporin use.    Clindamycin/Lincomycin Other (See Comments)    vomiting     Review of Systems  Constitutional: Negative.   Respiratory: Negative.    Cardiovascular: Negative.   Gastrointestinal: Negative.   Neurological: Negative.   Psychiatric/Behavioral: Negative.       Today's Vitals   11/12/23 1426  BP: 126/80  Pulse: 78  Temp: 98.1 F (36.7 C)  SpO2: 98%  Weight: 165 lb 9.6 oz (75.1 kg)  Height: 5' 4 (1.626 m)   Body mass index is 28.43 kg/m.  Wt Readings from Last 3 Encounters:  11/12/23 165 lb 9.6 oz (75.1 kg)  11/02/23 165 lb 12.8 oz (75.2 kg)  09/07/23 167 lb 6.4 oz (75.9 kg)    The 10-year ASCVD risk score (Arnett DK, et al., 2019) is: 29.1%   Values used to calculate the score:     Age: 25 years     Clincally relevant sex: Female     Is Non-Hispanic African American:  Yes     Diabetic: Yes     Tobacco smoker: No     Systolic Blood Pressure: 126 mmHg     Is BP treated: No     HDL Cholesterol: 75 mg/dL     Total Cholesterol: 171 mg/dL  Objective:  Physical Exam Vitals and nursing note reviewed.  Constitutional:      Appearance: Normal appearance.  HENT:     Head: Normocephalic and atraumatic.  Eyes:  Extraocular Movements: Extraocular movements intact.  Cardiovascular:     Rate and Rhythm: Normal rate and regular rhythm.     Heart sounds: Normal heart sounds.  Pulmonary:     Effort: Pulmonary effort is normal.     Breath sounds: Normal breath sounds.  Musculoskeletal:     Cervical back: Normal range of motion.  Skin:    General: Skin is warm.  Neurological:     General: No focal deficit present.     Mental Status: She is alert.  Psychiatric:        Mood and Affect: Mood normal.        Behavior: Behavior normal.         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 Normal kidney function with GFR in the 90s, stage 1 kidney disease. Discussed delaying progression and medication options. Prefers endocrinologist's input before starting Farxiga. - Send urine sample for microalbumin test. - Provide samples of Farxiga 5 mg. - Instruct to discuss medication options with endocrinologist, Dr. Braulio, in October. - Print lab results for her to share with Dr. Braulio.  Orders: -     POCT URINALYSIS DIP (CLINITEK) -     Microalbumin / creatinine urine ratio  Microalbuminuria due to type 2 diabetes mellitus (HCC) Assessment & Plan: She requests repeat testing. Will check microalbumin/creatinine ratio as requested.  - She does not wish to start SGLT2i at this time.  - Plans to discuss further with Endo    Return if symptoms worsen or fail to improve.  Patient was given opportunity to ask questions. Patient verbalized understanding of  the plan and was able to repeat key elements of the plan. All questions were answered to their satisfaction.    I, [name], MD, have reviewed all documentation for this visit. The documentation on 11/18/23 for the exam, diagnosis, procedures, and orders are all accurate and complete.   IF YOU HAVE BEEN REFERRED TO A SPECIALIST, IT MAY TAKE 1-2 WEEKS TO SCHEDULE/PROCESS THE REFERRAL. IF YOU HAVE NOT HEARD FROM US /SPECIALIST IN TWO WEEKS, PLEASE GIVE US  A CALL AT [phone].

## 2023-11-14 ENCOUNTER — Ambulatory Visit: Payer: Self-pay | Admitting: Internal Medicine

## 2023-11-15 ENCOUNTER — Other Ambulatory Visit: Payer: Self-pay | Admitting: Internal Medicine

## 2023-11-18 ENCOUNTER — Encounter: Payer: Self-pay | Admitting: Internal Medicine

## 2023-11-18 DIAGNOSIS — E1129 Type 2 diabetes mellitus with other diabetic kidney complication: Secondary | ICD-10-CM | POA: Insufficient documentation

## 2023-11-18 NOTE — Assessment & Plan Note (Addendum)
 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 Normal kidney function with GFR in the 90s, stage 1 kidney disease. Discussed delaying progression and medication options. Prefers endocrinologist's input before starting Farxiga. - Send urine sample for microalbumin test. - Provide samples of Farxiga 5 mg. - Instruct to discuss medication options with endocrinologist, Dr. Braulio, in October. - Print lab results for her to share with Dr. Braulio.

## 2023-11-18 NOTE — Assessment & Plan Note (Signed)
 She requests repeat testing. Will check microalbumin/creatinine ratio as requested.  - She does not wish to start SGLT2i at this time.  - Plans to discuss further with Endo

## 2023-11-30 DIAGNOSIS — Z23 Encounter for immunization: Secondary | ICD-10-CM | POA: Diagnosis not present

## 2023-12-01 DIAGNOSIS — M8580 Other specified disorders of bone density and structure, unspecified site: Secondary | ICD-10-CM | POA: Diagnosis not present

## 2023-12-01 DIAGNOSIS — R809 Proteinuria, unspecified: Secondary | ICD-10-CM | POA: Diagnosis not present

## 2023-12-01 DIAGNOSIS — E559 Vitamin D deficiency, unspecified: Secondary | ICD-10-CM | POA: Diagnosis not present

## 2023-12-01 DIAGNOSIS — E119 Type 2 diabetes mellitus without complications: Secondary | ICD-10-CM | POA: Diagnosis not present

## 2023-12-01 DIAGNOSIS — E1169 Type 2 diabetes mellitus with other specified complication: Secondary | ICD-10-CM | POA: Diagnosis not present

## 2023-12-02 DIAGNOSIS — R809 Proteinuria, unspecified: Secondary | ICD-10-CM | POA: Diagnosis not present

## 2023-12-07 ENCOUNTER — Ambulatory Visit: Admitting: Internal Medicine

## 2023-12-07 DIAGNOSIS — Z01419 Encounter for gynecological examination (general) (routine) without abnormal findings: Secondary | ICD-10-CM | POA: Diagnosis not present

## 2023-12-07 DIAGNOSIS — Z124 Encounter for screening for malignant neoplasm of cervix: Secondary | ICD-10-CM | POA: Diagnosis not present

## 2023-12-21 DIAGNOSIS — Z23 Encounter for immunization: Secondary | ICD-10-CM | POA: Diagnosis not present

## 2024-01-20 DIAGNOSIS — E119 Type 2 diabetes mellitus without complications: Secondary | ICD-10-CM | POA: Diagnosis not present

## 2024-01-20 DIAGNOSIS — H40013 Open angle with borderline findings, low risk, bilateral: Secondary | ICD-10-CM | POA: Diagnosis not present

## 2024-01-20 LAB — OPHTHALMOLOGY REPORT-SCANNED

## 2024-01-25 ENCOUNTER — Encounter (HOSPITAL_BASED_OUTPATIENT_CLINIC_OR_DEPARTMENT_OTHER): Payer: Self-pay

## 2024-01-25 ENCOUNTER — Ambulatory Visit: Payer: Self-pay

## 2024-01-25 ENCOUNTER — Emergency Department (HOSPITAL_BASED_OUTPATIENT_CLINIC_OR_DEPARTMENT_OTHER): Admitting: Radiology

## 2024-01-25 ENCOUNTER — Ambulatory Visit: Admitting: Family Medicine

## 2024-01-25 ENCOUNTER — Other Ambulatory Visit: Payer: Self-pay

## 2024-01-25 ENCOUNTER — Emergency Department (HOSPITAL_BASED_OUTPATIENT_CLINIC_OR_DEPARTMENT_OTHER)
Admission: EM | Admit: 2024-01-25 | Discharge: 2024-01-25 | Disposition: A | Discharge status: Home / Self Care | Attending: Emergency Medicine | Admitting: Emergency Medicine

## 2024-01-25 ENCOUNTER — Emergency Department (HOSPITAL_BASED_OUTPATIENT_CLINIC_OR_DEPARTMENT_OTHER)

## 2024-01-25 DIAGNOSIS — R918 Other nonspecific abnormal finding of lung field: Secondary | ICD-10-CM | POA: Diagnosis not present

## 2024-01-25 DIAGNOSIS — R0781 Pleurodynia: Secondary | ICD-10-CM

## 2024-01-25 DIAGNOSIS — I509 Heart failure, unspecified: Secondary | ICD-10-CM | POA: Diagnosis not present

## 2024-01-25 DIAGNOSIS — Z853 Personal history of malignant neoplasm of breast: Secondary | ICD-10-CM | POA: Diagnosis not present

## 2024-01-25 DIAGNOSIS — J90 Pleural effusion, not elsewhere classified: Secondary | ICD-10-CM | POA: Diagnosis not present

## 2024-01-25 DIAGNOSIS — R0602 Shortness of breath: Secondary | ICD-10-CM | POA: Diagnosis not present

## 2024-01-25 DIAGNOSIS — I700 Atherosclerosis of aorta: Secondary | ICD-10-CM | POA: Diagnosis not present

## 2024-01-25 DIAGNOSIS — R079 Chest pain, unspecified: Secondary | ICD-10-CM | POA: Diagnosis not present

## 2024-01-25 DIAGNOSIS — E119 Type 2 diabetes mellitus without complications: Secondary | ICD-10-CM | POA: Diagnosis not present

## 2024-01-25 LAB — BASIC METABOLIC PANEL WITH GFR
Anion gap: 9 (ref 5–15)
BUN: 8 mg/dL (ref 8–23)
CO2: 30 mmol/L (ref 22–32)
Calcium: 10.9 mg/dL — ABNORMAL HIGH (ref 8.9–10.3)
Chloride: 103 mmol/L (ref 98–111)
Creatinine, Ser: 0.58 mg/dL (ref 0.44–1.00)
GFR, Estimated: >60 mL/min (ref >60)
Glucose, Bld: 163 mg/dL — ABNORMAL HIGH (ref 70–99)
Potassium: 4.1 mmol/L (ref 3.5–5.1)
Sodium: 142 mmol/L (ref 135–145)

## 2024-01-25 LAB — CBC
HCT: 42.1 % (ref 36.0–46.0)
Hemoglobin: 14.3 g/dL (ref 12.0–15.0)
MCH: 31 pg (ref 26.0–34.0)
MCHC: 34 g/dL (ref 30.0–36.0)
MCV: 91.3 fL (ref 80.0–100.0)
Platelets: 271 K/uL (ref 150–400)
RBC: 4.61 MIL/uL (ref 3.87–5.11)
RDW: 12.6 % (ref 11.5–15.5)
WBC: 5.9 K/uL (ref 4.0–10.5)
nRBC: 0 % (ref 0.0–0.2)

## 2024-01-25 LAB — PRO BRAIN NATRIURETIC PEPTIDE: Pro Brain Natriuretic Peptide: 92.9 pg/mL (ref <300.0)

## 2024-01-25 LAB — TROPONIN T, HIGH SENSITIVITY
Troponin T High Sensitivity: <15 ng/L (ref 0–19)
Troponin T High Sensitivity: <15 ng/L (ref 0–19)

## 2024-01-25 MED ORDER — IOHEXOL 350 MG/ML SOLN
100.0000 mL | Freq: Once | INTRAVENOUS | Status: AC | PRN
  Administered 2024-01-25: 75 mL via INTRAVENOUS

## 2024-01-25 NOTE — ED Triage Notes (Signed)
 Left chest pain with inspiration/exhalation/ and movement onset > 1 week ago. SOB with episodes due to pain. Feels like a catch in her chest. Denies cough, nausea, or vomiting. Denies known injury.

## 2024-01-25 NOTE — Telephone Encounter (Signed)
 FYI Only or Action Required?: FYI only for provider: appointment scheduled on today.  Patient was last seen in primary care on 11/12/2023 by Jarold Medici, MD.  Called Nurse Triage reporting Chest Pain.  Symptoms began a week ago.  Interventions attempted: Nothing.  Symptoms are: unchanged.  Triage Disposition: see HCP within the next hour.   Patient/caregiver understands and will follow disposition?: Yes, will follow disposition  Copied from CRM [id number]. Topic: Clinical - Red Word Triage >> Jan 25, 2024 10:20 AM [name] [name] wrote: Red Word that prompted transfer to Nurse Triage: breathing issue hard to breath and pain in the left side of her chest and back Reason for Disposition  Taking a deep breath makes pain worse  Answer Assessment - Initial Assessment Questions 1. LOCATION: Where does it hurt?       CP L 2. RADIATION: Does the pain go anywhere else? (e.g., into neck, jaw, arms, back)     Back,  3. ONSET: When did the chest pain begin? (Minutes, hours or days)      About a week ago 4. PATTERN: Does the pain come and go, or has it been constant since it started?  Does it get worse with exertion?    Intermittent, caused by movement, deep breathing 6. SEVERITY: How bad is the pain?  (e.g., Scale 1-10; mild, moderate, or severe)     7 7. CARDIAC RISK FACTORS: Do you have any history of heart problems or risk factors for heart disease? (e.g., angina, prior heart attack; diabetes, high blood pressure, high cholesterol, smoker, or strong family history of heart disease)     denies 8. PULMONARY RISK FACTORS: Do you have any history of lung disease?  (e.g., blood clots in lung, asthma, emphysema, birth control pills)     denies 9. CAUSE: What do you think is causing the chest pain?     unsure 10. OTHER SYMPTOMS: Do you have any other symptoms? (e.g., dizziness, nausea, vomiting, sweating, fever, difficulty breathing, cough)       denies   Denies long trips,  denies hx of blood clots. Denies injury to her chest.  Protocols used: Chest Pain-A-AH

## 2024-01-25 NOTE — ED Notes (Signed)
Lab called to add on pro-BNP

## 2024-01-25 NOTE — ED Provider Notes (Signed)
 [place] EMERGENCY DEPARTMENT AT [facility] Provider Note   CSN: [id number] Arrival date & time: 01/25/24  1142     Patient presents with: Chest Pain   [name]  FELICITA [name] is a 73 y.o. female with history of breast cancer status post chemo and bilateral mastectomies presents with complaints of pleuritic chest pain x 1 week.  Denies any URI symptoms.  Denies any shortness of breath to me during my exam.  No history of PE.  She is not on blood thinners.  No cardiac issues.    Chest Pain     Past Medical History:  Diagnosis Date   Allergy    Arthritis    back & hip- R   Breast cancer (HCC) 04/15/2012   left-biopsy   Breast cancer, right breast (HCC) 05/14/2012   right mastectomy   Bronchitis    Cancer (HCC)    breast   Carotid stenosis, asymptomatic, right 08/03/2023   Chemotherapy-induced cardiomyopathy    CHF with unknown LVEF (HCC) 03/09/2013   Diabetes mellitus    Diabetes mellitus without complication (HCC)    GERD (gastroesophageal reflux disease)    pt. experiencing on occasional , h/o og gastroparesis    Heart murmur    Hiatal hernia    Osteopenia    Pneumonia    treated as an outpt.    Sinus problem    Sleep apnea    sleep minimal concern-  along time ago   Ulcer    Past Surgical History:  Procedure Laterality Date   AXILLARY SENTINEL NODE BIOPSY Left 05/14/2012   Procedure: AXILLARY SENTINEL NODE BIOPSY;  Surgeon: Elon CHRISTELLA Pacini, MD;  Location: [facility] OR;  Service: General;  Laterality: Left;   BREAST RECONSTRUCTION WITH PLACEMENT OF TISSUE EXPANDER AND FLEX HD (ACELLULAR HYDRATED DERMIS) Bilateral 05/14/2012   Procedure: BREAST RECONSTRUCTION WITH PLACEMENT OF TISSUE EXPANDER AND FLEX HD (ACELLULAR HYDRATED DERMIS);  Surgeon: Alm Sick, MD;  Location: [facility] OR;  Service: Plastics;  Laterality: Bilateral;   BREAST SURGERY     BUNIONECTOMY Bilateral    CESAREAN SECTION  1981, 1983   FOOT SURGERY     GASTRIC BYPASS     1st surgery- at [facility], then an emergent surgery here  [facility]- for perforation of stomach & then a surgery for debridement    JOINT REPLACEMENT     PORT-A-CATH REMOVAL Right 09/12/2013   Procedure: REMOVAL PORT-A-CATH;  Surgeon: Elon CHRISTELLA Pacini, MD;  Location: [facility];  Service: General;  Laterality: Right;   PORTACATH PLACEMENT N/A 05/14/2012   Procedure: INSERTION PORT-A-CATH;  Surgeon: Elon CHRISTELLA Pacini, MD;  Location: [facility] OR;  Service: General;  Laterality: N/A;   SHOULDER SURGERY Left 06/2019   TONSILLECTOMY     TOTAL HIP ARTHROPLASTY Right 03/02/2017   TOTAL HIP ARTHROPLASTY Right 03/02/2017   Procedure: RIGHT TOTAL HIP ARTHROPLASTY ANTERIOR APPROACH;  Surgeon: Liam Lerner, MD;  Location: [facility] OR;  Service: Orthopedics;  Laterality: Right;   TOTAL MASTECTOMY Bilateral 05/14/2012   Procedure: TOTAL MASTECTOMY;  Surgeon: Elon CHRISTELLA Pacini, MD;  Location: [facility] OR;  Service: General;  Laterality: Bilateral;   tummy tuck  1999   WOUND DEBRIDEMENT  01/14/2011   Procedure: DEBRIDEMENT ABDOMINAL WOUND;  Surgeon: Elon CHRISTELLA Pacini, MD;  Location: [facility];  Service: General;  Laterality: N/A;  wound debridement and debridement on the abdomen     Prior to Admission medications   Medication Sig Start Date End Date Taking? Authorizing Provider  Ascorbic  Acid (VITAMIN C) 500 MG tablet Take 500 mg by mouth daily.    [provider]  Biotin 1 MG CAPS Orally    [provider]  Cholecalciferol (VITAMIN D  PO) Take 1 tablet by mouth daily.    [provider]  CRESTOR  5 MG tablet Take 1 tablet (5 mg total) by mouth daily. 07/23/23   Jarold Medici, MD  dexlansoprazole [facility]) 60 MG capsule Take 1 capsule by mouth every morning. 11/11/21   [provider]  ELDERBERRY PO Take by mouth.    [provider]  glucose blood (CONTOUR NEXT TEST) test strip USE 1 STRIP TO CHECK GLUCOSE ONCE DAILY 05/15/22   Jarold Medici, MD  KRILL OIL PO Take 1 capsule by  mouth daily.     [provider]  Magnesium Oxide 250 MG TABS Take 400 mg by mouth daily.     [provider]  Multiple Vitamin (MULTIVITAMIN PO) Take 1 tablet by mouth daily.    [provider]  Probiotic Product (PROBIOTIC PO) Take 1 capsule by mouth daily.    [provider]  RESTASIS 0.05 % ophthalmic emulsion 1 drop 2 (two) times daily. 05/19/23   [provider]  RYBELSUS  7 MG TABS TAKE 1 TABLET BY MOUTH IN THE MORNING 11/16/23   Jarold Medici, MD    Allergies: Darvon, Penicillins, and Clindamycin/lincomycin    Review of Systems  Cardiovascular:  Positive for chest pain.    Updated Vital Signs BP (!) 152/67   Pulse 74   Temp 97.6 F (36.4 C) (Temporal)   Resp 15   Ht 5' 4 (1.626 m)   Wt 73.5 kg   SpO2 100%   BMI 27.81 kg/m   Physical Exam Vitals and nursing note reviewed.  Constitutional:      General: She is not in acute distress.    Appearance: She is well-developed.  HENT:     Head: Normocephalic and atraumatic.  Eyes:     Conjunctiva/sclera: Conjunctivae normal.  Cardiovascular:     Rate and Rhythm: Normal rate and regular rhythm.     Heart sounds: No murmur heard. Pulmonary:     Effort: Pulmonary effort is normal. No respiratory distress.     Breath sounds: Normal breath sounds.  Abdominal:     Palpations: Abdomen is soft.     Tenderness: There is no abdominal tenderness.  Musculoskeletal:        General: No swelling.     Cervical back: Neck supple.     Comments: Status postmastectomy, no lymphadenopathy, overlying erythema or warmth, tolerates full shoulder range of motion  Skin:    General: Skin is warm and dry.     Capillary Refill: Capillary refill takes less than 2 seconds.  Neurological:     Mental Status: She is alert.  Psychiatric:        Mood and Affect: Mood normal.     (all labs ordered are listed, but only abnormal results are displayed) Labs Reviewed  BASIC METABOLIC PANEL WITH GFR -  Abnormal; Notable for the following components:      Result Value   Glucose, Bld 163 (*)    Calcium  10.9 (*)    All other components within normal limits  CBC  PRO BRAIN NATRIURETIC PEPTIDE  TROPONIN T, HIGH SENSITIVITY  TROPONIN T, HIGH SENSITIVITY    EKG: EKG Interpretation Date/Time:  Monday January 25 2024 11:53:00 EST Ventricular Rate:  89 PR Interval:    QRS Duration:  164  QT Interval:  342 QTC Calculation: 416 R Axis:   87  Text Interpretation: Sinus rhythm Confirmed by Jerrol Agent ([id number]) on 01/25/2024 12:46:40 PM  Radiology: CT Angio Chest PE W and/or Wo Contrast Result Date: 01/25/2024 CLINICAL DATA:  Left chest pain, shortness of breath. EXAM: CT ANGIOGRAPHY CHEST WITH CONTRAST TECHNIQUE: Multidetector CT imaging of the chest was performed using the standard protocol during bolus administration of intravenous contrast. Multiplanar CT image reconstructions and MIPs were obtained to evaluate the vascular anatomy. RADIATION DOSE REDUCTION: This exam was performed according to the departmental dose-optimization program which includes automated exposure control, adjustment of the mA and/or kV according to patient size and/or use of iterative reconstruction technique. CONTRAST:  75mL OMNIPAQUE  IOHEXOL  350 MG/ML SOLN COMPARISON:  07/15/2013. FINDINGS: Cardiovascular: Negative for pulmonary embolus. Atherosclerotic calcification of the aorta and coronary arteries. Heart is enlarged. No pericardial effusion. Mediastinum/Nodes: No pathologically enlarged mediastinal, hilar or axillary lymph nodes. Surgical clips in the left axilla. Esophagus is grossly unremarkable. Lungs/Pleura: 8 mm nodular lesion in the posterior right lower lobe (6/78). 3 mm apical left upper lobe nodule (6/25). Small left pleural effusion with minimal compressive atelectasis in the left lower lobe. Airway is unremarkable. Upper Abdomen: Probable small bilateral adrenal adenomas. No specific follow-up necessary.  Gastric bypass. Visualized portions of the liver, gallbladder, adrenal glands, kidneys, spleen, pancreas, stomach and bowel are otherwise grossly unremarkable. No upper abdominal adenopathy. Musculoskeletal: Degenerative changes in the spine. Review of the MIP images confirms the above findings. IMPRESSION: 1. Negative for pulmonary embolus. 2. Small left pleural effusion. 3. Pulmonary nodules measure up to 8 mm in the right lower lobe. Non-contrast chest CT at 3-6 months is recommended. If the nodules are stable at time of repeat CT, then future CT at 18-24 months (from today's scan) is considered optional for low-risk patients, but is recommended for high-risk patients. This recommendation follows the consensus statement: Guidelines for Management of Incidental Pulmonary Nodules Detected on CT Images: From the Fleischner Society 2017; Radiology 2017; 284:228-243. 4. Probable small bilateral adrenal adenomas. 5. Aortic atherosclerosis (ICD10-I70.0). Coronary artery calcification. Electronically Signed   By: Newell Eke M.D.   On: 01/25/2024 13:58   DG Chest 2 View Result Date: 01/25/2024 EXAM: 2 VIEW(S) XRAY OF THE CHEST 01/25/2024 12:23:00 PM COMPARISON: 03/02/2017 CLINICAL HISTORY: chest pain FINDINGS: LUNGS AND PLEURA: Small left pleural effusion with left basilar opacities. No pneumothorax. HEART AND MEDIASTINUM: Calcified aorta. No acute abnormality of the cardiac silhouette. BONES AND SOFT TISSUES: Left axillary surgical clips. Thoracic degenerative changes. IMPRESSION: 1. Small left pleural effusion with left basilar opacities, likely atelectasis versus infection. Electronically signed by: Donnice Mania MD 01/25/2024 01:00 PM EST RP Workstation: [id number]     Procedures   Medications Ordered in the ED  iohexol  (OMNIPAQUE ) 350 MG/ML injection 100 mL (75 mLs Intravenous Contrast Given 01/25/24 1319)    Clinical Course as of 01/25/24 1519  Mon Jan 25, 2024  1258 Patient with history of breast  cancer evaluated for pleuritic chest pain x 1 week.  Upon arrival she is hemodynamically stable.  Her exam is benign.  Will obtain cardiac workup and CT PE study. [JT]  1304 CBC Unremarkable [JT]  1304 Basic metabolic panel(!) Hypercalcemia of 10.9, has known malignancy [JT]  1305 Troponin T, High Sensitivity Within normal limits [JT]  1305 DG Chest 2 View Small left pleural effusion and basilar opacities likely atelectasis [JT]  1411 CT Angio Chest PE W and/or Wo Contrast Negative for PE, small left  pleural effusion, pulmonary nodules measuring up to 8 mm in the right lower lobe [JT]  1442 Workup is overall reassuring.  [place] CT findings with patient and her husband.  Recommended trial of Tylenol  and ibuprofen  over the next week as costochondritis is additionally on the differential.  She has an appointment with her oncology team.  She will contact them for earlier appointment.  Encouraged her to follow-up with her PCP as well.  Strict return precautions provided.  Patient is understanding and agreement with plan. [JT]    Clinical Course User Index [JT] Donnajean Lynwood DEL, PA-C                                 Medical Decision Making Amount and/or Complexity of Data Reviewed Labs: ordered. Decision-making details documented in ED Course. Radiology: ordered. Decision-making details documented in ED Course.  Risk Prescription drug management.   This patient presents to the ED with chief complaint(s) of chest pain.  The complaint involves an extensive differential diagnosis and also carries with it a high risk of complications and morbidity.   Pertinent past medical history as listed in HPI  The differential diagnosis includes  Do not suspect ACS as patient is without any EKG changes or elevated troponin.  CT is negative for PE or pneumonia.  No evidence of pneumothorax.  Based off exam do not suspect septic joint, shingles, abscess or cellulitis. Additional history  obtained: Additional history obtained from spouse Records reviewed Care Everywhere/External Records  Disposition:   Patient will be discharged home. The patient has been appropriately medically screened and/or stabilized in the ED. I have low suspicion for any other emergent medical condition which would require further screening, evaluation or treatment in the ED or require inpatient management. At time of discharge the patient is hemodynamically stable and in no acute distress. I have discussed work-up results and diagnosis with patient and answered all questions. Patient is agreeable with discharge plan. We discussed strict return precautions for returning to the emergency department and they verbalized understanding.     Social Determinants of Health:   none  This note was dictated with voice recognition software.  Despite best efforts at proofreading, errors may have occurred which can change the documentation meaning.       Final diagnoses:  Pleuritic chest pain    ED Discharge Orders     None          Donnajean Lynwood DEL DEVONNA 01/25/24 1519    Jerrol Lynwood, MD 01/25/24 [id number]

## 2024-01-25 NOTE — Discharge Instructions (Addendum)
 You were evaluated in the emergency room for chest pain.  Your lab work did not show any significant abnormality.  There was some evidence of a trace amount of fluid in your lungs as well as a nodule in your lungs.  Please follow-up with your oncology team.  Additionally recommend following up with your primary care doctor as well.  You may use Tylenol  1000 mg every 4-6 hours up to 4000 mg a day and/or ibuprofen  600 mg every 4-6 hours up to 3200 mg on a full stomach.  If you experience any new or worsening symptoms please return to the emergency room.

## 2024-02-03 DIAGNOSIS — E559 Vitamin D deficiency, unspecified: Secondary | ICD-10-CM | POA: Diagnosis not present

## 2024-02-03 DIAGNOSIS — E041 Nontoxic single thyroid nodule: Secondary | ICD-10-CM | POA: Diagnosis not present

## 2024-02-16 ENCOUNTER — Other Ambulatory Visit: Payer: Self-pay | Admitting: Internal Medicine

## 2024-03-03 ENCOUNTER — Ambulatory Visit: Payer: Self-pay | Admitting: Internal Medicine

## 2024-03-03 ENCOUNTER — Encounter: Payer: Self-pay | Admitting: Internal Medicine

## 2024-03-03 VITALS — BP 130/78 | HR 56 | Temp 98.3°F | Ht 64.0 in | Wt 169.8 lb

## 2024-03-03 DIAGNOSIS — R911 Solitary pulmonary nodule: Secondary | ICD-10-CM

## 2024-03-03 DIAGNOSIS — L659 Nonscarring hair loss, unspecified: Secondary | ICD-10-CM | POA: Diagnosis not present

## 2024-03-03 DIAGNOSIS — E1169 Type 2 diabetes mellitus with other specified complication: Secondary | ICD-10-CM | POA: Diagnosis not present

## 2024-03-03 DIAGNOSIS — K5909 Other constipation: Secondary | ICD-10-CM | POA: Diagnosis not present

## 2024-03-03 DIAGNOSIS — E2839 Other primary ovarian failure: Secondary | ICD-10-CM | POA: Diagnosis not present

## 2024-03-03 DIAGNOSIS — E782 Mixed hyperlipidemia: Secondary | ICD-10-CM | POA: Diagnosis not present

## 2024-03-03 MED ORDER — RYBELSUS 7 MG PO TABS: 1.0000 | ORAL_TABLET | Freq: Every morning | ORAL | 2 refills | Status: AC

## 2024-03-03 MED ORDER — LINACLOTIDE 72 MCG PO CAPS: 72.0000 ug | ORAL_CAPSULE | Freq: Every day | ORAL | 0 refills | Status: AC

## 2024-03-03 NOTE — Progress Notes (Signed)
 I,[name], CMA,acting as a neurosurgeon for [name], MD.,have documented all relevant documentation on the behalf of [name], MD,as directed by  [name], MD while in the presence of [name], MD.  Subjective:  Patient ID: [name]  KANDICE [name] , female    DOB: [birth year] , 74 y.o.   MRN: [id number]  Chief Complaint  Patient presents with   Diabetes    She presents today for diabetes & chol check. She reports compliance with meds. Denies headache, chest pain & sob.she has no specific questions or concerns.     HPI Discussed the use of AI scribe software for clinical note transcription with the patient, who gave verbal consent to proceed.  History of Present Illness [name]  KANDICE [name] is a 74 year old female with diabetes who presents for a routine diabetes check.  She has a history of diabetes and her recent blood work showed a slightly elevated calcium  level, with a corrected calcium  of 10.39. Parathyroid  hormone levels were normal. She has stopped taking vitamin D  and calcium  supplements for some time. She takes a multivitamin and a biotin supplement for hair, skin, and nails, which may contain extra calcium . She consumes Greek yogurt regularly.  She has a history of sarcoidosis, but recent tests for this condition came back normal. Her kidney function is reported to be fine.  She experiences sluggish bowel movements, which she attributes to her current medications and supplements. She has tried Miralax  and Colace but finds them ineffective.  She maintains a routine of weight-bearing exercises, walking three to four miles daily either on a treadmill or outside. She expresses concern about undergoing further x-rays due to fear of cancer recurrence.  Her husband recently experienced a diverticulitis episode with bleeding, which resolved with rest and fluid intake. He also had a cold following this episode. She plans to travel next week to assist with her  grandchildren.   Diabetes She presents for her follow-up diabetic visit. She has type 2 diabetes mellitus. There are no hypoglycemic associated symptoms. There are no diabetic associated symptoms. Pertinent negatives for diabetes include no polydipsia, no polyphagia and no polyuria. There are no diabetic complications. Risk factors for coronary artery disease include diabetes mellitus, dyslipidemia, hypertension and post-menopausal. She participates in exercise daily. An ACE inhibitor/angiotensin II receptor blocker is not being taken.  Hyperlipidemia This is a chronic problem. The current episode started more than 1 year ago. The problem is controlled. Exacerbating diseases include diabetes. Current antihyperlipidemic treatment includes statins. The current treatment provides moderate improvement of lipids. There are no compliance problems.  Risk factors for coronary artery disease include post-menopausal, diabetes mellitus and dyslipidemia.     Past Medical History:  Diagnosis Date   Allergy    Arthritis    back & hip- R   Breast cancer (HCC) 04/15/2012   left-biopsy   Breast cancer, right breast (HCC) 05/14/2012   right mastectomy   Bronchitis    Cancer (HCC)    breast   Carotid stenosis, asymptomatic, right 08/03/2023   Chemotherapy-induced cardiomyopathy    CHF with unknown LVEF (HCC) 03/09/2013   Diabetes mellitus    Diabetes mellitus without complication (HCC)    GERD (gastroesophageal reflux disease)    pt. experiencing on occasional , h/o og gastroparesis    Heart murmur    Hiatal hernia    Osteopenia    Pneumonia    treated as an outpt.    Sinus problem    Sleep  apnea    sleep minimal concern-  along time ago   Ulcer      Family History  Adopted: Yes  Problem Relation Age of Onset   Diabetes Mother    Breast cancer Mother 77   Heart attack Father    Diabetes Father    Diabetes Sister    Breast cancer Sister 87   Parkinson's disease Sister    Heart attack  Brother    Diabetes Brother    Heart attack Brother      Current Outpatient Medications:    linaclotide  (LINZESS ) 72 MCG capsule, Take 1 capsule (72 mcg total) by mouth daily before breakfast., Disp: 30 capsule, Rfl: 0   Ascorbic Acid (VITAMIN C) 500 MG tablet, Take 500 mg by mouth daily., Disp: , Rfl:    Biotin 1 MG CAPS, Orally, Disp: , Rfl:    Cholecalciferol (VITAMIN D  PO), Take 1 tablet by mouth daily., Disp: , Rfl:    CRESTOR  5 MG tablet, Take 1 tablet (5 mg total) by mouth daily., Disp: 90 tablet, Rfl: 2   dexlansoprazole (DEXILANT) 60 MG capsule, Take 1 capsule by mouth every morning., Disp: , Rfl:    ELDERBERRY PO, Take by mouth., Disp: , Rfl:    glucose blood (CONTOUR NEXT TEST) test strip, USE 1 STRIP TO CHECK GLUCOSE ONCE DAILY, Disp: 100 each, Rfl: 0   KRILL OIL PO, Take 1 capsule by mouth daily. , Disp: , Rfl:    Magnesium Oxide 250 MG TABS, Take 400 mg by mouth daily. , Disp: , Rfl:    Multiple Vitamin (MULTIVITAMIN PO), Take 1 tablet by mouth daily., Disp: , Rfl:    Probiotic Product (PROBIOTIC PO), Take 1 capsule by mouth daily., Disp: , Rfl:    RESTASIS 0.05 % ophthalmic emulsion, 1 drop 2 (two) times daily., Disp: , Rfl:    Semaglutide  (RYBELSUS ) 7 MG TABS, Take 1 tablet (7 mg total) by mouth every morning., Disp: 90 tablet, Rfl: 2   Allergies  Allergen Reactions   Darvon Hives and Itching   Penicillins Hives and Itching    Has patient had a PCN reaction causing immediate rash, facial/tongue/throat swelling, SOB or lightheadedness with hypotension: No Has patient had a PCN reaction causing severe rash involving mucus membranes or skin necrosis: No Has patient had a PCN reaction that required hospitalization: No Has patient had a PCN reaction occurring within the last 10 years: No If all of the above answers are NO, then may proceed with Cephalosporin use.    Clindamycin/Lincomycin Other (See Comments)    vomiting     Review of Systems  Constitutional:  Negative.   Respiratory: Negative.    Cardiovascular: Negative.   Gastrointestinal: Negative.   Endocrine: Negative for polydipsia, polyphagia and polyuria.  Neurological: Negative.   Psychiatric/Behavioral: Negative.       Today's Vitals   03/03/24 1458  BP: 130/78  Pulse: (!) 56  Temp: 98.3 F (36.8 C)  SpO2: 98%  Weight: 169 lb 12.8 oz (77 kg)  Height: 5' 4 (1.626 m)   Body mass index is 29.15 kg/m.  Wt Readings from Last 3 Encounters:  03/03/24 169 lb 12.8 oz (77 kg)  01/25/24 162 lb (73.5 kg)  11/12/23 165 lb 9.6 oz (75.1 kg)    The 10-year ASCVD risk score (Arnett DK, et al., 2019) is: 30.4%   Values used to calculate the score:     Age: 46 years     Clinically relevant sex: Female  Is Non-Hispanic African American: Yes     Diabetic: Yes     Tobacco smoker: No     Systolic Blood Pressure: 130 mmHg     Is BP treated: No     HDL Cholesterol: 75 mg/dL     Total Cholesterol: 171 mg/dL  Objective:  Physical Exam Vitals and nursing note reviewed.  Constitutional:      Appearance: Normal appearance.  HENT:     Head: Normocephalic and atraumatic.  Eyes:     Extraocular Movements: Extraocular movements intact.  Cardiovascular:     Rate and Rhythm: Normal rate and regular rhythm.     Heart sounds: Normal heart sounds.  Pulmonary:     Effort: Pulmonary effort is normal.     Breath sounds: Normal breath sounds.  Musculoskeletal:     Cervical back: Normal range of motion.  Skin:    General: Skin is warm.  Neurological:     General: No focal deficit present.     Mental Status: She is alert.  Psychiatric:        Mood and Affect: Mood normal.        Behavior: Behavior normal.         Assessment And Plan:   Assessment & Plan Mixed hyperlipidemia due to type 2 diabetes mellitus (HCC) Routine diabetes check with no acute issues. She will continue with Rybelsus  7mg  daily. - LDL goal is less than 70, continue with rosuvastatin . - Ordered A1c test. -  Scheduled cholesterol check for next morning appointment. Hypercalcemia Mild hypercalcemia with corrected calcium  level at 10.39 mg/dL. Previous sarcoidosis test normal. Kidney function normal. Considering rare causes of hypercalcemia. Potential extra calcium  in hair, skin, and nails supplement. - Will recheck calcium  level. - Reduce multivitamin intake to three days a week. - Consider stopping multivitamin if calcium  normalizes. - Send picture of hair, skin, and nails supplement for review. Estrogen deficiency She declines bone density.  Hair thinning Hair thinning. Potential iron deficiency considered. Discussed dietary modifications for hair health. - Ordered iron panel. - Consider beef or chicken bone broth for collagen intake. Chronic constipation Sluggish bowel movements. Previous use of Miralax  and Colace with limited effectiveness. Considering Linzess  as a treatment option. - Provided Linzess  samples if available. - Start Linzess  every other day to avoid diarrhea. - Will consider prescription if samples are unavailable. Lung nodule Pulmonary nodules with largest measuring 8 mm. Follow-up CT of the chest recommended in 3-6 months due to smoking history. Third CT optional but recommended for comprehensive monitoring. - Will schedule follow-up CT of the chest in 3-6 months. - Will review Oncology notes for further recommendations.    Orders Placed This Encounter  Procedures   Hemoglobin A1c   Iron, TIBC and Ferritin Panel     Return in 4 months (on 07/01/2024), or dm check, for cancel April 2025 appt with RS.  Patient was given opportunity to ask questions. Patient verbalized understanding of the plan and was able to repeat key elements of the plan. All questions were answered to their satisfaction.    I, [name], MD, have reviewed all documentation for this visit. The documentation on 03/07/24 for the exam, diagnosis, procedures, and orders are all accurate and  complete.   IF YOU HAVE BEEN REFERRED TO A SPECIALIST, IT MAY TAKE 1-2 WEEKS TO SCHEDULE/PROCESS THE REFERRAL. IF YOU HAVE NOT HEARD FROM US /SPECIALIST IN TWO WEEKS, PLEASE GIVE US  A CALL AT [phone].

## 2024-03-03 NOTE — Patient Instructions (Signed)

## 2024-03-04 LAB — IRON,TIBC AND FERRITIN PANEL
Ferritin: 57 ng/mL (ref 15–150)
Iron Saturation: 38 % (ref 15–55)
Iron: 123 ug/dL (ref 27–139)
Total Iron Binding Capacity: 321 ug/dL (ref 250–450)
UIBC: 198 ug/dL (ref 118–369)

## 2024-03-04 LAB — HEMOGLOBIN A1C
Est. average glucose Bld gHb Est-mCnc: 146 mg/dL
Hgb A1c MFr Bld: 6.7 % — ABNORMAL HIGH (ref 4.8–5.6)

## 2024-03-07 NOTE — Assessment & Plan Note (Signed)
 She declines bone density.

## 2024-03-07 NOTE — Assessment & Plan Note (Signed)
 Mild hypercalcemia with corrected calcium  level at 10.39 mg/dL. Previous sarcoidosis test normal. Kidney function normal. Considering rare causes of hypercalcemia. Potential extra calcium  in hair, skin, and nails supplement. - Will recheck calcium  level. - Reduce multivitamin intake to three days a week. - Consider stopping multivitamin if calcium  normalizes. - Send picture of hair, skin, and nails supplement for review.

## 2024-03-14 ENCOUNTER — Inpatient Hospital Stay: Payer: Medicare Other | Admitting: Adult Health

## 2024-03-14 ENCOUNTER — Other Ambulatory Visit: Payer: Medicare Other

## 2024-03-18 ENCOUNTER — Other Ambulatory Visit: Payer: Self-pay

## 2024-03-18 DIAGNOSIS — Z171 Estrogen receptor negative status [ER-]: Secondary | ICD-10-CM

## 2024-03-22 ENCOUNTER — Inpatient Hospital Stay

## 2024-03-22 ENCOUNTER — Inpatient Hospital Stay: Admitting: Adult Health

## 2024-03-22 ENCOUNTER — Encounter: Payer: Self-pay | Admitting: Adult Health

## 2024-03-22 VITALS — BP 155/65 | HR 81 | Temp 97.3°F | Resp 16 | Wt 169.7 lb

## 2024-03-22 DIAGNOSIS — Z171 Estrogen receptor negative status [ER-]: Secondary | ICD-10-CM

## 2024-03-22 DIAGNOSIS — C50111 Malignant neoplasm of central portion of right female breast: Secondary | ICD-10-CM

## 2024-03-22 DIAGNOSIS — R911 Solitary pulmonary nodule: Secondary | ICD-10-CM

## 2024-03-22 DIAGNOSIS — C50812 Malignant neoplasm of overlapping sites of left female breast: Secondary | ICD-10-CM | POA: Diagnosis not present

## 2024-03-22 DIAGNOSIS — Z170 Estrogen receptor positive status [ER+]: Secondary | ICD-10-CM

## 2024-03-22 LAB — CMP (CANCER CENTER ONLY)
ALT: 16 U/L (ref 0–44)
AST: 26 U/L (ref 15–41)
Albumin: 4.5 g/dL (ref 3.5–5.0)
Alkaline Phosphatase: 100 U/L (ref 38–126)
Anion gap: 11 (ref 5–15)
BUN: 9 mg/dL (ref 8–23)
CO2: 28 mmol/L (ref 22–32)
Calcium: 10.7 mg/dL — ABNORMAL HIGH (ref 8.9–10.3)
Chloride: 105 mmol/L (ref 98–111)
Creatinine: 0.6 mg/dL (ref 0.44–1.00)
GFR, Estimated: >60 mL/min
Glucose, Bld: 120 mg/dL — ABNORMAL HIGH (ref 70–99)
Potassium: 4.5 mmol/L (ref 3.5–5.1)
Sodium: 144 mmol/L (ref 135–145)
Total Bilirubin: 0.4 mg/dL (ref 0.0–1.2)
Total Protein: 7.1 g/dL (ref 6.5–8.1)

## 2024-03-22 LAB — CBC WITH DIFFERENTIAL (CANCER CENTER ONLY)
Abs Immature Granulocytes: 0 10*3/uL (ref 0.00–0.07)
Basophils Absolute: 0 10*3/uL (ref 0.0–0.1)
Basophils Relative: 1 %
Eosinophils Absolute: 0 10*3/uL (ref 0.0–0.5)
Eosinophils Relative: 1 %
HCT: 42.6 % (ref 36.0–46.0)
Hemoglobin: 14.6 g/dL (ref 12.0–15.0)
Immature Granulocytes: 0 %
Lymphocytes Relative: 39 %
Lymphs Abs: 1.6 10*3/uL (ref 0.7–4.0)
MCH: 30.8 pg (ref 26.0–34.0)
MCHC: 34.3 g/dL (ref 30.0–36.0)
MCV: 89.9 fL (ref 80.0–100.0)
Monocytes Absolute: 0.4 10*3/uL (ref 0.1–1.0)
Monocytes Relative: 9 %
Neutro Abs: 2.1 10*3/uL (ref 1.7–7.7)
Neutrophils Relative %: 50 %
Platelet Count: 273 10*3/uL (ref 150–400)
RBC: 4.74 MIL/uL (ref 3.87–5.11)
RDW: 12.4 % (ref 11.5–15.5)
WBC Count: 4.2 10*3/uL (ref 4.0–10.5)
nRBC: 0 % (ref 0.0–0.2)

## 2024-03-24 ENCOUNTER — Ambulatory Visit: Payer: Self-pay | Admitting: Internal Medicine

## 2024-03-29 ENCOUNTER — Inpatient Hospital Stay

## 2024-05-18 ENCOUNTER — Ambulatory Visit: Payer: Self-pay | Admitting: Internal Medicine

## 2024-05-18 ENCOUNTER — Ambulatory Visit: Payer: Medicare Other

## 2024-07-04 ENCOUNTER — Ambulatory Visit: Payer: Self-pay | Admitting: Internal Medicine

## 2024-09-20 ENCOUNTER — Inpatient Hospital Stay

## 2025-03-23 ENCOUNTER — Inpatient Hospital Stay: Admitting: Adult Health

## 2025-03-23 ENCOUNTER — Inpatient Hospital Stay
